# Patient Record
Sex: Male | Born: 1935
Health system: Southern US, Community
[De-identification: ages and names within clinical notes are randomized; demographics above are authoritative.]

## PROBLEM LIST (undated history)

## (undated) DIAGNOSIS — I2510 Atherosclerotic heart disease of native coronary artery without angina pectoris: Secondary | ICD-10-CM

## (undated) DIAGNOSIS — G4733 Obstructive sleep apnea (adult) (pediatric): Secondary | ICD-10-CM

## (undated) DIAGNOSIS — H3530 Unspecified macular degeneration: Secondary | ICD-10-CM

## (undated) DIAGNOSIS — S68119A Complete traumatic metacarpophalangeal amputation of unspecified finger, initial encounter: Secondary | ICD-10-CM

## (undated) DIAGNOSIS — S32409A Unspecified fracture of unspecified acetabulum, initial encounter for closed fracture: Secondary | ICD-10-CM

## (undated) DIAGNOSIS — H269 Unspecified cataract: Secondary | ICD-10-CM

## (undated) DIAGNOSIS — I10 Essential (primary) hypertension: Secondary | ICD-10-CM

## (undated) DIAGNOSIS — S329XXA Fracture of unspecified parts of lumbosacral spine and pelvis, initial encounter for closed fracture: Secondary | ICD-10-CM

## (undated) DIAGNOSIS — I4510 Unspecified right bundle-branch block: Secondary | ICD-10-CM

## (undated) DIAGNOSIS — C61 Malignant neoplasm of prostate: Secondary | ICD-10-CM

## (undated) DIAGNOSIS — E785 Hyperlipidemia, unspecified: Secondary | ICD-10-CM

## (undated) DIAGNOSIS — S060X9A Concussion with loss of consciousness of unspecified duration, initial encounter: Secondary | ICD-10-CM

## (undated) DIAGNOSIS — R911 Solitary pulmonary nodule: Secondary | ICD-10-CM

## (undated) DIAGNOSIS — S060XAA Concussion with loss of consciousness status unknown, initial encounter: Secondary | ICD-10-CM

## (undated) DIAGNOSIS — I4891 Unspecified atrial fibrillation: Secondary | ICD-10-CM

## (undated) HISTORY — DX: Unspecified macular degeneration: H35.30

## (undated) HISTORY — DX: Concussion with loss of consciousness status unknown, initial encounter: S06.0XAA

## (undated) HISTORY — PX: PROSTATECTOMY: SHX69

## (undated) HISTORY — DX: Complete traumatic metacarpophalangeal amputation of unspecified finger, initial encounter: S68.119A

## (undated) HISTORY — PX: TOOTH EXTRACTION: SUR596

## (undated) HISTORY — DX: Hyperlipidemia, unspecified: E78.5

## (undated) HISTORY — DX: Concussion with loss of consciousness of unspecified duration, initial encounter: S06.0X9A

## (undated) HISTORY — PX: OTHER SURGICAL HISTORY: SHX169

## (undated) HISTORY — DX: Malignant neoplasm of prostate: C61

## (undated) HISTORY — DX: Unspecified fracture of unspecified acetabulum, initial encounter for closed fracture: S32.409A

## (undated) HISTORY — DX: Unspecified right bundle-branch block: I45.10

## (undated) HISTORY — PX: CORONARY STENT PLACEMENT: SHX1402

## (undated) HISTORY — PX: ANTERIOR CRUCIATE LIGAMENT REPAIR: SHX115

## (undated) HISTORY — DX: Solitary pulmonary nodule: R91.1

## (undated) HISTORY — PX: TONSILLECTOMY: SUR1361

## (undated) HISTORY — DX: Obstructive sleep apnea (adult) (pediatric): G47.33

## (undated) HISTORY — DX: Unspecified cataract: H26.9

## (undated) HISTORY — DX: Fracture of unspecified parts of lumbosacral spine and pelvis, initial encounter for closed fracture: S32.9XXA

---

## 2001-09-11 ENCOUNTER — Ambulatory Visit (HOSPITAL_COMMUNITY): Admission: RE | Admit: 2001-09-11 | Discharge: 2001-09-12 | Payer: Self-pay | Admitting: Cardiology

## 2001-09-22 ENCOUNTER — Encounter (HOSPITAL_COMMUNITY): Admission: RE | Admit: 2001-09-22 | Discharge: 2001-12-21 | Payer: Self-pay | Admitting: Cardiology

## 2001-12-22 ENCOUNTER — Encounter (HOSPITAL_COMMUNITY): Admission: RE | Admit: 2001-12-22 | Discharge: 2002-01-11 | Payer: Self-pay | Admitting: Cardiology

## 2002-01-06 ENCOUNTER — Encounter (HOSPITAL_COMMUNITY): Admission: RE | Admit: 2002-01-06 | Discharge: 2002-04-06 | Payer: Self-pay | Admitting: Cardiology

## 2002-03-21 ENCOUNTER — Emergency Department (HOSPITAL_COMMUNITY): Admission: EM | Admit: 2002-03-21 | Discharge: 2002-03-21 | Payer: Self-pay | Admitting: *Deleted

## 2002-03-21 ENCOUNTER — Encounter: Payer: Self-pay | Admitting: *Deleted

## 2002-05-10 ENCOUNTER — Ambulatory Visit (HOSPITAL_COMMUNITY): Admission: RE | Admit: 2002-05-10 | Discharge: 2002-05-10 | Payer: Self-pay | Admitting: Gastroenterology

## 2002-12-06 ENCOUNTER — Encounter: Admission: RE | Admit: 2002-12-06 | Discharge: 2002-12-06 | Payer: Self-pay | Admitting: Orthopaedic Surgery

## 2002-12-06 ENCOUNTER — Encounter: Payer: Self-pay | Admitting: Orthopaedic Surgery

## 2002-12-07 ENCOUNTER — Ambulatory Visit (HOSPITAL_BASED_OUTPATIENT_CLINIC_OR_DEPARTMENT_OTHER): Admission: RE | Admit: 2002-12-07 | Discharge: 2002-12-07 | Payer: Self-pay | Admitting: Orthopaedic Surgery

## 2004-09-10 ENCOUNTER — Ambulatory Visit (HOSPITAL_COMMUNITY): Admission: RE | Admit: 2004-09-10 | Discharge: 2004-09-11 | Payer: Self-pay | Admitting: Cardiology

## 2004-09-18 ENCOUNTER — Encounter: Admission: RE | Admit: 2004-09-18 | Discharge: 2004-09-18 | Payer: Self-pay | Admitting: Cardiology

## 2005-01-11 ENCOUNTER — Emergency Department (HOSPITAL_COMMUNITY): Admission: EM | Admit: 2005-01-11 | Discharge: 2005-01-11 | Payer: Self-pay | Admitting: Emergency Medicine

## 2005-05-03 ENCOUNTER — Emergency Department (HOSPITAL_COMMUNITY): Admission: EM | Admit: 2005-05-03 | Discharge: 2005-05-03 | Payer: Self-pay | Admitting: Emergency Medicine

## 2005-05-28 ENCOUNTER — Ambulatory Visit (HOSPITAL_COMMUNITY): Admission: RE | Admit: 2005-05-28 | Discharge: 2005-05-29 | Payer: Self-pay | Admitting: Cardiology

## 2005-11-16 ENCOUNTER — Emergency Department (HOSPITAL_COMMUNITY): Admission: EM | Admit: 2005-11-16 | Discharge: 2005-11-16 | Payer: Self-pay | Admitting: Emergency Medicine

## 2007-04-09 ENCOUNTER — Ambulatory Visit (HOSPITAL_COMMUNITY): Admission: RE | Admit: 2007-04-09 | Discharge: 2007-04-10 | Payer: Self-pay | Admitting: Orthopaedic Surgery

## 2007-08-28 ENCOUNTER — Emergency Department (HOSPITAL_COMMUNITY): Admission: EM | Admit: 2007-08-28 | Discharge: 2007-08-28 | Payer: Self-pay | Admitting: Emergency Medicine

## 2007-08-31 ENCOUNTER — Encounter (HOSPITAL_COMMUNITY): Admission: RE | Admit: 2007-08-31 | Discharge: 2007-09-25 | Payer: Self-pay | Admitting: Emergency Medicine

## 2007-09-04 ENCOUNTER — Inpatient Hospital Stay (HOSPITAL_COMMUNITY): Admission: EM | Admit: 2007-09-04 | Discharge: 2007-09-17 | Payer: Self-pay | Admitting: Family Medicine

## 2007-09-12 ENCOUNTER — Ambulatory Visit: Payer: Self-pay | Admitting: Infectious Diseases

## 2007-10-14 ENCOUNTER — Inpatient Hospital Stay (HOSPITAL_COMMUNITY): Admission: RE | Admit: 2007-10-14 | Discharge: 2007-10-15 | Payer: Self-pay | Admitting: Orthopedic Surgery

## 2010-06-04 ENCOUNTER — Emergency Department (HOSPITAL_BASED_OUTPATIENT_CLINIC_OR_DEPARTMENT_OTHER)
Admission: EM | Admit: 2010-06-04 | Discharge: 2010-06-04 | Payer: Self-pay | Source: Home / Self Care | Admitting: Emergency Medicine

## 2010-10-30 NOTE — Op Note (Signed)
NAMEGABRIAN, Dominic [name] NO.:  [id number]   MEDICAL RECORD NO.:  [id number]          PATIENT TYPE:  INP   LOCATION:  5017                         FACILITY:  [facility]   PHYSICIAN:  Dionne Ano. Gramig III, M.D.DATE OF BIRTH:  [birth year]   DATE OF PROCEDURE:  09/04/2007  DATE OF DISCHARGE:                               OPERATIVE REPORT   PREOPERATIVE DIAGNOSIS:  Left index finger deep abscess with extensor  tendon sheath involvement.   POSTOPERATIVE DIAGNOSIS:  Left index finger deep abscess with extensor  tendon sheath involvement.   PROCEDURE:  1. I and D left index finger tendon sheath (extensor apparatus).  2. I and D deep abscess left index finger skin and subcutaneous      tissue, deep in location.   SURGEON:  Dionne Ano. Amanda Pea, M.D.   ASSISTANT:  None.   COMPLICATIONS:  None.   ANESTHESIA:  General.   TOURNIQUET TIME:  Less than 20 minutes.   CULTURES:  Times two.   INDICATIONS FOR PROCEDURE:  This patient is a 77 year old male who  sustained a raccoon bite 7 days ago.  It has continued to worsen in  terms of its infectious appearance.  He has been on Augmentin antibiotic  but has not returned to a quiescent state of affairs.  He is currently  undergoing a rabies series and is on the third series as per the  protocol.  He presents with an acutely infected finger and understands  the risks and benefits of surgery.  I have discussed with him all issues  in detail.   PROCEDURE IN DETAIL:  The patient was seen by myself and anesthesia,  time-out was called.  He was taken to the operative suite and underwent  a smooth induction of general anesthesia by Dr. Jairo Ben.  Following this he was prepped and draped in a sterile fashion with  Betadine scrub and paint.  Once this was performed the patient then  underwent I and D about a dorsal wound.  This was a deep abscess which  also involved the extensor apparatus,  I and D of the extensor apparatus  and extensor tendon sheath was accomplished without difficulty.  The  patient tolerated this well.  Following this the patient underwent I and  D of a separate volar ulnar type wound.  This was a deep abscess and I  and D'd this very meticulously without complicating feature.  Thus, two  separate bite wounds were I and D'd, both contained deep abscesses and  the dorsal ulnar one contained elements of the extensor sheath  apparatus.  I then irrigated with greater than 2-3 liters of saline and  packed the wounds open with Iodoform gauze.  The patient tolerated this  well.  He had excellent refill and tourniquet time was less than 20-30  minutes.  Cultures aerobic and anaerobic were taken.   We will monitor his condition closely.  We have begun IV Unasyn 3 grams  IV q.6 h. and will continue an aggressive approach to his infection.  We  will plan for daily whirlpools, IV antibiotics,  pain management and  close observatory status.  All questions have been encouraged and  answered.           ______________________________  Dionne Ano. Everlene Other, M.D.     Nash Mantis  D:  09/04/2007  T:  09/05/2007  Job:  [id number]

## 2010-10-30 NOTE — Op Note (Signed)
Dominic [name], [name] NO.:  [id number]   MEDICAL RECORD NO.:  [id number]          PATIENT TYPE:  INP   LOCATION:  5017                         FACILITY:  [facility]   PHYSICIAN:  Dionne Ano. Gramig III, M.D.DATE OF BIRTH:  04/05/1934   DATE OF PROCEDURE:  DATE OF DISCHARGE:                               OPERATIVE REPORT   PREOPERATIVE DIAGNOSIS:  Status post raccoon bite with severe infection  about the left index finger including the flexor and extensor tendon  sheaths, as well as the proximal interphalangeal joint.  Patient  presents for incision and drainage, repeat in nature.   POSTOPERATIVE DIAGNOSIS:  Status post raccoon bite with severe infection  about the left index finger including the flexor and extensor tendon  sheaths, as well as the proximal interphalangeal joint.  Patient  presents for incision and drainage, repeat in nature.   PROCEDURE:  I&D extensor and flexor tendon sheaths, as well as the PIP  joint with 6 liters of saline and loose closure.   SURGEON:  Dionne Ano. Amanda Pea, M.D.   ASSISTANT:  None.   COMPLICATIONS:  None.   ANESTHESIA:  General.   TOURNIQUET TIME:  Zero.   INDICATIONS FOR PROCEDURE:  This patient is a very pleasant gentleman  who has undergone multiple I&Ds for a raccoon bite.  This is well  detailed in his chart.  He presents for his fifth washout.  His prior  wound conditions have looked much improved, but he still has tremendous  soft tissue swelling.  I have discussed with the patient and his family  the risks, benefits of surgery, and my concern of stiffness, recurrent  infection, et Karie Soda.  With all issues in mind, we will proceed with I&D  and possible closure.   INTRAOPERATIVE FINDINGS:  This patient had stable conditions, although  he is quite swollen.  He had no purulent reaccumulation.  Over the last  two washouts, he has had no obvious purulent reaccumulation and thus he  was loosely closed over drains today.   He still remains significantly  swollen.   OPERATION IN DETAIL:  Patient seen by myself and anesthesia, taken to  the operative suite.  Underwent smooth induction of anesthesia in the  form of general anesthetic.  Permit was signed, time-out was called, and  all questions were encouraged and answered preoperatively.  Once in the  operative suite, he underwent thorough prep and drape with Betadine  scrub and paint about the left index finger and hand. A sterile field  secured.  Arm was isolated and he then underwent I&D of flexor tendon  sheath with tenosynovectomy of flexor tendon apparatus, as well as  extensor tendon tenosynovectomy.  Any nonviable, frayed tissue was  removed sharply with knife, blade and scissors.  The PIP joint was  entered and lavage.  I poured 6 liters of saline in the finger and noted  that the conditions were quite improved from prior exam.  I was pleased  with the findings and following this closed him loosely over four drip  Vesseloop drains.  He tolerated this well.  I have discussed with the patient the issues and my concerns.  His  condition looked overall stable today without any signs of purulence,  thus we loosely closed him.  However I would still give this guarded  prognosis in regards to finger stiffness, overall healing f infection,  et Karie Soda.  We are going to continue IV antibiotics, general observation  and discussed with ID possible p.o. antibiotic ridging into the future.  The patient and I have discussed these issues at length, __________, Dominic [name]. and all questions of course have been encouraged and answered.  It was a pleasure seeing him today.           ______________________________  Dionne Ano. Dominic [name], M.D.     Dominic [name]  D:  09/15/2007  T:  09/15/2007  Job:  [id number]

## 2010-10-30 NOTE — Op Note (Signed)
NAME:  Dominic [name], Dominic [name]                 ACCOUNT NO.:  [id number]   MEDICAL RECORD NO.:  [id number]          PATIENT TYPE:  AMB   LOCATION:  SDS                          FACILITY:  [facility]   PHYSICIAN:  Lubertha Basque. Dalldorf, M.D.DATE OF BIRTH:  [birth year]   DATE OF PROCEDURE:  04/09/2007  DATE OF DISCHARGE:                               OPERATIVE REPORT   PREOPERATIVE DIAGNOSIS:  Right knee anterior cruciate ligament tear.   POSTOPERATIVE DIAGNOSES:  1. Right knee anterior cruciate ligament tear.  2. Right knee torn lateral meniscus.   PROCEDURES:  1. Right ACL reconstruction.  2. Right partial lateral meniscectomy.   ANESTHESIA:  General and block.   ATTENDING SURGEON:  Lubertha Basque. Jerl Santos, M.D.   ASSISTANT:  Lindwood Qua, P.A.   INDICATIONS FOR PROCEDURE:  The patient is a very active 77 year old man  who works as an Retail banker.  He was injured on the job about a  year and a half ago and suffered an ACL rupture.  He has been treated  with bracing and physical therapy and some mild activity restriction,  but persists with giving-way episodes of the knee which affect his  ability to do his job and recreate.  He is offered a reconstruction at  this point.  Informed operative consent was obtained after discussion of  the possible complications of reaction to anesthesia, infection, and  DVT.  The patient also understood about the dedicated rehabilitation  protocol required after this type of procedure to optimize results.   SUMMARY OF FINDINGS AND PROCEDURE:  Under general anesthesia and a  block, a right knee procedure was performed.  At arthroscopy, he had  minimal degenerative change.  Patellofemoral joint appeared completely  benign.  The medial compartment showed no evidence of meniscal or  articular cartilage injury.  In terms of the lateral compartment, he  exhibited some grade II and III changes in a focal area of the lateral  femoral condyle, with a posterior horn  lateral meniscus tear addressed  with 5% partial lateral meniscectomy.  The ACL was completely torn, and  the PCL was intact.  We reconstructed the ACL using the middle third  patellar tendon allograft stabilized at both ends with 7 x 25 metal  Linvatec screws.  Bryna Colander assisted throughout and was invaluable  to completion of the case in that he helped position and retract while I  performed the procedure.  He also fashioned the allograft on the back  table while I performed the arthroscopic portions of the case, thereby  significantly minimizing O.R. time.   DESCRIPTION OF THE PROCEDURE:  The patient was taken to the operating  suite, where general anesthetic was applied without difficulty.  He was  also given a block in the preanesthesia area.  He was positioned supine  and prepped and draped in the normal sterile fashion.  After  administration of preoperative IV Kefzol, an arthroscopy of the right  knee was formed through two portals.  Findings were as noted above, and  the procedure consisted initially of the partial lateral meniscectomy  done with basket  and shavers back to stable tissues.  About 5% of the  lateral meniscus was removed in the process.  The ACL stump was removed,  followed by a conservative notchplasty performed with a bur and shaver.  The over-the-top position was well visualized.  We then placed a guide  into the knee and made a small anteromedial incision.  Utilizing this  guide, we placed a guidewire up into the knee just anterior to the PCL.  I overreamed this to diameter of 11 mm.  We then placed a second guide  through the tibial tunnel in the over-the-top position.  Utilizing this  guide, I passed the guidewire through the tibial tunnel, through the  femur, and out the proximal thigh.  I overreamed this to a diameter of  10 mm and a depth of 3 cm, with care taken to preserve a 1 or 2 mm  posterior wall, which was well visualized.  Bony debris was  removed from  the knee.  An allograft was defrosted fully on the back table and  tensioned.  This was contoured to fit through 9 and 10 mm tunnels.  A  drill hole was placed in one of these bone plugs with a wire suture  passed here.  In the other bone plug, we passed a suture through a drill  hole.  This graft was then passed through the knee through the tibial  tunnel and up into the femur.  Care was taken to keep the tendinous  surface of the graft in a posterior direction as the graft entered the  femoral tunnel.  I then placed the guidewire in an anterior position in  the femoral tunnel through the medial arthroscopic portal.  Over this, I  passed a 7 x 25 metal Linvatec screw, securing the leading bone plug in  the femoral tunnel.  I placed traction on the trailing bone plug.  It  could not dislodge the leading bone plug.  The knee ranged full, and the  graft was felt be very isometric.  The guide pin was then placed through  the tibial tunnel and seemed to enter the knee arthroscopically.  Over  this, I passed an identical interference screw to secure the trailing  bone plug in the tibia.  The knee again ranged fully.  His loose  Lachman's test with eliminated with a nice endpoint and good stability  on exam.  Arthroscopic equipment was removed.  We reapproximated the  small anterior incision with nylon.  Adaptic was applied over all the  incisions and portals and followed by dry gauze with loose Ace wrap.  Estimated blood loss and intraoperative fluids can be obtained from the  anesthesia records.  No tourniquet was utilized.   DISPOSITION:  The patient was extubated in the operating room and taken  to the recovery room in stable addition.  He was admitted for overnight  observation for pain control and probable discharge home the morning.      Lubertha Basque Jerl Santos, M.D.  Electronically Signed     PGD/MEDQ  D:  04/09/2007  T:  04/10/2007  Job:  [id number]

## 2010-10-30 NOTE — Consult Note (Signed)
NAME:  Dominic [name], Dominic [name] NO.:  [id number]   MEDICAL RECORD NO.:  [id number]          PATIENT TYPE:  INP   LOCATION:  5017                         FACILITY:  [facility]   PHYSICIAN:  Lyn Records, M.D.   DATE OF BIRTH:  07/27/1933   DATE OF CONSULTATION:  DATE OF DISCHARGE:                                 CONSULTATION   REASON FOR CONSULTATION:  Bleeding from left hand wound secondary to  dual antiplatelet therapy.   CONCLUSION/COMMENTS:  The patient is 77 years of age and has a history  of coronary artery disease.  He underwent recanalization of the totally  occluded right coronary in March 2003 and had overlapping bare-metal  stents placed in the mid portion of this vessel.  He has had restenosis  x2.  The first episode of restenosis was treated with a Cypher drug-  eluting stent in March 2006.  The second episode of restenosis was  treated with a Taxus drug-eluting stent in December 2006.  The patient  is asymptomatic with reference to any recurrent anginal symptoms.  He  has been maintained on chronic dual antiplatelet therapy because of the  long segment of the stent and the  triple layer of stents in the mid  right coronary, two of which are DES.  This places the patient at  increased risk for late stent thrombosis.  The patient did have an  episode of Plavix and aspirin  cessation for a right knee procedure in  October 2008 without complications.  He currently complains of no  cardiac symptoms.   The patient was admitted on September 04, 2007, with an infected left hand  following a bite by a rabbit raccoon.  He has undergone I&D by Dr.  Lestine Box and will require high-pressure irrigation.  This has caused  continued oozing and saturation of bandages likely secondary to the  antiplatelet effects with his dual antiplatelet therapy.   RECOMMENDATIONS:  1. Discontinue Plavix.  Resume Plavix after hemostasis and some      healing has occurred and the need for  irrigation has decreased.  2. If possible, would prefer that we continue baby aspirin 81 mg per      day; however, if this is not possible, this would also be      something that could be discontinued.  I will leave this  to Dr.      Ashok Norris discretion.  3. I will inform Dr. Mayford Knife of the patient's present.      Lyn Records, M.D.  Electronically Signed     HWS/MEDQ  D:  09/05/2007  T:  09/05/2007  Job:  [id number]   cc:   Armanda Magic, M.D.  Dionne Ano. Everlene Other, M.D.  Vianne Bulls, M.D.

## 2010-10-30 NOTE — Op Note (Signed)
Dominic [name], KRUMHOLZ NO.:  [id number]   MEDICAL RECORD NO.:  [id number]          PATIENT TYPE:  INP   LOCATION:  5017                         FACILITY:  [facility]   PHYSICIAN:  Dionne Ano. Gramig III, M.D.DATE OF BIRTH:  [birth year]   DATE OF PROCEDURE:  09/07/2007  DATE OF DISCHARGE:                               OPERATIVE REPORT   PREOPERATIVE DIAGNOSIS:  Status post raccoon bite with significant deep  sepsis.  The patient presents for repeat washout and irrigation and  debridement.   POSTOPERATIVE DIAGNOSIS:  Status post raccoon bite with significant deep  sepsis.  The patient presents for repeat washout and irrigation and  debridement.   PROCEDURE:  1. Irrigation and debridement, and arthrotomy proximal interphalangeal      joint with synovectomy and decompression.  2. Irrigation and debridement, flexor and extensor sheaths with      tenosynovectomy and debridement.  3. Irrigation and debridement, deep abscess skin, subcutaneous tissue      and ulnar digital nerve vicinity.   SURGEON:  Dionne Ano. Amanda Pea, M.D.   ASSISTANT:  None.   COMPLICATIONS:  None.   DRAINS:  Three.   TOURNIQUET TIME:  Less than 30 minutes.   CULTURES:  Aerobic and anaerobic taken.   INDICATIONS FOR PROCEDURE:  This patient is a 77 year old male who was  bitten by a raccoon.  He was I&D'd 48 to 72 hours ago.  He has continued  to have continued swelling and due to this as well as his wound  conditions.  I feel that repeat I&D as appropriate.  This was a raccoon  bite with delayed presentation.  He has been on Unasyn and was I&D'd  previously.  He still has reaccumulation of purulence and due to the  aggressiveness of the infection, I would recommend a very aggressive  approach in trying to get him to quiescence.  I have discussed with he  and his family do's and don't's, etc. and all questions have been  encouraged and answered.   OPERATIVE PROCEDURE:  The patient seen by  myself and anesthesia.  Arm  was marked, permit signed, time-out was called and he was taken to the  operative suite and underwent a smooth induction of anesthesia in the  form of general anesthetic.  He was placed supine, appropriately padded,  prepped and draped in usual sterile fashion with Betadine scrub and  paint.  Once this done, previous ulnar incision was opened.  The patient  went immediate tenolysis and tenosynovectomy of the extensor apparatus.  He had reaccumulation of purulence here in the sheath infection.   Following this I then turned attention towards the PIP joint.  I opened  this and performed a capsulectomy and synovectomy.  There was purulent  encroachment into this region and this was aggressively treated with  I&D, synovectomy and lavage.  Following this I then I&D'd a separate  area of deep abscess about the ulnar digital nerve vicinity.  The ulnar  digital nerve underwent a neurolysis and was very carefully identified  and swept out of harm's way.  This  was I&D'd at length in an aggressive  fashion.   Following this I then identified the flexor sheath, I opened this and  did not see any significant purulence in the sheath but given his  failure to respond to the initial I&D's perfectly, I wanted to check  this.   Following this, I irrigated with greater than 4 liters of solution, the  last liter being an antibiotic irrigant.  The patient tolerated this  well.  Hemostasis was satisfactory and the tourniquet was inflated for a  brief period of time (less than 30 minutes.  The drain was packed with  Iodoform gauze.  The patient had good refill and was dressed in a  sterile bandage.  Neosporin, Adaptic, drains and sterile dressing were  applied.  There no complicating features.  He was taken to recovery room  in stable condition.  We will continue general postop observation,  vancomycin, Unasyn, per protocol and an aggressive approach to his  infection.  He has  significant injury and hopefully will respond to the  above-mentioned treatment.  We need to maintain a diligent approach in  my opinion.  I have discussed this with him and his wife and all  questions of course have been encouraged and answered.           ______________________________  Dionne Ano. Everlene Other, M.D.     Nash Mantis  D:  09/07/2007  T:  09/08/2007  Job:  [id number]

## 2010-10-30 NOTE — Op Note (Signed)
NAMEJAYMAR, Dominic [name] NO.:  [id number]   MEDICAL RECORD NO.:  [id number]          PATIENT TYPE:  OIB   LOCATION:  0098                         FACILITY:  [facility]   PHYSICIAN:  Dionne Ano. Gramig III, M.D.DATE OF BIRTH:  1933/11/15   DATE OF PROCEDURE:  10/13/2007  DATE OF DISCHARGE:                               OPERATIVE REPORT   PREOPERATIVE DIAGNOSES:  Dysfunctional left index finger, status post  rabid raccoon bite with multiple irrigations and debridements and repair  reconstruction.  The patient presents for ray amputation due to  dysfunction in his finger.   POSTOPERATIVE DIAGNOSES:  Dysfunctional left index finger, status post  rabid raccoon bite with multiple irrigations and debridements and repair  reconstruction.  The patient presents for ray amputation due to  dysfunction in his finger.   PROCEDURE:  1. Ray amputation left index finger at the metacarpal level.  2. Rotation flap left hand for coverage.  3. Coaptation/nerve repair with burying technique radial and ulnar      digital nerve to the index finger.  These were sutured with nylon      suture and buried in muscle against each other to form an      anastomotic loop   SURGEON:  Dionne Ano. Amanda Pea, M.D.   ASSISTANT:  None.   COMPLICATION:  None.   ANESTHESIA:  General.   TOURNIQUET TIME:  Less than an hour.   INDICATIONS FOR PROCEDURE:  The patient is a very pleasant 77 year old  male who unfortunately sustained a rabid raccoon bite with subsequent  need for I&D repair reconstruction as noted in his chart.  The patient  presented to me 7 days out from his injury.  His dog lost his leg, and  Dominic [name] of course was trying to break up a fight between the dog and a  very aggressive supposed rabid [place].  He has completed multiple I&Ds.  He does not have active__________ infection, but he has gone on to  develop a very dysfunctional finger with poor range of motion,  chondrolysis of the PIP  joint and disarray in general.  I have discussed  with him the options for treatment, and he desires to proceed with ray  amputation to the index finger.  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  With all  issues, risks and benefits in mind, he desires to proceed.   OPERATION IN DETAIL:  The patient was seen by myself and Anesthesia.  He  was taken to the operative suite, consented.  The arm was marked.  He  was given preoperative vancomycin and following this taken to the  procedure suite.  He underwent careful prep and drape x2, followed by  insufflation of the tourniquet after general anesthetic was employed,  and he was prepped and draped of course.  Once this was done, the  patient had outline marks made for a rotation flap.  I then incised the  skin,  dissected down and identified the area in question.  All scar  tissue had a 3-5 mm rim taken sharply with knife blade from it.  In the  volar dissection, I dissected the radial and ulnar neurovascular  bundles, tagged these structures and identified the flexor tendon  apparatus.  The flexor tendon apparatus was pulled well distally,  severed and allowed to retract proximally.  I then amputated the finger  at the metacarpal level.  This was the mid metacarpal level, and I  performed an oscillating saw cut for the ray, beveled for cosmesis  purposes.  I very carefully preserved the adductor pollicis and removed  portions of the first dorsal interosseous.  The patient tolerated this  well.  Following this, the extensor apparatus was severed.  Coagulation  was obtained with bipolar cautery.  Once this done, I then deflated the  tourniquet and irrigated with greater than 3 liters of solution.  This  completed the ray amputation portion of the procedure.  I did not send  this for specimen and saw no active infection,  just a very dysfunctional  finger with chondrolysis of the PIP joint and marked attenuation of the  flexor apparatus in terms of competency due to scar tissue and edema.   Following this, I then performed burying of the radial and ulnar nerves  in the muscle.  It was placed under the adductor, and I crushed the ends  gently and then sutured them together to perform an anastomotic loop  under 4.0 loupe magnification.  This anastomotic loop was buried  underneath the muscular tissue.   Following this, I then rechecked the intermetacarpal ligament and  sutured this to the associated fascia about the interosseous with Vicryl  suture.  I then performed rotation flap and inset the flap, making sure  the patient had an excellent recreated web space.  Both volar and dorsal  limbs of the rotation flap were inset nicely and there no complicating  features.  Following this, I then placed a Hemovac drain and sterile  dressing followed by a volar plaster splint.  The patient tolerated this  well.  Bleeding was secured nicely and there no complicating features.  Twenty mL of Sensorcaine without epinephrine was used for postop  analgesia.  The patient was taken to the recovery room.  He will be on  IV antibiotics in the form of vancomycin.  Will ask for elevation,  gentle interval vascular checks and all full precautions.  I have  discussed with him the relevant do's and don'ts, etc..  We will plan for  a compressive dressing for 10-14 days, followed by wound check in the  office once he is discharged of course.  All questions have been  aggressive.  It was a pleasure to see him today.           ______________________________  Dionne Ano. Everlene Other, M.D.     Nash Mantis  D:  10/13/2007  T:  10/13/2007  Job:  [id number]

## 2010-10-30 NOTE — Op Note (Signed)
NAMEARRINGTON, YOHE NO.:  [id number]   MEDICAL RECORD NO.:  [id number]          PATIENT TYPE:  INP   LOCATION:  5017                         FACILITY:  [facility]   PHYSICIAN:  Dionne Ano. Gramig III, M.D.DATE OF BIRTH:  1933/10/10   DATE OF PROCEDURE:  09/12/2007  DATE OF DISCHARGE:                               OPERATIVE REPORT   PREOPERATIVE DIAGNOSIS:  Infection left index finger secondary to  raccoon bite, with continued ongoing infectious process.  Presents for  repeat irrigation and debridement.   POSTOPERATIVE DIAGNOSIS:  Infection left index finger secondary to  raccoon bite, with continued ongoing infectious process.  Presents for  repeat irrigation and debridement.   PROCEDURES:  1. Irrigation and debridement flexor tendon sheath, extensive in      nature, with tenosynovectomy.  2. Irrigation and debridement proximal interphalangeal joint left      index finger.  3. Irrigation and debridement extensor tendon sheath, extensive in      nature, left index finger.   SURGEON:  Dionne Ano. Amanda Pea, M.D.   ASSISTANT:  None.   COMPLICATIONS:  None.   ANESTHESIA:  General.   TOURNIQUET TIME:  Less than 20 minutes.   INDICATIONS FOR THE PROCEDURE:  This patient is a 77 year old male who  presents with the above-mentioned diagnosis.  He is had previous  multiple I&Ds and has been on Unasyn and vancomycin and has had  aggressive treatment in regards to his upper extremity predicament.  He  unfortunately still continued to have a very significant infectious  process despite all aggressive efforts, including aggressive  antibiotics, whirlpools, and multiple I&D.  I have discussed with the  patient his findings.  The patient's original injury occurred as he was  breaking up a fight between a raccoon and his [place].  His [place]  ended up losing its leg and hindquarter.  In addition to this, the  patient has had a very difficult time clearing his infection  of course.  I have discussed with them that an aggressive purchase is still  warranted, and I would absolutely recommend very aggressive measures.  He presents for repeat I&D, understanding and accepting the risks and  benefits surgery and understands that amputation is an option if he  fails to make any progress.   I have gone over all issues with him and the family.  They are well  aware of my concerns and the guarded prognosis.   OPERATIVE PROCEDURE:  The patient was seen by myself and anesthesia.  He  was taken to the operative suite, time-out was called, and permit  signed.  Questions were encouraged and answered.  He was satisfactorily  padded and underwent a general anesthetic.  The patient had the arm  prepped and draped.  Following this, I performed an I&D of the area in  question and then made a proximal incision, as it appears the infection  had been ascending somewhat.  I opened up the volar aspect of the finger  down to the A1 pulley, and skin flaps were elevated.  I took great care  to  preserve the radial neurovascular bundle.  The ulnar digital nerve  underwent a neurolysis, and the ulnar digital artery had a thrombosis in  it from the infection which of course presented to my service 7 days  into the process.  The patient had no advanced purulence ascending into  the sheath of the palm, but did have exuberant flexor tenosynovitis.  Any nonviable prenecrotic tissue was debrided sharply.  I performed an  irrigation and debridement of the flexor tendon sheath, with greater  than 3 liters of fluid.  Following this, I turned towards the PIP joint  and performed an arthrotomy with once again I&D (irrigation and  debridement) of the proximal interphalangeal joint.  This was an  excisional debridement.  Following this, I&D of the extensor tendon  apparatus was accomplished.  This was an irrigation and debridement of  the extensor tendon sheath.  I did not see advanced  accumulation of a  purulence, and I did see marked swelling and tenosynovitis about the  tissues as well as synovitis about the joint.  All of these tissues were  cultured for aerobic, anaerobic, atypical, and fungal species.  An  additional 3-4 liters of saline was passed through the finger and, in  all, approximately 7 liters were placed.  The patient tolerated this  well, and there were no complicating features.   Following the I&D, I then packed the wound open with saline-soaked  gauze.  The patient tolerated this well.  The saline-soaked gauze was  followed by Neosporin, Adaptic, Xeroform, gauze, Webril, and a volar  plaster splint.  He had adequate refill, and no complicating features.   We will continue an aggressive course of care.  I have discussed with  him the dos and don'ts, etc., and had very long conversations with him  and his family in regards to the upper extremity predicament.  It was a  pleasure to participate in his care.  We will continue ongoing  aggressive efforts.  However, at the present time, I would give him a  very guarded prognosis.  It appears that this may have been a rabid  raccoon, given the aggressiveness of the attack on his dog and the very  poor response to treatment to date, despite very aggressive efforts.  We  will continue ongoing aggressive efforts.  The patient understands this.  I have consulted Dr. Lina Sayre from infectious disease in regards to  the infection as well, I should note.           ______________________________  Dionne Ano. Everlene Other, M.D.     Nash Mantis  D:  09/13/2007  T:  09/14/2007  Job:  [id number]

## 2010-10-30 NOTE — Op Note (Signed)
Dominic [name], ARMITAGE NO.:  [id number]   MEDICAL RECORD NO.:  [id number]          PATIENT TYPE:  INP   LOCATION:  5017                         FACILITY:  [facility]   PHYSICIAN:  Dionne Ano. Gramig III, M.D.DATE OF BIRTH:  1933/10/24   DATE OF PROCEDURE:  DATE OF DISCHARGE:                               OPERATIVE REPORT   PREOPERATIVE DIAGNOSIS:  Status post raccoon bite of left hand index  finger with previous multiple irrigations and debridements.  The patient  presents for repeat irrigation and debridement and washout.   POSTOPERATIVE DIAGNOSIS:  Status post raccoon bite of left hand index  finger with previous multiple irrigations and debridements.  The patient  presents for repeat irrigation and debridement and washout.   PROCEDURES:  1. Irrigation and debridement, left index finger extensor tendon      apparatus.  2. Incision and debridement, left proximal interphalangeal joint.  3. Incision and debridement, flexor tendon sheath including the flexor      digitorum profundus and flexor digitorum superficialis tendons,      with tenolysis.   SURGEON:  Dionne Ano. Amanda Pea, M.D.   ASSISTANT:  None.   COMPLICATIONS:  None.   ANESTHESIA:  General.   TOURNIQUET TIME:  Zero.   DRAINS:  The patient was packed open.   INDICATIONS FOR PROCEDURE:  The patient is a 78 year old male who has  undergone previous multiple I&D's of his infectious process.  He  presented 7 days into his injury with a raccoon bite.  He has undergone  I&D's over the previous week and presents for a repeat evaluation and  I&D, understanding and accepting the risks and benefits of surgery.  His  prognosis is guarded given the very severe significance of his  infection.  He understands this.   OPERATIVE PROCEDURE IN DETAIL:  The patient was seen by myself and  anesthesia.  He was taken to the operating room and underwent smooth  induction of general anesthesia.  Following this, he was  prepped and  draped in the usual sterile fashion with Betadine scrub and paint.  A  time-out was called, the permit was signed, all questions were  encouraged answered, of course.  I&D of skin, subcutaneous tissue and  extensor tendon sheath apparatus was accomplished.  Any nonviable,  prenecrotic tissue was removed.  Following this the PIP joint was  entered and similar irrigation and debridement of nonviable prenecrotic  tissue was accomplished.  I then placed 3 L through this area of saline.  Following this, I turned attention towards the flexor tendon apparatus,  which was previously opened, and the FDP and FDS tendons underwent a  tenolysis, tenosynovectomy retraction check.  The A2 pulley was  preserved, of course, and 3 L of saline was flushed through this region.  The ulnar digital nerve was intact, the radial neurovascular bundle was  intact and the ulnar artery was previously thrombosed from his  infectious sequelae.  I then placed another 2 L of saline through the  area and packed the area open.   Overall, the patient had improved conditions; however,  I would still  give him a guarded prognosis.  We are going to watch closely, see how he  does over the next 48 hours, and return him to the operative suite for a  repeat I&D, possible loose closure, possible open intervention depending  on the infectious sequelae.  He is currently on vancomycin and Zosyn.  He has an aggressive approach planned towards his infection, as he has  had all along over the last 8 days of his hospitalization.  However, at  present time I would zero give him a guarded prognosis in regards to his  injury.   It is an absolute pleasure to see him today.  I have discussed all  issues with him and his wife and will continue aggressive efforts.           ______________________________  Dionne Ano. Everlene Other, M.D.     Nash Mantis  D:  09/13/2007  T:  09/14/2007  Job:  [id number]

## 2010-11-02 NOTE — Discharge Summary (Signed)
[name], HUSTED NO.:  [id number]   MEDICAL RECORD NO.:  [id number]          PATIENT TYPE:  INP   LOCATION:  5017                         FACILITY:  [facility]   PHYSICIAN:  Karie Chimera, P.A.-C.DATE OF BIRTH:  11-24-1933   DATE OF ADMISSION:  09/04/2007  DATE OF DISCHARGE:  09/17/2007                               DISCHARGE SUMMARY   ADMITTING DIAGNOSES:  1. Left index finger infection secondary to probable rabid raccoon      bite.  2. History of coronary artery disease with stent placement and chronic      anticoagulation.  3. History of hyperlipidemia.  4. History of hypertension.  5. History of prostate cancer.   DISCHARGE DIAGNOSES:  1. Left index finger infection secondary to probable rabid raccoon      bite, guarded but improved.  2. History of coronary artery disease with stent placement and chronic      anticoagulation.  3. History of hyperlipidemia.  4. History of hypertension.  5. History of prostate cancer.   SURGEON:  Dionne Ano. Gramig, MD.   PROCEDURES:  Incision and drainage of the skin, subcutaneous tissue both  volar and dorsal as well about the flexor sheath left index finger for a  total of 5 and separate Incision and drainage performed.   CONSULTS:  Cardiology, Dr. Armanda Magic as well as Infectious Disease,  Dr. Maurice March.   BRIEF HISTORY OF PRESENT ILLNESS:  Mr. Dominic [name] is a very pleasant 77-year-  old gentleman who presented to the [facility] Emergency Room for  worsening swelling, erythema, pain, and drainage about the left index  finger.  He sustained a raccoon bite to the left index finger  approximately 1 week ago while trying to stop the raccoon from attacking  his anterior toe.  He initially saw his primary care physician who  started him on antibiotic regime of Augmentin 875 b.i.d., was  subsequently seen 3 additional times with worsening findings about the  finger in terms of swelling, erythema, and signs of ascending  cellulitis, presents to the emergency room concerning for evaluation and  upper extremity surgery.  Surgeon [name] was consulted for  evaluation and take over care.  The patient was noted to have  significant erythema, swelling, and drainage about the left index finger  consistent with a deep abscess/infective process.  The patient had  previously started the rabies vaccination by his primary care physician  given the poor response to p.o. antibiotics and worsening symptoms,  decision made to her urgently take him to the OR for I&D of the finger  and hand as necessary.  All issues were discussed with the patient in  terms of his infective process.  Certainly this had been somewhat  resistant from the initial injury and thus his state of his index finger  was certainly guarded.  Given his history of heart disease and chronic  anticoagulation, Dr. Norris Cross office was contacted for medical clearance  prior to surgical intervention.  Preoperative laboratory data was  obtained.  He was stable per Cardiology.  His EKG showed sinus  bradycardia, right bundle branch block, no significant change since last  tracing was noted.  No obvious bony abnormalities were noted about the  radiographs of the index finger.  The patient was noted to be stable and  blood pressure 154/89, pulse 57, respirations 18, and temperature 98.  After thorough and thoughtful discussion with the patient, he was  admitted to undergo operative intervention for the finger as well as IV  antibiotic implementation.   HOSPITAL COURSE:  Mr. [name] was admitted on September 04, 2007, and had a  lengthy hospital stay until 09/17/2007 given extremely poor response to  IV antibiotics and repeated I&D totaling 5 formal separate I&D's about  the index finger.  His I&D's were without complications.  Please see  operative report for full details.  Intraoperative cultures were  obtained with no organisms, shown certainly these were  most likely  skewed given his 1 week prior use of p.o. antibiotics.  The patient did  fairly well postoperatively without complications.  Cardiology followed  him along closely and his Plavix and aspirin was stopped throughout his  hospital stay.  The patient was closely watched in terms of his wound.  He had waxing and waning of improvement about the index finger in terms  of erythema, drainage, and signs of infection, and again underwent five  separate I&D's while inpatient.  He was initially started on IV Unasyn;  however, given the poor wound response, he was switched to vancomycin  for prophylactic coverage.  Infectious Disease was consulted who agreed  with the of vancomycin and Unasyn coverage.  They followed along closely  with the patient.  Cultures showed no growth.  The patient underwent  daily wound care in the form of hydrotherapy and packing.  He did have  some slight improvement towards the last of his stay and on the day this  discharge he was noted to be fairly improved overall.  He still had  swelling, but no signs of reaccumulating deep abscess.  His wound had no  purulent discharge.  He had no ascending erythema.  His vital signs  remained stable.  He had intermittent fevers with a T-max being 100.8  during his hospital stay.  Laboratory data was monitored carefully.  He  was noted to have a stable WBC count of 7.1.  He did have a slight drop  in his hematocrit at 35.8.  Per his description, did have a history of  chronic anemia.  Per Infectious Disease recommendation, given the  delayed response to IV antibiotics and multiple I&D, recommended  outpatient IV vancomycin.  The PICC line was placed inpatient without  difficulties and he tolerated this well.  On 09/17/2007, the patient's  wound had improved overall and the decision was made to discharge him  home.  He will of course continue outpatient IV vancomycin for his PICC  line advanced.  Home care was contacted for  wound care services as well  as vancomycin placement.  We discussed at length with Mr. Collymore upon  discharge, the somewhat guarded nature given the resistance to IV Unasyn  and vancomycin the infective process had shown to this point.  In  addition we discussed with him the potential need for amputation in the  form of a ray amputation with the infective process.  Given the fact  that after a significant infective process, he may certainly be left  with a stiff painful index finger.  All questions were encouraged and  answered and decision made  to discharge him home.  Unasyn was replaced  with Zosyn per Infectious Disease for more complete coverage.   ASSESSMENT/FINAL DIAGNOSES:  1. Incision and drainage x5 secondary to left index finger infections      status post raccoon bite.  2. History of coronary artery disease with stent placement and chronic      anticoagulation.  3. History of hyperlipidemia.  4. History of hypertension.  5. History of prostate cancer.   PLAN/CONDITION ON DISCHARGE:  Improved, but guarded.   DIET:  Regular.   ACTIVITIES:  1. He will undergo daily wound care.  2. He will resume his Plavix and aspirin on Saturday.  3. Wound care and home therapy and dressing change will be performed      per [facility] home [facility].  He will need IV vancomycin outpatiently      dose as determined by pharmacy equaling to 2000 mg IV q. 24.  4. He will need to keep his wound clean, dry, intact, elevate      frequently.   DISCHARGE MEDICATIONS:  1  His regular home medications resumed.  1. Percocet 1-2 q.4-6 p.r.n. pain.  2. Peri-Colace 100 mg p.o. b.i.d.  3. He will follow up with Dr. Amanda Pea on 09/21/2007.  He will call      [phone] if he has any questions or concerns.      Karie Chimera, P.A.-C.     BB/MEDQ  D:  10/22/2007  T:  10/23/2007  Job:  [id number]

## 2010-11-02 NOTE — Cardiovascular Report (Signed)
NAME:  Dominic [name], Dominic [name] NO.:  [id number]   MEDICAL RECORD NO.:  [id number]          PATIENT TYPE:  OIB   LOCATION:  6532                         FACILITY:  [facility]   PHYSICIAN:  Lyn Records III, M.D.DATE OF BIRTH:  [birth year]   DATE OF PROCEDURE:  09/10/2004  DATE OF DISCHARGE:                              CARDIAC CATHETERIZATION   INDICATIONS:  In-stent restenosis, abnormal Cardiolite, recurring chest  pressure.   PROCEDURE PERFORMED:  Angioplasty and overlapping drug-eluting stent  implantation throughout a region of diffuse in-stent restenosis.   DESCRIPTION:  After informed consent, the sheath already placed by Dr.  Mayford Knife during the diagnostic procedure was used. It was a 6-French sheath.  Patient was given 75 units/kg of IV heparin and an ACT was documented to be  greater than 300.  We then used an Asahi medium wire and had minimal  difficulty crossing a long area of restenosis within the right coronary.  We  then gave a double bolus followed by an infusion of Integrilin, 300 mg of  oral Plavix and performed balloon angioplasty using a noncompliant 3.0 mm  Quantum balloon throughout the mid portion of the previously stented region  which was approximately 58 mm based on the prior records.  After adequately  preparing the vessel with pre dilatation we deployed a 33 mm long x 3.0 mm  diameter CYPHER stent that extended from the beginning of the distal portion  of the right coronary to the mid right coronary.  Two balloon inflations up  to 14 atmospheres were performed.  We then overlapped this more proximally  with a 3.5 x 33 mm CYPHER stent overlapping in the mid vessel to the  proximal vessel proximal prior stent margin.  Two balloon inflations were  performed to 14 atmospheres with brisk TIMI grade 3 flow noted and a very  nice angiographic result.   AngioSeal arteriotomy closure was performed after angiographic demonstration  of adequate access  entry.   CONCLUSION:  1.  Diffuse in-stent restenosis treated with redilatation and overlapping 33      mm long CYPHER drug-eluting stents from the distal vessel back to the      proximal vessel overlapping and extending beyond the prior stent      margins.  There was reduction of stenosis from 100% with TIMI grade 0      flow to 0% with TIMI grade 3 flow.  2.  Successful Angio-Seal arteriotomy closure.   PLAN:  Aspirin.  Plavix for at least 12 months.  I would probably let the  patient remain on Plavix indefinitely, but definitely longer than 12 months.  Further management per Dr. Mayford Knife.      HWS/MEDQ  D:  09/10/2004  T:  09/10/2004  Job:  [id number]   cc:   A. Tenny Craw, M.D.

## 2010-11-02 NOTE — Cardiovascular Report (Signed)
NAMEDEJAUN, Dominic [name] NO.:  [id number]   MEDICAL RECORD NO.:  [id number]          PATIENT TYPE:  OIB   LOCATION:  2899                         FACILITY:  [facility]   PHYSICIAN:  Armanda Magic, M.D.     DATE OF BIRTH:  Aug 07, 1933   DATE OF PROCEDURE:  09/10/2004  DATE OF DISCHARGE:                              CARDIAC CATHETERIZATION   REFERRING PHYSICIAN:  Dr. Duane Lope.   PROCEDURE:  1.  Left heart catheterization.  2.  Coronary angiography.  3.  Left ventriculography.   OPERATOR:  Armanda Magic, M.D.   INDICATIONS:  Chest pain, abnormal Cardiolite, coronary artery disease.   COMPLICATIONS:  None.   IV ACCESS:  Via right femoral artery 6-French sheath.   This is a very pleasant 77 year old white male with a previous history of  coronary disease status post PTCA stenting of the right coronary artery who  presented with some vague chest pain.  Underwent stress Cardiolite study  which showed a reversible defect in the inferior wall now presents for  cardiac catheterization.   The patient was brought to the cardiac catheterization laboratory in a  fasting nonsedated state.  Informed consent was obtained.  The patient was  connected to continuous heart rate and pulse oximetry monitoring,  intermittent blood pressure monitoring.  The right groin was prepped and  draped in sterile fashion.  1% Xylocaine was used for local anesthesia.  Using the modified Seldinger technique a 6-French sheath was placed in the  right femoral artery.  Under fluoroscopic guidance a 6-French JL4 catheter  was placed in the left coronary artery.  Multiple cineangiographic films  were taken in a 30 RAO and 40 degree LAO views.  This catheter was then  exchanged out over guidewire for 6-French JR4 catheter which was placed  under fluoroscopic guidance in the right coronary artery.  Multiple  cineangiographic films taken in a 30 RAO and 40 degree LAO views.  This  catheter was then  exchanged out over a guidewire for a 6-French angled  pigtail catheter which was placed under fluoroscopic guidance in the left  ventricular cavity.  Left ventriculography was performed in the 30 degree  RAO view using total of 30 mL of contrast at 15 mL per second.  The catheter  was then pulled back across the aortic valve with no significant gradient  noted.  The catheter was then removed over a guidewire.  At the end  procedure the patient went on to PCI of the RCA by Dr. Katrinka Blazing.   RESULTS:  Left main coronary artery has a 20% distal narrowing before  bifurcating to left circumflex and left anterior descending artery.  Left  anterior descending artery is widely patent throughout its proximal portion  giving rise to a first diagonal branch which is widely patent.  Between the  first and second diagonal branches there is a 50-60% mid stenosis of the  LAD.  The second diagonal has an 80% ostial stenosis and bifurcates into two  daughter branches both of which are widely patent.  The ongoing left  coronary artery is  widely patent.  The left circumflex is widely patent  throughout its course and gives rise to a very large first obtuse marginal  branch which trifurcates into three vessels all of which are widely patent.  The left circumflex then gives rise to second obtuse marginal branch which  is widely patent.  The ongoing circumflex is widely patent throughout its  course in the AV groove.  There is evidence of left-to-right collaterals  feeding the distal RCA.   The right coronary artery shows a stent extending from the proximal portion  to the distal RCA.  In the mid portion of the stent it appears subtotaled.  There is evidence of right to right collaterals filling the distal RCA.   Left ventriculography shows a small area of inferior basal akinesis,  otherwise normal LV function.  EF 55-60%.  There is mild MR.  LV pressure  128/12 mmHg.  Aortic pressure 128/60 mmHg.    ASSESSMENT:  1.  Two-vessel obstructive coronary artery disease with in-stent restenosis      of the right coronary artery and borderline left anterior descending      obstruction of 50-60% and 80% diagonal #2.  2.  Overall normal left ventricular systolic function with inferior basal      akinesis.  3.  Hypertension.  4.  Dyslipidemia.   PLAN:  PCI of the RCA, aspirin and Plavix and check a fasting lipid panel       TT/MEDQ  D:  09/10/2004  T:  09/10/2004  Job:  [id number]   cc:   A. Tenny Craw, M.D.

## 2010-11-02 NOTE — Cardiovascular Report (Signed)
NAME:  Dominic [name], Dominic [name] NO.:  [id number]   MEDICAL RECORD NO.:  [id number]          PATIENT TYPE:  OIB   LOCATION:  6532                         FACILITY:  [facility]   PHYSICIAN:  Armanda Magic, M.D.     DATE OF BIRTH:  04-Nov-1933   DATE OF PROCEDURE:  05/28/2005  DATE OF DISCHARGE:  05/29/2005                              CARDIAC CATHETERIZATION   REFERRING PHYSICIAN:  Dr. Duane Lope.   PROCEDURE:  Left heart catheterization, coronary angiography, left  ventriculography.   OPERATOR:  Armanda Magic, M.D.   INDICATIONS:  Chest pain, abnormal Cardiolite.   COMPLICATIONS:  None.   IV ACCESS:  Via right femoral artery 6-French sheath.   IV CONTRAST:  150 cc.   This is a very pleasant 77 year old white male with a history of coronary  disease status post PTCA stenting of the proximal mid-RCA in March2003 who  presented with in-stent restenosis of the RCA on September 10, 2004 and  underwent overlapping Cypher stent placement. He now presents with recurrent  chest pain and stress Cardiolite study showing an inferior basal reversible  defect consistent with inducible ischemia and now presents for cardiac  catheterization.   The patient was brought to cardiac catheterization laboratory in a fasting  nonsedated state. Informed consent was obtained. The patient was connected  to continuous heart rate, pulse oximetry monitoring and intermittent blood  pressure monitoring. The right groin was prepped and draped in a sterile  fashion. 1% Xylocaine was used for local anesthesia. Using modified  Seldinger technique, a 6-French sheath was placed in the right femoral  artery. Under fluoroscopic guidance, a 6-French JL-4 catheter was placed  left coronary artery. Multiple cine films were taken at 30 degree RAO and 40  degree LAO views. This catheter was then exchanged out over a guidewire for  6-French angled pigtail catheter which was placed under fluoroscopic  guidance in the left  ventricular cavity. Left ventriculography was performed  in the 30 degree RAO view and a total of  30 cc of contrast at 50 cc per  second. The catheter was then pulled back across the aortic valve with no  significant gradient noted. At the end of procedure, the catheter was  removed over a guide wire and the sheath left intact for review of films by  Dr. Corliss Marcus and possible PCI of the RCA.   RESULTS:  1.  Left main coronary artery is widely patent and bifurcates in the left      anterior descending artery and left circumflex artery. Left anterior      descending artery gives rise to a first diagonal branch and then between      the first and second diagonal branches of the 50-60% narrowing of the      left anterior descending artery. The ongoing left anterior descending      artery is widely patent throughout the course the apex. The second      diagonal has a 60-70% ostial stenosis.  2.  The left circumflex is widely patent proximally and gives rise to a  first obtuse marginal branch which was extremely large. It does have      luminal irregularities and trifurcates into three daughter branches      which are widely patent. Left circumflex then has a 30% narrowing and it      gives rise to a second obtuse marginal branch which is widely patent.      The ongoing circumflex traverses the AV groove and is widely patent.  3.  The right coronary artery has evidence of a stent from the ostium down      to the distal portion of the RCA. There was a 99% napkin ring lesion in      the midportion of the RCA. Distally it bifurcates into posterior      descending artery and posterior lateral artery both of which are widely      patent. There does appear to be some competitive flow in the posterior      descending artery.  4.  Left ventriculography shows normal LV systolic function 65%. LV pressure      was 155/7 mmHg. Aortic pressure 160/78 mmHg.   ASSESSMENT:  1.  Two-vessel  obstructive coronary disease with in-stent restenosis in the      right coronary artery and borderline left anterior descending artery      obstruction of 50-60% in the diagonal.  The 2nd diagona has a stenosis      of 60-70%.  2.  Overall normal left ventricular function.  3.  Hypertension.  4.  Dyslipidemia.  5.  Stable lung nodule consistent with granuloma by chest CT.   PLAN:  PCI to RCA per Dr. Amil Amen. CHAMPION study enrollment and continue  aspirin increased to 325 milligrams a day.      Armanda Magic, M.D.  Electronically Signed     TT/MEDQ  D:  05/28/2005  T:  05/29/2005  Job:  [id number]   cc:   Duane Lope, M.D.

## 2010-11-02 NOTE — Cardiovascular Report (Signed)
[place]  Patient:    Dominic [name], Dominic [name] Visit Number: [id number] MRN: [id number]          Service Type: CAT Location: 6500 [id number] [id number] Attending Physician:  Armanda Magic Dictated by:   Francisca December, M.D. Proc. Date: 09/11/01 Admit Date:  09/11/2001 Discharge Date: 09/12/2001   CC:         Armanda Magic, M.D.  Miguel Aschoff, M.D.  Cardiac Catheterization Laboratory   Cardiac Catheterization  PROCEDURES PERFORMED: Percutaneous coronary intervention/stent implantation, proximal and mid right coronary artery.  INDICATIONS: The patient is a 77 year old man who presented with a three to four month history of intermittent chest tightness, not necessarily exertional. He underwent a myocardial perfusion study with pharmacologic stress that showed a reversible inferior defect. Dr. Armanda Magic has completed coronary angiography revealing a complete occlusion of the right coronary artery with antegrade and retrograde collateral fill, indicating the occlusion is probably about 1 cm in length. An attempt will be made at this time (subsequently successful) to reopen the artery and provide for stent implantation.  DESCRIPTION OF PROCEDURE: Via the previously placed 6 French catheter sheath, a 6 [place] guiding catheter FR4 was advanced to the ascending aorta where the right coronary os was engaged. This was a Theme park manager, 6 [place] guiding catheter. A 0.014 inch ACS Guidant XT-100 intracoronary guide wire was introduced into the proximal portion of the right coronary. We relatively little difficulty, I was able to cross the complete occlusion and position the distal portion of the wire in the distal right coronary. The patient had already received 4800 units of heparin intravenously and his ACT was 286 seconds. At that point, a double bolus of Integrilin was ordered and administered, as well as a constant infusion. A 2.5 x 30 mm ACS Guidant, CrossSail  intracoronary balloon was introduced in the proximal portion of the right coronary and inflated to 8 atmospheres for 1 minute. This was withdrawn and coronary angiography performed. This revealed reasonable patency of the proximal right coronary, but there was a 75-80% diffuse stenosis in the midportion. The CrossSail balloon was reintroduced to the right coronary and inflated in the midportion to a maximum pressure of 8 atmospheres for a maximum duration of 1 minute. This device was removed and a 3.0/20 mm Scimed, Express intracoronary stent was advanced into the midportion of the right coronary. It was deployed there to a maximum pressure of 15 atmospheres for 1 minute. This stent balloon was removed and cineangiography repeated. I had hoped to perform spot stenting in the proximal and midportion of the right coronary with an intervening segment of about 2.5 cm. However, there was a some intimal dissection in this segment; therefore, I decided to fully stent from the junction of the mid and distal segment to near the ostium of the right coronary. Therefore, a 3.0/38 mm ACS, Guidant, Zeta intracoronary stent was chosen, and this was advanced into the right coronary such that the distal end of the stent was 1 mm within the proximal end of the previously placed stent. This extended then to near the ostium of the right coronary. The stent was inflated to maximum pressure of 15 atmospheres for 1 minute. This resulted in the wide patency of the right coronary and good antegrade TIMI-3 flow. The angiography was repeated in orthogonal views both with and without the guide wire in place. This confirmed wide patency of the right coronary. The guide wire was then removed as well as the guiding  catheter. The sheath was sutured into place and the patient was transported to the recovery area in stable condition with intact distal pulse. Closing ACT was 249 seconds.  ANGIOGRAPHY: As mentioned, the  lesion treated was in the proximal mid right coronary, and it was 100% occluded prior to angioplasty. Following initially balloon dilatation, there was a residual 50% stenosis in the proximal segment and a 75-80% stenosis in the midportion. Following stent implantation, there was no residual stenosis within the proximal and midportion of the right coronary.  FINAL IMPRESSION: 1. Atherosclerotic coronary vascular disease, single-vessel. 2. Status post successful percutaneous transluminal coronary angioplasty    stent implantation, proximal and mid right coronary. 3. Typical angina was not reproduced with device insertion or balloon    inflation. Dictated by:   Francisca December, M.D. Attending Physician:  Armanda Magic DD:  09/11/01 TD:  09/12/01 Job: [id number]

## 2010-11-02 NOTE — Op Note (Signed)
NAME:  Dominic [name], Dominic [name] NO.:  [id number]   MEDICAL RECORD NO.:  [id number]                   PATIENT TYPE:  AMB   LOCATION:  ENDO                                 FACILITY:  [facility]   PHYSICIAN:  Petra Kuba, M.D.                 DATE OF BIRTH:  09/30/1933   DATE OF PROCEDURE:  05/10/2002  DATE OF DISCHARGE:                                 OPERATIVE REPORT   PROCEDURE:  Colonoscopy.   INDICATIONS:  Screening, questionable family history of colon cancer.  Personal history of prostate cancer.  Consent was signed after risks,  benefits, methods, and options thoroughly discussed in the past.   MEDICATIONS:  Demerol 50 mg, Versed 6 mg.   DESCRIPTION OF PROCEDURE:  Rectal inspection was pertinent for external  hemorrhoids.  Digital exam was negative.  The regular video colonoscope was  inserted and with some difficulty due to some looping was able to be  advanced to the cecum.  This did require rolling him on his back and some  abdominal pressure.  The cecum was then identified by the appendiceal  orifice and the ileocecal valve.  No obvious abnormality was seen on  insertion.  The prep was fairly adequate, did require lots of washing and  suctioning for adequate visualization.  On slow withdrawal in the mid-  ascending a questionable tiny hyperplastic-appearing polyp was seen;  however, with trying to biopsy it we clogged the scope a few times with  washing and then once we were able to unclog the suction, we could no longer  find the polyp.  Based on its insignificant appearance, we elected to slowly  withdraw.  On slow withdrawal through the colon, lots of washing and  suctioning were done, and we continued to reclog the scope, but no obvious  other abnormalities were seen as we slowly withdrew back to the rectum.  Once back in the rectum the scope was retroflexed, pertinent for some  internal hemorrhoids.  The scope was straightened and readvanced a  short way  up the left side of the colon, the air was suctioned, and scope removed.  The patient tolerated the procedure adequately.  There was no obvious  immediate complication.   ENDOSCOPIC DIAGNOSES:  1. Internal-external hemorrhoids.  2. Questionable tiny ascending polyp that was hyperplastic-appearing, unable     to biopsy due to unable to find due to spasm, cleaning, washing, etc.  3. Otherwise within normal limits to the cecum.    PLAN:  Yearly rectals and guaiacs per Dr. Tenny Craw.  Happy to see back p.r.n.  Otherwise, if doing well medically repeat screening in five years.  Petra Kuba, M.D.    MEM/MEDQ  D:  05/10/2002  T:  05/10/2002  Job:  [id number]   cc:   C. Duane Lope, M.D.  [address]  [place]  [place] [address]  Fax: [phone]   [place] [employer], M.D.  [address]  [place]  [place] [address]  Fax: [phone]

## 2010-11-02 NOTE — Cardiovascular Report (Signed)
NAME:  Dominic [name], Dominic [name] NO.:  [id number]   MEDICAL RECORD NO.:  [id number]          PATIENT TYPE:  OIB   LOCATION:  6532                         FACILITY:  [facility]   PHYSICIAN:  Francisca December, M.D.  DATE OF BIRTH:  03-17-1934   DATE OF PROCEDURE:  05/28/2005  DATE OF DISCHARGE:  05/29/2005                              CARDIAC CATHETERIZATION   PROCEDURES PERFORMED:  1.  PCI slash drug-eluting stent implantation mid-right coronary artery.  2.  Intravascular ultrasound.  3.  Percutaneous closure right femoral artery.   INDICATIONS:  Recurrent angina, abnormal Cardiolite, restenosis mid-RCA,  long stented segment.   PROCEDURE NOTE:  Via the previously placed 6-French catheter sheath, a 6-  [place] #4 FR guiding catheter was advanced in the descending aorta where  left coronary os was engaged. The patient received a therapeutic bolus of  Angiomax with a resultant ACT of 328 seconds. He has been on chronic Plavix  as well as aspirin.  A 0.014 inch Luge was used to cross the lesion without  difficulty. Initial balloon dilatation was performed with a 2.5/15 mm  Quantum Maverick intracoronary balloon inflated to 8 atmospheres. This was  deflated removed and 3.5/12 mm Scimed Taxus intracoronary drug-eluting stent  was advanced into place carefully positioned over the rather focal mid-right  coronary restenotic lesion and inflated to a peak pressure of 14 atmospheres  for approximately one minute. This balloon was deflated removed and a 3.75/9  mm monorail [place] balloon dilating catheter was advanced into place. This was  inflated twice within the stented segment to 16 atmospheres. I then  performed intravascular ultrasound using the Atlantis catheter. A single  mechanical pullback was performed and the images were analyzed. There was  still some residual stenosis with significant calcification over  approximately 180 degrees of the lumen in the focal point of the restenotic  lesion. Therefore 4.0/9 mm monorail [place] was advanced in to place and inflated  to 14 atmospheres for approximately one minute. This was deflated and  removed. Vascular ultrasound was repeated and found to have a satisfactory  result. The guiding catheter and wire were removed. A right femoral  arteriogram performed in the RAO angulation using hand injection via the  catheter sheath documented adequate anatomy for placement percutaneous  closure device AngioSeal. This was completed successfully with good  hemostasis and an intact distal pulse.   ANGIOGRAPHY:  As mentioned, the lesion treated was in the midportion of the  long stented segment of the right coronary. This was a previously placed  Cypher stent inside of a previously placed bare-metal stent. The lesion was  approximately 5-6 mm in length and 95-99% stenotic. Following balloon  dilatation, stent implantation and intravascular ultrasound, there was no  residual stenosis. In fact there was a widening of the lumen within the  triple stented segment. Intravascular ultrasound final lumen diameter was  3.1 x 3.2 mm.   FINAL IMPRESSION:  1.  Status post successful percutaneous coronary intervention/drug-eluting      stent implantation mid-right coronary, third device later inserted.  2.  Typical angina was  not reproduced with device insertion or balloon      inflation.      Francisca December, M.D.  Electronically Signed     JHE/MEDQ  D:  05/28/2005  T:  05/29/2005  Job:  [id number]

## 2010-11-02 NOTE — Op Note (Signed)
NAME:  Dominic [name], WERT NO.:  [id number]   MEDICAL RECORD NO.:  [id number]                   PATIENT TYPE:  AMB   LOCATION:  DSC                                  FACILITY:  [facility]   PHYSICIAN:  Lubertha Basque. Jerl Santos, M.D.             DATE OF BIRTH:  21-Mar-1934   DATE OF PROCEDURE:  12/07/2002  DATE OF DISCHARGE:                                 OPERATIVE REPORT   PREOPERATIVE DIAGNOSES:  1. Right knee torn lateral meniscus.  2. Right knee degenerative joint disease.   POSTOPERATIVE DIAGNOSES:  1. Right knee torn lateral meniscus.  2. Right knee degenerative joint disease.   OPERATION PERFORMED:  1. Right knee partial lateral meniscectomy.  2. Right knee chondroplasty, lateral and patellofemoral compartment.   SURGEON:  Lubertha Basque. Jerl Santos, M.D.   ASSISTANT:  Prince Rome, P.A.   ANESTHESIA:  Knee block, MAC.   INDICATIONS FOR PROCEDURE:  The patient is a 77 year old man who has had  trouble with his knees since a work related accident last year.  This has  persisted despite oral anti-inflammatories and rest.  He has been left with  pain with activity and pain at rest and is offered an arthroscopy.  Informed  operative consent was obtained after discussion of possible complications of  reaction to anesthesia and infection.   DESCRIPTION OF PROCEDURE:  The patient was taken to the operating suite  where a knee block was applied without difficulty.  He was also given some  sedation.  He was positioned in supine position and prepped and draped in  the normal sterile fashion.  After administration of preop intravenous  antibiotics, an arthroscopy of the right knee was performed through two  inferior portals.  Suprapatellar pouch was benign while patellofemoral joint  exhibited some focal grade 2 change on the inferior pole addressed with a  brief chondroplasty.  The knee cap did track well.  The medial compartment  was completely benign with no  evidence of meniscal or articular cartilage  injury.  The ACL and the PCL were intact. In the lateral compartment, he had  a small degenerative tear of the middle horn of the lateral meniscus  addressed with a tiny partial lateral meniscectomy.  He also had some grade  3 change on the lateral femoral condyle addressed with a thorough  chondroplasty.  He had fairly poor articular cartilage on the lateral  femoral condyle that certainly did not seem to be very healthy but no bone  was exposed.  The knee was thoroughly irrigated at the end of the case  followed by placement of Marcaine with epinephrine and morphine.  Adaptic  was placed over the portals followed by dry gauze and a loose Ace wrap.  Estimated blood loss and intraoperative fluids as well as accurate  tourniquet time can be obtained from anesthesia records.    DISPOSITION:  The patient was  taken to the recovery room in stable  condition.  Plans were for the patient to go home the same day and to follow  up in the office in less than a week.  I will contact him by phone tonight.                                                 Lubertha Basque Jerl Santos, M.D.    PGD/MEDQ  D:  12/07/2002  T:  12/07/2002  Job:  [id number]

## 2010-11-02 NOTE — Cardiovascular Report (Signed)
[facility]  Patient:    Dominic [name], Dominic [name] Visit Number: [id number] MRN: [id number]          Service Type: CAT Location: [id number] Attending Physician:  Armanda Magic Dictated by:   Armanda Magic, M.D. Proc. Date: 09/11/01 Admit Date:  09/11/2001 Discharge Date: 09/12/2001   CC:         Miguel Aschoff, M.D.   Cardiac Catheterization  REFERRING PHYSICIAN: Miguel Aschoff, M.D.  CHIEF COMPLAINT: Chest pressure and abnormal Cardiolite.  PROCEDURES PERFORMED: Left heart catheterization, coronary angiography, left ventriculography.  INDICATIONS: Chest pain, abnormal Cardiolite.  COMPLICATIONS: None.  INTERVENOUS ACCESS: Via right femoral artery, 6 French sheath.  HISTORY OF PRESENT ILLNESS: This is a 77 year old white male with a history of remote tobacco use and hyperlipidemia, who has been recently having some problems with chest discomfort as well as shoulder discomfort. He has had several episodes of chest tightness over the past couple of months usually on exertion. He underwent exercise treadmill testing and went to stage IV but he did not have any symptoms on the treadmill, but there were T wave changes in the inferior leads and subsequently went on to adenosine Cardiolite study, which showed decreased perfusion in the inferior base consistent with possible ischemia. There was complete reperfusion on resting images. He now presents for cardiac catheterization.  DESCRIPTION OF PROCEDURE: The patient is brought to the cardiac catheterization laboratory in the fasting, nonsedated state.  Informed consent was obtained.  The patient was connected to continuous heart rate and pulse oximetry monitoring, and intermittent blood pressure monitoring. The right groin was prepped and draped in a sterile fashion.  Lidocaine 1% was used for local anesthesia.  Using the modified Seldinger technique, a 6 French sheath was placed in the right femoral artery.   Under fluoroscopic guidance, a 6 French Judkins JL4 catheter was placed in the left coronary artery. Multiple cine films were taken in a 30-degree RAO, 40-degree LAO views. This catheter was then exchanged out for a 6 [place] JR4 catheter, which was placed in the right coronary artery. Multiple cine films were taken in a 30-degree RAO, 40-degree LAO views. This catheter was then exchanged out over a wire and a 6 French straight pigtail catheter which was placed under fluoroscopic guidance into the left ventricular cavity. Left ventriculography was performed in a 30-degree RAO view using a total of 30 cc of contrast at 13 cc/sec. The catheter was then pulled back across the aortic valve with no significant gradient.  RESULTS: 1. The left main coronary artery was widely patent and bifurcates into a    left anterior descending artery and left circumflex artery. 2. The left anterior descending artery is widely patent throughout its    course and gives rise to two large diagonal branches, both of which are    widely patent. 3. Left circumflex is widely patent throughout its course, gives rise to one    very large obtuse marginal branch, which gives off a branch at the    takeoff with a 50% proximal narrowing. This is a very small vessel. The    rest of the obtuse marginal trifurcates distally and is widely patent. 4. Right coronary artery is proximally occluded with very faint filling    antegrade filling. There is evidence of left to right collaterals    filling the distal right coronary artery.  LEFT VENTRICULOGRAM: Left ventriculography performed in a 30-degree RAO view and 20-degree LAO views. The RAO view using  a total of 30 cc of contrast at 13 cc/sec. and the RAO view using a total of 20 cc of contrast at 12 cc/sec. showed normal EF at 55%. There was inferobasal akinesis. Left ventricular pressure was 136/27 mmHg, aortic pressure 134/54 mmHg.  ASSESSMENT: 1. One-vessel obstructive  coronary disease. 2. Normal ejection fraction with inferobasal akinesis.  PLAN: PCI per Dr. Amil Amen. Continue Toprol-XL 25 mg a day. Continue aspirin q.d. Dictated by:   Armanda Magic, M.D. Attending Physician:  Armanda Magic DD:  09/11/01 TD:  09/12/01 Job: [id number]

## 2011-01-09 ENCOUNTER — Encounter (INDEPENDENT_AMBULATORY_CARE_PROVIDER_SITE_OTHER): Payer: BC Managed Care – PPO | Admitting: Ophthalmology

## 2011-01-09 DIAGNOSIS — H35379 Puckering of macula, unspecified eye: Secondary | ICD-10-CM

## 2011-01-09 DIAGNOSIS — H35039 Hypertensive retinopathy, unspecified eye: Secondary | ICD-10-CM

## 2011-01-09 DIAGNOSIS — H3530 Unspecified macular degeneration: Secondary | ICD-10-CM

## 2011-01-09 DIAGNOSIS — H353290 Exudative age-related macular degeneration, unspecified eye, stage unspecified: Secondary | ICD-10-CM

## 2011-01-21 ENCOUNTER — Ambulatory Visit: Payer: Medicare Other | Attending: Obstetrics and Gynecology | Admitting: Physical Therapy

## 2011-01-21 DIAGNOSIS — R279 Unspecified lack of coordination: Secondary | ICD-10-CM | POA: Insufficient documentation

## 2011-01-21 DIAGNOSIS — IMO0001 Reserved for inherently not codable concepts without codable children: Secondary | ICD-10-CM | POA: Insufficient documentation

## 2011-01-21 DIAGNOSIS — R269 Unspecified abnormalities of gait and mobility: Secondary | ICD-10-CM | POA: Insufficient documentation

## 2011-01-29 ENCOUNTER — Ambulatory Visit: Payer: Medicare Other | Admitting: Physical Therapy

## 2011-02-07 ENCOUNTER — Ambulatory Visit: Payer: Medicare Other | Admitting: Physical Therapy

## 2011-02-07 ENCOUNTER — Ambulatory Visit: Payer: BC Managed Care – PPO | Admitting: Physical Therapy

## 2011-02-12 ENCOUNTER — Encounter: Payer: BC Managed Care – PPO | Admitting: Physical Therapy

## 2011-02-25 ENCOUNTER — Encounter (INDEPENDENT_AMBULATORY_CARE_PROVIDER_SITE_OTHER): Payer: BC Managed Care – PPO | Admitting: Ophthalmology

## 2011-02-25 DIAGNOSIS — H3530 Unspecified macular degeneration: Secondary | ICD-10-CM

## 2011-02-25 DIAGNOSIS — H43819 Vitreous degeneration, unspecified eye: Secondary | ICD-10-CM

## 2011-02-25 DIAGNOSIS — H353290 Exudative age-related macular degeneration, unspecified eye, stage unspecified: Secondary | ICD-10-CM

## 2011-03-11 LAB — BASIC METABOLIC PANEL
BUN: 10
BUN: 9
CO2: 24
CO2: 25
Chloride: 107
Chloride: 108
Chloride: 109
Creatinine, Ser: 0.94
Creatinine, Ser: 0.95
GFR calc Af Amer: >60
GFR calc non Af Amer: >60
GFR calc non Af Amer: >60
Glucose, Bld: 105 — ABNORMAL HIGH
Glucose, Bld: 114 — ABNORMAL HIGH
Glucose, Bld: 136 — ABNORMAL HIGH
Potassium: 4.1
Potassium: 4.2
Sodium: 136
Sodium: 136
Sodium: 138

## 2011-03-11 LAB — ANAEROBIC CULTURE

## 2011-03-11 LAB — CULTURE, ROUTINE-ABSCESS: Culture: NO GROWTH

## 2011-03-11 LAB — CBC
HCT: 36.2 — ABNORMAL LOW
HCT: 38 — ABNORMAL LOW
HCT: 40.3
HCT: 41.5
Hemoglobin: 12.4 — ABNORMAL LOW
Hemoglobin: 13.8
Hemoglobin: 14
Hemoglobin: 15.6
MCHC: 33.8
MCHC: 34.3
MCV: 84
MCV: 84.7
MCV: 85.2
MCV: 85.7
Platelets: 154
Platelets: 187
RBC: 5.33
RDW: 13.3
WBC: 6.4
WBC: 8.6

## 2011-03-11 LAB — DIFFERENTIAL
Basophils Absolute: 0
Basophils Absolute: 0
Basophils Absolute: 0
Basophils Relative: 0
Basophils Relative: 0
Eosinophils Absolute: 0.2
Eosinophils Relative: 1
Eosinophils Relative: 3
Lymphocytes Relative: 12
Lymphocytes Relative: 14
Lymphocytes Relative: 15
Lymphocytes Relative: 15
Lymphs Abs: 1
Monocytes Absolute: 0.8
Monocytes Absolute: 1.1 — ABNORMAL HIGH
Monocytes Relative: 11
Neutro Abs: 4.4
Neutro Abs: 5.6
Neutro Abs: 7.9 — ABNORMAL HIGH
Neutrophils Relative %: 69

## 2011-03-11 LAB — VANCOMYCIN, TROUGH: Vancomycin Tr: 9.2

## 2011-03-11 LAB — WOUND CULTURE

## 2011-03-11 LAB — FUNGUS CULTURE W SMEAR

## 2011-03-11 LAB — AFB CULTURE WITH SMEAR (NOT AT ARMC): Acid Fast Smear: NONE SEEN

## 2011-03-12 LAB — COMPREHENSIVE METABOLIC PANEL
ALT: 51
BUN: 21
Calcium: 9.8
Glucose, Bld: 97
Sodium: 141
Total Protein: 7

## 2011-03-12 LAB — BASIC METABOLIC PANEL
BUN: 12
CO2: 26
Chloride: 102
Creatinine, Ser: 1.05
Glucose, Bld: 101 — ABNORMAL HIGH
Potassium: 4

## 2011-03-12 LAB — DIFFERENTIAL
Basophils Absolute: 0
Eosinophils Absolute: 0.2
Eosinophils Relative: 4
Lymphocytes Relative: 13
Lymphs Abs: 1.1
Monocytes Absolute: 0.8
Monocytes Relative: 9
Neutro Abs: 5.8
Neutrophils Relative %: 74

## 2011-03-12 LAB — CBC
HCT: 35.8 — ABNORMAL LOW
Hemoglobin: 14.2
MCHC: 33.9
MCHC: 31.9
MCV: 84.5
Platelets: 273
Platelets: 221
RDW: 13.5
RDW: 14.3
WBC: 7.1

## 2011-03-27 LAB — BASIC METABOLIC PANEL
CO2: 26
Glucose, Bld: 99
Potassium: 3.7
Sodium: 136

## 2011-03-27 LAB — CBC
HCT: 43.3
Hemoglobin: 14.8
MCHC: 34.2
RDW: 13.5

## 2011-04-15 ENCOUNTER — Encounter (INDEPENDENT_AMBULATORY_CARE_PROVIDER_SITE_OTHER): Payer: BC Managed Care – PPO | Admitting: Ophthalmology

## 2011-06-03 ENCOUNTER — Encounter (INDEPENDENT_AMBULATORY_CARE_PROVIDER_SITE_OTHER): Payer: BC Managed Care – PPO | Admitting: Ophthalmology

## 2011-06-03 DIAGNOSIS — H43819 Vitreous degeneration, unspecified eye: Secondary | ICD-10-CM

## 2011-06-03 DIAGNOSIS — H2510 Age-related nuclear cataract, unspecified eye: Secondary | ICD-10-CM

## 2011-06-03 DIAGNOSIS — H353290 Exudative age-related macular degeneration, unspecified eye, stage unspecified: Secondary | ICD-10-CM

## 2011-06-03 DIAGNOSIS — H3530 Unspecified macular degeneration: Secondary | ICD-10-CM

## 2011-07-05 DIAGNOSIS — R269 Unspecified abnormalities of gait and mobility: Secondary | ICD-10-CM | POA: Diagnosis not present

## 2011-07-05 DIAGNOSIS — G63 Polyneuropathy in diseases classified elsewhere: Secondary | ICD-10-CM | POA: Diagnosis not present

## 2011-07-05 DIAGNOSIS — R413 Other amnesia: Secondary | ICD-10-CM | POA: Diagnosis not present

## 2011-07-10 DIAGNOSIS — F4323 Adjustment disorder with mixed anxiety and depressed mood: Secondary | ICD-10-CM | POA: Diagnosis not present

## 2011-07-11 DIAGNOSIS — R269 Unspecified abnormalities of gait and mobility: Secondary | ICD-10-CM | POA: Diagnosis not present

## 2011-07-11 DIAGNOSIS — G63 Polyneuropathy in diseases classified elsewhere: Secondary | ICD-10-CM | POA: Diagnosis not present

## 2011-07-18 DIAGNOSIS — G63 Polyneuropathy in diseases classified elsewhere: Secondary | ICD-10-CM | POA: Diagnosis not present

## 2011-07-18 DIAGNOSIS — R413 Other amnesia: Secondary | ICD-10-CM | POA: Diagnosis not present

## 2011-07-18 DIAGNOSIS — R269 Unspecified abnormalities of gait and mobility: Secondary | ICD-10-CM | POA: Diagnosis not present

## 2011-07-22 ENCOUNTER — Encounter (INDEPENDENT_AMBULATORY_CARE_PROVIDER_SITE_OTHER): Payer: BC Managed Care – PPO | Admitting: Ophthalmology

## 2011-07-22 DIAGNOSIS — H353290 Exudative age-related macular degeneration, unspecified eye, stage unspecified: Secondary | ICD-10-CM

## 2011-07-22 DIAGNOSIS — H2510 Age-related nuclear cataract, unspecified eye: Secondary | ICD-10-CM

## 2011-07-22 DIAGNOSIS — H3530 Unspecified macular degeneration: Secondary | ICD-10-CM

## 2011-07-22 DIAGNOSIS — H43819 Vitreous degeneration, unspecified eye: Secondary | ICD-10-CM | POA: Diagnosis not present

## 2011-07-24 DIAGNOSIS — F4323 Adjustment disorder with mixed anxiety and depressed mood: Secondary | ICD-10-CM | POA: Diagnosis not present

## 2011-08-14 DIAGNOSIS — F4323 Adjustment disorder with mixed anxiety and depressed mood: Secondary | ICD-10-CM | POA: Diagnosis not present

## 2011-08-14 DIAGNOSIS — E7800 Pure hypercholesterolemia, unspecified: Secondary | ICD-10-CM | POA: Diagnosis not present

## 2011-08-14 DIAGNOSIS — Z79899 Other long term (current) drug therapy: Secondary | ICD-10-CM | POA: Diagnosis not present

## 2011-09-09 ENCOUNTER — Encounter (INDEPENDENT_AMBULATORY_CARE_PROVIDER_SITE_OTHER): Payer: Medicare Other | Admitting: Ophthalmology

## 2011-09-09 DIAGNOSIS — H3530 Unspecified macular degeneration: Secondary | ICD-10-CM

## 2011-09-09 DIAGNOSIS — H35039 Hypertensive retinopathy, unspecified eye: Secondary | ICD-10-CM | POA: Diagnosis not present

## 2011-09-09 DIAGNOSIS — I10 Essential (primary) hypertension: Secondary | ICD-10-CM

## 2011-09-09 DIAGNOSIS — H43819 Vitreous degeneration, unspecified eye: Secondary | ICD-10-CM

## 2011-09-09 DIAGNOSIS — H353290 Exudative age-related macular degeneration, unspecified eye, stage unspecified: Secondary | ICD-10-CM | POA: Diagnosis not present

## 2011-09-09 DIAGNOSIS — H2510 Age-related nuclear cataract, unspecified eye: Secondary | ICD-10-CM

## 2011-09-18 DIAGNOSIS — F4323 Adjustment disorder with mixed anxiety and depressed mood: Secondary | ICD-10-CM | POA: Diagnosis not present

## 2011-09-23 DIAGNOSIS — H532 Diplopia: Secondary | ICD-10-CM | POA: Diagnosis not present

## 2011-10-02 DIAGNOSIS — G63 Polyneuropathy in diseases classified elsewhere: Secondary | ICD-10-CM | POA: Diagnosis not present

## 2011-10-02 DIAGNOSIS — R269 Unspecified abnormalities of gait and mobility: Secondary | ICD-10-CM | POA: Diagnosis not present

## 2011-10-02 DIAGNOSIS — R413 Other amnesia: Secondary | ICD-10-CM | POA: Diagnosis not present

## 2011-10-16 DIAGNOSIS — F4323 Adjustment disorder with mixed anxiety and depressed mood: Secondary | ICD-10-CM | POA: Diagnosis not present

## 2011-10-21 DIAGNOSIS — I2510 Atherosclerotic heart disease of native coronary artery without angina pectoris: Secondary | ICD-10-CM | POA: Diagnosis not present

## 2011-10-21 DIAGNOSIS — E7800 Pure hypercholesterolemia, unspecified: Secondary | ICD-10-CM | POA: Diagnosis not present

## 2011-10-21 DIAGNOSIS — Z79899 Other long term (current) drug therapy: Secondary | ICD-10-CM | POA: Diagnosis not present

## 2011-10-21 DIAGNOSIS — I10 Essential (primary) hypertension: Secondary | ICD-10-CM | POA: Diagnosis not present

## 2011-10-21 DIAGNOSIS — G4733 Obstructive sleep apnea (adult) (pediatric): Secondary | ICD-10-CM | POA: Diagnosis not present

## 2011-11-04 ENCOUNTER — Encounter (INDEPENDENT_AMBULATORY_CARE_PROVIDER_SITE_OTHER): Payer: Medicare Other | Admitting: Ophthalmology

## 2011-11-04 DIAGNOSIS — H353290 Exudative age-related macular degeneration, unspecified eye, stage unspecified: Secondary | ICD-10-CM

## 2011-11-04 DIAGNOSIS — H35039 Hypertensive retinopathy, unspecified eye: Secondary | ICD-10-CM | POA: Diagnosis not present

## 2011-11-04 DIAGNOSIS — H43819 Vitreous degeneration, unspecified eye: Secondary | ICD-10-CM | POA: Diagnosis not present

## 2011-11-04 DIAGNOSIS — H3530 Unspecified macular degeneration: Secondary | ICD-10-CM

## 2011-11-04 DIAGNOSIS — H2510 Age-related nuclear cataract, unspecified eye: Secondary | ICD-10-CM

## 2011-11-06 DIAGNOSIS — F4323 Adjustment disorder with mixed anxiety and depressed mood: Secondary | ICD-10-CM | POA: Diagnosis not present

## 2011-11-21 DIAGNOSIS — R279 Unspecified lack of coordination: Secondary | ICD-10-CM | POA: Diagnosis not present

## 2011-11-21 DIAGNOSIS — I10 Essential (primary) hypertension: Secondary | ICD-10-CM | POA: Diagnosis not present

## 2011-11-21 DIAGNOSIS — E7800 Pure hypercholesterolemia, unspecified: Secondary | ICD-10-CM | POA: Diagnosis not present

## 2011-11-21 DIAGNOSIS — I2510 Atherosclerotic heart disease of native coronary artery without angina pectoris: Secondary | ICD-10-CM | POA: Diagnosis not present

## 2011-12-11 DIAGNOSIS — F4323 Adjustment disorder with mixed anxiety and depressed mood: Secondary | ICD-10-CM | POA: Diagnosis not present

## 2011-12-16 DIAGNOSIS — H4011X0 Primary open-angle glaucoma, stage unspecified: Secondary | ICD-10-CM | POA: Diagnosis not present

## 2011-12-18 ENCOUNTER — Encounter (INDEPENDENT_AMBULATORY_CARE_PROVIDER_SITE_OTHER): Payer: BC Managed Care – PPO | Admitting: Ophthalmology

## 2011-12-18 DIAGNOSIS — H353290 Exudative age-related macular degeneration, unspecified eye, stage unspecified: Secondary | ICD-10-CM

## 2011-12-18 DIAGNOSIS — H2510 Age-related nuclear cataract, unspecified eye: Secondary | ICD-10-CM | POA: Diagnosis not present

## 2011-12-18 DIAGNOSIS — H43819 Vitreous degeneration, unspecified eye: Secondary | ICD-10-CM | POA: Diagnosis not present

## 2011-12-18 DIAGNOSIS — H3530 Unspecified macular degeneration: Secondary | ICD-10-CM

## 2011-12-28 ENCOUNTER — Emergency Department (HOSPITAL_BASED_OUTPATIENT_CLINIC_OR_DEPARTMENT_OTHER): Payer: Medicare Other

## 2011-12-28 ENCOUNTER — Encounter (HOSPITAL_BASED_OUTPATIENT_CLINIC_OR_DEPARTMENT_OTHER): Payer: Self-pay | Admitting: *Deleted

## 2011-12-28 ENCOUNTER — Emergency Department (HOSPITAL_BASED_OUTPATIENT_CLINIC_OR_DEPARTMENT_OTHER)
Admission: EM | Admit: 2011-12-28 | Discharge: 2011-12-28 | Disposition: A | Discharge status: Home / Self Care | Payer: Medicare Other | Attending: Emergency Medicine | Admitting: Emergency Medicine

## 2011-12-28 DIAGNOSIS — M659 Synovitis and tenosynovitis, unspecified: Secondary | ICD-10-CM

## 2011-12-28 DIAGNOSIS — I2510 Atherosclerotic heart disease of native coronary artery without angina pectoris: Secondary | ICD-10-CM | POA: Insufficient documentation

## 2011-12-28 DIAGNOSIS — I10 Essential (primary) hypertension: Secondary | ICD-10-CM | POA: Insufficient documentation

## 2011-12-28 DIAGNOSIS — M19039 Primary osteoarthritis, unspecified wrist: Secondary | ICD-10-CM | POA: Diagnosis not present

## 2011-12-28 DIAGNOSIS — M25439 Effusion, unspecified wrist: Secondary | ICD-10-CM | POA: Diagnosis not present

## 2011-12-28 DIAGNOSIS — M65839 Other synovitis and tenosynovitis, unspecified forearm: Secondary | ICD-10-CM | POA: Insufficient documentation

## 2011-12-28 DIAGNOSIS — Z79899 Other long term (current) drug therapy: Secondary | ICD-10-CM | POA: Insufficient documentation

## 2011-12-28 DIAGNOSIS — M25539 Pain in unspecified wrist: Secondary | ICD-10-CM | POA: Diagnosis not present

## 2011-12-28 DIAGNOSIS — M65849 Other synovitis and tenosynovitis, unspecified hand: Secondary | ICD-10-CM | POA: Diagnosis not present

## 2011-12-28 HISTORY — DX: Atherosclerotic heart disease of native coronary artery without angina pectoris: I25.10

## 2011-12-28 HISTORY — DX: Essential (primary) hypertension: I10

## 2011-12-28 NOTE — ED Notes (Signed)
Pt c/o right wrist pain and swelling for a few days. No hx of injury.

## 2011-12-28 NOTE — ED Notes (Signed)
Pt presents to ED today with swelling and pain to right wrist.  Pt reports no obvious trauma and no obvious deformity.

## 2011-12-28 NOTE — ED Provider Notes (Signed)
History   This chart was scribed for Gerhard Munch, MD by Shari Heritage. The patient was seen in room MH05/MH05. Patient's care was started at 2027.     CSN: [id number]  Arrival date & time 12/28/11  2027   First MD Initiated Contact with Patient 12/28/11 2043      Chief Complaint  Patient presents with  . Wrist Pain    (Consider location/radiation/quality/duration/timing/severity/associated sxs/prior treatment) Patient is a 78 y.o. male presenting with wrist pain. The history is provided by the patient. No language interpreter was used.  Wrist Pain This is a new problem. The current episode started more than 2 days ago. The problem occurs constantly. The problem has not changed since onset.Pertinent negatives include no chest pain, no abdominal pain, no headaches and no shortness of breath. Nothing aggravates the symptoms. Nothing relieves the symptoms. He has tried nothing for the symptoms.   Dominic [name] is a 78 y.o. male who presents to the Emergency Department complaining of constant, moderate to severe right wrist pain with associated swelling onset several days ago. Patient says that his wrist appears red and is warm to touch. Patient denies loss of sensation in his fingers. Also denies fever, chills, nausea, vomiting, abdominal pain, chest pain, diarrhea, and back pain. Patient says that he works for [employer]. Patient denies history of injury. Patient with medical h/o CAD, glaucoma, HTN, bilateral macular degeneration. Surgical h/o coronary stent placement. P  Radial tenderness  Past Medical History  Diagnosis Date  . Coronary artery disease   . Hypertension     Past Surgical History  Procedure Date  . Coronary stent placement     History reviewed. No pertinent family history.  History  Substance Use Topics  . Smoking status: Never Smoker   . Smokeless tobacco: Not on file  . Alcohol Use: No      Review of Systems  Constitutional: Negative for fever and  chills.  HENT: Negative for neck pain.   Eyes: Negative for visual disturbance.  Respiratory: Negative for shortness of breath.   Cardiovascular: Negative for chest pain.  Gastrointestinal: Negative for abdominal pain.  Genitourinary: Negative for frequency.  Skin: Negative for rash.  Neurological: Negative for headaches.  Psychiatric/Behavioral: The patient is not nervous/anxious.     Allergies  Review of patient's allergies indicates no known allergies.  Home Medications   Current Outpatient Rx  Name Route Sig Dispense Refill  . BETA CAROTENE PO Oral Take 1 tablet by mouth daily.    Marland Kitchen BRIMONIDINE TARTRATE 0.1 % OP SOLN Both Eyes Place 1 drop into both eyes 2 (two) times daily.    Marland Kitchen CLOPIDOGREL BISULFATE 75 MG PO TABS Oral Take 75 mg by mouth daily.    . CO Q-10 PO Oral Take 1 tablet by mouth daily.    . LUTEIN-ZEAXANTHIN PO Oral Take 1 tablet by mouth daily.    Marland Kitchen METOPROLOL SUCCINATE ER 25 MG PO TB24 Oral Take 75 mg by mouth daily.    . ADULT MULTIVITAMIN W/MINERALS CH Oral Take 1 tablet by mouth daily.    Marland Kitchen ZINC PO Oral Take 1 tablet by mouth 2 (two) times daily. Maxi 2    . NITROGLYCERIN 0.4 MG SL SUBL Sublingual Place 0.4 mg under the tongue every 5 (five) minutes as needed. For chest pain    . FISH OIL PO Oral Take 2 capsules by mouth 2 (two) times daily.    Marland Kitchen OVER THE COUNTER MEDICATION Oral Take 1 tablet by  mouth daily. Piracetam supplement for memory      BP 160/67  Pulse 56  Temp 97.6 F (36.4 C) (Oral)  Resp 20  Ht 6' (1.829 m)  Wt 210 lb (95.255 kg)  BMI 28.48 kg/m2  SpO2 98%  Physical Exam  Nursing note and vitals reviewed. Constitutional: He is oriented to person, place, and time. He appears well-developed. No distress.  HENT:  Head: Normocephalic and atraumatic.  Eyes: Conjunctivae and EOM are normal.  Cardiovascular: Normal rate and regular rhythm.   Pulmonary/Chest: Effort normal. No stridor. No respiratory distress.  Abdominal: He exhibits no  distension.  Musculoskeletal: He exhibits no edema.       Right wrist: He exhibits tenderness (Radial aspect of wrist) and swelling.       Good grip strength in right hand. Good wrist flexion, extension. Good right wrist adduction.  Neurological: He is alert and oriented to person, place, and time.  Skin: Skin is warm and dry.  Psychiatric: He has a normal mood and affect.    ED Course  Procedures (including critical care time) DIAGNOSTIC STUDIES: Oxygen Saturation is 98% on room air, normal by my interpretation.    COORDINATION OF CARE: 9:11PM- Patient informed of current plan for treatment and evaluation and agrees with plan at this time. Have ordered X-ray of right wrist. Patient presents with wrist tendonitis. Suggested that patient apply ice and a wrist immobilizer to reduce aggravating movement. Patient should follow up with orthopedist.    Labs Reviewed - No data to display  No results found.   No diagnosis found.    MDM  I personally performed the services described in this documentation, which was scribed in my presence. The recorded information has been reviewed and considered.  This pleasant elderly male presents with days of irritation about the right wrist.  On exam the patient is in no distress, though he has tenderness to palpation about the radial aspect of the wrist, particularly with mild palpation and abduction.  The patient's exam is otherwise unremarkable aside from a missing digit on each hand.  The patient's x-ray demonstrates degenerative changes, but no other acute findings.  The patient's presentation is most consistent with de Quervain's synovitis.  I discussed this finding, with the patient and his wife.  The patient was discharged with a wrist immobilizer, home care instructions, orthopedics followup.  Gerhard Munch, MD 12/28/11 2249

## 2011-12-30 ENCOUNTER — Encounter (INDEPENDENT_AMBULATORY_CARE_PROVIDER_SITE_OTHER): Payer: Medicare Other | Admitting: Ophthalmology

## 2011-12-31 DIAGNOSIS — M545 Low back pain, unspecified: Secondary | ICD-10-CM | POA: Diagnosis not present

## 2011-12-31 DIAGNOSIS — M999 Biomechanical lesion, unspecified: Secondary | ICD-10-CM | POA: Diagnosis not present

## 2012-01-01 DIAGNOSIS — F4323 Adjustment disorder with mixed anxiety and depressed mood: Secondary | ICD-10-CM | POA: Diagnosis not present

## 2012-01-01 DIAGNOSIS — M999 Biomechanical lesion, unspecified: Secondary | ICD-10-CM | POA: Diagnosis not present

## 2012-01-01 DIAGNOSIS — M545 Low back pain, unspecified: Secondary | ICD-10-CM | POA: Diagnosis not present

## 2012-01-02 DIAGNOSIS — M545 Low back pain, unspecified: Secondary | ICD-10-CM | POA: Diagnosis not present

## 2012-01-02 DIAGNOSIS — M999 Biomechanical lesion, unspecified: Secondary | ICD-10-CM | POA: Diagnosis not present

## 2012-01-13 ENCOUNTER — Encounter (INDEPENDENT_AMBULATORY_CARE_PROVIDER_SITE_OTHER): Payer: BC Managed Care – PPO | Admitting: Ophthalmology

## 2012-01-13 DIAGNOSIS — H43819 Vitreous degeneration, unspecified eye: Secondary | ICD-10-CM | POA: Diagnosis not present

## 2012-01-13 DIAGNOSIS — M999 Biomechanical lesion, unspecified: Secondary | ICD-10-CM | POA: Diagnosis not present

## 2012-01-13 DIAGNOSIS — H3530 Unspecified macular degeneration: Secondary | ICD-10-CM

## 2012-01-13 DIAGNOSIS — H35039 Hypertensive retinopathy, unspecified eye: Secondary | ICD-10-CM | POA: Diagnosis not present

## 2012-01-13 DIAGNOSIS — H2510 Age-related nuclear cataract, unspecified eye: Secondary | ICD-10-CM

## 2012-01-13 DIAGNOSIS — M545 Low back pain, unspecified: Secondary | ICD-10-CM | POA: Diagnosis not present

## 2012-01-13 DIAGNOSIS — H353290 Exudative age-related macular degeneration, unspecified eye, stage unspecified: Secondary | ICD-10-CM

## 2012-01-13 DIAGNOSIS — I10 Essential (primary) hypertension: Secondary | ICD-10-CM

## 2012-01-14 DIAGNOSIS — M545 Low back pain, unspecified: Secondary | ICD-10-CM | POA: Diagnosis not present

## 2012-01-14 DIAGNOSIS — M999 Biomechanical lesion, unspecified: Secondary | ICD-10-CM | POA: Diagnosis not present

## 2012-01-15 DIAGNOSIS — M545 Low back pain, unspecified: Secondary | ICD-10-CM | POA: Diagnosis not present

## 2012-01-15 DIAGNOSIS — M999 Biomechanical lesion, unspecified: Secondary | ICD-10-CM | POA: Diagnosis not present

## 2012-01-16 ENCOUNTER — Encounter (INDEPENDENT_AMBULATORY_CARE_PROVIDER_SITE_OTHER): Payer: Medicare Other | Admitting: Ophthalmology

## 2012-01-16 DIAGNOSIS — H353290 Exudative age-related macular degeneration, unspecified eye, stage unspecified: Secondary | ICD-10-CM

## 2012-01-16 DIAGNOSIS — H35039 Hypertensive retinopathy, unspecified eye: Secondary | ICD-10-CM | POA: Diagnosis not present

## 2012-01-16 DIAGNOSIS — H3530 Unspecified macular degeneration: Secondary | ICD-10-CM | POA: Diagnosis not present

## 2012-01-16 DIAGNOSIS — I10 Essential (primary) hypertension: Secondary | ICD-10-CM

## 2012-01-16 DIAGNOSIS — H43819 Vitreous degeneration, unspecified eye: Secondary | ICD-10-CM

## 2012-02-05 DIAGNOSIS — F4323 Adjustment disorder with mixed anxiety and depressed mood: Secondary | ICD-10-CM | POA: Diagnosis not present

## 2012-02-10 ENCOUNTER — Encounter (INDEPENDENT_AMBULATORY_CARE_PROVIDER_SITE_OTHER): Payer: Medicare Other | Admitting: Ophthalmology

## 2012-02-10 DIAGNOSIS — H2510 Age-related nuclear cataract, unspecified eye: Secondary | ICD-10-CM

## 2012-02-10 DIAGNOSIS — H43819 Vitreous degeneration, unspecified eye: Secondary | ICD-10-CM | POA: Diagnosis not present

## 2012-02-10 DIAGNOSIS — H35039 Hypertensive retinopathy, unspecified eye: Secondary | ICD-10-CM | POA: Diagnosis not present

## 2012-02-10 DIAGNOSIS — H353290 Exudative age-related macular degeneration, unspecified eye, stage unspecified: Secondary | ICD-10-CM | POA: Diagnosis not present

## 2012-02-10 DIAGNOSIS — I10 Essential (primary) hypertension: Secondary | ICD-10-CM

## 2012-03-09 ENCOUNTER — Encounter (INDEPENDENT_AMBULATORY_CARE_PROVIDER_SITE_OTHER): Payer: Medicare Other | Admitting: Ophthalmology

## 2012-03-09 DIAGNOSIS — H43819 Vitreous degeneration, unspecified eye: Secondary | ICD-10-CM | POA: Diagnosis not present

## 2012-03-09 DIAGNOSIS — I10 Essential (primary) hypertension: Secondary | ICD-10-CM | POA: Diagnosis not present

## 2012-03-09 DIAGNOSIS — H35039 Hypertensive retinopathy, unspecified eye: Secondary | ICD-10-CM | POA: Diagnosis not present

## 2012-03-09 DIAGNOSIS — H2510 Age-related nuclear cataract, unspecified eye: Secondary | ICD-10-CM

## 2012-03-09 DIAGNOSIS — H353290 Exudative age-related macular degeneration, unspecified eye, stage unspecified: Secondary | ICD-10-CM

## 2012-03-09 DIAGNOSIS — M19049 Primary osteoarthritis, unspecified hand: Secondary | ICD-10-CM | POA: Diagnosis not present

## 2012-03-18 DIAGNOSIS — F4323 Adjustment disorder with mixed anxiety and depressed mood: Secondary | ICD-10-CM | POA: Diagnosis not present

## 2012-03-30 ENCOUNTER — Encounter (INDEPENDENT_AMBULATORY_CARE_PROVIDER_SITE_OTHER): Payer: Medicare Other | Admitting: Ophthalmology

## 2012-03-30 DIAGNOSIS — I10 Essential (primary) hypertension: Secondary | ICD-10-CM | POA: Diagnosis not present

## 2012-03-30 DIAGNOSIS — H35039 Hypertensive retinopathy, unspecified eye: Secondary | ICD-10-CM | POA: Diagnosis not present

## 2012-03-30 DIAGNOSIS — H2510 Age-related nuclear cataract, unspecified eye: Secondary | ICD-10-CM

## 2012-03-30 DIAGNOSIS — H43819 Vitreous degeneration, unspecified eye: Secondary | ICD-10-CM | POA: Diagnosis not present

## 2012-03-30 DIAGNOSIS — H353290 Exudative age-related macular degeneration, unspecified eye, stage unspecified: Secondary | ICD-10-CM | POA: Diagnosis not present

## 2012-04-08 DIAGNOSIS — F4323 Adjustment disorder with mixed anxiety and depressed mood: Secondary | ICD-10-CM | POA: Diagnosis not present

## 2012-04-13 ENCOUNTER — Encounter (INDEPENDENT_AMBULATORY_CARE_PROVIDER_SITE_OTHER): Payer: Medicare Other | Admitting: Ophthalmology

## 2012-04-20 ENCOUNTER — Encounter (INDEPENDENT_AMBULATORY_CARE_PROVIDER_SITE_OTHER): Payer: Medicare Other | Admitting: Ophthalmology

## 2012-04-20 DIAGNOSIS — I10 Essential (primary) hypertension: Secondary | ICD-10-CM

## 2012-04-20 DIAGNOSIS — H43819 Vitreous degeneration, unspecified eye: Secondary | ICD-10-CM

## 2012-04-20 DIAGNOSIS — H353290 Exudative age-related macular degeneration, unspecified eye, stage unspecified: Secondary | ICD-10-CM | POA: Diagnosis not present

## 2012-04-20 DIAGNOSIS — H2510 Age-related nuclear cataract, unspecified eye: Secondary | ICD-10-CM

## 2012-04-20 DIAGNOSIS — H35039 Hypertensive retinopathy, unspecified eye: Secondary | ICD-10-CM

## 2012-04-23 DIAGNOSIS — Z79899 Other long term (current) drug therapy: Secondary | ICD-10-CM | POA: Diagnosis not present

## 2012-04-23 DIAGNOSIS — I2510 Atherosclerotic heart disease of native coronary artery without angina pectoris: Secondary | ICD-10-CM | POA: Diagnosis not present

## 2012-04-23 DIAGNOSIS — I10 Essential (primary) hypertension: Secondary | ICD-10-CM | POA: Diagnosis not present

## 2012-04-23 DIAGNOSIS — G4733 Obstructive sleep apnea (adult) (pediatric): Secondary | ICD-10-CM | POA: Diagnosis not present

## 2012-04-23 DIAGNOSIS — E7800 Pure hypercholesterolemia, unspecified: Secondary | ICD-10-CM | POA: Diagnosis not present

## 2012-04-27 DIAGNOSIS — Z79899 Other long term (current) drug therapy: Secondary | ICD-10-CM | POA: Diagnosis not present

## 2012-04-27 DIAGNOSIS — E7800 Pure hypercholesterolemia, unspecified: Secondary | ICD-10-CM | POA: Diagnosis not present

## 2012-04-27 DIAGNOSIS — I2510 Atherosclerotic heart disease of native coronary artery without angina pectoris: Secondary | ICD-10-CM | POA: Diagnosis not present

## 2012-04-29 DIAGNOSIS — F4323 Adjustment disorder with mixed anxiety and depressed mood: Secondary | ICD-10-CM | POA: Diagnosis not present

## 2012-05-25 ENCOUNTER — Encounter (INDEPENDENT_AMBULATORY_CARE_PROVIDER_SITE_OTHER): Payer: Medicare Other | Admitting: Ophthalmology

## 2012-05-25 DIAGNOSIS — H353290 Exudative age-related macular degeneration, unspecified eye, stage unspecified: Secondary | ICD-10-CM

## 2012-05-25 DIAGNOSIS — I10 Essential (primary) hypertension: Secondary | ICD-10-CM

## 2012-05-25 DIAGNOSIS — H43819 Vitreous degeneration, unspecified eye: Secondary | ICD-10-CM

## 2012-05-25 DIAGNOSIS — H2510 Age-related nuclear cataract, unspecified eye: Secondary | ICD-10-CM

## 2012-05-25 DIAGNOSIS — H35039 Hypertensive retinopathy, unspecified eye: Secondary | ICD-10-CM

## 2012-05-27 DIAGNOSIS — F4323 Adjustment disorder with mixed anxiety and depressed mood: Secondary | ICD-10-CM | POA: Diagnosis not present

## 2012-06-01 DIAGNOSIS — M999 Biomechanical lesion, unspecified: Secondary | ICD-10-CM | POA: Diagnosis not present

## 2012-06-01 DIAGNOSIS — M545 Low back pain, unspecified: Secondary | ICD-10-CM | POA: Diagnosis not present

## 2012-06-03 DIAGNOSIS — M545 Low back pain, unspecified: Secondary | ICD-10-CM | POA: Diagnosis not present

## 2012-06-03 DIAGNOSIS — M999 Biomechanical lesion, unspecified: Secondary | ICD-10-CM | POA: Diagnosis not present

## 2012-06-04 DIAGNOSIS — M545 Low back pain, unspecified: Secondary | ICD-10-CM | POA: Diagnosis not present

## 2012-06-04 DIAGNOSIS — M999 Biomechanical lesion, unspecified: Secondary | ICD-10-CM | POA: Diagnosis not present

## 2012-06-08 DIAGNOSIS — M545 Low back pain, unspecified: Secondary | ICD-10-CM | POA: Diagnosis not present

## 2012-06-08 DIAGNOSIS — M999 Biomechanical lesion, unspecified: Secondary | ICD-10-CM | POA: Diagnosis not present

## 2012-06-16 DIAGNOSIS — M545 Low back pain, unspecified: Secondary | ICD-10-CM | POA: Diagnosis not present

## 2012-06-16 DIAGNOSIS — M999 Biomechanical lesion, unspecified: Secondary | ICD-10-CM | POA: Diagnosis not present

## 2012-06-18 DIAGNOSIS — M999 Biomechanical lesion, unspecified: Secondary | ICD-10-CM | POA: Diagnosis not present

## 2012-06-18 DIAGNOSIS — M545 Low back pain, unspecified: Secondary | ICD-10-CM | POA: Diagnosis not present

## 2012-06-22 ENCOUNTER — Encounter (INDEPENDENT_AMBULATORY_CARE_PROVIDER_SITE_OTHER): Payer: Medicare Other | Admitting: Ophthalmology

## 2012-06-22 DIAGNOSIS — H2510 Age-related nuclear cataract, unspecified eye: Secondary | ICD-10-CM

## 2012-06-22 DIAGNOSIS — H43819 Vitreous degeneration, unspecified eye: Secondary | ICD-10-CM

## 2012-06-22 DIAGNOSIS — H353290 Exudative age-related macular degeneration, unspecified eye, stage unspecified: Secondary | ICD-10-CM | POA: Diagnosis not present

## 2012-06-22 DIAGNOSIS — M545 Low back pain, unspecified: Secondary | ICD-10-CM | POA: Diagnosis not present

## 2012-06-22 DIAGNOSIS — I10 Essential (primary) hypertension: Secondary | ICD-10-CM | POA: Diagnosis not present

## 2012-06-22 DIAGNOSIS — H35039 Hypertensive retinopathy, unspecified eye: Secondary | ICD-10-CM | POA: Diagnosis not present

## 2012-06-22 DIAGNOSIS — M999 Biomechanical lesion, unspecified: Secondary | ICD-10-CM | POA: Diagnosis not present

## 2012-06-29 ENCOUNTER — Encounter (INDEPENDENT_AMBULATORY_CARE_PROVIDER_SITE_OTHER): Payer: Medicare HMO | Admitting: Ophthalmology

## 2012-06-29 DIAGNOSIS — H353290 Exudative age-related macular degeneration, unspecified eye, stage unspecified: Secondary | ICD-10-CM

## 2012-08-10 ENCOUNTER — Encounter (INDEPENDENT_AMBULATORY_CARE_PROVIDER_SITE_OTHER): Payer: Medicare HMO | Admitting: Ophthalmology

## 2012-08-10 DIAGNOSIS — H35039 Hypertensive retinopathy, unspecified eye: Secondary | ICD-10-CM

## 2012-08-10 DIAGNOSIS — H43819 Vitreous degeneration, unspecified eye: Secondary | ICD-10-CM

## 2012-08-10 DIAGNOSIS — H353290 Exudative age-related macular degeneration, unspecified eye, stage unspecified: Secondary | ICD-10-CM | POA: Diagnosis not present

## 2012-08-10 DIAGNOSIS — I10 Essential (primary) hypertension: Secondary | ICD-10-CM

## 2012-09-07 DIAGNOSIS — H353290 Exudative age-related macular degeneration, unspecified eye, stage unspecified: Secondary | ICD-10-CM | POA: Diagnosis not present

## 2012-09-07 DIAGNOSIS — H409 Unspecified glaucoma: Secondary | ICD-10-CM | POA: Diagnosis not present

## 2012-09-07 DIAGNOSIS — H401290 Low-tension glaucoma, unspecified eye, stage unspecified: Secondary | ICD-10-CM | POA: Diagnosis not present

## 2012-09-07 DIAGNOSIS — H179 Unspecified corneal scar and opacity: Secondary | ICD-10-CM | POA: Diagnosis not present

## 2012-09-07 DIAGNOSIS — H35039 Hypertensive retinopathy, unspecified eye: Secondary | ICD-10-CM | POA: Diagnosis not present

## 2012-09-30 DIAGNOSIS — F4323 Adjustment disorder with mixed anxiety and depressed mood: Secondary | ICD-10-CM | POA: Diagnosis not present

## 2012-10-05 ENCOUNTER — Encounter (INDEPENDENT_AMBULATORY_CARE_PROVIDER_SITE_OTHER): Payer: Medicare HMO | Admitting: Ophthalmology

## 2012-10-05 DIAGNOSIS — H35039 Hypertensive retinopathy, unspecified eye: Secondary | ICD-10-CM | POA: Diagnosis not present

## 2012-10-05 DIAGNOSIS — I10 Essential (primary) hypertension: Secondary | ICD-10-CM | POA: Diagnosis not present

## 2012-10-05 DIAGNOSIS — H353290 Exudative age-related macular degeneration, unspecified eye, stage unspecified: Secondary | ICD-10-CM

## 2012-10-05 DIAGNOSIS — H2510 Age-related nuclear cataract, unspecified eye: Secondary | ICD-10-CM

## 2012-10-05 DIAGNOSIS — H43819 Vitreous degeneration, unspecified eye: Secondary | ICD-10-CM

## 2012-10-12 ENCOUNTER — Encounter (INDEPENDENT_AMBULATORY_CARE_PROVIDER_SITE_OTHER): Payer: Medicare Other | Admitting: Ophthalmology

## 2012-10-23 DIAGNOSIS — G4733 Obstructive sleep apnea (adult) (pediatric): Secondary | ICD-10-CM | POA: Diagnosis not present

## 2012-10-23 DIAGNOSIS — I2510 Atherosclerotic heart disease of native coronary artery without angina pectoris: Secondary | ICD-10-CM | POA: Diagnosis not present

## 2012-10-23 DIAGNOSIS — Z79899 Other long term (current) drug therapy: Secondary | ICD-10-CM | POA: Diagnosis not present

## 2012-10-23 DIAGNOSIS — I10 Essential (primary) hypertension: Secondary | ICD-10-CM | POA: Diagnosis not present

## 2012-10-23 DIAGNOSIS — E7800 Pure hypercholesterolemia, unspecified: Secondary | ICD-10-CM | POA: Diagnosis not present

## 2012-10-28 DIAGNOSIS — F4323 Adjustment disorder with mixed anxiety and depressed mood: Secondary | ICD-10-CM | POA: Diagnosis not present

## 2012-10-29 DIAGNOSIS — I10 Essential (primary) hypertension: Secondary | ICD-10-CM | POA: Diagnosis not present

## 2012-10-29 DIAGNOSIS — E7800 Pure hypercholesterolemia, unspecified: Secondary | ICD-10-CM | POA: Diagnosis not present

## 2012-10-29 DIAGNOSIS — I2510 Atherosclerotic heart disease of native coronary artery without angina pectoris: Secondary | ICD-10-CM | POA: Diagnosis not present

## 2012-10-29 DIAGNOSIS — Z79899 Other long term (current) drug therapy: Secondary | ICD-10-CM | POA: Diagnosis not present

## 2012-10-29 DIAGNOSIS — G4733 Obstructive sleep apnea (adult) (pediatric): Secondary | ICD-10-CM | POA: Diagnosis not present

## 2012-11-18 DIAGNOSIS — F4323 Adjustment disorder with mixed anxiety and depressed mood: Secondary | ICD-10-CM | POA: Diagnosis not present

## 2012-11-30 ENCOUNTER — Encounter (INDEPENDENT_AMBULATORY_CARE_PROVIDER_SITE_OTHER): Payer: Medicare HMO | Admitting: Ophthalmology

## 2012-11-30 DIAGNOSIS — H35039 Hypertensive retinopathy, unspecified eye: Secondary | ICD-10-CM

## 2012-11-30 DIAGNOSIS — H43819 Vitreous degeneration, unspecified eye: Secondary | ICD-10-CM

## 2012-11-30 DIAGNOSIS — I10 Essential (primary) hypertension: Secondary | ICD-10-CM

## 2012-11-30 DIAGNOSIS — H353290 Exudative age-related macular degeneration, unspecified eye, stage unspecified: Secondary | ICD-10-CM | POA: Diagnosis not present

## 2012-12-16 DIAGNOSIS — F4323 Adjustment disorder with mixed anxiety and depressed mood: Secondary | ICD-10-CM | POA: Diagnosis not present

## 2013-01-13 DIAGNOSIS — F4323 Adjustment disorder with mixed anxiety and depressed mood: Secondary | ICD-10-CM | POA: Diagnosis not present

## 2013-01-22 ENCOUNTER — Encounter (INDEPENDENT_AMBULATORY_CARE_PROVIDER_SITE_OTHER): Payer: Medicare Other | Admitting: Ophthalmology

## 2013-01-22 DIAGNOSIS — H2510 Age-related nuclear cataract, unspecified eye: Secondary | ICD-10-CM

## 2013-01-22 DIAGNOSIS — H353290 Exudative age-related macular degeneration, unspecified eye, stage unspecified: Secondary | ICD-10-CM | POA: Diagnosis not present

## 2013-01-22 DIAGNOSIS — I10 Essential (primary) hypertension: Secondary | ICD-10-CM | POA: Diagnosis not present

## 2013-01-22 DIAGNOSIS — H35039 Hypertensive retinopathy, unspecified eye: Secondary | ICD-10-CM | POA: Diagnosis not present

## 2013-01-22 DIAGNOSIS — H43819 Vitreous degeneration, unspecified eye: Secondary | ICD-10-CM | POA: Diagnosis not present

## 2013-01-25 ENCOUNTER — Encounter (INDEPENDENT_AMBULATORY_CARE_PROVIDER_SITE_OTHER): Payer: Medicare Other | Admitting: Ophthalmology

## 2013-02-10 DIAGNOSIS — F4323 Adjustment disorder with mixed anxiety and depressed mood: Secondary | ICD-10-CM | POA: Diagnosis not present

## 2013-03-03 DIAGNOSIS — F4323 Adjustment disorder with mixed anxiety and depressed mood: Secondary | ICD-10-CM | POA: Diagnosis not present

## 2013-03-10 DIAGNOSIS — H401290 Low-tension glaucoma, unspecified eye, stage unspecified: Secondary | ICD-10-CM | POA: Diagnosis not present

## 2013-03-10 DIAGNOSIS — H409 Unspecified glaucoma: Secondary | ICD-10-CM | POA: Diagnosis not present

## 2013-03-31 DIAGNOSIS — F4323 Adjustment disorder with mixed anxiety and depressed mood: Secondary | ICD-10-CM | POA: Diagnosis not present

## 2013-04-07 DIAGNOSIS — M722 Plantar fascial fibromatosis: Secondary | ICD-10-CM | POA: Diagnosis not present

## 2013-04-07 DIAGNOSIS — M545 Low back pain, unspecified: Secondary | ICD-10-CM | POA: Diagnosis not present

## 2013-04-07 DIAGNOSIS — M999 Biomechanical lesion, unspecified: Secondary | ICD-10-CM | POA: Diagnosis not present

## 2013-04-09 ENCOUNTER — Encounter (INDEPENDENT_AMBULATORY_CARE_PROVIDER_SITE_OTHER): Payer: Medicare Other | Admitting: Ophthalmology

## 2013-04-09 DIAGNOSIS — H35039 Hypertensive retinopathy, unspecified eye: Secondary | ICD-10-CM | POA: Diagnosis not present

## 2013-04-09 DIAGNOSIS — H353290 Exudative age-related macular degeneration, unspecified eye, stage unspecified: Secondary | ICD-10-CM

## 2013-04-09 DIAGNOSIS — I10 Essential (primary) hypertension: Secondary | ICD-10-CM | POA: Diagnosis not present

## 2013-04-09 DIAGNOSIS — H43819 Vitreous degeneration, unspecified eye: Secondary | ICD-10-CM | POA: Diagnosis not present

## 2013-04-09 DIAGNOSIS — H2510 Age-related nuclear cataract, unspecified eye: Secondary | ICD-10-CM

## 2013-04-12 DIAGNOSIS — M545 Low back pain, unspecified: Secondary | ICD-10-CM | POA: Diagnosis not present

## 2013-04-12 DIAGNOSIS — M999 Biomechanical lesion, unspecified: Secondary | ICD-10-CM | POA: Diagnosis not present

## 2013-04-12 DIAGNOSIS — M722 Plantar fascial fibromatosis: Secondary | ICD-10-CM | POA: Diagnosis not present

## 2013-04-14 DIAGNOSIS — M545 Low back pain, unspecified: Secondary | ICD-10-CM | POA: Diagnosis not present

## 2013-04-14 DIAGNOSIS — M999 Biomechanical lesion, unspecified: Secondary | ICD-10-CM | POA: Diagnosis not present

## 2013-04-14 DIAGNOSIS — M722 Plantar fascial fibromatosis: Secondary | ICD-10-CM | POA: Diagnosis not present

## 2013-04-20 ENCOUNTER — Telehealth: Payer: Self-pay | Admitting: Cardiology

## 2013-04-20 DIAGNOSIS — M545 Low back pain, unspecified: Secondary | ICD-10-CM | POA: Diagnosis not present

## 2013-04-20 DIAGNOSIS — M999 Biomechanical lesion, unspecified: Secondary | ICD-10-CM | POA: Diagnosis not present

## 2013-04-20 DIAGNOSIS — M722 Plantar fascial fibromatosis: Secondary | ICD-10-CM | POA: Diagnosis not present

## 2013-04-20 NOTE — Telephone Encounter (Signed)
New Problem:  Pt's wife states her husband is having teeth pulled. She states they don't have an appt yet b/c they want to know when the pt should stop taking Plavix and fish oil.. Also, Pt's wife states she wants to know when her husband can start taking them again. Please advise

## 2013-04-20 NOTE — Telephone Encounter (Signed)
To Danielle.  

## 2013-04-20 NOTE — Telephone Encounter (Signed)
Patient needs to check with his dentist to find out if he needs to hold Plavix.  Sometimes they will pull teeth on blood thinners

## 2013-04-20 NOTE — Telephone Encounter (Signed)
Called patient's wife back and advised that Dr.Turner is not in the office today. Will get a call back tomorrow when she comes back to the office.

## 2013-04-21 NOTE — Telephone Encounter (Signed)
Called and LVM for pt to make aware to call Dentist to see if they even need him off the plavix with extracting a tooth.

## 2013-04-22 DIAGNOSIS — M545 Low back pain, unspecified: Secondary | ICD-10-CM | POA: Diagnosis not present

## 2013-04-22 DIAGNOSIS — M722 Plantar fascial fibromatosis: Secondary | ICD-10-CM | POA: Diagnosis not present

## 2013-04-22 DIAGNOSIS — M999 Biomechanical lesion, unspecified: Secondary | ICD-10-CM | POA: Diagnosis not present

## 2013-04-23 NOTE — Telephone Encounter (Signed)
Follow Up:  Pt's wife is calling again regarding dental clearance. Please advise

## 2013-04-23 NOTE — Telephone Encounter (Signed)
Pt has always been taking off the plavix in the past and he is having 5 teeth extracted. To [employer] to advise since Dr Mayford Knife is not in office today.

## 2013-04-26 DIAGNOSIS — M999 Biomechanical lesion, unspecified: Secondary | ICD-10-CM | POA: Diagnosis not present

## 2013-04-26 DIAGNOSIS — M545 Low back pain, unspecified: Secondary | ICD-10-CM | POA: Diagnosis not present

## 2013-04-26 DIAGNOSIS — M722 Plantar fascial fibromatosis: Secondary | ICD-10-CM | POA: Diagnosis not present

## 2013-04-26 NOTE — Telephone Encounter (Signed)
It appears he had PCI in 2006, and given 5 teeth to be extracted, I would recommend stopping plavix 5 days prior.  To Dr. Mayford Knife to assess and then back to [facility] to notify wife.

## 2013-04-26 NOTE — Telephone Encounter (Signed)
AGree

## 2013-04-26 NOTE — Telephone Encounter (Signed)
Pt is aware.  

## 2013-04-27 DIAGNOSIS — H6120 Impacted cerumen, unspecified ear: Secondary | ICD-10-CM | POA: Diagnosis not present

## 2013-04-28 ENCOUNTER — Encounter: Payer: Self-pay | Admitting: Cardiology

## 2013-04-29 ENCOUNTER — Ambulatory Visit (INDEPENDENT_AMBULATORY_CARE_PROVIDER_SITE_OTHER): Payer: Medicare Other | Admitting: Cardiology

## 2013-04-29 ENCOUNTER — Encounter: Payer: Self-pay | Admitting: Cardiology

## 2013-04-29 VITALS — BP 144/72 | HR 60 | Ht 72.0 in | Wt 198.0 lb

## 2013-04-29 DIAGNOSIS — I4510 Unspecified right bundle-branch block: Secondary | ICD-10-CM | POA: Diagnosis not present

## 2013-04-29 DIAGNOSIS — I2510 Atherosclerotic heart disease of native coronary artery without angina pectoris: Secondary | ICD-10-CM

## 2013-04-29 DIAGNOSIS — I10 Essential (primary) hypertension: Secondary | ICD-10-CM | POA: Diagnosis not present

## 2013-04-29 DIAGNOSIS — E785 Hyperlipidemia, unspecified: Secondary | ICD-10-CM

## 2013-04-29 NOTE — Progress Notes (Signed)
[address], [address] [place], [place]  [address] Phone: [phone] Fax:  [phone]  Date:  04/29/2013   ID:  Dominic [name], Dominic [name] [birth year], MRN [id number]  PCP:   Duane Lope, MD  Cardiologist:  Armanda Magic, MD     History of Present Illness: Dominic [name] is a 79 y.o. male with a history of ASCAD/HTN/dyslipidemia and OSA on CPAP.  He is doing well.  He denies any chest pain, SOB, DOE, LE edema, dizziness, palpitations or syncope.  He tolerates his CPAP therapy well.  He tolerates his mask and pressure well.  He feels rested in the am and has no daytime sleepiness.  He just moved so he has not used his CPAP in a few weeks due to packing and it is packed but he plans on starting back on it.     Wt Readings from Last 3 Encounters:  04/29/13 198 lb (89.812 kg)  12/28/11 210 lb (95.255 kg)     Past Medical History  Diagnosis Date  . Dyslipidemia   . Lung nodule     , right upper lobe  . Obstructive sleep apnea   . Prostate cancer   . Amputated finger     L index, dew to dog bite  . Concussion   . Coronary artery disease 09/2004 and 04/2005    s/p PCI RCA with redo stent for restenosis x 2  . Hypertension   . Hyperlipidemia   . RBBB (right bundle branch block)     Current Outpatient Prescriptions  Medication Sig Dispense Refill  . BETA CAROTENE PO Take 1 tablet by mouth daily.      . brimonidine (ALPHAGAN P) 0.1 % SOLN Place 1 drop into both eyes 2 (two) times daily.      . clopidogrel (PLAVIX) 75 MG tablet Take 75 mg by mouth daily.      . Coenzyme Q10 (CO Q-10 PO) Take 1 tablet by mouth daily.      . colesevelam (WELCHOL) 625 MG tablet Take 1,250 mg by mouth 2 (two) times daily with a meal.      . LUTEIN-ZEAXANTHIN PO Take 1 tablet by mouth daily.      . metoprolol succinate (TOPROL-XL) 25 MG 24 hr tablet Take 75 mg by mouth daily.      . Multiple Vitamin (MULTIVITAMIN WITH MINERALS) TABS Take 1 tablet by mouth daily.      . Multiple Vitamins-Minerals (ZINC PO)  Take 1 tablet by mouth 2 (two) times daily. Maxi 2      . nitroGLYCERIN (NITROSTAT) 0.4 MG SL tablet Place 0.4 mg under the tongue every 5 (five) minutes as needed. For chest pain      . Omega-3 Fatty Acids (FISH OIL PO) Take 2 capsules by mouth 2 (two) times daily.      Marland Kitchen OVER THE COUNTER MEDICATION Take 1 tablet by mouth daily. Piracetam supplement for memory       No current facility-administered medications for this visit.    Allergies:    Allergies  Allergen Reactions  . Statins Other (See Comments)    Myalgia  . Zetia [Ezetimibe] Other (See Comments)    myalgia    Social History:  The patient  reports that he has never smoked. He has never used smokeless tobacco. He reports that he does not drink alcohol or use illicit drugs.   Family History:  The patient's family history is not on file.   ROS:  Please see  the history of present illness.      All other systems reviewed and negative.   PHYSICAL EXAM: VS:  BP 144/72  Pulse 60  Ht 6' (1.829 m)  Wt 198 lb (89.812 kg)  BMI 26.85 kg/m2 Well nourished, well developed, in no acute distress HEENT: normal Neck: no JVD Cardiac:  normal S1, S2; RRR; no murmur Lungs:  clear to auscultation bilaterally, no wheezing, rhonchi or rales Abd: soft, nontender, no hepatomegaly Ext: no edema Skin: warm and dry Neuro:  CNs 2-12 intact, no focal abnormalities noted      ASSESSMENT AND PLAN:  1. ASCAD stable with no angina  - continue Plavix 2. HTN - controlled  - continue metoprolol 3. Dyslipidemia - statin intolerant  - continue fish oil/Welchol 4. OSA on CPAP and tolerating well  - I will get a download in 4 weeks  Followup with me in 6 months  Signed, Armanda Magic, MD 04/29/2013 4:32 PM

## 2013-04-29 NOTE — Patient Instructions (Signed)
Your physician recommends that you return for lab work on 05/05/13 fasting for lipid and hepatic panel.  Your physician wants you to follow-up in: 6 months with Dr. Mayford Knife. You will receive a reminder letter in the mail two months in advance. If you don't receive a letter, please call our office to schedule the follow-up appointment.  We are contacting [facility] to obtain a download in 4 weeks.

## 2013-05-03 ENCOUNTER — Ambulatory Visit (INDEPENDENT_AMBULATORY_CARE_PROVIDER_SITE_OTHER): Payer: Medicare Other | Admitting: Podiatry

## 2013-05-03 ENCOUNTER — Encounter: Payer: Self-pay | Admitting: Podiatry

## 2013-05-03 VITALS — Ht 72.0 in | Wt 198.0 lb

## 2013-05-03 DIAGNOSIS — M216X9 Other acquired deformities of unspecified foot: Secondary | ICD-10-CM

## 2013-05-03 DIAGNOSIS — L84 Corns and callosities: Secondary | ICD-10-CM | POA: Diagnosis not present

## 2013-05-03 NOTE — Progress Notes (Signed)
N-SORE, DRY SKIN L-RT FOOT D-4 MONTHS O-SLOWLY C-WORSE A-PRESSURE T-N/A

## 2013-05-03 NOTE — Progress Notes (Signed)
  Subjective:    Patient ID: Dominic [name], male    DOB: 12/01/1933, 79 y.o.   MRN: [id number]  HPI    Review of Systems  Hematological: Bruises/bleeds easily.  All other systems reviewed and are negative.       Objective:   Physical Exam        Assessment & Plan:

## 2013-05-05 ENCOUNTER — Other Ambulatory Visit (INDEPENDENT_AMBULATORY_CARE_PROVIDER_SITE_OTHER): Payer: Medicare Other

## 2013-05-05 DIAGNOSIS — M545 Low back pain, unspecified: Secondary | ICD-10-CM | POA: Diagnosis not present

## 2013-05-05 DIAGNOSIS — I2510 Atherosclerotic heart disease of native coronary artery without angina pectoris: Secondary | ICD-10-CM | POA: Diagnosis not present

## 2013-05-05 DIAGNOSIS — M722 Plantar fascial fibromatosis: Secondary | ICD-10-CM | POA: Diagnosis not present

## 2013-05-05 DIAGNOSIS — M999 Biomechanical lesion, unspecified: Secondary | ICD-10-CM | POA: Diagnosis not present

## 2013-05-05 LAB — HEPATIC FUNCTION PANEL
AST: 20 U/L (ref 0–37)
Alkaline Phosphatase: 80 U/L (ref 39–117)
Bilirubin, Direct: 0.1 mg/dL (ref 0.0–0.3)
Total Bilirubin: 1 mg/dL (ref 0.3–1.2)

## 2013-05-05 LAB — LIPID PANEL: Total CHOL/HDL Ratio: 6

## 2013-05-05 NOTE — Progress Notes (Signed)
Subjective:     Patient ID: Dominic [name], male   DOB: 1934/02/17, 79 y.o.   MRN: [id number]  HPI patient states my right foot is becoming increasingly tender over the last 4 months. States he his concern because it's becoming harder for him to walk as time goes on and that he cannot feel his feet and he is worried that he may develop infection   Review of Systems  All other systems reviewed and are negative.       Objective:   Physical Exam  Nursing note and vitals reviewed. Constitutional: He is oriented to person, place, and time.  Cardiovascular: Intact distal pulses.   Musculoskeletal: Normal range of motion.  Neurological: He is oriented to person, place, and time.   I noted there to be significant reduction of neurological sensation but sharp dull and vibratory with diminishment of pinpoint testing noted. Circulatory status adequate and muscle strength was adequate. Plantar aspect right foot I noted a large keratotic lesion under the fourth metatarsal that is thick with dried blood associated with it. I did not note any proximal edema erythema or lymph node distention     Assessment:     Significant plantar callus right which is probably related to neuropathy    Plan:     Educated patient and his wife on this condition and today the deep debridement of lesion with no indications of underlying subcutaneous exposure or infection. Advised on daily inspections and if any issue with this were to occur they are 2, and and see me immediately

## 2013-05-11 ENCOUNTER — Telehealth: Payer: Self-pay | Admitting: General Surgery

## 2013-05-11 DIAGNOSIS — E785 Hyperlipidemia, unspecified: Secondary | ICD-10-CM

## 2013-05-11 NOTE — Telephone Encounter (Signed)
Pt is NOT taking Welchol. They are taking tocotrienols 125 MG BID. Pt does not and will not do any statins.

## 2013-05-11 NOTE — Telephone Encounter (Signed)
Given not taking Welchol, won't try Crestor qweek, intolerant to Zetia and daily statins, not a lot we can do from a medication standpoint.  I would suggest patient use Metamucil powder twice daily as a natural way to reduce cholesterol.  No memory or muscle side effects would occur with this. Plan: 1.  Add metamucil 1 tablespoon of powder mixed with 8 ounces of water twice daily. 2.  Continue other meds. 3.  Recheck lipid panel and hepatic panel in 1 year. Please notify patient, update meds, and set up labs. Thanks.

## 2013-05-11 NOTE — Telephone Encounter (Signed)
Message copied by Nita Sells on Tue May 11, 2013  1:02 PM ------      Message from: SMART, Gaspar Skeeters      Created: Thu May 06, 2013  5:26 PM       Given CAD, would like to see LDL < 100 at least.      Meds:  Welchol daily, OTC - Tocotrienols 125mg  2 tablets daily, Natural Omega 3 Fish Oil-heart formula 2,000mg .      He has been seeing a nutritionist as he refused to take statins as lipitor caused weakness and possible cognitive issues in the past.  His LDL has improved from 176 to 142 in past on this regimen, but now LDL back up to 166 mg/dL.      Intolerant:  Zetia and statins.      Patient doesn't want to take statins again.      Plan:      1.  Please check to see if patient is still taking Welchol and tocotrienols daily.      2.  See if patient is willing to take Crestor 10 mg  just ONCE WEEKLY.  Side effects are extremely rare with this dose, however a significant cholesterol reduction is seen with this.      Please let me know what patient has to say.  Thank you! ------

## 2013-05-12 DIAGNOSIS — M722 Plantar fascial fibromatosis: Secondary | ICD-10-CM | POA: Diagnosis not present

## 2013-05-12 DIAGNOSIS — M999 Biomechanical lesion, unspecified: Secondary | ICD-10-CM | POA: Diagnosis not present

## 2013-05-12 DIAGNOSIS — M545 Low back pain, unspecified: Secondary | ICD-10-CM | POA: Diagnosis not present

## 2013-05-12 MED ORDER — PSYLLIUM 30.9 % PO POWD: 1.0000 | Freq: Two times a day (BID) | ORAL | Status: DC

## 2013-05-12 NOTE — Telephone Encounter (Signed)
Spoke with Wife and made aware. Pts wife will buy pt metamucil. Pt set up for labs on year out.

## 2013-05-12 NOTE — Addendum Note (Signed)
Addended by: Nita Sells on: 05/12/2013 02:12 PM   Modules accepted: Orders

## 2013-05-14 ENCOUNTER — Encounter: Payer: Self-pay | Admitting: General Surgery

## 2013-05-18 DIAGNOSIS — M545 Low back pain, unspecified: Secondary | ICD-10-CM | POA: Diagnosis not present

## 2013-05-18 DIAGNOSIS — M722 Plantar fascial fibromatosis: Secondary | ICD-10-CM | POA: Diagnosis not present

## 2013-05-18 DIAGNOSIS — M999 Biomechanical lesion, unspecified: Secondary | ICD-10-CM | POA: Diagnosis not present

## 2013-05-25 DIAGNOSIS — M545 Low back pain, unspecified: Secondary | ICD-10-CM | POA: Diagnosis not present

## 2013-05-25 DIAGNOSIS — M722 Plantar fascial fibromatosis: Secondary | ICD-10-CM | POA: Diagnosis not present

## 2013-05-25 DIAGNOSIS — M999 Biomechanical lesion, unspecified: Secondary | ICD-10-CM | POA: Diagnosis not present

## 2013-06-02 ENCOUNTER — Ambulatory Visit: Payer: Medicare Other | Admitting: Podiatry

## 2013-06-02 ENCOUNTER — Ambulatory Visit (INDEPENDENT_AMBULATORY_CARE_PROVIDER_SITE_OTHER): Payer: Medicare Other | Admitting: Podiatry

## 2013-06-02 ENCOUNTER — Encounter: Payer: Self-pay | Admitting: Podiatry

## 2013-06-02 VITALS — BP 150/74 | HR 56 | Temp 98.0°F | Resp 20 | Ht 72.0 in | Wt 197.0 lb

## 2013-06-02 DIAGNOSIS — F4323 Adjustment disorder with mixed anxiety and depressed mood: Secondary | ICD-10-CM | POA: Diagnosis not present

## 2013-06-02 DIAGNOSIS — L84 Corns and callosities: Secondary | ICD-10-CM | POA: Diagnosis not present

## 2013-06-02 NOTE — Progress Notes (Signed)
Patient ID: Dominic [name], male   DOB: Apr 09, 1934, 79 y.o.   MRN: [id number] Subjective: Patient complaining of painful callus on a plantar aspect of the right foot  Objective: Hemorrhagic hyperkeratoses plantar fourth right MPJ noted  Assessment: hyperkeratoses plantar right  Plan: The hyperkeratotic tissue was debrided back without any bleeding. Reappoint at patient's request

## 2013-06-03 DIAGNOSIS — H409 Unspecified glaucoma: Secondary | ICD-10-CM | POA: Diagnosis not present

## 2013-06-03 DIAGNOSIS — H401290 Low-tension glaucoma, unspecified eye, stage unspecified: Secondary | ICD-10-CM | POA: Diagnosis not present

## 2013-06-17 DIAGNOSIS — S329XXA Fracture of unspecified parts of lumbosacral spine and pelvis, initial encounter for closed fracture: Secondary | ICD-10-CM

## 2013-06-17 DIAGNOSIS — S32409A Unspecified fracture of unspecified acetabulum, initial encounter for closed fracture: Secondary | ICD-10-CM

## 2013-06-17 HISTORY — DX: Unspecified fracture of unspecified acetabulum, initial encounter for closed fracture: S32.409A

## 2013-06-17 HISTORY — DX: Fracture of unspecified parts of lumbosacral spine and pelvis, initial encounter for closed fracture: S32.9XXA

## 2013-07-02 ENCOUNTER — Encounter (INDEPENDENT_AMBULATORY_CARE_PROVIDER_SITE_OTHER): Payer: Medicare Other | Admitting: Ophthalmology

## 2013-07-02 DIAGNOSIS — H3530 Unspecified macular degeneration: Secondary | ICD-10-CM | POA: Diagnosis not present

## 2013-07-02 DIAGNOSIS — H43819 Vitreous degeneration, unspecified eye: Secondary | ICD-10-CM

## 2013-07-02 DIAGNOSIS — I10 Essential (primary) hypertension: Secondary | ICD-10-CM | POA: Diagnosis not present

## 2013-07-02 DIAGNOSIS — H35039 Hypertensive retinopathy, unspecified eye: Secondary | ICD-10-CM

## 2013-07-07 DIAGNOSIS — F4323 Adjustment disorder with mixed anxiety and depressed mood: Secondary | ICD-10-CM | POA: Diagnosis not present

## 2013-08-27 ENCOUNTER — Encounter (INDEPENDENT_AMBULATORY_CARE_PROVIDER_SITE_OTHER): Payer: Medicare Other | Admitting: Ophthalmology

## 2013-08-27 DIAGNOSIS — I10 Essential (primary) hypertension: Secondary | ICD-10-CM

## 2013-08-27 DIAGNOSIS — H35039 Hypertensive retinopathy, unspecified eye: Secondary | ICD-10-CM | POA: Diagnosis not present

## 2013-08-27 DIAGNOSIS — H43819 Vitreous degeneration, unspecified eye: Secondary | ICD-10-CM

## 2013-08-27 DIAGNOSIS — H3530 Unspecified macular degeneration: Secondary | ICD-10-CM

## 2013-08-27 DIAGNOSIS — H2510 Age-related nuclear cataract, unspecified eye: Secondary | ICD-10-CM

## 2013-08-30 DIAGNOSIS — M545 Low back pain, unspecified: Secondary | ICD-10-CM | POA: Diagnosis not present

## 2013-08-30 DIAGNOSIS — M999 Biomechanical lesion, unspecified: Secondary | ICD-10-CM | POA: Diagnosis not present

## 2013-09-01 DIAGNOSIS — M999 Biomechanical lesion, unspecified: Secondary | ICD-10-CM | POA: Diagnosis not present

## 2013-09-01 DIAGNOSIS — M545 Low back pain, unspecified: Secondary | ICD-10-CM | POA: Diagnosis not present

## 2013-09-06 DIAGNOSIS — M545 Low back pain, unspecified: Secondary | ICD-10-CM | POA: Diagnosis not present

## 2013-09-06 DIAGNOSIS — M999 Biomechanical lesion, unspecified: Secondary | ICD-10-CM | POA: Diagnosis not present

## 2013-09-08 DIAGNOSIS — F4323 Adjustment disorder with mixed anxiety and depressed mood: Secondary | ICD-10-CM | POA: Diagnosis not present

## 2013-09-09 DIAGNOSIS — M545 Low back pain, unspecified: Secondary | ICD-10-CM | POA: Diagnosis not present

## 2013-09-09 DIAGNOSIS — M999 Biomechanical lesion, unspecified: Secondary | ICD-10-CM | POA: Diagnosis not present

## 2013-10-05 DIAGNOSIS — H353290 Exudative age-related macular degeneration, unspecified eye, stage unspecified: Secondary | ICD-10-CM | POA: Diagnosis not present

## 2013-10-05 DIAGNOSIS — H524 Presbyopia: Secondary | ICD-10-CM | POA: Diagnosis not present

## 2013-10-05 DIAGNOSIS — H409 Unspecified glaucoma: Secondary | ICD-10-CM | POA: Diagnosis not present

## 2013-10-05 DIAGNOSIS — H401290 Low-tension glaucoma, unspecified eye, stage unspecified: Secondary | ICD-10-CM | POA: Diagnosis not present

## 2013-10-08 DIAGNOSIS — F4323 Adjustment disorder with mixed anxiety and depressed mood: Secondary | ICD-10-CM | POA: Diagnosis not present

## 2013-10-12 DIAGNOSIS — H401290 Low-tension glaucoma, unspecified eye, stage unspecified: Secondary | ICD-10-CM | POA: Diagnosis not present

## 2013-10-12 DIAGNOSIS — H409 Unspecified glaucoma: Secondary | ICD-10-CM | POA: Diagnosis not present

## 2013-10-29 ENCOUNTER — Encounter (INDEPENDENT_AMBULATORY_CARE_PROVIDER_SITE_OTHER): Payer: Medicare Other | Admitting: Ophthalmology

## 2013-10-29 DIAGNOSIS — H43819 Vitreous degeneration, unspecified eye: Secondary | ICD-10-CM

## 2013-10-29 DIAGNOSIS — H2510 Age-related nuclear cataract, unspecified eye: Secondary | ICD-10-CM

## 2013-10-29 DIAGNOSIS — H35039 Hypertensive retinopathy, unspecified eye: Secondary | ICD-10-CM | POA: Diagnosis not present

## 2013-10-29 DIAGNOSIS — I10 Essential (primary) hypertension: Secondary | ICD-10-CM | POA: Diagnosis not present

## 2013-10-29 DIAGNOSIS — H3530 Unspecified macular degeneration: Secondary | ICD-10-CM

## 2013-11-20 ENCOUNTER — Encounter (HOSPITAL_BASED_OUTPATIENT_CLINIC_OR_DEPARTMENT_OTHER): Payer: Self-pay | Admitting: Emergency Medicine

## 2013-11-20 ENCOUNTER — Observation Stay (HOSPITAL_BASED_OUTPATIENT_CLINIC_OR_DEPARTMENT_OTHER)
Admission: EM | Admit: 2013-11-20 | Discharge: 2013-11-21 | Disposition: A | Discharge status: Home / Self Care | Payer: Medicare Other | Attending: Internal Medicine | Admitting: Internal Medicine

## 2013-11-20 ENCOUNTER — Emergency Department (HOSPITAL_BASED_OUTPATIENT_CLINIC_OR_DEPARTMENT_OTHER): Payer: Medicare Other

## 2013-11-20 DIAGNOSIS — I4891 Unspecified atrial fibrillation: Secondary | ICD-10-CM | POA: Diagnosis not present

## 2013-11-20 DIAGNOSIS — I480 Paroxysmal atrial fibrillation: Secondary | ICD-10-CM | POA: Diagnosis present

## 2013-11-20 DIAGNOSIS — R072 Precordial pain: Secondary | ICD-10-CM | POA: Diagnosis not present

## 2013-11-20 DIAGNOSIS — R079 Chest pain, unspecified: Secondary | ICD-10-CM | POA: Diagnosis not present

## 2013-11-20 DIAGNOSIS — I2510 Atherosclerotic heart disease of native coronary artery without angina pectoris: Secondary | ICD-10-CM

## 2013-11-20 DIAGNOSIS — I10 Essential (primary) hypertension: Secondary | ICD-10-CM

## 2013-11-20 DIAGNOSIS — E785 Hyperlipidemia, unspecified: Secondary | ICD-10-CM | POA: Diagnosis not present

## 2013-11-20 DIAGNOSIS — G4721 Circadian rhythm sleep disorder, delayed sleep phase type: Secondary | ICD-10-CM | POA: Diagnosis not present

## 2013-11-20 DIAGNOSIS — I4510 Unspecified right bundle-branch block: Secondary | ICD-10-CM | POA: Diagnosis present

## 2013-11-20 DIAGNOSIS — R918 Other nonspecific abnormal finding of lung field: Secondary | ICD-10-CM | POA: Diagnosis not present

## 2013-11-20 DIAGNOSIS — Z9861 Coronary angioplasty status: Secondary | ICD-10-CM

## 2013-11-20 HISTORY — DX: Unspecified atrial fibrillation: I48.91

## 2013-11-20 LAB — CBC
HEMATOCRIT: 46.4 % (ref 39.0–52.0)
HEMOGLOBIN: 16 g/dL (ref 13.0–17.0)
MCH: 29.1 pg (ref 26.0–34.0)
MCHC: 34.5 g/dL (ref 30.0–36.0)
MCV: 84.4 fL (ref 78.0–100.0)
Platelets: 166 10*3/uL (ref 150–400)
RBC: 5.5 MIL/uL (ref 4.22–5.81)
RDW: 14.1 % (ref 11.5–15.5)
WBC: 9.1 10*3/uL (ref 4.0–10.5)

## 2013-11-20 LAB — CBC WITH DIFFERENTIAL/PLATELET
BASOS ABS: 0 10*3/uL (ref 0.0–0.1)
Basophils Relative: 0 % (ref 0–1)
EOS PCT: 0 % (ref 0–5)
Eosinophils Absolute: 0 10*3/uL (ref 0.0–0.7)
HCT: 51.8 % (ref 39.0–52.0)
Hemoglobin: 18.5 g/dL — ABNORMAL HIGH (ref 13.0–17.0)
LYMPHS ABS: 1.6 10*3/uL (ref 0.7–4.0)
LYMPHS PCT: 15 % (ref 12–46)
MCH: 29.6 pg (ref 26.0–34.0)
MCHC: 35.7 g/dL (ref 30.0–36.0)
MCV: 83 fL (ref 78.0–100.0)
Monocytes Absolute: 1 10*3/uL (ref 0.1–1.0)
Monocytes Relative: 9 % (ref 3–12)
NEUTROS PCT: 76 % (ref 43–77)
Neutro Abs: 8.3 10*3/uL — ABNORMAL HIGH (ref 1.7–7.7)
PLATELETS: 197 10*3/uL (ref 150–400)
RBC: 6.24 MIL/uL — AB (ref 4.22–5.81)
RDW: 15.6 % — AB (ref 11.5–15.5)
WBC: 10.9 10*3/uL — AB (ref 4.0–10.5)

## 2013-11-20 LAB — TROPONIN I: Troponin I: <0.3 ng/mL (ref <0.30)

## 2013-11-20 LAB — BASIC METABOLIC PANEL
BUN: 22 mg/dL (ref 6–23)
CALCIUM: 10.6 mg/dL — AB (ref 8.4–10.5)
CO2: 20 meq/L (ref 19–32)
Chloride: 108 mEq/L (ref 96–112)
Creatinine, Ser: 1 mg/dL (ref 0.50–1.35)
GFR calc Af Amer: 82 mL/min — ABNORMAL LOW (ref >90)
GFR, EST NON AFRICAN AMERICAN: 70 mL/min — AB (ref >90)
GLUCOSE: 147 mg/dL — AB (ref 70–99)
POTASSIUM: 4.1 meq/L (ref 3.7–5.3)
SODIUM: 144 meq/L (ref 137–147)

## 2013-11-20 LAB — CREATININE, SERUM
Creatinine, Ser: 0.95 mg/dL (ref 0.50–1.35)
GFR calc Af Amer: >90 mL/min (ref >90)
GFR, EST NON AFRICAN AMERICAN: 78 mL/min — AB (ref >90)

## 2013-11-20 LAB — T4, FREE: FREE T4: 0.93 ng/dL (ref 0.80–1.80)

## 2013-11-20 LAB — TSH: TSH: 1.55 u[IU]/mL (ref 0.350–4.500)

## 2013-11-20 LAB — PRO B NATRIURETIC PEPTIDE: PRO B NATRI PEPTIDE: 1538 pg/mL — AB (ref 0–450)

## 2013-11-20 MED ORDER — APIXABAN 5 MG PO TABS
5.0000 mg | ORAL_TABLET | Freq: Two times a day (BID) | ORAL | Status: DC
  Administered 2013-11-20 – 2013-11-21 (×2): 5 mg via ORAL
  Filled 2013-11-20 (×3): qty 1

## 2013-11-20 MED ORDER — ASPIRIN 325 MG PO TABS
325.0000 mg | ORAL_TABLET | Freq: Once | ORAL | Status: AC
  Administered 2013-11-20: 325 mg via ORAL
  Filled 2013-11-20: qty 1

## 2013-11-20 MED ORDER — DILTIAZEM HCL ER COATED BEADS 120 MG PO CP24
120.0000 mg | ORAL_CAPSULE | Freq: Every day | ORAL | Status: DC
  Filled 2013-11-20: qty 1

## 2013-11-20 MED ORDER — METOPROLOL SUCCINATE ER 50 MG PO TB24
50.0000 mg | ORAL_TABLET | Freq: Every day | ORAL | Status: DC
  Filled 2013-11-20: qty 1

## 2013-11-20 MED ORDER — NITROGLYCERIN 0.4 MG SL SUBL: 0.4000 mg | SUBLINGUAL_TABLET | SUBLINGUAL | Status: DC | PRN

## 2013-11-20 MED ORDER — DILTIAZEM HCL 25 MG/5ML IV SOLN
20.0000 mg | Freq: Once | INTRAVENOUS | Status: AC
  Administered 2013-11-20: 20 mg via INTRAVENOUS
  Filled 2013-11-20: qty 5

## 2013-11-20 MED ORDER — OMEGA-3-ACID ETHYL ESTERS 1 G PO CAPS
1.0000 g | ORAL_CAPSULE | Freq: Two times a day (BID) | ORAL | Status: DC
  Administered 2013-11-20 – 2013-11-21 (×2): 1 g via ORAL
  Filled 2013-11-20 (×3): qty 1

## 2013-11-20 MED ORDER — ASPIRIN EC 81 MG PO TBEC: 81.0000 mg | DELAYED_RELEASE_TABLET | Freq: Every day | ORAL | Status: DC

## 2013-11-20 MED ORDER — HEPARIN SODIUM (PORCINE) 5000 UNIT/ML IJ SOLN
5000.0000 [IU] | Freq: Three times a day (TID) | INTRAMUSCULAR | Status: DC
  Filled 2013-11-20 (×3): qty 1

## 2013-11-20 MED ORDER — METOPROLOL SUCCINATE ER 25 MG PO TB24
75.0000 mg | ORAL_TABLET | Freq: Every day | ORAL | Status: DC
  Filled 2013-11-20: qty 1

## 2013-11-20 MED ORDER — ADULT MULTIVITAMIN W/MINERALS CH
1.0000 | ORAL_TABLET | Freq: Every day | ORAL | Status: DC
  Administered 2013-11-20 – 2013-11-21 (×2): 1 via ORAL
  Filled 2013-11-20 (×2): qty 1

## 2013-11-20 MED ORDER — CLOPIDOGREL BISULFATE 75 MG PO TABS: 75.0000 mg | ORAL_TABLET | Freq: Every day | ORAL | Status: DC

## 2013-11-20 MED ORDER — PSYLLIUM 95 % PO PACK
1.0000 | PACK | Freq: Two times a day (BID) | ORAL | Status: DC
  Administered 2013-11-20 – 2013-11-21 (×2): 1 via ORAL
  Filled 2013-11-20 (×3): qty 1

## 2013-11-20 NOTE — H&P (Signed)
Pt. Seen and examined. Agree with the NP/PA-C note as written.  Pleasant 80 yo male with a coronary history, presents with new onset a-fib, which converted quickly after a single dose of cardizem. He had some chest pain, but it resolved when he converted to sinus. Last cath was in 2006 and he has had multiple interventions to the RCA - maintained on aspirin and plavix. He feels well now. 1st troponin is negative. BNP is mildly elevated, however, exam not consistent with CHF. He was already on a b-blocker.  This could be related to ischemia. I would recommend a lexiscan stress test in the am tomorrow to exclude ischemic etiology. Evaluate for ACS with troponins. Would recommend lowering metoprolol and starting low dose cardizem for additional rate control. A negative myoview would be helpful if we contemplated 1C anti-arryhthmic therapy in the near future.  Pixie Casino, MD, [facility] Attending Cardiologist [place]

## 2013-11-20 NOTE — Progress Notes (Signed)
ANTICOAGULATION CONSULT NOTE - Initial Consult  Pharmacy Consult for Eliquis Indication: atrial fibrillation  Allergies  Allergen Reactions  . Statins Other (See Comments)    Myalgia  . Zetia [Ezetimibe] Other (See Comments)    myalgia    Patient Measurements: Height: 6' (182.9 cm) Weight: 197 lb 12 oz (89.7 kg) IBW/kg (Calculated) : 77.6   Vital Signs: Temp: 98.1 F (36.7 C) (06/06 1600) Temp src: Oral (06/06 1600) BP: 114/62 mmHg (06/06 1600) Pulse Rate: 62 (06/06 1600)  Labs:  Recent Labs  11/20/13 1130  HGB 18.5*  HCT 51.8  PLT 197  CREATININE 1.00  TROPONINI <0.30    Estimated Creatinine Clearance: 67.9 ml/min (by C-G formula based on Cr of 1).   Medical History: Past Medical History  Diagnosis Date  . Dyslipidemia   . Lung nodule     , right upper lobe  . Obstructive sleep apnea   . Prostate cancer   . Amputated finger     L index, dew to dog bite  . Concussion   . Coronary artery disease 09/2004 and 04/2005    s/p PCI RCA with redo stent for restenosis x 2  . Hypertension   . RBBB (right bundle branch block)     Assessment: 79 YOM transferred from [place] with new onset AFib. Not on anticoagulation PTA. Baseline Hgb 18.5, platelets 197. No bleeding noted. Weight: 89kg, SCr 1mg /dL. Does not qualify for reduced dose.  Goal of Therapy:    Monitor platelets by anticoagulation protocol: Yes   Plan:  1. Eliquis 5mg  PO BID 2. CBC q72h minimum 3. Follow s/s bleeding, renal function, weight fluctuation  Lui Bellis D. Tamina Cyphers, PharmD, BCPS Clinical Pharmacist Pager: [phone] 11/20/2013 4:54 PM

## 2013-11-20 NOTE — ED Notes (Signed)
MD at bedside. 

## 2013-11-20 NOTE — ED Provider Notes (Signed)
CSN: [id number]     Arrival date & time 11/20/13  1132 History   First MD Initiated Contact with Patient 11/20/13 1138     Chief Complaint  Patient presents with  . Chest Pain     (Consider location/radiation/quality/duration/timing/severity/associated sxs/prior Treatment) Patient is a 80 y.o. male presenting with chest pain. The history is provided by the patient, a relative and the spouse. No language interpreter was used.  Chest Pain Pain location:  Substernal area Pain quality: tightness   Pain radiates to:  Does not radiate Pain radiates to the back: no   Pain severity:  Mild Duration: last night, then again this morning, "brief". Resolved last night with 1 NTG. Timing:  Intermittent Progression:  Waxing and waning Chronicity:  Recurrent Context: at rest   Relieved by:  Nitroglycerin Worsened by:  Nothing tried Ineffective treatments:  None tried Associated symptoms: near-syncope   Associated symptoms: no abdominal pain, no back pain, no cough, no dizziness, no dysphagia, no fatigue, no fever, no headache, no nausea, no numbness, no shortness of breath, not vomiting and no weakness   Risk factors: coronary artery disease, high cholesterol, hypertension and male sex   Risk factors: no birth control, no immobilization, not obese and no smoking     Past Medical History  Diagnosis Date  . Dyslipidemia   . Lung nodule     , right upper lobe  . Obstructive sleep apnea   . Prostate cancer   . Amputated finger     L index, dew to dog bite  . Concussion   . Coronary artery disease 09/2004 and 04/2005    s/p PCI RCA with redo stent for restenosis x 2  . Hypertension   . Hyperlipidemia   . RBBB (right bundle branch block)    Past Surgical History  Procedure Laterality Date  . Coronary stent placement    . Prostatectomy    . L knee ligament replacement    . Anterior cruciate ligament repair Right   . Tonsillectomy    . Tooth extraction     Family History  Problem  Relation Age of Onset  . Emphysema Mother   . Cancer Father    History  Substance Use Topics  . Smoking status: Never Smoker   . Smokeless tobacco: Never Used  . Alcohol Use: No    Review of Systems  Constitutional: Negative for fever, activity change, appetite change and fatigue.  HENT: Negative for congestion, facial swelling, rhinorrhea and trouble swallowing.   Eyes: Negative for photophobia and pain.  Respiratory: Negative for cough, chest tightness and shortness of breath.   Cardiovascular: Positive for chest pain and near-syncope. Negative for leg swelling.  Gastrointestinal: Negative for nausea, vomiting, abdominal pain, diarrhea and constipation.  Endocrine: Negative for polydipsia and polyuria.  Genitourinary: Negative for dysuria, urgency, decreased urine volume and difficulty urinating.  Musculoskeletal: Negative for back pain and gait problem.  Skin: Negative for color change, rash and wound.  Allergic/Immunologic: Negative for immunocompromised state.  Neurological: Negative for dizziness, facial asymmetry, speech difficulty, weakness, numbness and headaches.  Psychiatric/Behavioral: Negative for confusion, decreased concentration and agitation.      Allergies  Statins and Zetia  Home Medications   Prior to Admission medications   Medication Sig Start Date End Date Taking? Authorizing Provider  BETA CAROTENE PO Take 1 tablet by mouth daily.    Historical Provider, MD  brimonidine (ALPHAGAN P) 0.1 % SOLN Place 1 drop into both eyes 2 (two) times daily.  Historical Provider, MD  clopidogrel (PLAVIX) 75 MG tablet Take 75 mg by mouth daily.    Historical Provider, MD  Coenzyme Q10 (CO Q-10 PO) Take 1 tablet by mouth daily.    Historical Provider, MD  LUTEIN-ZEAXANTHIN PO Take 1 tablet by mouth daily.    Historical Provider, MD  metoprolol succinate (TOPROL-XL) 25 MG 24 hr tablet Take 75 mg by mouth daily.    Historical Provider, MD  Multiple Vitamin (MULTIVITAMIN  WITH MINERALS) TABS Take 1 tablet by mouth daily.    Historical Provider, MD  Multiple Vitamins-Minerals (ZINC PO) Take 1 tablet by mouth 2 (two) times daily. Maxi 2    Historical Provider, MD  nitroGLYCERIN (NITROSTAT) 0.4 MG SL tablet Place 0.4 mg under the tongue every 5 (five) minutes as needed. For chest pain    Historical Provider, MD  Omega-3 Fatty Acids (FISH OIL PO) Take 2 capsules by mouth 2 (two) times daily.    Historical Provider, MD  OVER THE COUNTER MEDICATION Take 1 tablet by mouth daily. Piracetam supplement for memory    Historical Provider, MD  Psyllium (METAMUCIL) 30.9 % POWD Take 1 scoop by mouth 2 (two) times daily. 1 tablespoon of powder mixed with 8 ounces of water twice daily 05/12/13   Sueanne Margarita, MD  Vitamin E-Tocotrienols (MAXILIFE RICE TOCOTRIENOLS PO) Take 125 mg by mouth 2 (two) times daily.    Historical Provider, MD   BP 110/66  Pulse 63  Temp(Src) 97.7 F (36.5 C) (Oral)  Resp 13  Wt 200 lb (90.719 kg)  SpO2 100% Physical Exam  Constitutional: He is oriented to person, place, and time. He appears well-developed and well-nourished. No distress.  HENT:  Head: Normocephalic and atraumatic.  Mouth/Throat: No oropharyngeal exudate.  Eyes: Pupils are equal, round, and reactive to light.  Neck: Normal range of motion. Neck supple.  Cardiovascular: Normal rate and normal heart sounds.  An irregularly irregular rhythm present. Exam reveals no gallop and no friction rub.   No murmur heard. Pulmonary/Chest: Effort normal and breath sounds normal. No respiratory distress. He has no wheezes. He has no rales.  Abdominal: Soft. Bowel sounds are normal. He exhibits no distension and no mass. There is no tenderness. There is no rebound and no guarding.  Musculoskeletal: Normal range of motion. He exhibits no edema and no tenderness.  Neurological: He is alert and oriented to person, place, and time.  Skin: Skin is warm and dry.  Psychiatric: He has a normal mood and  affect.    ED Course  Procedures (including critical care time) Labs Review Labs Reviewed  CBC WITH DIFFERENTIAL - Abnormal; Notable for the following:    WBC 10.9 (*)    RBC 6.24 (*)    Hemoglobin 18.5 (*)    RDW 15.6 (*)    Neutro Abs 8.3 (*)    All other components within normal limits  BASIC METABOLIC PANEL - Abnormal; Notable for the following:    Glucose, Bld 147 (*)    Calcium 10.6 (*)    GFR calc non Af Amer 70 (*)    GFR calc Af Amer 82 (*)    All other components within normal limits  PRO B NATRIURETIC PEPTIDE - Abnormal; Notable for the following:    Pro B Natriuretic peptide (BNP) 1538.0 (*)    All other components within normal limits  TROPONIN I    Imaging Review Dg Chest Port 1 View  11/20/2013   CLINICAL DATA:  Chest pain.  Dizziness.  EXAM: PORTABLE CHEST - 1 VIEW  COMPARISON:  Chest x-ray 09/17/2007.  FINDINGS: Previously noted right upper extremity PICC has been removed. Lung volumes are low. Elevation of the right hemidiaphragm (similar prior). Linear opacities in the right mid to lower lung, most compatible with subsegmental atelectasis. No confluent consolidative airspace disease. No pleural effusions. No evidence of pulmonary edema. Heart size is normal. The patient is rotated to the right on today's exam, resulting in distortion of the mediastinal contours and reduced diagnostic sensitivity and specificity for mediastinal pathology.  IMPRESSION: 1. Low lung volumes without radiographic evidence of acute cardiopulmonary disease.   Electronically Signed   By: Vinnie Langton M.D.   On: 11/20/2013 12:18     EKG Interpretation   Date/Time:  Saturday November 20 2013 12:38:33 EDT Ventricular Rate:  73 PR Interval:  156 QRS Duration: 146 QT Interval:  390 QTC Calculation: 429 R Axis:   95 Text Interpretation:  Normal sinus rhythm Right bundle branch block  Abnormal ECG Afib resolved from prior Confirmed by [place]  ([id number]) on 11/20/2013 12:41:36 PM      MDM   Final diagnoses:  Chest pain  Atrial fibrillation with rapid ventricular response    Pt is a 80 y.o. male with Pmhx as above who presents with intermittent chest tightness since last night, as well as near syncope this morning. Pt arrives in new onset afib w/ RVR 130-180, but stable BP, no current CP, nml mental status. Pt converted to NSR with 20mg  IV diltiazem bolus.  First trop negative, BNP mildly elevated. Given CP, hx of recurrent stent thrombosis, I feel he requires ACS r/o. Spoke w/ Dr. Debara Pickett w/ cardiology, will will accept in transfer to [facility].          Neta Ehlers, MD 11/22/13 1345

## 2013-11-20 NOTE — H&P (Signed)
Patient ID: Dominic [name] MRN: [id number], DOB/AGE: Jun 20, 1933   Admit date: 11/20/2013   Primary Physician:  Melinda Crutch, MD Primary Cardiologist: Dr. Radford Pax  Pt. Profile:  80 year old Caucasian male with PMH with CAD, hypertension, hyperlipidemia and a history of obstructive sleep apnea present with new onset a-fib and chest discomfort  Problem List  Past Medical History  Diagnosis Date  . Dyslipidemia   . Lung nodule     , right upper lobe  . Obstructive sleep apnea   . Prostate cancer   . Amputated finger     L index, dew to dog bite  . Concussion   . Coronary artery disease 09/2004 and 04/2005    s/p PCI RCA with redo stent for restenosis x 2  . Hypertension   . RBBB (right bundle branch block)     Past Surgical History  Procedure Laterality Date  . Coronary stent placement    . Prostatectomy    . L knee ligament replacement    . Anterior cruciate ligament repair Right   . Tonsillectomy    . Tooth extraction       Allergies  Allergies  Allergen Reactions  . Statins Other (See Comments)    Myalgia  . Zetia [Ezetimibe] Other (See Comments)    myalgia    HPI  The patient is a 80 year old Caucasian male with past medical history significant for coronary artery disease s/p cardiac catheterization and stent placement in 2006 and recurrent instent rethrombosis require repeat catheterization in 2006, hypertension, hyperlipidemia and a history of obstructive sleep apnea. Since his last cardiac catheterization in 2006, patient has been doing well, without significant exertional chest pain, shortness breath,  extremity edema, paroxysmal nocturnal dyspnea orthopnea. He denies significant drinking or history of thyroid problem. His last followup with Dr. Radford Pax was on 04/29/2013, at which time, patient was doing well without significant complaints. He states his last stress test was more a year ago.   Patient was trying to go to sleep on the night of 11/19/2013 when he  started to experience substernal chest tightness. He also has some significant dizziness and near syncope in the morning of 11/20/2013. On arrival to the ED, patient was noted to in atrial fibrillation with RVR with a heart rate of 130 to 160s. Blood pressure was stable. Troponin was negative. BNP was elevated >1500. Chest x-ray was negative for acute process. Patient was given 20 mg of IV diltiazem bolus with spontaneous conversion to normal sinus rhythm. Post conversion EKG showed normal sinus rhythm with heart rate 73 and right bundle branch block. Patient's chest discomfort also resolved spontaneously after spontaneous conversion. Given the patient's significant coronary artery disease history, he was admitted to observation.   Home Medications  Prior to Admission medications   Medication Sig Start Date End Date Taking? Authorizing Provider  clopidogrel (PLAVIX) 75 MG tablet Take 75 mg by mouth daily.   Yes Historical Provider, MD  LUTEIN-ZEAXANTHIN PO Take 1-2 tablets by mouth 2 (two) times daily. Take 2 tablets at 5:30am daily and take 1 tablet at 9am daily   Yes Historical Provider, MD  metoprolol succinate (TOPROL-XL) 25 MG 24 hr tablet Take 75 mg by mouth daily.   Yes Historical Provider, MD  nitroGLYCERIN (NITROSTAT) 0.4 MG SL tablet Place 0.4 mg under the tongue every 5 (five) minutes as needed for chest pain.    Yes Historical Provider, MD  Omega-3 Fatty Acids (FISH OIL) 1000 MG CAPS Take 2,000 mg by mouth  2 (two) times daily. 5:30am and 9:00am   Yes Historical Provider, MD  BETA CAROTENE PO Take 1 tablet by mouth daily.    Historical Provider, MD  Coenzyme Q10 (CO Q-10 PO) Take 1 tablet by mouth daily.    Historical Provider, MD  Multiple Vitamin (MULTIVITAMIN WITH MINERALS) TABS Take 1 tablet by mouth daily.    Historical Provider, MD  Multiple Vitamins-Minerals (ZINC PO) Take 1 tablet by mouth 2 (two) times daily. Maxi 2    Historical Provider, MD  Omega-3 Fatty Acids (FISH OIL PO) Take 2  capsules by mouth 2 (two) times daily.    Historical Provider, MD  OVER THE COUNTER MEDICATION Take 1 tablet by mouth daily. Piracetam supplement for memory    Historical Provider, MD  Psyllium (METAMUCIL) 30.9 % POWD Take 1 scoop by mouth 2 (two) times daily. 1 tablespoon of powder mixed with 8 ounces of water twice daily 05/12/13   Sueanne Margarita, MD  Vitamin E-Tocotrienols (MAXILIFE RICE TOCOTRIENOLS PO) Take 125 mg by mouth 2 (two) times daily.    Historical Provider, MD    Family History  Family History  Problem Relation Age of Onset  . Emphysema Mother   . Cancer Father     Social History  History   Social History  . Marital Status: Married    Spouse Name: N/A    Number of Children: N/A  . Years of Education: N/A   Occupational History  . Not on file.   Social History Main Topics  . Smoking status: Never Smoker   . Smokeless tobacco: Never Used  . Alcohol Use: No  . Drug Use: No  . Sexual Activity: Not on file   Other Topics Concern  . Not on file   Social History Narrative  . No narrative on file     Review of Systems General:  No chills, fever, night sweats or weight changes.  Cardiovascular:  No dyspnea on exertion, edema, orthopnea, palpitations, paroxysmal nocturnal dyspnea. +Chest tightness, not sure if similar to the symptom during last MI in 2006 Dermatological: No rash, lesions/masses Respiratory: No cough, dyspnea Urologic: No hematuria, dysuria Abdominal:   No nausea, vomiting, diarrhea, bright red blood per rectum, melena, or hematemesis. No h/o bleeding or recurrent fall Neurologic:  No changes in mental status. + dizziness  All other systems reviewed and are otherwise negative except as noted above.  Physical Exam  Blood pressure 114/62, pulse 62, temperature 98.1 F (36.7 C), temperature source Oral, resp. rate 18, height 6' (1.829 m), weight 197 lb 12 oz (89.7 kg), SpO2 96.00%.  General: Pleasant, NAD Psych: Normal affect. Neuro: Alert  and oriented X 3. Moves all extremities spontaneously. HEENT: Normal  Neck: Supple without bruits or JVD. Lungs:  Resp regular and unlabored, CTA. Heart: RRR no s3, s4, or murmurs. Abdomen: Soft, non-tender, non-distended, BS + x 4.  Extremities: No clubbing, cyanosis or edema. DP/PT/Radials 2+ and equal bilaterally.  Labs   Recent Labs  11/20/13 1130  TROPONINI <0.30   Lab Results  Component Value Date   WBC 10.9* 11/20/2013   HGB 18.5* 11/20/2013   HCT 51.8 11/20/2013   MCV 83.0 11/20/2013   PLT 197 11/20/2013    Recent Labs Lab 11/20/13 1130  NA 144  K 4.1  CL 108  CO2 20  BUN 22  CREATININE 1.00  CALCIUM 10.6*  GLUCOSE 147*   Lab Results  Component Value Date   CHOL 234* 05/05/2013   HDL 38.00*  05/05/2013   TRIG 166.0* 05/05/2013   No results found for this basename: DDIMER     Radiology/Studies  Dg Chest [facility]  11/20/2013   CLINICAL DATA:  Chest pain.  Dizziness.  EXAM: PORTABLE CHEST - 1 VIEW  COMPARISON:  Chest x-ray 09/17/2007.  FINDINGS: Previously noted right upper extremity PICC has been removed. Lung volumes are low. Elevation of the right hemidiaphragm (similar prior). Linear opacities in the right mid to lower lung, most compatible with subsegmental atelectasis. No confluent consolidative airspace disease. No pleural effusions. No evidence of pulmonary edema. Heart size is normal. The patient is rotated to the right on today's exam, resulting in distortion of the mediastinal contours and reduced diagnostic sensitivity and specificity for mediastinal pathology.  IMPRESSION: 1. Low lung volumes without radiographic evidence of acute cardiopulmonary disease.   Electronically Signed   By: Vinnie Langton M.D.   On: 11/20/2013 12:18    ECG  Atrial fibrillation with RVR  HR 161, RBBB  ASSESSMENT AND PLAN  1. New onset a-fib with RVR  - converted after 1 dose of 20mg  diltiazem  - check TSH, Lexiscan stress test tomorrow  - on 75mg  Toprol XL at home, HR  50-60s  - may need to increase toprol to 100mg  for better HR control  2. Chest pain in the setting of a-fib with RVR  - resolved after conversion to NSR, likely related to a-fib, however will need ischemic workup to r/o   - trend troponin overnight  3. CAD  - Cath 09/11/2001 stent to 100% mid to RCA occlusion   - Cath 09/10/2004 diffuse in-stent restenosis with overlapping DES  - Cath 05/28/2005 DES to mid RCA 90-95% stenosis  4. HTN 5. Hyperlipidemia 6. RBBB  Hilbert Corrigan, [place] 11/20/2013, 4:46 PM Pager: [phone]

## 2013-11-20 NOTE — Progress Notes (Signed)
Pt transferred  from [facility] by [facility]  And received into room 3W20. Dr. Debara Pickett paged and made aware.Dominic [name]

## 2013-11-20 NOTE — ED Notes (Signed)
Patient here with chest tightness and dizziness that started last pm. Patient denies having rapid heart rate in past, reports cardiac stent to one vessel, denies shortness of breath, no other associated symptoms

## 2013-11-21 ENCOUNTER — Observation Stay (HOSPITAL_COMMUNITY): Payer: Medicare Other

## 2013-11-21 ENCOUNTER — Other Ambulatory Visit: Payer: Self-pay | Admitting: Physician Assistant

## 2013-11-21 ENCOUNTER — Encounter (HOSPITAL_COMMUNITY): Payer: Self-pay | Admitting: Physician Assistant

## 2013-11-21 DIAGNOSIS — R079 Chest pain, unspecified: Secondary | ICD-10-CM | POA: Diagnosis not present

## 2013-11-21 DIAGNOSIS — G4721 Circadian rhythm sleep disorder, delayed sleep phase type: Secondary | ICD-10-CM | POA: Diagnosis not present

## 2013-11-21 DIAGNOSIS — I4891 Unspecified atrial fibrillation: Secondary | ICD-10-CM | POA: Diagnosis not present

## 2013-11-21 DIAGNOSIS — I2510 Atherosclerotic heart disease of native coronary artery without angina pectoris: Secondary | ICD-10-CM | POA: Diagnosis not present

## 2013-11-21 DIAGNOSIS — E785 Hyperlipidemia, unspecified: Secondary | ICD-10-CM | POA: Diagnosis not present

## 2013-11-21 LAB — BASIC METABOLIC PANEL
BUN: 27 mg/dL — ABNORMAL HIGH (ref 6–23)
CHLORIDE: 107 meq/L (ref 96–112)
CO2: 25 mEq/L (ref 19–32)
Calcium: 9.9 mg/dL (ref 8.4–10.5)
Creatinine, Ser: 0.88 mg/dL (ref 0.50–1.35)
GFR calc non Af Amer: 81 mL/min — ABNORMAL LOW (ref >90)
Glucose, Bld: 94 mg/dL (ref 70–99)
POTASSIUM: 3.8 meq/L (ref 3.7–5.3)
Sodium: 144 mEq/L (ref 137–147)

## 2013-11-21 LAB — TROPONIN I

## 2013-11-21 LAB — LIPID PANEL
Cholesterol: 234 mg/dL — ABNORMAL HIGH (ref 0–200)
HDL: 34 mg/dL — AB (ref >39)
LDL Cholesterol: 166 mg/dL — ABNORMAL HIGH (ref 0–99)
Total CHOL/HDL Ratio: 6.9 RATIO
Triglycerides: 171 mg/dL — ABNORMAL HIGH (ref <150)
VLDL: 34 mg/dL (ref 0–40)

## 2013-11-21 LAB — HEMOGLOBIN A1C
HEMOGLOBIN A1C: 5.5 % (ref <5.7)
Mean Plasma Glucose: 111 mg/dL (ref <117)

## 2013-11-21 MED ORDER — ASPIRIN 81 MG PO TBEC: 81.0000 mg | DELAYED_RELEASE_TABLET | Freq: Every day | ORAL | Status: DC

## 2013-11-21 MED ORDER — METOPROLOL SUCCINATE ER 100 MG PO TB24
100.0000 mg | ORAL_TABLET | Freq: Every day | ORAL | Status: DC
  Administered 2013-11-21: 100 mg via ORAL
  Filled 2013-11-21: qty 1

## 2013-11-21 MED ORDER — APIXABAN 5 MG PO TABS: 5.0000 mg | ORAL_TABLET | Freq: Two times a day (BID) | ORAL | Status: DC

## 2013-11-21 MED ORDER — ASPIRIN EC 81 MG PO TBEC
81.0000 mg | DELAYED_RELEASE_TABLET | Freq: Every day | ORAL | Status: DC
  Administered 2013-11-21: 81 mg via ORAL
  Filled 2013-11-21: qty 1

## 2013-11-21 MED ORDER — REGADENOSON 0.4 MG/5ML IV SOLN
INTRAVENOUS | Status: AC
  Administered 2013-11-21: 0.4 mg
  Filled 2013-11-21: qty 5

## 2013-11-21 MED ORDER — METOPROLOL SUCCINATE ER 100 MG PO TB24: 100.0000 mg | ORAL_TABLET | Freq: Every day | ORAL | Status: DC

## 2013-11-21 MED ORDER — TECHNETIUM TC 99M SESTAMIBI - CARDIOLITE
30.0000 | Freq: Once | INTRAVENOUS | Status: AC | PRN
  Administered 2013-11-21: 30 via INTRAVENOUS

## 2013-11-21 MED ORDER — TECHNETIUM TC 99M SESTAMIBI - CARDIOLITE
10.0000 | Freq: Once | INTRAVENOUS | Status: AC | PRN
  Administered 2013-11-21: 10 via INTRAVENOUS

## 2013-11-21 NOTE — Discharge Instructions (Signed)
Please call the office tomorrow to ask someone to help with prior authorization of your Eliquis.

## 2013-11-21 NOTE — Care Management Note (Signed)
    Page 1 of 1   11/21/2013     3:05:00 PM CARE MANAGEMENT NOTE 11/21/2013  Patient:  Dominic [name], Dominic [name]   Account Number:  [id number]  Date Initiated:  11/21/2013  Documentation initiated by:  [facility]  Subjective/Objective Assessment:   adm: New onset atrial fibrillation with RVR  - spont converted to NSR     Action/Plan:   discharge planning   Anticipated DC Date:  11/21/2013   Anticipated DC Plan:  [place]  CM consult      Choice offered to / List presented to:             Status of service:  Completed, signed off Medicare Important Message given?   (If response is "NO", the following Medicare IM given date fields will be blank) Date Medicare IM given:   Date Additional Medicare IM given:    Discharge Disposition:  HOME/SELF CARE  Per UR Regulation:    If discussed at Long Length of Stay Meetings, dates discussed:    Comments:  11/21/13 14:55 CM met with pt and pt's spouse and gave him a 30 day free trial Eliquis card.  CM activated the card for pt.  Pt verbalized understanding the reason of the 30 days free is to give him time to go to his follow up appt and ask the office staff to call his insurance and get pre-authorization for the medication and he can then get refills.  No other CM needs were communicated.  Mariane Masters, [place], [place].

## 2013-11-21 NOTE — Discharge Summary (Signed)
Discharge Summary   Patient ID: Dominic [name] MRN: [id number], DOB/AGE: 18-Nov-1933 80 y.o. Admit date: 11/20/2013 D/C date:     11/21/2013  Primary Care Provider:  Melinda Crutch, MD Primary Cardiologist: Radford Pax  Primary Discharge Diagnoses:  1. New onset atrial fibrillation with RVR - spont converted to NSR 2. Chest pain in the setting of AF, resolved after conversion - low risk nuc, prior scar but no ischemia, EF 56%  3. CAD - s/p PCI to the RCA (originally in 2003, with ISR s/p stenting in both 3/06 and 12/06) 4. HTN 5. Dyslipidemia, intolerant of statins 6. Known RBBB  Secondary Discharge Diagnoses:  1. Report h/o lung nodule 2. H/o amputated finger due to dog bite 3. H/o concussion  Hospital Course: Dominic [name] is a 80 year old Caucasian male with history of CAD s/p PCI to the RCA (originally in 2003, with ISR s/p stenting in both 3/06 and 12/06), OSA, HTN, dyslipidemia intolerant of statins who presented to [facility] yesterday with chest pressure and was found to be in newly recognized rapid AF. Since his last cath in 2006 he has been doing well overall. However, the night before admission he was trying to sleep when he started to experience substernal chest tightness. He also has some dizziness and near syncope in the morning of 11/20/2013. On arrival to the ED, patient was noted to in atrial fibrillation with RVR with a heart rate of 130 to 160s. Blood pressure was stable. Troponin was negative. BNP was elevated >1500 but he did not have clinical signs of CHF. Chest x-ray was negative for acute process. Patient was given 20 mg of IV diltiazem bolus with spontaneous conversion to normal sinus rhythm. Post conversion EKG showed normal sinus rhythm with heart rate 73 and right bundle branch block. Patient's chest discomfort also resolved spontaneously after spontaneous conversion. Given the patient's significant coronary artery disease history, he was admitted to observation. Troponins  remained negative. Apixaban was started (CHADSVASC = 4 for HTN, age x2, and vascular disease). There were no murmurs to suggest valvular disease and no known history of such. CXR was nonacute. Nuclear stress test was obtained given chest pressure and this was showed "negative for inducible ischemia; small fixed defect in the apical anteroseptal extending to the true apex consistent with remote infarct/scarring, no focal cardiac wall motion abnormality, calculated ejection fraction 56%; left ventricular dilatation." Initially the plan was to add scheduled diltiazem this morning but Dr. Tamala Julian reviewed the chart and recommended to titrate the patient's metoprolol upward at discharge. He recommended to DC Plavix given concomitant apixaban and start baby aspirin in place of that. He recommends anticoagulation for at least one month and potentially longer but will leave to Dr. Theodosia Blender discretion. Thyroid function was normal. Lipid panel was abnormal but statin not started due to h/o intolerance. The patient feels well today without further CP. Dr. Tamala Julian has seen and examined the patient today and feels he is stable for discharge. He received 30 day free card for Eliquis - he does require prior auth but this is not open on the weekend. I instructed him to call our office tomorrow to have one of our team members help with this since I will be off.   Discuss need for echo with Dr. Radford Pax upon discharge - will plan for this as an outpatient given late in the afternoon on a Saturday and patient stability. EF normal by nuc. I have left a message on our office's scheduling voicemail requesting  this study & a follow-up appointment, and our office will call the patient with this appointment. I also requested our office plug him into anticoag clinic for periodic monitoring while on Eliquis. Of note he was on several memory supplements which I have researched which have shown to interact with blood thinners - I have advised that  he stop these, and talk to his heart doctor before beginning any supplements.  Discharge Vitals: Blood pressure 153/70, pulse 59, temperature 97.5 F (36.4 C), temperature source Oral, resp. rate 16, height 6' (1.829 m), weight 198 lb 4.8 oz (89.948 kg), SpO2 100.00%.  Labs: Lab Results  Component Value Date   WBC 9.1 11/20/2013   HGB 16.0 11/20/2013   HCT 46.4 11/20/2013   MCV 84.4 11/20/2013   PLT 166 11/20/2013     Recent Labs Lab 11/21/13 0400  NA 144  K 3.8  CL 107  CO2 25  BUN 27*  CREATININE 0.88  CALCIUM 9.9  GLUCOSE 94    Recent Labs  11/20/13 1130 11/20/13 1645 11/20/13 2141 11/21/13 0400  TROPONINI <0.30 <0.30 <0.30 <0.30   Lab Results  Component Value Date   CHOL 234* 11/21/2013   HDL 34* 11/21/2013   LDLCALC 166* 11/21/2013   TRIG 171* 11/21/2013    Diagnostic Studies/Procedures   Nm Myocar Multi W/spect W/wall Motion / Ef  11/21/2013   CLINICAL DATA:  80 year old male with history of atrial fibrillation and coronary artery disease now with chest pain.  EXAM: MYOCARDIAL IMAGING WITH SPECT (REST AND PHARMACOLOGIC-STRESS)  GATED LEFT VENTRICULAR WALL MOTION STUDY  LEFT VENTRICULAR EJECTION FRACTION  TECHNIQUE: Standard myocardial SPECT imaging was performed after resting intravenous injection of 10 mCi Tc-40m sestamibi. Subsequently, intravenous infusion of Lexiscan was performed under the supervision of the Cardiology staff. At peak effect of the drug, 30 mCi Tc-76m sestamibi was injected intravenously and standard myocardial SPECT imaging was performed. Quantitative gated imaging was also performed to evaluate left ventricular wall motion, and estimate left ventricular ejection fraction.  COMPARISON:  Chest x-ray 11/20/2013  FINDINGS: Evaluation of the cardiac gated data demonstrates an end-diastolic volume of 932 mL and an end systolic volume of 60 mL yielding a calculated ejection fraction of 56%. No focal cardiac wall motion abnormality.  Evaluation of the static resting  and post pharmacological stress images demonstrates a fixed defect in radiotracer uptake in the anteroseptal at the apex extending to the true apex. No evidence of inducible ischemia, transient ischemic dilatation or other acute abnormality.  IMPRESSION: 1. Negative for inducible ischemia. 2. Small fixed defect in the apical anteroseptal extending to the true apex consistent with remote infarct/scarring. 3. No focal cardiac wall motion abnormality. 4. Calculated ejection fraction 56%. 5. Left ventricular dilatation.   Electronically Signed   By: Jacqulynn Cadet M.D.   On: 11/21/2013 13:43   Dg Chest Port 1 View  11/20/2013   CLINICAL DATA:  Chest pain.  Dizziness.  EXAM: PORTABLE CHEST - 1 VIEW  COMPARISON:  Chest x-ray 09/17/2007.  FINDINGS: Previously noted right upper extremity PICC has been removed. Lung volumes are low. Elevation of the right hemidiaphragm (similar prior). Linear opacities in the right mid to lower lung, most compatible with subsegmental atelectasis. No confluent consolidative airspace disease. No pleural effusions. No evidence of pulmonary edema. Heart size is normal. The patient is rotated to the right on today's exam, resulting in distortion of the mediastinal contours and reduced diagnostic sensitivity and specificity for mediastinal pathology.  IMPRESSION: 1. Low lung volumes  without radiographic evidence of acute cardiopulmonary disease.   Electronically Signed   By: Vinnie Langton M.D.   On: 11/20/2013 12:18    Discharge Medications   Current Discharge Medication List    START taking these medications   Details  apixaban (ELIQUIS) 5 MG TABS tablet Take 1 tablet (5 mg total) by mouth 2 (two) times daily. Qty: 60 tablet, Refills: 6    aspirin EC 81 MG EC tablet Take 1 tablet (81 mg total) by mouth daily. Qty: 30 tablet, Refills: 6      CONTINUE these medications which have CHANGED   Details  metoprolol succinate (TOPROL-XL) 100 MG 24 hr tablet Take 1 tablet (100 mg  total) by mouth daily. Qty: 30 tablet, Refills: 6      CONTINUE these medications which have NOT CHANGED   Details  BETA CAROTENE PO Take 1 tablet by mouth daily.    Multiple Vitamin (MULTIVITAMIN WITH MINERALS) TABS Take 1 tablet by mouth 2 (two) times daily. 5:30am and 9 am    nitroGLYCERIN (NITROSTAT) 0.4 MG SL tablet Place 0.4 mg under the tongue every 5 (five) minutes as needed for chest pain.     Omega-3 Fatty Acids (FISH OIL) 1000 MG CAPS Take 2,000 mg by mouth 2 (two) times daily. 5:30am and 9:00am    OVER THE COUNTER MEDICATION Take 1 tablet by mouth daily. Zinc    Psyllium (METAMUCIL PO) Take 15 mLs by mouth 2 (two) times daily. Mix in water and drink    Ubiquinol 100 MG CAPS Take 100 mg by mouth daily.      STOP taking these medications     clopidogrel (PLAVIX) 75 MG tablet      LUTEIN-ZEAXANTHIN PO      Piracetam POWD         Disposition   The patient will be discharged in stable condition to home. Discharge Instructions   Diet - low sodium heart healthy    Complete by:  As directed      Increase activity slowly    Complete by:  As directed   Your Lutein supplement, Piracetam supplement, and Tocotrienols supplement can interact with blood thinners, which is why we have stopped them. Talk to your heart doctor before beginning any new supplements.  We have also stopped your Plavix. You will take Eliquis and baby aspirin. Please let your doctor know immediately if you develop any unusual bleeding.          Follow-up Information   Follow up with Sueanne Margarita, MD. (Our office will call you for a follow-up appointment and heart ultrasound appointment. Please call the office if you have not heard from [place] within 3 days.)    Specialty:  Cardiology   Contact information:   [address] [place] [address] [phone]         Duration of Discharge Encounter: Greater than 30 minutes including physician and PA time.  Signed, Charlie Pitter  PA-C 11/21/2013, 3:04 PM

## 2013-11-21 NOTE — Progress Notes (Signed)
       Patient Name: Dominic [name] Date of Encounter: 11/21/2013    SUBJECTIVE: Chest discomfort has completely resolved off reversion to normal sinus rhythm which occurred after IV diltiazem in ER yesterday. He's had no recurrence of angina. He has been set up to have a myocardial perfusion study done today.  TELEMETRY:  Normal sinus Filed Vitals:   11/20/13 1352 11/20/13 1600 11/20/13 2100 11/21/13 0613  BP: 110/66 114/62 129/49 150/83  Pulse: 63 62 61 67  Temp:  98.1 F (36.7 C) 97.2 F (36.2 C) 97.1 F (36.2 C)  TempSrc:  Oral    Resp: 13 18 18 18   Height:  6' (1.829 m)    Weight:  197 lb 12 oz (89.7 kg)  198 lb 4.8 oz (89.948 kg)  SpO2: 100% 96% 96% 97%    Intake/Output Summary (Last 24 hours) at 11/21/13 0802 Last data filed at 11/21/13 0600  Gross per 24 hour  Intake      0 ml  Output    300 ml  Net   -300 ml   LABS: Basic Metabolic Panel:  Recent Labs  11/20/13 1130 11/20/13 1645 11/21/13 0400  NA 144  --  144  K 4.1  --  3.8  CL 108  --  107  CO2 20  --  25  GLUCOSE 147*  --  94  BUN 22  --  27*  CREATININE 1.00 0.95 0.88  CALCIUM 10.6*  --  9.9   CBC:  Recent Labs  11/20/13 1130 11/20/13 1645  WBC 10.9* 9.1  NEUTROABS 8.3*  --   HGB 18.5* 16.0  HCT 51.8 46.4  MCV 83.0 84.4  PLT 197 166   Cardiac Enzymes:  Recent Labs  11/20/13 1645 11/20/13 2141 11/21/13 0400  TROPONINI <0.30 <0.30 <0.30  Hemoglobin A1C:  Recent Labs  11/20/13 1645  HGBA1C 5.5   Fasting Lipid Panel:  Recent Labs  11/21/13 0400  CHOL 234*  HDL 34*  LDLCALC 166*  TRIG 171*  CHOLHDL 6.9    Radiology/Studies:  No acute  Physical Exam: Blood pressure 150/83, pulse 67, temperature 97.1 F (36.2 C), temperature source Oral, resp. rate 18, height 6' (1.829 m), weight 198 lb 4.8 oz (89.948 kg), SpO2 97.00%. Weight change:   Wt Readings from Last 3 Encounters:  11/21/13 198 lb 4.8 oz (89.948 kg)  06/02/13 197 lb (89.359 kg)  05/03/13 198 lb (89.812 kg)      Cardiac exam is unremarkable. No carotid or gallop is heard.  ASSESSMENT:  1. Acute onset atrial fibrillation, duration unknown but suspected less than 48 hours, now reverted to normal sinus rhythm 2. Chest pain resolved 3. History of coronary artery disease with prior RCA stents 4. History of sleep apnea  Plan:  1. Plan anticoagulation for at least one month and potentially longer but will leave to Dr. Theodosia Blender discretion 2. Stress nuclear today and if low risk, he can be discharged home on the higher dose of metoprolol and Eliquis  Signed, Belva Crome III 11/21/2013, 8:02 AM

## 2013-11-21 NOTE — Progress Notes (Signed)
Lexiscan nuc completed without complication.  Clarified some plans with MD regarding meds: Will d/c oral dilt (did not start yet). Increase Toprol to 100mg  daily. DC Plavix given concomitant apixaban and start baby aspirin only. Will also need 2D echocardiogram prior to dc. CM has been consulted to determine if any needs for apixaban auth. Dayna Dunn PA-C

## 2013-11-22 ENCOUNTER — Telehealth: Payer: Self-pay | Admitting: *Deleted

## 2013-11-22 ENCOUNTER — Other Ambulatory Visit: Payer: Self-pay | Admitting: *Deleted

## 2013-11-22 ENCOUNTER — Telehealth: Payer: Self-pay | Admitting: Cardiology

## 2013-11-22 MED ORDER — APIXABAN 5 MG PO TABS: 5.0000 mg | ORAL_TABLET | Freq: Two times a day (BID) | ORAL | Status: DC

## 2013-11-22 MED ORDER — NITROGLYCERIN 0.4 MG SL SUBL: 0.4000 mg | SUBLINGUAL_TABLET | SUBLINGUAL | Status: DC | PRN

## 2013-11-22 NOTE — Telephone Encounter (Signed)
Please advise -  I am not familiar with these

## 2013-11-22 NOTE — Telephone Encounter (Signed)
New problem   Pt was in hospital and was told to sched an Heart Ultrasound. Please call pt concerning this test.

## 2013-11-22 NOTE — Telephone Encounter (Signed)
The pts wife is advised that the pt has an echo scheduled on 12/13/13 at 2 pm and that the pt needs to arrive at 1:45. She verbalized understanding.

## 2013-11-22 NOTE — Telephone Encounter (Signed)
To Dr. Turner, please advise.  

## 2013-11-22 NOTE — Telephone Encounter (Signed)
Patients wife called wanting to know if the patient could still take the piracetam, lutein-zeaxanthis, and tocotrienols. She stated that he really needs these, but the hospital d/c summary has stop taking them at discharge. She is wondering if this is because they interfere with the eliquis or because the physicians there were not familiar with them. Please advise. Thanks, MI

## 2013-11-22 NOTE — Discharge Summary (Signed)
One episode AF but of unknown duration. He had spontaneous conversion, a low risk nuclear, and was asymptomatic on day of discharge. Plan anticoagulation for at least 1 month. Will discontinue antiplatelet therapy since using Eliquis. Will bump up the beta blocker dose and schedule f/u with Dr. Radford Pax in 1-2 weeks.

## 2013-11-23 NOTE — Telephone Encounter (Signed)
Please let patient know that per our pharmacists - those supplements should be ok to use

## 2013-11-23 NOTE — Telephone Encounter (Signed)
She should stop them though if any bleeding occurs

## 2013-11-23 NOTE — Telephone Encounter (Signed)
tocotrienols are vitamin E supplement which have shown to help reduce cholesterol, Lutein-zeaxanthis is used for macular degeneration, and piracetam appears to be used to help with cognition.  Tocotrienols and Lutein-Zeaxanthis appear to be safe to take along with other medications.  Piracetam I'm not as familiar with, but don't see where there should be an issue.  Patient should stop these if any bleeding issues noticed however.

## 2013-11-23 NOTE — Telephone Encounter (Signed)
Pt.notified

## 2013-11-24 DIAGNOSIS — M545 Low back pain, unspecified: Secondary | ICD-10-CM | POA: Diagnosis not present

## 2013-11-24 DIAGNOSIS — M999 Biomechanical lesion, unspecified: Secondary | ICD-10-CM | POA: Diagnosis not present

## 2013-11-29 DIAGNOSIS — M545 Low back pain, unspecified: Secondary | ICD-10-CM | POA: Diagnosis not present

## 2013-11-29 DIAGNOSIS — M999 Biomechanical lesion, unspecified: Secondary | ICD-10-CM | POA: Diagnosis not present

## 2013-12-02 DIAGNOSIS — M999 Biomechanical lesion, unspecified: Secondary | ICD-10-CM | POA: Diagnosis not present

## 2013-12-02 DIAGNOSIS — M545 Low back pain, unspecified: Secondary | ICD-10-CM | POA: Diagnosis not present

## 2013-12-06 ENCOUNTER — Other Ambulatory Visit: Payer: Self-pay | Admitting: Cardiology

## 2013-12-07 DIAGNOSIS — M999 Biomechanical lesion, unspecified: Secondary | ICD-10-CM | POA: Diagnosis not present

## 2013-12-07 DIAGNOSIS — M545 Low back pain, unspecified: Secondary | ICD-10-CM | POA: Diagnosis not present

## 2013-12-09 DIAGNOSIS — M545 Low back pain, unspecified: Secondary | ICD-10-CM | POA: Diagnosis not present

## 2013-12-09 DIAGNOSIS — M999 Biomechanical lesion, unspecified: Secondary | ICD-10-CM | POA: Diagnosis not present

## 2013-12-13 ENCOUNTER — Ambulatory Visit (HOSPITAL_COMMUNITY): Payer: Medicare Other | Attending: Cardiovascular Disease | Admitting: Radiology

## 2013-12-13 ENCOUNTER — Telehealth: Payer: Self-pay | Admitting: Cardiology

## 2013-12-13 DIAGNOSIS — R079 Chest pain, unspecified: Secondary | ICD-10-CM

## 2013-12-13 DIAGNOSIS — I2510 Atherosclerotic heart disease of native coronary artery without angina pectoris: Secondary | ICD-10-CM | POA: Insufficient documentation

## 2013-12-13 DIAGNOSIS — I10 Essential (primary) hypertension: Secondary | ICD-10-CM | POA: Diagnosis not present

## 2013-12-13 DIAGNOSIS — I4510 Unspecified right bundle-branch block: Secondary | ICD-10-CM | POA: Insufficient documentation

## 2013-12-13 DIAGNOSIS — I379 Nonrheumatic pulmonary valve disorder, unspecified: Secondary | ICD-10-CM | POA: Insufficient documentation

## 2013-12-13 DIAGNOSIS — I359 Nonrheumatic aortic valve disorder, unspecified: Secondary | ICD-10-CM | POA: Diagnosis not present

## 2013-12-13 DIAGNOSIS — I4891 Unspecified atrial fibrillation: Secondary | ICD-10-CM | POA: Insufficient documentation

## 2013-12-13 DIAGNOSIS — I517 Cardiomegaly: Secondary | ICD-10-CM | POA: Insufficient documentation

## 2013-12-13 DIAGNOSIS — E785 Hyperlipidemia, unspecified: Secondary | ICD-10-CM | POA: Diagnosis not present

## 2013-12-13 DIAGNOSIS — I25119 Atherosclerotic heart disease of native coronary artery with unspecified angina pectoris: Secondary | ICD-10-CM

## 2013-12-13 NOTE — Telephone Encounter (Signed)
New Message  Pt requests a call back to discuss having his Eliquis and Asprin stopped so that he may have dental surgery on 12/15/2013. Please call back discuss.

## 2013-12-13 NOTE — Progress Notes (Signed)
Echocardiogram performed.  

## 2013-12-13 NOTE — Telephone Encounter (Signed)
To Jeremy to advise pt.  

## 2013-12-13 NOTE — Telephone Encounter (Signed)
Spoke with pt.  He is having an extraction on 7/1. He states the dentist did not have a preference if he remained on anticoagulation or not.  Advised pt that for a simple extraction, we would prefer him to remain on Eliquis and ASA.  He is agreeable to this plan.

## 2013-12-16 ENCOUNTER — Ambulatory Visit: Payer: Managed Care, Other (non HMO) | Admitting: Physician Assistant

## 2013-12-20 ENCOUNTER — Ambulatory Visit (INDEPENDENT_AMBULATORY_CARE_PROVIDER_SITE_OTHER): Payer: Medicare Other | Admitting: Physician Assistant

## 2013-12-20 ENCOUNTER — Encounter: Payer: Self-pay | Admitting: Physician Assistant

## 2013-12-20 VITALS — BP 140/78 | HR 59 | Ht 72.0 in | Wt 202.0 lb

## 2013-12-20 DIAGNOSIS — E785 Hyperlipidemia, unspecified: Secondary | ICD-10-CM

## 2013-12-20 DIAGNOSIS — I2510 Atherosclerotic heart disease of native coronary artery without angina pectoris: Secondary | ICD-10-CM

## 2013-12-20 DIAGNOSIS — I10 Essential (primary) hypertension: Secondary | ICD-10-CM | POA: Diagnosis not present

## 2013-12-20 DIAGNOSIS — I4891 Unspecified atrial fibrillation: Secondary | ICD-10-CM | POA: Diagnosis not present

## 2013-12-20 DIAGNOSIS — I480 Paroxysmal atrial fibrillation: Secondary | ICD-10-CM

## 2013-12-20 NOTE — Patient Instructions (Signed)
NO CHANGES WERE MADE TODAY  Your physician wants you to follow-up in: 3 MONTHS WITH DR. Radford Pax. You will receive a reminder letter in the mail two months in advance. If you don't receive a letter, please call our office to schedule the follow-up appointment.

## 2013-12-20 NOTE — Progress Notes (Signed)
Cardiology Office Note    Date:  12/20/2013   ID:  Dominic, [name] 03-21-1934, MRN [id number]  PCP:   Melinda Crutch, MD  Cardiologist:  Dr. Fransico Him      History of Present Illness: Dominic [name] is a 80 y.o. male with a hx of CAD, s/p prior BMS, then Cypher DES to RCA in 08/2004.  LHC in 05/2005 with significant RCA ISR which was treated with Taxus DES to RCA 05/2005, HTN, HL, OSA. He was admitted 6/6-6/7 with AFib with RVR.  Patient had CP in the setting of AFib.  CEs remained normal.  He converted to NSR with rate control.  Inpatient Myoview was low risk without ischemia.  He was taken off of Plavix and placed on Eliquis for anticoagulation (CHADS2-VASc=4).  F/u Echo as an OP demonstrated normal LVF.  He returns for follow up.  He is doing well. The patient denies chest pain, shortness of breath, syncope, orthopnea, PND or significant pedal edema.     Studies:  - LHC (05/2005):  D1 50-60%, D2 60-70%, RCA 99% ISR; EF 65%.  PCI:  Taxus DES to RCA.  - Echo (12/13/13):  Mod LVH, EF 55-60%, no RWMA, mild AI, mild LAE.  - Nuclear (11/2013):  No ischemia, apical anteroseptal and true apex scar, EF 56%.    Recent Labs: 05/05/2013: ALT 19; Direct LDL 165.8  11/20/2013: Hemoglobin 16.0; Pro B Natriuretic peptide (BNP) 1538.0*; TSH 1.550  11/21/2013: Creatinine 0.88; HDL Cholesterol by NMR 34*; LDL (calc) 166*; Potassium 3.8   Wt Readings from Last 3 Encounters:  12/20/13 202 lb (91.627 kg)  11/21/13 198 lb 4.8 oz (89.948 kg)  06/02/13 197 lb (89.359 kg)     Past Medical History  Diagnosis Date  . Dyslipidemia     a. Intol of statins.  . Lung nodule     , right upper lobe  . Obstructive sleep apnea   . Prostate cancer   . Amputated finger     a. L index d/t to dog bite.  . Concussion   . Coronary artery disease 09/2004 and 04/2005    a. Stent to prox and mid RCA 08/2001. b. DES to RCA for ISR 08/2004. c. DES to [facility] for ISR 05/2005. d. Low risk nuc 10/2013 (done for CP in setting of  new AF).  . Hypertension   . RBBB (right bundle branch block)   . Atrial fibrillation with RVR     a. New onset diagnosed 11/20/2013, spont converted to NSR.    Current Outpatient Prescriptions  Medication Sig Dispense Refill  . apixaban (ELIQUIS) 5 MG TABS tablet Take 1 tablet (5 mg total) by mouth 2 (two) times daily.  180 tablet  0  . aspirin EC 81 MG EC tablet Take 1 tablet (81 mg total) by mouth daily.  30 tablet  6  . metoprolol succinate (TOPROL-XL) 100 MG 24 hr tablet Take 1 tablet (100 mg total) by mouth daily.  30 tablet  6  . Multiple Vitamin (MULTIVITAMIN WITH MINERALS) TABS Take 1 tablet by mouth 2 (two) times daily. 5:30am and 9 am      . nitroGLYCERIN (NITROSTAT) 0.4 MG SL tablet Place 1 tablet (0.4 mg total) under the tongue every 5 (five) minutes as needed for chest pain.  25 tablet  1  . NON FORMULARY tocotrienols are vitamin E supplement, Lutein-zeaxanthis, and piracetam      . Omega-3 Fatty Acids (FISH OIL) 1000 MG CAPS Take 2,000  mg by mouth 2 (two) times daily. 5:30am and 9:00am      . OVER THE COUNTER MEDICATION Take 1 tablet by mouth daily. Zinc      . Psyllium (METAMUCIL PO) Take 15 mLs by mouth 2 (two) times daily. Mix in water and drink      . Ubiquinol 100 MG CAPS Take 100 mg by mouth daily.       No current facility-administered medications for this visit.    Allergies:   Statins and Zetia   Social History:  The patient  reports that he has never smoked. He has never used smokeless tobacco. He reports that he does not drink alcohol or use illicit drugs.   Family History:  The patient's family history includes Cancer in his father; Emphysema in his mother.   ROS:  Please see the history of present illness.   No bleeding problems.   All other systems reviewed and negative.   PHYSICAL EXAM: VS:  BP 140/78  Pulse 59  Ht 6' (1.829 m)  Wt 202 lb (91.627 kg)  BMI 27.39 kg/m2 Well nourished, well developed, in no acute distress HEENT: normal Neck: no  JVD Cardiac:  normal S1, S2; RRR; no murmur Lungs:  clear to auscultation bilaterally, no wheezing, rhonchi or rales Abd: soft, nontender, no hepatomegaly Ext: no edema Skin: warm and dry Neuro:  CNs 2-12 intact, no focal abnormalities noted  EKG:  Sinus brady, HR 59, RBBB     ASSESSMENT AND PLAN:  1. Paroxysmal atrial fibrillation:  Maintaining NSR.  CHADS2-VASc=4.  Would maintain Eliquis given significant stroke risk profile.  He has follow up with the CVRR clinic this week as Eliquis is new.   2. CAD s/p mulitple PCIs to RCA:  No angina.  Continue ASA.  He is intolerant to statins. 3. Essential hypertension:  Controlled.  4. Hyperlipidemia:  Intol of statins.  Could consider PCSK9 when available.  Defer this to Dr. Radford Pax. 5. Disposition:  F/u with Dr. Fransico Him in 3 mos.    Signed, Versie Starks, MHS 12/20/2013 2:19 PM    [facility] [place], [place], [place]  [address] Phone: [phone]; Fax: [phone]

## 2013-12-21 DIAGNOSIS — M999 Biomechanical lesion, unspecified: Secondary | ICD-10-CM | POA: Diagnosis not present

## 2013-12-21 DIAGNOSIS — M545 Low back pain, unspecified: Secondary | ICD-10-CM | POA: Diagnosis not present

## 2013-12-23 ENCOUNTER — Encounter: Payer: Managed Care, Other (non HMO) | Admitting: Pharmacist

## 2013-12-23 ENCOUNTER — Ambulatory Visit (INDEPENDENT_AMBULATORY_CARE_PROVIDER_SITE_OTHER): Payer: Medicare Other | Admitting: *Deleted

## 2013-12-23 DIAGNOSIS — I4891 Unspecified atrial fibrillation: Secondary | ICD-10-CM

## 2013-12-23 DIAGNOSIS — I480 Paroxysmal atrial fibrillation: Secondary | ICD-10-CM

## 2013-12-23 NOTE — Progress Notes (Signed)
Pt was started on Eliquis 5mg s for Afib on 11/2013.    Reviewed patients medication list.  Pt is not currently on any combined P-gp and strong CYP3A4 inhibitors/inducers (ketoconazole, traconazole, ritonavir, carbamazepine, phenytoin, rifampin, St. John's wort).  Reviewed labs.  SCr 0.9, Weight 91.6 Kg. Age 80 yrs. Dose appropriate based on specified criteria.   Hgb and HCT 14.7/43.3.   A full discussion of the nature of anticoagulants has been carried out.  A benefit/risk analysis has been presented to the patient, so that they understand the justification for choosing anticoagulation with Eliquis at this time.  The need for compliance is stressed.  Pt is aware to take the medication twice daily.  Side effects of potential bleeding are discussed, including unusual colored urine or stools, coughing up blood or coffee ground emesis, nose bleeds or serious fall or head trauma.  Discussed signs and symptoms of stroke. The patient should avoid any OTC items containing aspirin or ibuprofen.  Avoid alcohol consumption.   Call if any signs of abnormal bleeding.  Discussed financial obligations and resolved any difficulty in obtaining medication.  Follow with Dr Radford Pax in October.

## 2013-12-24 LAB — BASIC METABOLIC PANEL
BUN: 20 mg/dL (ref 6–23)
CALCIUM: 9.8 mg/dL (ref 8.4–10.5)
CO2: 29 mEq/L (ref 19–32)
Chloride: 110 mEq/L (ref 96–112)
Creatinine, Ser: 0.9 mg/dL (ref 0.4–1.5)
GFR: 85.68 mL/min (ref >60.00)
GLUCOSE: 91 mg/dL (ref 70–99)
POTASSIUM: 4.5 meq/L (ref 3.5–5.1)
SODIUM: 143 meq/L (ref 135–145)

## 2013-12-24 LAB — CBC
HCT: 43.3 % (ref 39.0–52.0)
Hemoglobin: 14.7 g/dL (ref 13.0–17.0)
MCHC: 33.9 g/dL (ref 30.0–36.0)
MCV: 85.7 fl (ref 78.0–100.0)
Platelets: 210 10*3/uL (ref 150.0–400.0)
RBC: 5.05 Mil/uL (ref 4.22–5.81)
RDW: 14.1 % (ref 11.5–15.5)
WBC: 4.7 10*3/uL (ref 4.0–10.5)

## 2013-12-28 ENCOUNTER — Encounter: Payer: Self-pay | Admitting: General Surgery

## 2013-12-28 DIAGNOSIS — M999 Biomechanical lesion, unspecified: Secondary | ICD-10-CM | POA: Diagnosis not present

## 2013-12-28 DIAGNOSIS — M545 Low back pain, unspecified: Secondary | ICD-10-CM | POA: Diagnosis not present

## 2013-12-31 ENCOUNTER — Encounter (INDEPENDENT_AMBULATORY_CARE_PROVIDER_SITE_OTHER): Payer: Medicare Other | Admitting: Ophthalmology

## 2013-12-31 DIAGNOSIS — H43819 Vitreous degeneration, unspecified eye: Secondary | ICD-10-CM

## 2013-12-31 DIAGNOSIS — H3530 Unspecified macular degeneration: Secondary | ICD-10-CM

## 2013-12-31 DIAGNOSIS — I10 Essential (primary) hypertension: Secondary | ICD-10-CM

## 2013-12-31 DIAGNOSIS — H35039 Hypertensive retinopathy, unspecified eye: Secondary | ICD-10-CM | POA: Diagnosis not present

## 2013-12-31 DIAGNOSIS — H2510 Age-related nuclear cataract, unspecified eye: Secondary | ICD-10-CM

## 2014-01-03 DIAGNOSIS — M999 Biomechanical lesion, unspecified: Secondary | ICD-10-CM | POA: Diagnosis not present

## 2014-01-03 DIAGNOSIS — M545 Low back pain, unspecified: Secondary | ICD-10-CM | POA: Diagnosis not present

## 2014-01-13 DIAGNOSIS — M545 Low back pain, unspecified: Secondary | ICD-10-CM | POA: Diagnosis not present

## 2014-01-13 DIAGNOSIS — M999 Biomechanical lesion, unspecified: Secondary | ICD-10-CM | POA: Diagnosis not present

## 2014-01-24 ENCOUNTER — Telehealth: Payer: Self-pay | Admitting: Pharmacist

## 2014-01-24 NOTE — Telephone Encounter (Signed)
Wife called to ask if it was normal to bruise on Eliquis 5 mg bid he started in 11/2013. Stopped fish oil in 12/2013.  On aspirin + Eliquis currently given h/o DES.  Hemoglobin and BMET stable since starting Eliquis.  Patient had bruising on plavix in the past, and states the bruising is similar to when he was on plavix.  Denies any bleeding in stool or urine.  Weight 91 kg, age 80 y.o., Scr 0.91, so Eliquis 5 mg bid is appropriate dose.    Patient will continue current medications, but will call back if he starts noticing blood in urine, stool, or any other evidence of acting bleeding.  Sees Dr. Radford Pax in 7-8 weeks, and wife will speak with her about duration of therapy of blood thinners.    To Dr. Radford Pax as Juluis Rainier.

## 2014-01-28 ENCOUNTER — Other Ambulatory Visit: Payer: Self-pay | Admitting: Cardiology

## 2014-02-14 ENCOUNTER — Ambulatory Visit (HOSPITAL_COMMUNITY)
Admission: RE | Admit: 2014-02-14 | Discharge: 2014-02-14 | Disposition: A | Discharge status: Home / Self Care | Payer: Medicare Other | Source: Ambulatory Visit | Attending: Chiropractic Medicine | Admitting: Chiropractic Medicine

## 2014-02-14 ENCOUNTER — Other Ambulatory Visit (HOSPITAL_COMMUNITY): Payer: Self-pay | Admitting: Chiropractic Medicine

## 2014-02-14 DIAGNOSIS — M5137 Other intervertebral disc degeneration, lumbosacral region: Secondary | ICD-10-CM | POA: Diagnosis not present

## 2014-02-14 DIAGNOSIS — M47817 Spondylosis without myelopathy or radiculopathy, lumbosacral region: Secondary | ICD-10-CM | POA: Insufficient documentation

## 2014-02-14 DIAGNOSIS — M545 Low back pain, unspecified: Secondary | ICD-10-CM | POA: Insufficient documentation

## 2014-02-14 DIAGNOSIS — M999 Biomechanical lesion, unspecified: Secondary | ICD-10-CM | POA: Diagnosis not present

## 2014-02-14 DIAGNOSIS — M722 Plantar fascial fibromatosis: Secondary | ICD-10-CM | POA: Diagnosis not present

## 2014-03-22 ENCOUNTER — Ambulatory Visit (INDEPENDENT_AMBULATORY_CARE_PROVIDER_SITE_OTHER): Payer: Medicare Other | Admitting: Cardiology

## 2014-03-22 ENCOUNTER — Encounter: Payer: Self-pay | Admitting: Cardiology

## 2014-03-22 VITALS — BP 150/75 | HR 58 | Ht 72.0 in | Wt 200.0 lb

## 2014-03-22 DIAGNOSIS — E785 Hyperlipidemia, unspecified: Secondary | ICD-10-CM | POA: Diagnosis not present

## 2014-03-22 DIAGNOSIS — I4510 Unspecified right bundle-branch block: Secondary | ICD-10-CM

## 2014-03-22 DIAGNOSIS — I10 Essential (primary) hypertension: Secondary | ICD-10-CM

## 2014-03-22 DIAGNOSIS — I2510 Atherosclerotic heart disease of native coronary artery without angina pectoris: Secondary | ICD-10-CM

## 2014-03-22 DIAGNOSIS — G4733 Obstructive sleep apnea (adult) (pediatric): Secondary | ICD-10-CM | POA: Insufficient documentation

## 2014-03-22 DIAGNOSIS — I480 Paroxysmal atrial fibrillation: Secondary | ICD-10-CM | POA: Diagnosis not present

## 2014-03-22 NOTE — Progress Notes (Signed)
[address], [place], [place]  [address] Phone: [phone] Fax:  [phone]  Date:  03/22/2014   ID:  Dominic [name], Dominic [name] [birth year], MRN [id number]  PCP:   Dominic Crutch, MD  Cardiologist:  Dominic Him, MD    History of Present Illness: Dominic [name] is a 80 y.o. male with a hx of CAD, s/p prior BMS, then Cypher DES to RCA in 08/2004. LHC in 05/2005 with significant RCA ISR which was treated with Taxus DES to RCA 05/2005, HTN, HL, OSA. He was admitted 6/6-6/7 with AFib with RVR. Patient had CP in the setting of AFib. CEs remained normal. He converted to NSR with rate control. Inpatient Myoview was low risk without ischemia. He was taken off of Plavix and placed on Eliquis for anticoagulation (CHADS2-VASc=4). F/u Echo as an OP demonstrated normal LVF. He returns for follow up. He is doing well. The patient denies chest pain, shortness of breath, syncope, orthopnea, PND or significant pedal edema.   He has a history of OSA and has been on CPAP in the past but has not used it in the past few months.      Wt Readings from Last 3 Encounters:  03/22/14 200 lb (90.719 kg)  12/20/13 202 lb (91.627 kg)  11/21/13 198 lb 4.8 oz (89.948 kg)     Past Medical History  Diagnosis Date  . Dyslipidemia     a. Intol of statins.  . Lung nodule     , right upper lobe  . Obstructive sleep apnea   . Prostate cancer   . Amputated finger     a. L index d/t to dog bite.  . Concussion   . Coronary artery disease 09/2004 and 04/2005    a. Stent to prox and mid RCA 08/2001. b. DES to RCA for ISR 08/2004. c. DES to [facility] for ISR 05/2005. d. Low risk nuc 10/2013 (done for CP in setting of new AF).  . Hypertension   . RBBB (right bundle branch block)   . Atrial fibrillation with RVR     a. New onset diagnosed 11/20/2013, spont converted to NSR.  Marland Kitchen OSA (obstructive sleep apnea)     Current Outpatient Prescriptions  Medication Sig Dispense Refill  . aspirin EC 81 MG EC tablet Take 1 tablet (81 mg  total) by mouth daily.  30 tablet  6  . ELIQUIS 5 MG TABS tablet TAKE 1 TABLET TWICE A DAY  180 tablet  0  . metoprolol succinate (TOPROL-XL) 100 MG 24 hr tablet Take 1 tablet (100 mg total) by mouth daily.  30 tablet  6  . Multiple Vitamin (MULTIVITAMIN WITH MINERALS) TABS Take 1 tablet by mouth 2 (two) times daily. 5:30am and 9 am- multi vitamin has iron      . nitroGLYCERIN (NITROSTAT) 0.4 MG SL tablet Place 1 tablet (0.4 mg total) under the tongue every 5 (five) minutes as needed for chest pain.  25 tablet  1  . NON FORMULARY tocotrienols are vitamin E supplement, Lutein-zeaxanthis, and piracetam      . Psyllium (METAMUCIL PO) Take 15 mLs by mouth 2 (two) times daily. Mix in water and drink      . Ubiquinol 100 MG CAPS Take 100 mg by mouth daily.       No current facility-administered medications for this visit.    Allergies:    Allergies  Allergen Reactions  . Statins Other (See Comments)    Myalgia  . Zetia [  Ezetimibe] Other (See Comments)    myalgia    Social History:  The patient  reports that he has never smoked. He has never used smokeless tobacco. He reports that he does not drink alcohol or use illicit drugs.   Family History:  The patient's family history includes Cancer in his father; Emphysema in his mother.   ROS:  Please see the history of present illness.      All other systems reviewed and negative.   PHYSICAL EXAM: VS:  BP 150/75  Pulse 58  Ht 6' (1.829 m)  Wt 200 lb (90.719 kg)  BMI 27.12 kg/m2 Well nourished, well developed, in no acute distress HEENT: normal Neck: no JVD Cardiac:  normal S1, S2; RRR; no murmur Lungs:  clear to auscultation bilaterally, no wheezing, rhonchi or rales Abd: soft, nontender, no hepatomegaly Ext: no edema Skin: warm and dry Neuro:  CNs 2-12 intact, no focal abnormalities noted  ASSESSMENT AND PLAN:  1. Paroxysmal atrial fibrillation: Maintaining NSR. CHADS2-VASc=4. Would maintain Eliquis given significant stroke risk  profile.  Continue Toprol for suppression of PAF. 2. CAD s/p mulitple PCIs to RCA: No angina. Continue ASA. He is intolerant to statins. 3. Essential hypertension: Borderline controlled. Continue BB 4.  Hyperlipidemia: Intol of statins. Will refer to lipid clinic to consider PCSK9  ecaluation 5.  OSA on CPAP which he has not been using for several months - I encouraged [name] to get back on his CPAP and I will check a d/l in 2 months  Followup with me in 6 months    Signed, Dominic Him, MD [facility] 03/22/2014 4:09 PM

## 2014-03-22 NOTE — Patient Instructions (Signed)
Your physician recommends that you continue on your current medications as directed. Please refer to the Current Medication list given to you today.  Please resume using your cpap  We will request a download in 2 months  You have been referred to the lipid clinic   Your physician wants you to follow-up in: 22minths You will receive a reminder letter in the mail two months in advance. If you don't receive a letter, please call our office to schedule the follow-up appointment.

## 2014-03-23 ENCOUNTER — Other Ambulatory Visit: Payer: Self-pay | Admitting: *Deleted

## 2014-03-23 MED ORDER — METOPROLOL SUCCINATE ER 100 MG PO TB24: 100.0000 mg | ORAL_TABLET | Freq: Every day | ORAL | Status: DC

## 2014-03-24 DIAGNOSIS — M9903 Segmental and somatic dysfunction of lumbar region: Secondary | ICD-10-CM | POA: Diagnosis not present

## 2014-03-24 DIAGNOSIS — M545 Low back pain: Secondary | ICD-10-CM | POA: Diagnosis not present

## 2014-03-25 ENCOUNTER — Encounter (INDEPENDENT_AMBULATORY_CARE_PROVIDER_SITE_OTHER): Payer: Medicare Other | Admitting: Ophthalmology

## 2014-03-25 DIAGNOSIS — H35033 Hypertensive retinopathy, bilateral: Secondary | ICD-10-CM

## 2014-03-25 DIAGNOSIS — I10 Essential (primary) hypertension: Secondary | ICD-10-CM | POA: Diagnosis not present

## 2014-03-25 DIAGNOSIS — H3531 Nonexudative age-related macular degeneration: Secondary | ICD-10-CM | POA: Diagnosis not present

## 2014-03-25 DIAGNOSIS — H3532 Exudative age-related macular degeneration: Secondary | ICD-10-CM | POA: Diagnosis not present

## 2014-03-25 DIAGNOSIS — H43813 Vitreous degeneration, bilateral: Secondary | ICD-10-CM

## 2014-03-30 DIAGNOSIS — M9903 Segmental and somatic dysfunction of lumbar region: Secondary | ICD-10-CM | POA: Diagnosis not present

## 2014-03-30 DIAGNOSIS — M545 Low back pain: Secondary | ICD-10-CM | POA: Diagnosis not present

## 2014-04-05 ENCOUNTER — Ambulatory Visit: Payer: Medicare Other | Admitting: Pharmacist

## 2014-04-06 DIAGNOSIS — M9903 Segmental and somatic dysfunction of lumbar region: Secondary | ICD-10-CM | POA: Diagnosis not present

## 2014-04-06 DIAGNOSIS — M545 Low back pain: Secondary | ICD-10-CM | POA: Diagnosis not present

## 2014-04-14 DIAGNOSIS — M545 Low back pain: Secondary | ICD-10-CM | POA: Diagnosis not present

## 2014-04-14 DIAGNOSIS — M9903 Segmental and somatic dysfunction of lumbar region: Secondary | ICD-10-CM | POA: Diagnosis not present

## 2014-04-15 ENCOUNTER — Ambulatory Visit: Payer: Medicare Other | Admitting: Pharmacist

## 2014-04-18 DIAGNOSIS — S32402A Unspecified fracture of left acetabulum, initial encounter for closed fracture: Secondary | ICD-10-CM | POA: Diagnosis not present

## 2014-04-19 ENCOUNTER — Emergency Department (HOSPITAL_COMMUNITY): Payer: Medicare Other

## 2014-04-19 ENCOUNTER — Encounter (HOSPITAL_COMMUNITY): Payer: Self-pay

## 2014-04-19 ENCOUNTER — Inpatient Hospital Stay (HOSPITAL_COMMUNITY)
Admission: EM | Admit: 2014-04-19 | Discharge: 2014-04-27 | DRG: 964 | Disposition: A | Discharge status: Rehabilitation Facility | Payer: Medicare Other | Attending: General Surgery | Admitting: General Surgery

## 2014-04-19 DIAGNOSIS — I480 Paroxysmal atrial fibrillation: Secondary | ICD-10-CM | POA: Diagnosis present

## 2014-04-19 DIAGNOSIS — I2510 Atherosclerotic heart disease of native coronary artery without angina pectoris: Secondary | ICD-10-CM | POA: Diagnosis present

## 2014-04-19 DIAGNOSIS — E785 Hyperlipidemia, unspecified: Secondary | ICD-10-CM | POA: Diagnosis present

## 2014-04-19 DIAGNOSIS — G4733 Obstructive sleep apnea (adult) (pediatric): Secondary | ICD-10-CM | POA: Diagnosis present

## 2014-04-19 DIAGNOSIS — S32402A Unspecified fracture of left acetabulum, initial encounter for closed fracture: Secondary | ICD-10-CM | POA: Diagnosis not present

## 2014-04-19 DIAGNOSIS — D735 Infarction of spleen: Secondary | ICD-10-CM | POA: Diagnosis not present

## 2014-04-19 DIAGNOSIS — Z7982 Long term (current) use of aspirin: Secondary | ICD-10-CM | POA: Diagnosis not present

## 2014-04-19 DIAGNOSIS — D62 Acute posthemorrhagic anemia: Secondary | ICD-10-CM | POA: Diagnosis not present

## 2014-04-19 DIAGNOSIS — Z8546 Personal history of malignant neoplasm of prostate: Secondary | ICD-10-CM | POA: Diagnosis not present

## 2014-04-19 DIAGNOSIS — S32592A Other specified fracture of left pubis, initial encounter for closed fracture: Secondary | ICD-10-CM | POA: Diagnosis present

## 2014-04-19 DIAGNOSIS — R52 Pain, unspecified: Secondary | ICD-10-CM | POA: Diagnosis not present

## 2014-04-19 DIAGNOSIS — S32492A Other specified fracture of left acetabulum, initial encounter for closed fracture: Principal | ICD-10-CM | POA: Diagnosis present

## 2014-04-19 DIAGNOSIS — Z955 Presence of coronary angioplasty implant and graft: Secondary | ICD-10-CM | POA: Diagnosis not present

## 2014-04-19 DIAGNOSIS — S329XXA Fracture of unspecified parts of lumbosacral spine and pelvis, initial encounter for closed fracture: Secondary | ICD-10-CM

## 2014-04-19 DIAGNOSIS — I10 Essential (primary) hypertension: Secondary | ICD-10-CM | POA: Diagnosis not present

## 2014-04-19 DIAGNOSIS — S36031A Moderate laceration of spleen, initial encounter: Secondary | ICD-10-CM | POA: Diagnosis not present

## 2014-04-19 DIAGNOSIS — Z7901 Long term (current) use of anticoagulants: Secondary | ICD-10-CM

## 2014-04-19 DIAGNOSIS — S36029A Unspecified contusion of spleen, initial encounter: Secondary | ICD-10-CM

## 2014-04-19 DIAGNOSIS — Z79899 Other long term (current) drug therapy: Secondary | ICD-10-CM

## 2014-04-19 DIAGNOSIS — R54 Age-related physical debility: Secondary | ICD-10-CM | POA: Diagnosis not present

## 2014-04-19 DIAGNOSIS — IMO0001 Reserved for inherently not codable concepts without codable children: Secondary | ICD-10-CM

## 2014-04-19 DIAGNOSIS — S32302A Unspecified fracture of left ilium, initial encounter for closed fracture: Secondary | ICD-10-CM | POA: Diagnosis present

## 2014-04-19 DIAGNOSIS — S36031D Moderate laceration of spleen, subsequent encounter: Secondary | ICD-10-CM | POA: Diagnosis not present

## 2014-04-19 DIAGNOSIS — S3681XA Injury of peritoneum, initial encounter: Secondary | ICD-10-CM | POA: Diagnosis not present

## 2014-04-19 DIAGNOSIS — Y92410 Unspecified street and highway as the place of occurrence of the external cause: Secondary | ICD-10-CM

## 2014-04-19 DIAGNOSIS — S0091XA Abrasion of unspecified part of head, initial encounter: Secondary | ICD-10-CM | POA: Diagnosis not present

## 2014-04-19 DIAGNOSIS — S72009D Fracture of unspecified part of neck of unspecified femur, subsequent encounter for closed fracture with routine healing: Secondary | ICD-10-CM | POA: Diagnosis not present

## 2014-04-19 DIAGNOSIS — S72009A Fracture of unspecified part of neck of unspecified femur, initial encounter for closed fracture: Secondary | ICD-10-CM | POA: Diagnosis not present

## 2014-04-19 DIAGNOSIS — K5900 Constipation, unspecified: Secondary | ICD-10-CM | POA: Diagnosis present

## 2014-04-19 DIAGNOSIS — S36032A Major laceration of spleen, initial encounter: Secondary | ICD-10-CM | POA: Diagnosis present

## 2014-04-19 DIAGNOSIS — S32482A Displaced dome fracture of left acetabulum, initial encounter for closed fracture: Secondary | ICD-10-CM | POA: Diagnosis not present

## 2014-04-19 DIAGNOSIS — M25552 Pain in left hip: Secondary | ICD-10-CM | POA: Diagnosis not present

## 2014-04-19 LAB — COMPREHENSIVE METABOLIC PANEL
ALK PHOS: 111 U/L (ref 39–117)
ALT: 20 U/L (ref 0–53)
AST: 30 U/L (ref 0–37)
Albumin: 3.5 g/dL (ref 3.5–5.2)
Anion gap: 11 (ref 5–15)
BILIRUBIN TOTAL: 0.4 mg/dL (ref 0.3–1.2)
BUN: 14 mg/dL (ref 6–23)
CHLORIDE: 102 meq/L (ref 96–112)
CO2: 26 meq/L (ref 19–32)
CREATININE: 0.78 mg/dL (ref 0.50–1.35)
Calcium: 9.6 mg/dL (ref 8.4–10.5)
GFR calc Af Amer: >90 mL/min (ref >90)
GFR, EST NON AFRICAN AMERICAN: 84 mL/min — AB (ref >90)
Glucose, Bld: 101 mg/dL — ABNORMAL HIGH (ref 70–99)
Potassium: 4.5 mEq/L (ref 3.7–5.3)
Sodium: 139 mEq/L (ref 137–147)
Total Protein: 6.6 g/dL (ref 6.0–8.3)

## 2014-04-19 LAB — CBC WITH DIFFERENTIAL/PLATELET
BASOS ABS: 0 10*3/uL (ref 0.0–0.1)
Basophils Relative: 0 % (ref 0–1)
EOS PCT: 2 % (ref 0–5)
Eosinophils Absolute: 0.2 10*3/uL (ref 0.0–0.7)
HEMATOCRIT: 44.8 % (ref 39.0–52.0)
HEMOGLOBIN: 14.8 g/dL (ref 13.0–17.0)
Lymphocytes Relative: 17 % (ref 12–46)
Lymphs Abs: 1.4 10*3/uL (ref 0.7–4.0)
MCH: 27.8 pg (ref 26.0–34.0)
MCHC: 33 g/dL (ref 30.0–36.0)
MCV: 84.1 fL (ref 78.0–100.0)
MONO ABS: 0.8 10*3/uL (ref 0.1–1.0)
MONOS PCT: 10 % (ref 3–12)
NEUTROS ABS: 5.9 10*3/uL (ref 1.7–7.7)
Neutrophils Relative %: 71 % (ref 43–77)
Platelets: 163 10*3/uL (ref 150–400)
RBC: 5.33 MIL/uL (ref 4.22–5.81)
RDW: 13.4 % (ref 11.5–15.5)
WBC: 8.4 10*3/uL (ref 4.0–10.5)

## 2014-04-19 LAB — I-STAT CHEM 8, ED
BUN: 14 mg/dL (ref 6–23)
CHLORIDE: 102 meq/L (ref 96–112)
Calcium, Ion: 1.18 mmol/L (ref 1.13–1.30)
Creatinine, Ser: 0.8 mg/dL (ref 0.50–1.35)
GLUCOSE: 100 mg/dL — AB (ref 70–99)
HEMATOCRIT: 46 % (ref 39.0–52.0)
Hemoglobin: 15.6 g/dL (ref 13.0–17.0)
Potassium: 4.2 mEq/L (ref 3.7–5.3)
Sodium: 140 mEq/L (ref 137–147)
TCO2: 23 mmol/L (ref 0–100)

## 2014-04-19 LAB — PROTIME-INR
INR: 1.21 (ref 0.00–1.49)
PROTHROMBIN TIME: 15.5 s — AB (ref 11.6–15.2)

## 2014-04-19 MED ORDER — ONDANSETRON HCL 4 MG/2ML IJ SOLN: 4.0000 mg | Freq: Four times a day (QID) | INTRAMUSCULAR | Status: DC | PRN

## 2014-04-19 MED ORDER — HYDROMORPHONE HCL 1 MG/ML IJ SOLN
0.5000 mg | INTRAMUSCULAR | Status: DC | PRN
  Administered 2014-04-19 – 2014-04-22 (×11): 0.5 mg via INTRAVENOUS
  Filled 2014-04-19 (×11): qty 1

## 2014-04-19 MED ORDER — METOPROLOL SUCCINATE ER 100 MG PO TB24
100.0000 mg | ORAL_TABLET | Freq: Every day | ORAL | Status: DC
  Administered 2014-04-20 – 2014-04-26 (×7): 100 mg via ORAL
  Filled 2014-04-19 (×8): qty 1

## 2014-04-19 MED ORDER — NITROGLYCERIN 0.4 MG SL SUBL: 0.4000 mg | SUBLINGUAL_TABLET | SUBLINGUAL | Status: DC | PRN

## 2014-04-19 MED ORDER — IOHEXOL 300 MG/ML  SOLN
100.0000 mL | Freq: Once | INTRAMUSCULAR | Status: AC | PRN
  Administered 2014-04-19: 100 mL via INTRAVENOUS

## 2014-04-19 MED ORDER — DEXTROSE-NACL 5-0.9 % IV SOLN
INTRAVENOUS | Status: DC
  Administered 2014-04-19 – 2014-04-21 (×2): via INTRAVENOUS
  Administered 2014-04-21: 10 mL/h via INTRAVENOUS

## 2014-04-19 MED ORDER — MORPHINE SULFATE 4 MG/ML IJ SOLN
4.0000 mg | Freq: Once | INTRAMUSCULAR | Status: AC
  Administered 2014-04-19: 4 mg via INTRAVENOUS
  Filled 2014-04-19: qty 1

## 2014-04-19 MED ORDER — ONDANSETRON HCL 4 MG PO TABS: 4.0000 mg | ORAL_TABLET | Freq: Four times a day (QID) | ORAL | Status: DC | PRN
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
## 2014-04-20 DIAGNOSIS — S36031D Moderate laceration of spleen, subsequent encounter: Secondary | ICD-10-CM | POA: Diagnosis not present

## 2014-04-20 DIAGNOSIS — S3681XA Injury of peritoneum, initial encounter: Secondary | ICD-10-CM | POA: Diagnosis not present

## 2014-04-20 DIAGNOSIS — S329XXA Fracture of unspecified parts of lumbosacral spine and pelvis, initial encounter for closed fracture: Secondary | ICD-10-CM | POA: Diagnosis not present

## 2014-04-20 DIAGNOSIS — D62 Acute posthemorrhagic anemia: Secondary | ICD-10-CM | POA: Diagnosis not present

## 2014-04-20 LAB — CBC
HCT: 37.6 % — ABNORMAL LOW (ref 39.0–52.0)
Hemoglobin: 12.8 g/dL — ABNORMAL LOW (ref 13.0–17.0)
MCH: 28.4 pg (ref 26.0–34.0)
MCHC: 34 g/dL (ref 30.0–36.0)
MCV: 83.6 fL (ref 78.0–100.0)
Platelets: 156 10*3/uL (ref 150–400)
RBC: 4.5 MIL/uL (ref 4.22–5.81)
RDW: 13.5 % (ref 11.5–15.5)
WBC: 9.9 10*3/uL (ref 4.0–10.5)

## 2014-04-20 LAB — MRSA PCR SCREENING: MRSA by PCR: NEGATIVE

## 2014-04-20 MED ORDER — PNEUMOCOCCAL VAC POLYVALENT 25 MCG/0.5ML IJ INJ
0.5000 mL | INJECTION | INTRAMUSCULAR | Status: DC
  Filled 2014-04-20: qty 0.5

## 2014-04-20 MED ORDER — CETYLPYRIDINIUM CHLORIDE 0.05 % MT LIQD
7.0000 mL | Freq: Two times a day (BID) | OROMUCOSAL | Status: DC
  Administered 2014-04-20 – 2014-04-26 (×14): 7 mL via OROMUCOSAL

## 2014-04-20 NOTE — Plan of Care (Signed)
Problem: Consults Goal: Hip/Femur Fracture Patient Education See Patient Education Module for education specifics. Outcome: Completed/Met Date Met:  04/20/14 Goal: Skin Care Protocol Initiated - if Braden Score 18 or less If consults are not indicated, leave blank or document N/A Outcome: Not Applicable Date Met:  71/95/97 Goal: Nutrition Consult-if indicated Outcome: Not Applicable Date Met:  47/18/55 Goal: Diabetes Guidelines if Diabetic/Glucose > 140 If diabetic or lab glucose is > 140 mg/dl - Initiate Diabetes/Hyperglycemia Guidelines & Document Interventions  Outcome: Not Applicable Date Met:  01/58/68  Problem: Phase I Progression Outcomes Goal: Pre op pain controlled with appropriate interventions Outcome: Completed/Met Date Met:  04/20/14 Goal: Pre op Medical MD consult, if indicated Outcome: Completed/Met Date Met:  04/20/14
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
## 2014-04-20 NOTE — Progress Notes (Signed)
Received report from [place] Perkins, [place] at 2330. Resumed care for pt at this time. Johnsie Cancel, RN

## 2014-04-20 NOTE — Progress Notes (Signed)
PT Cancellation Note  Patient Details Name: Dominic [name] MRN: [id number] DOB: [birth year]   Cancelled Treatment:    Reason Eval/Treat Not Completed: Patient not medically ready (active bedrest x2 today, will await updated activity orders.)  Duncan Dull 04/20/2014, 3:46 PM Alben Deeds, [place] DPT  [phone]

## 2014-04-20 NOTE — ED Notes (Signed)
Report attempted 

## 2014-04-20 NOTE — Progress Notes (Signed)
UR completed 

## 2014-04-20 NOTE — Progress Notes (Signed)
  I discussed this case with Dr. Marcelino Scot and we will continue the current plan:  -Non-op treatment of acetabular fracture and rami fracture -TDWB -mobilize with PT -no orthopedic contraindication to chemical dvt px.   -F/u with me in 1-2 wks for repeat xray    Edmonia Lynch, D Cell [phone]

## 2014-04-21 DIAGNOSIS — S36031A Moderate laceration of spleen, initial encounter: Secondary | ICD-10-CM | POA: Diagnosis not present

## 2014-04-21 DIAGNOSIS — I480 Paroxysmal atrial fibrillation: Secondary | ICD-10-CM | POA: Diagnosis present

## 2014-04-21 DIAGNOSIS — I2510 Atherosclerotic heart disease of native coronary artery without angina pectoris: Secondary | ICD-10-CM | POA: Diagnosis present

## 2014-04-21 DIAGNOSIS — S36031D Moderate laceration of spleen, subsequent encounter: Secondary | ICD-10-CM | POA: Diagnosis not present

## 2014-04-21 DIAGNOSIS — S36032A Major laceration of spleen, initial encounter: Secondary | ICD-10-CM | POA: Diagnosis present

## 2014-04-21 DIAGNOSIS — Z7982 Long term (current) use of aspirin: Secondary | ICD-10-CM | POA: Diagnosis not present

## 2014-04-21 DIAGNOSIS — S3681XA Injury of peritoneum, initial encounter: Secondary | ICD-10-CM | POA: Diagnosis not present

## 2014-04-21 DIAGNOSIS — D62 Acute posthemorrhagic anemia: Secondary | ICD-10-CM | POA: Diagnosis not present

## 2014-04-21 DIAGNOSIS — I10 Essential (primary) hypertension: Secondary | ICD-10-CM | POA: Diagnosis present

## 2014-04-21 DIAGNOSIS — K5900 Constipation, unspecified: Secondary | ICD-10-CM | POA: Diagnosis present

## 2014-04-21 DIAGNOSIS — S32302A Unspecified fracture of left ilium, initial encounter for closed fracture: Secondary | ICD-10-CM | POA: Diagnosis present

## 2014-04-21 DIAGNOSIS — R54 Age-related physical debility: Secondary | ICD-10-CM | POA: Diagnosis not present

## 2014-04-21 DIAGNOSIS — S329XXA Fracture of unspecified parts of lumbosacral spine and pelvis, initial encounter for closed fracture: Secondary | ICD-10-CM | POA: Diagnosis not present

## 2014-04-21 DIAGNOSIS — M25552 Pain in left hip: Secondary | ICD-10-CM | POA: Diagnosis present

## 2014-04-21 DIAGNOSIS — S72009D Fracture of unspecified part of neck of unspecified femur, subsequent encounter for closed fracture with routine healing: Secondary | ICD-10-CM | POA: Diagnosis not present

## 2014-04-21 DIAGNOSIS — Z8546 Personal history of malignant neoplasm of prostate: Secondary | ICD-10-CM | POA: Diagnosis not present

## 2014-04-21 DIAGNOSIS — G4733 Obstructive sleep apnea (adult) (pediatric): Secondary | ICD-10-CM | POA: Diagnosis present

## 2014-04-21 DIAGNOSIS — S32592A Other specified fracture of left pubis, initial encounter for closed fracture: Secondary | ICD-10-CM | POA: Diagnosis present

## 2014-04-21 DIAGNOSIS — S32492A Other specified fracture of left acetabulum, initial encounter for closed fracture: Secondary | ICD-10-CM | POA: Diagnosis present

## 2014-04-21 DIAGNOSIS — Y92410 Unspecified street and highway as the place of occurrence of the external cause: Secondary | ICD-10-CM | POA: Diagnosis not present

## 2014-04-21 DIAGNOSIS — Z955 Presence of coronary angioplasty implant and graft: Secondary | ICD-10-CM | POA: Diagnosis not present

## 2014-04-21 DIAGNOSIS — Z7901 Long term (current) use of anticoagulants: Secondary | ICD-10-CM | POA: Diagnosis not present

## 2014-04-21 DIAGNOSIS — E785 Hyperlipidemia, unspecified: Secondary | ICD-10-CM | POA: Diagnosis present

## 2014-04-21 DIAGNOSIS — Z79899 Other long term (current) drug therapy: Secondary | ICD-10-CM | POA: Diagnosis not present

## 2014-04-21 LAB — CBC
HCT: 34.3 % — ABNORMAL LOW (ref 39.0–52.0)
HCT: 34.5 % — ABNORMAL LOW (ref 39.0–52.0)
Hemoglobin: 11.6 g/dL — ABNORMAL LOW (ref 13.0–17.0)
Hemoglobin: 11.6 g/dL — ABNORMAL LOW (ref 13.0–17.0)
MCH: 28.6 pg (ref 26.0–34.0)
MCH: 28.5 pg (ref 26.0–34.0)
MCHC: 33.8 g/dL (ref 30.0–36.0)
MCHC: 33.6 g/dL (ref 30.0–36.0)
MCV: 84.5 fL (ref 78.0–100.0)
MCV: 84.8 fL (ref 78.0–100.0)
PLATELETS: 130 10*3/uL — AB (ref 150–400)
PLATELETS: 142 10*3/uL — AB (ref 150–400)
RBC: 4.06 MIL/uL — ABNORMAL LOW (ref 4.22–5.81)
RBC: 4.07 MIL/uL — ABNORMAL LOW (ref 4.22–5.81)
RDW: 13.6 % (ref 11.5–15.5)
RDW: 13.4 % (ref 11.5–15.5)
WBC: 8.8 10*3/uL (ref 4.0–10.5)
WBC: 8.8 10*3/uL (ref 4.0–10.5)

## 2014-04-21 MED ORDER — POLYETHYLENE GLYCOL 3350 17 G PO PACK
17.0000 g | PACK | Freq: Every day | ORAL | Status: DC
  Administered 2014-04-22 – 2014-04-26 (×5): 17 g via ORAL
  Filled 2014-04-21 (×6): qty 1
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
## 2014-04-21 NOTE — Progress Notes (Signed)
UR completed 
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
## 2014-04-21 NOTE — Progress Notes (Signed)
Rehab Admissions Coordinator Note:  Patient was screened by Dameian Crisman L for appropriateness for an Inpatient Acute Rehab Consult.  At this time, we are recommending Inpatient Rehab consult.  Ellison Leisure, PT Rehabilitation Admissions Coordinator [phone]  

## 2014-04-22 DIAGNOSIS — S329XXA Fracture of unspecified parts of lumbosacral spine and pelvis, initial encounter for closed fracture: Secondary | ICD-10-CM

## 2014-04-22 LAB — CBC
HCT: 34.3 % — ABNORMAL LOW (ref 39.0–52.0)
Hemoglobin: 11.5 g/dL — ABNORMAL LOW (ref 13.0–17.0)
MCH: 28.5 pg (ref 26.0–34.0)
MCHC: 33.5 g/dL (ref 30.0–36.0)
MCV: 85.1 fL (ref 78.0–100.0)
Platelets: 130 10*3/uL — ABNORMAL LOW (ref 150–400)
RBC: 4.03 MIL/uL — AB (ref 4.22–5.81)
RDW: 13.4 % (ref 11.5–15.5)
WBC: 9.9 10*3/uL (ref 4.0–10.5)

## 2014-04-22 LAB — BASIC METABOLIC PANEL
Anion gap: 10 (ref 5–15)
BUN: 14 mg/dL (ref 6–23)
CO2: 26 mEq/L (ref 19–32)
Calcium: 9 mg/dL (ref 8.4–10.5)
Chloride: 105 mEq/L (ref 96–112)
Creatinine, Ser: 0.79 mg/dL (ref 0.50–1.35)
GFR calc Af Amer: >90 mL/min (ref >90)
GFR, EST NON AFRICAN AMERICAN: 84 mL/min — AB (ref >90)
GLUCOSE: 118 mg/dL — AB (ref 70–99)
POTASSIUM: 4.6 meq/L (ref 3.7–5.3)
SODIUM: 141 meq/L (ref 137–147)

## 2014-04-22 MED ORDER — HYDROCODONE-ACETAMINOPHEN 10-325 MG PO TABS
0.5000 | ORAL_TABLET | ORAL | Status: DC | PRN
  Administered 2014-04-22: 2 via ORAL
  Filled 2014-04-22: qty 2

## 2014-04-22 MED ORDER — TRAMADOL HCL 50 MG PO TABS
100.0000 mg | ORAL_TABLET | Freq: Four times a day (QID) | ORAL | Status: DC
  Administered 2014-04-22 – 2014-04-27 (×19): 100 mg via ORAL
  Filled 2014-04-22 (×21): qty 2

## 2014-04-22 MED ORDER — HYDROMORPHONE HCL 1 MG/ML IJ SOLN: 0.5000 mg | INTRAMUSCULAR | Status: DC | PRN

## 2014-04-22 NOTE — Progress Notes (Signed)
Transferred to [address] room 22 by bed, stable, belongings with pt.

## 2014-04-22 NOTE — Progress Notes (Signed)
Rehab admissions - Evaluated for possible admission.  Currently rehab beds are full.  I will follow up on Monday for progress and bed availability.  Call me for questions.  #[phone]

## 2014-04-22 NOTE — Plan of Care (Signed)
Problem: Phase II Progression Outcomes Goal: Tolerating diet Outcome: Completed/Met Date Met:  04/22/14     
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
## 2014-04-22 NOTE — Plan of Care (Signed)
Problem: Phase II Progression Outcomes Goal: Bed to chair Outcome: Completed/Met Date Met:  04/22/14     
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
## 2014-04-22 NOTE — Plan of Care (Signed)
Problem: Phase III Progression Outcomes Goal: Pain controlled on oral analgesia Outcome: Completed/Met Date Met:  04/22/14     

## 2014-04-22 NOTE — Progress Notes (Signed)
On pain assessment, patient and wife report for PRN medication only covers patient's pain for approximately 3 hrs. The Dilaudid is ordered for every 4 hrs, discussed pain management with clinic instructor, Jodi Geralds. Made contact with Trauma PA, Legrand Como, for further direction in pain management.  Trudie Reed, Student-RN

## 2014-04-22 NOTE — Plan of Care (Signed)
Problem: Phase II Progression Outcomes Goal: Discharge plan established Outcome: Completed/Met Date Met:  04/22/14     
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
## 2014-04-23 LAB — CBC
HEMATOCRIT: 33.7 % — AB (ref 39.0–52.0)
Hemoglobin: 11.3 g/dL — ABNORMAL LOW (ref 13.0–17.0)
MCH: 28.3 pg (ref 26.0–34.0)
MCHC: 33.5 g/dL (ref 30.0–36.0)
MCV: 84.5 fL (ref 78.0–100.0)
Platelets: 151 10*3/uL (ref 150–400)
RBC: 3.99 MIL/uL — ABNORMAL LOW (ref 4.22–5.81)
RDW: 13.3 % (ref 11.5–15.5)
WBC: 8.5 10*3/uL (ref 4.0–10.5)

## 2014-04-23 MED ORDER — DOCUSATE SODIUM 100 MG PO CAPS
100.0000 mg | ORAL_CAPSULE | Freq: Two times a day (BID) | ORAL | Status: DC
  Administered 2014-04-23 – 2014-04-26 (×7): 100 mg via ORAL
  Filled 2014-04-23 (×11): qty 1

## 2014-04-23 MED ORDER — APIXABAN 5 MG PO TABS
5.0000 mg | ORAL_TABLET | Freq: Two times a day (BID) | ORAL | Status: DC
  Administered 2014-04-23 – 2014-04-26 (×7): 5 mg via ORAL
  Filled 2014-04-23 (×8): qty 1
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
## 2014-04-24 LAB — CBC
HCT: 32.5 % — ABNORMAL LOW (ref 39.0–52.0)
Hemoglobin: 10.8 g/dL — ABNORMAL LOW (ref 13.0–17.0)
MCH: 28.4 pg (ref 26.0–34.0)
MCHC: 33.2 g/dL (ref 30.0–36.0)
MCV: 85.5 fL (ref 78.0–100.0)
PLATELETS: 194 10*3/uL (ref 150–400)
RBC: 3.8 MIL/uL — AB (ref 4.22–5.81)
RDW: 13.5 % (ref 11.5–15.5)
WBC: 7.5 10*3/uL (ref 4.0–10.5)

## 2014-04-24 NOTE — Plan of Care (Signed)
Problem: Phase I Progression Outcomes Goal: Post op pain controlled with appropriate interventions Outcome: Completed/Met Date Met:  04/24/14 Goal: Post op hemodynamically stable Outcome: Completed/Met Date Met:  04/24/14 Goal: Post op CMS/Neurovascular status WDL Outcome: Completed/Met Date Met:  04/24/14 Goal: Post op clear liquids, advance diet as tolerated Outcome: Completed/Met Date Met:  04/24/14
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
## 2014-04-24 NOTE — Plan of Care (Signed)
Problem: Phase III Progression Outcomes Goal: Incision clean - minimal/no drainage Outcome: Completed/Met Date Met:  04/24/14

## 2014-04-25 LAB — CBC
HEMATOCRIT: 31.9 % — AB (ref 39.0–52.0)
Hemoglobin: 10.7 g/dL — ABNORMAL LOW (ref 13.0–17.0)
MCH: 28.3 pg (ref 26.0–34.0)
MCHC: 33.5 g/dL (ref 30.0–36.0)
MCV: 84.4 fL (ref 78.0–100.0)
PLATELETS: 201 10*3/uL (ref 150–400)
RBC: 3.78 MIL/uL — ABNORMAL LOW (ref 4.22–5.81)
RDW: 13.4 % (ref 11.5–15.5)
WBC: 7.6 10*3/uL (ref 4.0–10.5)

## 2014-04-25 MED ORDER — MAGNESIUM CITRATE PO SOLN
1.0000 | Freq: Once | ORAL | Status: AC
  Administered 2014-04-25: 1 via ORAL
  Filled 2014-04-25: qty 296

## 2014-04-25 NOTE — Progress Notes (Signed)
His pain is improving and he is moving around well with a walker. Placement is imminent.  Follow up with me in 1wk for repeat xrays   Dominic [name], D

## 2014-04-25 NOTE — Progress Notes (Signed)
Rehab admissions - Evaluated for possible admission.  I met with patient and his wife.  They would like inpatient rehab admission.  I did explain to them that there are no rehab beds available today and limited beds open over the next couple days.  I gave them rehab booklets and explained inpatient rehab versus SNF.  Call me for questions.  #[phone]
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
## 2014-04-26 NOTE — Plan of Care (Signed)
Problem: Phase III Progression Outcomes Goal: Activity at appropriate level-compared to baseline (UP IN CHAIR FOR HEMODIALYSIS)  Outcome: Completed/Met Date Met:  04/26/14
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
## 2014-04-26 NOTE — Progress Notes (Signed)
Rehab admissions - Currently inpatient rehab beds are full.  I do not expect to get a bed on rehab for the next couple days.  Recommend pursuit of rehab facility outside of [facility].  Call me for questions.  #[phone]

## 2014-04-26 NOTE — Progress Notes (Signed)
Referral for inpatient rehab sent to [facility] inpatient rehab for review and admission as soon as bed available.   Sandi Mariscal, RN BSN MHA CCM  Case Manager, Trauma Service/Unit 18M [phone]

## 2014-04-26 NOTE — Progress Notes (Signed)
Spoke with patient and his wife in pt's room about inpatient rehab at [facility] rehab unit.  They are receptive to this and wished to speak with their admissions coordinator, Jeannene Patella.  I contacted Pam while in pt's room and gave her the best phone to reach them.  She had called them within 5 minutes and wife was speaking with her when I left the room.  Await call from [facility] to advise me on the time for transport to be arranged for tomorrow.   Medicare IM (Important Message) delivered to patient today by me in anticipation of discharge.   Sandi Mariscal, RN BSN MHA CCM  Case Manager, Trauma Service/Unit 76M [phone]

## 2014-04-27 DIAGNOSIS — R339 Retention of urine, unspecified: Secondary | ICD-10-CM | POA: Diagnosis not present

## 2014-04-27 DIAGNOSIS — T402X5A Adverse effect of other opioids, initial encounter: Secondary | ICD-10-CM | POA: Diagnosis not present

## 2014-04-27 DIAGNOSIS — S32309D Unspecified fracture of unspecified ilium, subsequent encounter for fracture with routine healing: Secondary | ICD-10-CM | POA: Diagnosis not present

## 2014-04-27 DIAGNOSIS — I4510 Unspecified right bundle-branch block: Secondary | ICD-10-CM | POA: Diagnosis not present

## 2014-04-27 DIAGNOSIS — Z7409 Other reduced mobility: Secondary | ICD-10-CM | POA: Diagnosis not present

## 2014-04-27 DIAGNOSIS — E785 Hyperlipidemia, unspecified: Secondary | ICD-10-CM | POA: Diagnosis not present

## 2014-04-27 DIAGNOSIS — I4891 Unspecified atrial fibrillation: Secondary | ICD-10-CM | POA: Diagnosis not present

## 2014-04-27 DIAGNOSIS — I10 Essential (primary) hypertension: Secondary | ICD-10-CM | POA: Diagnosis not present

## 2014-04-27 DIAGNOSIS — S32402D Unspecified fracture of left acetabulum, subsequent encounter for fracture with routine healing: Secondary | ICD-10-CM | POA: Diagnosis not present

## 2014-04-27 DIAGNOSIS — I2510 Atherosclerotic heart disease of native coronary artery without angina pectoris: Secondary | ICD-10-CM | POA: Diagnosis not present

## 2014-04-27 DIAGNOSIS — G4733 Obstructive sleep apnea (adult) (pediatric): Secondary | ICD-10-CM | POA: Diagnosis not present

## 2014-04-27 DIAGNOSIS — S36039D Unspecified laceration of spleen, subsequent encounter: Secondary | ICD-10-CM | POA: Diagnosis not present

## 2014-04-27 DIAGNOSIS — S32599D Other specified fracture of unspecified pubis, subsequent encounter for fracture with routine healing: Secondary | ICD-10-CM | POA: Diagnosis not present

## 2014-04-27 DIAGNOSIS — K5909 Other constipation: Secondary | ICD-10-CM | POA: Diagnosis not present

## 2014-04-27 DIAGNOSIS — R54 Age-related physical debility: Secondary | ICD-10-CM | POA: Diagnosis not present

## 2014-04-27 MED ORDER — TRAMADOL HCL 50 MG PO TABS: 100.0000 mg | ORAL_TABLET | Freq: Four times a day (QID) | ORAL | Status: DC | PRN

## 2014-04-27 MED ORDER — POLYETHYLENE GLYCOL 3350 17 G PO PACK: 17.0000 g | PACK | Freq: Every day | ORAL | Status: DC

## 2014-04-27 NOTE — Plan of Care (Signed)
Problem: Phase III Progression Outcomes Goal: Ambulate BID with assist as able Outcome: Completed/Met Date Met:  04/27/14

## 2014-04-27 NOTE — Progress Notes (Signed)
Pt for discharge today to [facility] unit.  He will go to room C-400.  Accepting MD is Dr. Tamsen Snider.  EMTALA form completed, Medical Necessity form completed--both forms printed and at the bedside.  Discharge Summary completed, faxed to [facility] admissions coordinator.  [facility] has been contacted for transport and is en route currently.  RN has the number to call report, [phone].  PT and wife aware of plan.  Sandi Mariscal, RN BSN MHA CCM  Case Manager, Trauma Service/Unit 55M [phone]

## 2014-04-27 NOTE — Progress Notes (Signed)
Report called to receiving nurse at [facility]. Patient D/C in  Stable condition with copy of chart.

## 2014-04-27 NOTE — Discharge Instructions (Signed)
Touchdown weightbearing on your leg Call Dr. Percell Miller with any issues regarding your hip/pelfic fracture [phone])   Information on my medicine - ELIQUIS (apixaban)  This medication education was reviewed with me or my healthcare representative as part of my discharge preparation.  The pharmacist that spoke with me during my hospital stay was:  Magsam, Julaine Hua, [facility]  Why was Eliquis prescribed for you? Eliquis was prescribed for you to reduce the risk of a blood clot forming that can cause a stroke if you have a medical condition called atrial fibrillation (a type of irregular heartbeat).  What do You need to know about Eliquis ? Take your Eliquis TWICE DAILY - one tablet in the morning and one tablet in the evening with or without food. If you have difficulty swallowing the tablet whole please discuss with your pharmacist how to take the medication safely.  Take Eliquis exactly as prescribed by your doctor and DO NOT stop taking Eliquis without talking to the doctor who prescribed the medication.  Stopping may increase your risk of developing a stroke.  Refill your prescription before you run out.  After discharge, you should have regular check-up appointments with your healthcare provider that is prescribing your Eliquis.  In the future your dose may need to be changed if your kidney function or weight changes by a significant amount or as you get older.  What do you do if you miss a dose? If you miss a dose, take it as soon as you remember on the same day and resume taking twice daily.  Do not take more than one dose of ELIQUIS at the same time to make up a missed dose.  Important Safety Information A possible side effect of Eliquis is bleeding. You should call your healthcare provider right away if you experience any of the following: ? Bleeding from an injury or your nose that does not stop. ? Unusual colored urine (red or dark brown) or unusual colored stools (red or  black). ? Unusual bruising for unknown reasons. ? A serious fall or if you hit your head (even if there is no bleeding).  Some medicines may interact with Eliquis and might increase your risk of bleeding or clotting while on Eliquis. To help avoid this, consult your healthcare provider or pharmacist prior to using any new prescription or non-prescription medications, including herbals, vitamins, non-steroidal anti-inflammatory drugs (NSAIDs) and supplements.  This website has more information on Eliquis (apixaban): http://www.eliquis.com/eliquis/home  Splenic Injury A splenic injury is an injury to the spleen. The spleen is an organ located in the upper left side of the abdomen, just under the ribs. The spleen filters and cleans the blood. It also stores blood cells and destroys cells that are worn out. The spleen is involved in fighting disease. However, when the spleen is removed, your body can still fight most diseases without it.  CAUSES  A blow to the abdomen can injure the spleen. In some cases, the spleen may only be bruised with some bleeding inside the covering and around the spleen. However, sometimes an injury to the spleen causes it to break (rupture). Illness can also cause the spleen to become enlarged and rupture. Because the spleen has a lot of blood going to it, a rupture is very serious and can be life-threatening. SYMPTOMS  Often, a minor splenic injury causes no symptoms or only minor abdominal pain. When bleeding from the injury is severe, the patient's blood pressure may rapidly decrease. This may cause some  of the following problems:  Dizziness or lightheadedness.  Rapid heart rate.  Abdominal tenderness and swelling.  Fainting.  Sweating with clammy skin. DIAGNOSIS  Your caregiver may immediately know what is wrong by taking a history and doing a physical exam. If there is time, the diagnosis is often confirmed by a CT scan. Other imaging tests may also be done,  such as an ultrasound exam or sometimes an MRI scan. Lab tests may be done to check your blood and may be needed frequently for a couple days after the injury.  TREATMENT   If the blood pressure is too low, emergency surgery may be needed.  When injuries are less severe, your surgeon may decide to observe the injury, or to treat the injury with interventional radiology.Interventional radiology uses flexible tubes (catheters) to stop the bleeding from inside the blood vessel. It can only be used under certain circumstances. Your caregiver will tell you if this is an option.  One to several days in an intensive care unit (ICU) may be required. Fluid and blood levels will be monitored closely.Intravenous (IV) fluids and blood transfusions are sometimes needed. Follow-up scans may be used to check how the spleen is healing. The spleen may be able to heal itself. However, if the problems are getting worse, surgery may still be needed. HOME CARE INSTRUCTIONS  Keep all follow-up appointments as instructed by your caregiver. Even when the spleen appears to have healed well, your surgeon may want to continue following up on the injury.  Ask your caregiver if you need any immunizations. You may need certain immunizations depending on whether you had surgery, interventional radiology, or some other treatment for your spleen injury. SEEK IMMEDIATE MEDICAL CARE IF:  You have a fever.  Your abdominal pain gets worse.  You develop signs of infection, such as chills and feeling unwell. MAKE SURE YOU:  Understand these instructions.  Will watch your condition.  Will get help right away if you are not doing well or get worse. Document Released: 03/25/2006 Document Revised: 08/26/2011 Document Reviewed: 12/07/2010 [facility] Patient Information 2015 [place], [place]. This information is not intended to replace advice given to you by your health care provider. Make sure you discuss any questions you have  with your health care provider.

## 2014-04-28 ENCOUNTER — Other Ambulatory Visit: Payer: Self-pay | Admitting: Cardiology

## 2014-05-09 ENCOUNTER — Telehealth: Payer: Self-pay

## 2014-05-09 NOTE — Telephone Encounter (Signed)
No problems currently, discharged from rehab.  Pending appts with Dr. Tresa Moore, Dr. Fredonia Highland, and is PCP.  Call with questions/concerns.

## 2014-05-10 DIAGNOSIS — I480 Paroxysmal atrial fibrillation: Secondary | ICD-10-CM | POA: Diagnosis not present

## 2014-05-10 DIAGNOSIS — S32415D Nondisplaced fracture of anterior wall of left acetabulum, subsequent encounter for fracture with routine healing: Secondary | ICD-10-CM | POA: Diagnosis not present

## 2014-05-10 DIAGNOSIS — S36039D Unspecified laceration of spleen, subsequent encounter: Secondary | ICD-10-CM | POA: Diagnosis not present

## 2014-05-10 DIAGNOSIS — S32392D Other fracture of left ilium, subsequent encounter for fracture with routine healing: Secondary | ICD-10-CM | POA: Diagnosis not present

## 2014-05-10 DIAGNOSIS — I10 Essential (primary) hypertension: Secondary | ICD-10-CM | POA: Diagnosis not present

## 2014-05-10 DIAGNOSIS — S32592D Other specified fracture of left pubis, subsequent encounter for fracture with routine healing: Secondary | ICD-10-CM | POA: Diagnosis not present

## 2014-05-11 ENCOUNTER — Other Ambulatory Visit: Payer: Managed Care, Other (non HMO)

## 2014-05-11 DIAGNOSIS — S32392D Other fracture of left ilium, subsequent encounter for fracture with routine healing: Secondary | ICD-10-CM | POA: Diagnosis not present

## 2014-05-11 DIAGNOSIS — I10 Essential (primary) hypertension: Secondary | ICD-10-CM | POA: Diagnosis not present

## 2014-05-11 DIAGNOSIS — S36039D Unspecified laceration of spleen, subsequent encounter: Secondary | ICD-10-CM | POA: Diagnosis not present

## 2014-05-11 DIAGNOSIS — S32592D Other specified fracture of left pubis, subsequent encounter for fracture with routine healing: Secondary | ICD-10-CM | POA: Diagnosis not present

## 2014-05-11 DIAGNOSIS — I480 Paroxysmal atrial fibrillation: Secondary | ICD-10-CM | POA: Diagnosis not present

## 2014-05-11 DIAGNOSIS — S32415D Nondisplaced fracture of anterior wall of left acetabulum, subsequent encounter for fracture with routine healing: Secondary | ICD-10-CM | POA: Diagnosis not present

## 2014-05-13 DIAGNOSIS — I10 Essential (primary) hypertension: Secondary | ICD-10-CM | POA: Diagnosis not present

## 2014-05-13 DIAGNOSIS — S32415D Nondisplaced fracture of anterior wall of left acetabulum, subsequent encounter for fracture with routine healing: Secondary | ICD-10-CM | POA: Diagnosis not present

## 2014-05-13 DIAGNOSIS — S32392D Other fracture of left ilium, subsequent encounter for fracture with routine healing: Secondary | ICD-10-CM | POA: Diagnosis not present

## 2014-05-13 DIAGNOSIS — I480 Paroxysmal atrial fibrillation: Secondary | ICD-10-CM | POA: Diagnosis not present

## 2014-05-13 DIAGNOSIS — S36039D Unspecified laceration of spleen, subsequent encounter: Secondary | ICD-10-CM | POA: Diagnosis not present

## 2014-05-13 DIAGNOSIS — S32592D Other specified fracture of left pubis, subsequent encounter for fracture with routine healing: Secondary | ICD-10-CM | POA: Diagnosis not present

## 2014-05-16 DIAGNOSIS — S32415D Nondisplaced fracture of anterior wall of left acetabulum, subsequent encounter for fracture with routine healing: Secondary | ICD-10-CM | POA: Diagnosis not present

## 2014-05-16 DIAGNOSIS — S32592D Other specified fracture of left pubis, subsequent encounter for fracture with routine healing: Secondary | ICD-10-CM | POA: Diagnosis not present

## 2014-05-16 DIAGNOSIS — C61 Malignant neoplasm of prostate: Secondary | ICD-10-CM | POA: Diagnosis not present

## 2014-05-16 DIAGNOSIS — S32392D Other fracture of left ilium, subsequent encounter for fracture with routine healing: Secondary | ICD-10-CM | POA: Diagnosis not present

## 2014-05-16 DIAGNOSIS — R339 Retention of urine, unspecified: Secondary | ICD-10-CM | POA: Diagnosis not present

## 2014-05-16 DIAGNOSIS — I10 Essential (primary) hypertension: Secondary | ICD-10-CM | POA: Diagnosis not present

## 2014-05-16 DIAGNOSIS — I480 Paroxysmal atrial fibrillation: Secondary | ICD-10-CM | POA: Diagnosis not present

## 2014-05-16 DIAGNOSIS — S36039D Unspecified laceration of spleen, subsequent encounter: Secondary | ICD-10-CM | POA: Diagnosis not present

## 2014-05-17 DIAGNOSIS — I480 Paroxysmal atrial fibrillation: Secondary | ICD-10-CM | POA: Diagnosis not present

## 2014-05-17 DIAGNOSIS — S32592D Other specified fracture of left pubis, subsequent encounter for fracture with routine healing: Secondary | ICD-10-CM | POA: Diagnosis not present

## 2014-05-17 DIAGNOSIS — S32415D Nondisplaced fracture of anterior wall of left acetabulum, subsequent encounter for fracture with routine healing: Secondary | ICD-10-CM | POA: Diagnosis not present

## 2014-05-17 DIAGNOSIS — I10 Essential (primary) hypertension: Secondary | ICD-10-CM | POA: Diagnosis not present

## 2014-05-17 DIAGNOSIS — S36039D Unspecified laceration of spleen, subsequent encounter: Secondary | ICD-10-CM | POA: Diagnosis not present

## 2014-05-17 DIAGNOSIS — S329XXA Fracture of unspecified parts of lumbosacral spine and pelvis, initial encounter for closed fracture: Secondary | ICD-10-CM | POA: Diagnosis not present

## 2014-05-17 DIAGNOSIS — S32392D Other fracture of left ilium, subsequent encounter for fracture with routine healing: Secondary | ICD-10-CM | POA: Diagnosis not present

## 2014-05-17 DIAGNOSIS — D649 Anemia, unspecified: Secondary | ICD-10-CM | POA: Diagnosis not present

## 2014-05-18 DIAGNOSIS — I10 Essential (primary) hypertension: Secondary | ICD-10-CM | POA: Diagnosis not present

## 2014-05-18 DIAGNOSIS — I480 Paroxysmal atrial fibrillation: Secondary | ICD-10-CM | POA: Diagnosis not present

## 2014-05-18 DIAGNOSIS — S32415D Nondisplaced fracture of anterior wall of left acetabulum, subsequent encounter for fracture with routine healing: Secondary | ICD-10-CM | POA: Diagnosis not present

## 2014-05-18 DIAGNOSIS — S32592D Other specified fracture of left pubis, subsequent encounter for fracture with routine healing: Secondary | ICD-10-CM | POA: Diagnosis not present

## 2014-05-18 DIAGNOSIS — S32392D Other fracture of left ilium, subsequent encounter for fracture with routine healing: Secondary | ICD-10-CM | POA: Diagnosis not present

## 2014-05-18 DIAGNOSIS — S36039D Unspecified laceration of spleen, subsequent encounter: Secondary | ICD-10-CM | POA: Diagnosis not present

## 2014-05-20 DIAGNOSIS — S32415D Nondisplaced fracture of anterior wall of left acetabulum, subsequent encounter for fracture with routine healing: Secondary | ICD-10-CM | POA: Diagnosis not present

## 2014-05-20 DIAGNOSIS — I10 Essential (primary) hypertension: Secondary | ICD-10-CM | POA: Diagnosis not present

## 2014-05-20 DIAGNOSIS — S32592D Other specified fracture of left pubis, subsequent encounter for fracture with routine healing: Secondary | ICD-10-CM | POA: Diagnosis not present

## 2014-05-20 DIAGNOSIS — S32392D Other fracture of left ilium, subsequent encounter for fracture with routine healing: Secondary | ICD-10-CM | POA: Diagnosis not present

## 2014-05-20 DIAGNOSIS — I480 Paroxysmal atrial fibrillation: Secondary | ICD-10-CM | POA: Diagnosis not present

## 2014-05-20 DIAGNOSIS — S36039D Unspecified laceration of spleen, subsequent encounter: Secondary | ICD-10-CM | POA: Diagnosis not present

## 2014-05-24 DIAGNOSIS — S32415D Nondisplaced fracture of anterior wall of left acetabulum, subsequent encounter for fracture with routine healing: Secondary | ICD-10-CM | POA: Diagnosis not present

## 2014-05-24 DIAGNOSIS — S32392D Other fracture of left ilium, subsequent encounter for fracture with routine healing: Secondary | ICD-10-CM | POA: Diagnosis not present

## 2014-05-24 DIAGNOSIS — S36039D Unspecified laceration of spleen, subsequent encounter: Secondary | ICD-10-CM | POA: Diagnosis not present

## 2014-05-24 DIAGNOSIS — I10 Essential (primary) hypertension: Secondary | ICD-10-CM | POA: Diagnosis not present

## 2014-05-24 DIAGNOSIS — S32592D Other specified fracture of left pubis, subsequent encounter for fracture with routine healing: Secondary | ICD-10-CM | POA: Diagnosis not present

## 2014-05-24 DIAGNOSIS — I480 Paroxysmal atrial fibrillation: Secondary | ICD-10-CM | POA: Diagnosis not present

## 2014-05-25 DIAGNOSIS — S32402D Unspecified fracture of left acetabulum, subsequent encounter for fracture with routine healing: Secondary | ICD-10-CM | POA: Diagnosis not present

## 2014-05-26 DIAGNOSIS — S32392D Other fracture of left ilium, subsequent encounter for fracture with routine healing: Secondary | ICD-10-CM | POA: Diagnosis not present

## 2014-05-26 DIAGNOSIS — I10 Essential (primary) hypertension: Secondary | ICD-10-CM | POA: Diagnosis not present

## 2014-05-26 DIAGNOSIS — S32592D Other specified fracture of left pubis, subsequent encounter for fracture with routine healing: Secondary | ICD-10-CM | POA: Diagnosis not present

## 2014-05-26 DIAGNOSIS — S36039D Unspecified laceration of spleen, subsequent encounter: Secondary | ICD-10-CM | POA: Diagnosis not present

## 2014-05-26 DIAGNOSIS — I480 Paroxysmal atrial fibrillation: Secondary | ICD-10-CM | POA: Diagnosis not present

## 2014-05-26 DIAGNOSIS — S32415D Nondisplaced fracture of anterior wall of left acetabulum, subsequent encounter for fracture with routine healing: Secondary | ICD-10-CM | POA: Diagnosis not present

## 2014-05-31 DIAGNOSIS — S36039D Unspecified laceration of spleen, subsequent encounter: Secondary | ICD-10-CM | POA: Diagnosis not present

## 2014-05-31 DIAGNOSIS — I10 Essential (primary) hypertension: Secondary | ICD-10-CM | POA: Diagnosis not present

## 2014-05-31 DIAGNOSIS — S32415D Nondisplaced fracture of anterior wall of left acetabulum, subsequent encounter for fracture with routine healing: Secondary | ICD-10-CM | POA: Diagnosis not present

## 2014-05-31 DIAGNOSIS — S32592D Other specified fracture of left pubis, subsequent encounter for fracture with routine healing: Secondary | ICD-10-CM | POA: Diagnosis not present

## 2014-05-31 DIAGNOSIS — I480 Paroxysmal atrial fibrillation: Secondary | ICD-10-CM | POA: Diagnosis not present

## 2014-05-31 DIAGNOSIS — S32392D Other fracture of left ilium, subsequent encounter for fracture with routine healing: Secondary | ICD-10-CM | POA: Diagnosis not present

## 2014-06-02 DIAGNOSIS — I480 Paroxysmal atrial fibrillation: Secondary | ICD-10-CM | POA: Diagnosis not present

## 2014-06-02 DIAGNOSIS — I10 Essential (primary) hypertension: Secondary | ICD-10-CM | POA: Diagnosis not present

## 2014-06-02 DIAGNOSIS — S32592D Other specified fracture of left pubis, subsequent encounter for fracture with routine healing: Secondary | ICD-10-CM | POA: Diagnosis not present

## 2014-06-02 DIAGNOSIS — S36039D Unspecified laceration of spleen, subsequent encounter: Secondary | ICD-10-CM | POA: Diagnosis not present

## 2014-06-02 DIAGNOSIS — S32415D Nondisplaced fracture of anterior wall of left acetabulum, subsequent encounter for fracture with routine healing: Secondary | ICD-10-CM | POA: Diagnosis not present

## 2014-06-02 DIAGNOSIS — S32392D Other fracture of left ilium, subsequent encounter for fracture with routine healing: Secondary | ICD-10-CM | POA: Diagnosis not present

## 2014-06-06 DIAGNOSIS — S32392D Other fracture of left ilium, subsequent encounter for fracture with routine healing: Secondary | ICD-10-CM | POA: Diagnosis not present

## 2014-06-06 DIAGNOSIS — I10 Essential (primary) hypertension: Secondary | ICD-10-CM | POA: Diagnosis not present

## 2014-06-06 DIAGNOSIS — S36039D Unspecified laceration of spleen, subsequent encounter: Secondary | ICD-10-CM | POA: Diagnosis not present

## 2014-06-06 DIAGNOSIS — I480 Paroxysmal atrial fibrillation: Secondary | ICD-10-CM | POA: Diagnosis not present

## 2014-06-06 DIAGNOSIS — S32592D Other specified fracture of left pubis, subsequent encounter for fracture with routine healing: Secondary | ICD-10-CM | POA: Diagnosis not present

## 2014-06-06 DIAGNOSIS — S32415D Nondisplaced fracture of anterior wall of left acetabulum, subsequent encounter for fracture with routine healing: Secondary | ICD-10-CM | POA: Diagnosis not present

## 2014-06-07 DIAGNOSIS — S32402D Unspecified fracture of left acetabulum, subsequent encounter for fracture with routine healing: Secondary | ICD-10-CM | POA: Diagnosis not present

## 2014-06-07 DIAGNOSIS — M25652 Stiffness of left hip, not elsewhere classified: Secondary | ICD-10-CM | POA: Diagnosis not present

## 2014-06-07 DIAGNOSIS — M6281 Muscle weakness (generalized): Secondary | ICD-10-CM | POA: Diagnosis not present

## 2014-06-14 DIAGNOSIS — M25652 Stiffness of left hip, not elsewhere classified: Secondary | ICD-10-CM | POA: Diagnosis not present

## 2014-06-14 DIAGNOSIS — S32402D Unspecified fracture of left acetabulum, subsequent encounter for fracture with routine healing: Secondary | ICD-10-CM | POA: Diagnosis not present

## 2014-06-14 DIAGNOSIS — M6281 Muscle weakness (generalized): Secondary | ICD-10-CM | POA: Diagnosis not present

## 2014-06-15 DIAGNOSIS — S32402D Unspecified fracture of left acetabulum, subsequent encounter for fracture with routine healing: Secondary | ICD-10-CM | POA: Diagnosis not present

## 2014-06-15 DIAGNOSIS — M6281 Muscle weakness (generalized): Secondary | ICD-10-CM | POA: Diagnosis not present

## 2014-06-15 DIAGNOSIS — M25652 Stiffness of left hip, not elsewhere classified: Secondary | ICD-10-CM | POA: Diagnosis not present

## 2014-06-16 DIAGNOSIS — R54 Age-related physical debility: Secondary | ICD-10-CM | POA: Diagnosis not present

## 2014-06-20 DIAGNOSIS — M5386 Other specified dorsopathies, lumbar region: Secondary | ICD-10-CM | POA: Diagnosis not present

## 2014-06-20 DIAGNOSIS — M545 Low back pain: Secondary | ICD-10-CM | POA: Diagnosis not present

## 2014-06-20 DIAGNOSIS — M9905 Segmental and somatic dysfunction of pelvic region: Secondary | ICD-10-CM | POA: Diagnosis not present

## 2014-06-21 DIAGNOSIS — S32402D Unspecified fracture of left acetabulum, subsequent encounter for fracture with routine healing: Secondary | ICD-10-CM | POA: Diagnosis not present

## 2014-06-21 DIAGNOSIS — M6281 Muscle weakness (generalized): Secondary | ICD-10-CM | POA: Diagnosis not present

## 2014-06-21 DIAGNOSIS — M25652 Stiffness of left hip, not elsewhere classified: Secondary | ICD-10-CM | POA: Diagnosis not present

## 2014-06-22 DIAGNOSIS — S32402D Unspecified fracture of left acetabulum, subsequent encounter for fracture with routine healing: Secondary | ICD-10-CM | POA: Diagnosis not present

## 2014-06-23 DIAGNOSIS — S32402D Unspecified fracture of left acetabulum, subsequent encounter for fracture with routine healing: Secondary | ICD-10-CM | POA: Diagnosis not present

## 2014-06-23 DIAGNOSIS — M6281 Muscle weakness (generalized): Secondary | ICD-10-CM | POA: Diagnosis not present

## 2014-06-23 DIAGNOSIS — M25652 Stiffness of left hip, not elsewhere classified: Secondary | ICD-10-CM | POA: Diagnosis not present

## 2014-06-28 DIAGNOSIS — M6281 Muscle weakness (generalized): Secondary | ICD-10-CM | POA: Diagnosis not present

## 2014-06-28 DIAGNOSIS — M25652 Stiffness of left hip, not elsewhere classified: Secondary | ICD-10-CM | POA: Diagnosis not present

## 2014-06-28 DIAGNOSIS — S32402D Unspecified fracture of left acetabulum, subsequent encounter for fracture with routine healing: Secondary | ICD-10-CM | POA: Diagnosis not present

## 2014-06-30 DIAGNOSIS — M25652 Stiffness of left hip, not elsewhere classified: Secondary | ICD-10-CM | POA: Diagnosis not present

## 2014-06-30 DIAGNOSIS — M6281 Muscle weakness (generalized): Secondary | ICD-10-CM | POA: Diagnosis not present

## 2014-06-30 DIAGNOSIS — S32402D Unspecified fracture of left acetabulum, subsequent encounter for fracture with routine healing: Secondary | ICD-10-CM | POA: Diagnosis not present

## 2014-07-01 ENCOUNTER — Encounter (INDEPENDENT_AMBULATORY_CARE_PROVIDER_SITE_OTHER): Payer: Medicare Other | Admitting: Ophthalmology

## 2014-07-04 ENCOUNTER — Encounter (INDEPENDENT_AMBULATORY_CARE_PROVIDER_SITE_OTHER): Payer: Medicare Other | Admitting: Ophthalmology

## 2014-07-04 DIAGNOSIS — H35033 Hypertensive retinopathy, bilateral: Secondary | ICD-10-CM | POA: Diagnosis not present

## 2014-07-04 DIAGNOSIS — M5386 Other specified dorsopathies, lumbar region: Secondary | ICD-10-CM | POA: Diagnosis not present

## 2014-07-04 DIAGNOSIS — M545 Low back pain: Secondary | ICD-10-CM | POA: Diagnosis not present

## 2014-07-04 DIAGNOSIS — H43813 Vitreous degeneration, bilateral: Secondary | ICD-10-CM

## 2014-07-04 DIAGNOSIS — I10 Essential (primary) hypertension: Secondary | ICD-10-CM | POA: Diagnosis not present

## 2014-07-04 DIAGNOSIS — H3531 Nonexudative age-related macular degeneration: Secondary | ICD-10-CM

## 2014-07-04 DIAGNOSIS — M9905 Segmental and somatic dysfunction of pelvic region: Secondary | ICD-10-CM | POA: Diagnosis not present

## 2014-07-05 DIAGNOSIS — S32402D Unspecified fracture of left acetabulum, subsequent encounter for fracture with routine healing: Secondary | ICD-10-CM | POA: Diagnosis not present

## 2014-07-05 DIAGNOSIS — M6281 Muscle weakness (generalized): Secondary | ICD-10-CM | POA: Diagnosis not present

## 2014-07-05 DIAGNOSIS — M25652 Stiffness of left hip, not elsewhere classified: Secondary | ICD-10-CM | POA: Diagnosis not present

## 2014-07-06 ENCOUNTER — Encounter: Payer: Self-pay | Admitting: Cardiology

## 2014-07-07 DIAGNOSIS — M6281 Muscle weakness (generalized): Secondary | ICD-10-CM | POA: Diagnosis not present

## 2014-07-07 DIAGNOSIS — M25652 Stiffness of left hip, not elsewhere classified: Secondary | ICD-10-CM | POA: Diagnosis not present

## 2014-07-07 DIAGNOSIS — S32402D Unspecified fracture of left acetabulum, subsequent encounter for fracture with routine healing: Secondary | ICD-10-CM | POA: Diagnosis not present

## 2014-07-11 DIAGNOSIS — M9905 Segmental and somatic dysfunction of pelvic region: Secondary | ICD-10-CM | POA: Diagnosis not present

## 2014-07-11 DIAGNOSIS — M5386 Other specified dorsopathies, lumbar region: Secondary | ICD-10-CM | POA: Diagnosis not present

## 2014-07-11 DIAGNOSIS — M545 Low back pain: Secondary | ICD-10-CM | POA: Diagnosis not present

## 2014-07-12 DIAGNOSIS — S32402D Unspecified fracture of left acetabulum, subsequent encounter for fracture with routine healing: Secondary | ICD-10-CM | POA: Diagnosis not present

## 2014-07-12 DIAGNOSIS — M25652 Stiffness of left hip, not elsewhere classified: Secondary | ICD-10-CM | POA: Diagnosis not present

## 2014-07-12 DIAGNOSIS — M6281 Muscle weakness (generalized): Secondary | ICD-10-CM | POA: Diagnosis not present

## 2014-07-14 DIAGNOSIS — M25652 Stiffness of left hip, not elsewhere classified: Secondary | ICD-10-CM | POA: Diagnosis not present

## 2014-07-14 DIAGNOSIS — M6281 Muscle weakness (generalized): Secondary | ICD-10-CM | POA: Diagnosis not present

## 2014-07-14 DIAGNOSIS — S32402D Unspecified fracture of left acetabulum, subsequent encounter for fracture with routine healing: Secondary | ICD-10-CM | POA: Diagnosis not present

## 2014-07-19 DIAGNOSIS — M6281 Muscle weakness (generalized): Secondary | ICD-10-CM | POA: Diagnosis not present

## 2014-07-19 DIAGNOSIS — M25652 Stiffness of left hip, not elsewhere classified: Secondary | ICD-10-CM | POA: Diagnosis not present

## 2014-07-19 DIAGNOSIS — S32402D Unspecified fracture of left acetabulum, subsequent encounter for fracture with routine healing: Secondary | ICD-10-CM | POA: Diagnosis not present

## 2014-07-20 DIAGNOSIS — M545 Low back pain: Secondary | ICD-10-CM | POA: Diagnosis not present

## 2014-07-20 DIAGNOSIS — M9905 Segmental and somatic dysfunction of pelvic region: Secondary | ICD-10-CM | POA: Diagnosis not present

## 2014-07-20 DIAGNOSIS — M5386 Other specified dorsopathies, lumbar region: Secondary | ICD-10-CM | POA: Diagnosis not present

## 2014-07-21 DIAGNOSIS — M25652 Stiffness of left hip, not elsewhere classified: Secondary | ICD-10-CM | POA: Diagnosis not present

## 2014-07-21 DIAGNOSIS — M6281 Muscle weakness (generalized): Secondary | ICD-10-CM | POA: Diagnosis not present

## 2014-07-21 DIAGNOSIS — S32402D Unspecified fracture of left acetabulum, subsequent encounter for fracture with routine healing: Secondary | ICD-10-CM | POA: Diagnosis not present

## 2014-07-26 DIAGNOSIS — M25652 Stiffness of left hip, not elsewhere classified: Secondary | ICD-10-CM | POA: Diagnosis not present

## 2014-07-26 DIAGNOSIS — S32402D Unspecified fracture of left acetabulum, subsequent encounter for fracture with routine healing: Secondary | ICD-10-CM | POA: Diagnosis not present

## 2014-07-26 DIAGNOSIS — M6281 Muscle weakness (generalized): Secondary | ICD-10-CM | POA: Diagnosis not present

## 2014-07-28 DIAGNOSIS — S32402D Unspecified fracture of left acetabulum, subsequent encounter for fracture with routine healing: Secondary | ICD-10-CM | POA: Diagnosis not present

## 2014-07-28 DIAGNOSIS — M25652 Stiffness of left hip, not elsewhere classified: Secondary | ICD-10-CM | POA: Diagnosis not present

## 2014-07-28 DIAGNOSIS — M6281 Muscle weakness (generalized): Secondary | ICD-10-CM | POA: Diagnosis not present

## 2014-08-02 DIAGNOSIS — M6281 Muscle weakness (generalized): Secondary | ICD-10-CM | POA: Diagnosis not present

## 2014-08-02 DIAGNOSIS — S32402D Unspecified fracture of left acetabulum, subsequent encounter for fracture with routine healing: Secondary | ICD-10-CM | POA: Diagnosis not present

## 2014-08-02 DIAGNOSIS — M25652 Stiffness of left hip, not elsewhere classified: Secondary | ICD-10-CM | POA: Diagnosis not present

## 2014-08-04 DIAGNOSIS — M6281 Muscle weakness (generalized): Secondary | ICD-10-CM | POA: Diagnosis not present

## 2014-08-04 DIAGNOSIS — S32402D Unspecified fracture of left acetabulum, subsequent encounter for fracture with routine healing: Secondary | ICD-10-CM | POA: Diagnosis not present

## 2014-08-04 DIAGNOSIS — M25652 Stiffness of left hip, not elsewhere classified: Secondary | ICD-10-CM | POA: Diagnosis not present

## 2014-08-05 DIAGNOSIS — S32402D Unspecified fracture of left acetabulum, subsequent encounter for fracture with routine healing: Secondary | ICD-10-CM | POA: Diagnosis not present

## 2014-08-09 DIAGNOSIS — S32402D Unspecified fracture of left acetabulum, subsequent encounter for fracture with routine healing: Secondary | ICD-10-CM | POA: Diagnosis not present

## 2014-08-09 DIAGNOSIS — M6281 Muscle weakness (generalized): Secondary | ICD-10-CM | POA: Diagnosis not present

## 2014-08-09 DIAGNOSIS — M25652 Stiffness of left hip, not elsewhere classified: Secondary | ICD-10-CM | POA: Diagnosis not present

## 2014-08-11 DIAGNOSIS — S32402D Unspecified fracture of left acetabulum, subsequent encounter for fracture with routine healing: Secondary | ICD-10-CM | POA: Diagnosis not present

## 2014-08-11 DIAGNOSIS — M25652 Stiffness of left hip, not elsewhere classified: Secondary | ICD-10-CM | POA: Diagnosis not present

## 2014-08-11 DIAGNOSIS — M6281 Muscle weakness (generalized): Secondary | ICD-10-CM | POA: Diagnosis not present

## 2014-08-16 DIAGNOSIS — H6120 Impacted cerumen, unspecified ear: Secondary | ICD-10-CM | POA: Diagnosis not present

## 2014-08-17 DIAGNOSIS — F4323 Adjustment disorder with mixed anxiety and depressed mood: Secondary | ICD-10-CM | POA: Diagnosis not present

## 2014-08-18 DIAGNOSIS — M545 Low back pain: Secondary | ICD-10-CM | POA: Diagnosis not present

## 2014-08-18 DIAGNOSIS — M9903 Segmental and somatic dysfunction of lumbar region: Secondary | ICD-10-CM | POA: Diagnosis not present

## 2014-09-06 ENCOUNTER — Other Ambulatory Visit: Payer: Self-pay | Admitting: *Deleted

## 2014-09-06 MED ORDER — APIXABAN 5 MG PO TABS: 5.0000 mg | ORAL_TABLET | Freq: Two times a day (BID) | ORAL | Status: DC

## 2014-09-06 MED ORDER — METOPROLOL SUCCINATE ER 100 MG PO TB24: 100.0000 mg | ORAL_TABLET | Freq: Every day | ORAL | Status: DC

## 2014-09-15 DIAGNOSIS — M9903 Segmental and somatic dysfunction of lumbar region: Secondary | ICD-10-CM | POA: Diagnosis not present

## 2014-09-15 DIAGNOSIS — M545 Low back pain: Secondary | ICD-10-CM | POA: Diagnosis not present

## 2014-09-21 ENCOUNTER — Other Ambulatory Visit: Payer: Self-pay

## 2014-09-21 MED ORDER — APIXABAN 5 MG PO TABS: 5.0000 mg | ORAL_TABLET | Freq: Two times a day (BID) | ORAL | Status: DC

## 2014-09-21 MED ORDER — METOPROLOL SUCCINATE ER 100 MG PO TB24: 100.0000 mg | ORAL_TABLET | Freq: Every day | ORAL | Status: DC

## 2014-10-13 DIAGNOSIS — M9903 Segmental and somatic dysfunction of lumbar region: Secondary | ICD-10-CM | POA: Diagnosis not present

## 2014-10-13 DIAGNOSIS — M5386 Other specified dorsopathies, lumbar region: Secondary | ICD-10-CM | POA: Diagnosis not present

## 2014-10-20 ENCOUNTER — Ambulatory Visit (INDEPENDENT_AMBULATORY_CARE_PROVIDER_SITE_OTHER): Payer: Medicare Other

## 2014-10-20 DIAGNOSIS — L84 Corns and callosities: Secondary | ICD-10-CM

## 2014-10-21 NOTE — Progress Notes (Signed)
Patient ID: Dominic [name], male   DOB: 1934-04-04, 81 y.o.   MRN: [id number] Subjective: Patient complaining of painful callus on a plantar aspect of the right foot  Objective: Hemorrhagic hyperkeratoses plantar fourth right MPJ noted  Assessment: hyperkeratoses plantar right  Plan: The hyperkeratotic tissue was debrided back without any bleeding. Reappoint at patient's request

## 2014-10-24 ENCOUNTER — Encounter: Payer: Self-pay | Admitting: Cardiology

## 2014-10-31 ENCOUNTER — Encounter (INDEPENDENT_AMBULATORY_CARE_PROVIDER_SITE_OTHER): Payer: Medicare Other | Admitting: Ophthalmology

## 2014-10-31 DIAGNOSIS — H43813 Vitreous degeneration, bilateral: Secondary | ICD-10-CM

## 2014-10-31 DIAGNOSIS — I10 Essential (primary) hypertension: Secondary | ICD-10-CM | POA: Diagnosis not present

## 2014-10-31 DIAGNOSIS — H35033 Hypertensive retinopathy, bilateral: Secondary | ICD-10-CM | POA: Diagnosis not present

## 2014-10-31 DIAGNOSIS — H2513 Age-related nuclear cataract, bilateral: Secondary | ICD-10-CM | POA: Diagnosis not present

## 2014-10-31 DIAGNOSIS — H3531 Nonexudative age-related macular degeneration: Secondary | ICD-10-CM | POA: Diagnosis not present

## 2014-11-17 ENCOUNTER — Ambulatory Visit: Payer: Managed Care, Other (non HMO) | Admitting: Cardiology

## 2014-12-09 DIAGNOSIS — H401230 Low-tension glaucoma, bilateral, stage unspecified: Secondary | ICD-10-CM | POA: Diagnosis not present

## 2014-12-09 DIAGNOSIS — H409 Unspecified glaucoma: Secondary | ICD-10-CM | POA: Diagnosis not present

## 2014-12-09 DIAGNOSIS — H3532 Exudative age-related macular degeneration: Secondary | ICD-10-CM | POA: Diagnosis not present

## 2014-12-13 DIAGNOSIS — M5386 Other specified dorsopathies, lumbar region: Secondary | ICD-10-CM | POA: Diagnosis not present

## 2014-12-13 DIAGNOSIS — M9903 Segmental and somatic dysfunction of lumbar region: Secondary | ICD-10-CM | POA: Diagnosis not present

## 2014-12-14 ENCOUNTER — Ambulatory Visit (INDEPENDENT_AMBULATORY_CARE_PROVIDER_SITE_OTHER): Payer: Medicare Other | Admitting: Cardiology

## 2014-12-14 ENCOUNTER — Encounter: Payer: Self-pay | Admitting: Cardiology

## 2014-12-14 VITALS — BP 110/58 | HR 54 | Ht 72.0 in | Wt 206.4 lb

## 2014-12-14 DIAGNOSIS — I2510 Atherosclerotic heart disease of native coronary artery without angina pectoris: Secondary | ICD-10-CM

## 2014-12-14 DIAGNOSIS — I480 Paroxysmal atrial fibrillation: Secondary | ICD-10-CM | POA: Diagnosis not present

## 2014-12-14 DIAGNOSIS — G4733 Obstructive sleep apnea (adult) (pediatric): Secondary | ICD-10-CM

## 2014-12-14 DIAGNOSIS — I10 Essential (primary) hypertension: Secondary | ICD-10-CM | POA: Diagnosis not present

## 2014-12-14 DIAGNOSIS — I2583 Coronary atherosclerosis due to lipid rich plaque: Secondary | ICD-10-CM

## 2014-12-14 DIAGNOSIS — R609 Edema, unspecified: Secondary | ICD-10-CM

## 2014-12-14 DIAGNOSIS — R600 Localized edema: Secondary | ICD-10-CM | POA: Insufficient documentation

## 2014-12-14 DIAGNOSIS — E785 Hyperlipidemia, unspecified: Secondary | ICD-10-CM | POA: Diagnosis not present

## 2014-12-14 MED ORDER — METOPROLOL SUCCINATE ER 25 MG PO TB24: 75.0000 mg | ORAL_TABLET | Freq: Every day | ORAL | Status: DC

## 2014-12-14 MED ORDER — HYDROCHLOROTHIAZIDE 12.5 MG PO CAPS: 12.5000 mg | ORAL_CAPSULE | Freq: Every day | ORAL | Status: DC

## 2014-12-14 NOTE — Patient Instructions (Addendum)
Medication Instructions:  Your physician has recommended you make the following change in your medication:  1) DECREASE TOPROL to 75 mg  2) START HCTZ 12.5 mg daily   Labwork: IN ONE WEEK: FASTING BMET, LFTs, Lipids  Testing/Procedures: None  Follow-Up: You have been referred to [facility].  Your physician recommends that you schedule a follow-up appointment in: 1 month with an APP  Your physician wants you to follow-up in: 6 months with Dr. Radford Pax. You will receive a reminder letter in the mail two months in advance. If you don't receive a letter, please call our office to schedule the follow-up appointment.   Any Other Special Instructions Will Be Listed Below (If Applicable).

## 2014-12-14 NOTE — Progress Notes (Signed)
Cardiology Office Note   Date:  12/14/2014   ID:  Dominic [name], Keep 1933-09-19, MRN [id number]  PCP:   Dominic Crutch, MD    No chief complaint on file.     History of Present Illness:  Dominic [name] is a 81 y.o. male with a hx of CAD, s/p prior BMS, then Cypher DES to RCA in 08/2004. LHC in 05/2005 with significant RCA ISR which was treated with Taxus DES to RCA 05/2005, HTN, HL, OSA. He was admitted 6/6-6/7 with AFib with RVR. Patient had CP in the setting of AFib. CEs remained normal. He converted to NSR with rate control. Inpatient Myoview was low risk without ischemia. He was taken off of Plavix and placed on Eliquis for anticoagulation (CHADS2-VASc=4). F/u Echo as an OP demonstrated normal LVF. He returns for follow up. He is doing well. The patient denies chest pain, shortness of breath, syncope, orthopnea, PND.  He has been having some LE edema over the past 10 months.He denies any palpitations, dizziness or syncope.   He has a history of OSA and has been on CPAP but is not always compliant.   He does not use any table salt but does eat take out frequently     Past Medical History  Diagnosis Date  . Dyslipidemia     a. Intol of statins.  . Lung nodule     , right upper lobe  . Obstructive sleep apnea   . Prostate cancer   . Amputated finger     a. L index d/t to dog bite.  . Concussion   . Coronary artery disease 09/2004 and 04/2005    a. Stent to prox and mid RCA 08/2001. b. DES to RCA for ISR 08/2004. c. DES to [facility] for ISR 05/2005. d. Low risk nuc 10/2013 (done for CP in setting of new AF).  . Hypertension   . RBBB (right bundle branch block)   . Atrial fibrillation with RVR     a. New onset diagnosed 11/20/2013, spont converted to NSR.  Marland Kitchen OSA (obstructive sleep apnea)     Past Surgical History  Procedure Laterality Date  . Coronary stent placement    . Prostatectomy    . L knee ligament replacement    . Anterior cruciate ligament repair Right   .  Tonsillectomy    . Tooth extraction       Current Outpatient Prescriptions  Medication Sig Dispense Refill  . apixaban (ELIQUIS) 5 MG TABS tablet Take 1 tablet (5 mg total) by mouth 2 (two) times daily. 180 tablet 1  . aspirin EC 81 MG EC tablet Take 1 tablet (81 mg total) by mouth daily. 30 tablet 6  . metoprolol succinate (TOPROL-XL) 100 MG 24 hr tablet Take 1 tablet (100 mg total) by mouth daily. 90 tablet 1  . Multiple Vitamins-Minerals (PRESERVISION AREDS) CAPS Take 1 capsule by mouth 2 (two) times daily.    . nitroGLYCERIN (NITROSTAT) 0.4 MG SL tablet Place 1 tablet (0.4 mg total) under the tongue every 5 (five) minutes as needed for chest pain. 25 tablet 1  . NON FORMULARY tocotrienols are vitamin E supplement, Lutein-zeaxanthis, and piracetam    . Piracetam POWD 1 scoop by Does not apply route daily.    . Psyllium (METAMUCIL PO) Take 15 mLs by mouth 2 (two) times daily. Mix in water and drink    . Ubiquinol  100 MG CAPS Take 100 mg by mouth daily.     No current facility-administered medications for this visit.    Allergies:   Statins and Zetia    Social History:  The patient  reports that he has never smoked. He has never used smokeless tobacco. He reports that he does not drink alcohol or use illicit drugs.   Family History:  The patient's family history includes Cancer in his father; Emphysema in his mother.    ROS:  Please see the history of present illness.   Otherwise, review of systems are positive for none.   All other systems are reviewed and negative.    PHYSICAL EXAM: VS:  BP 110/58 mmHg  Pulse 54  Ht 6' (1.829 m)  Wt 206 lb 6.4 oz (93.622 kg)  BMI 27.99 kg/m2  SpO2 97% , BMI Body mass index is 27.99 kg/(m^2). GEN: Well nourished, well developed, in no acute distress HEENT: normal Neck: no JVD, carotid bruits, or masses Cardiac: RRR; no murmurs, rubs, or gallops,no edema  Respiratory:  clear to auscultation bilaterally, normal work of breathing GI: soft,  nontender, nondistended, + BS MS: no deformity or atrophy Skin: warm and dry, no rash Neuro:  Strength and sensation are intact Psych: euthymic mood, full affect   EKG:  EKG is not ordered today.    Recent Labs: 04/19/2014: ALT 20 04/22/2014: BUN 14; Creatinine, Ser 0.79; Potassium 4.6; Sodium 141 04/25/2014: Hemoglobin 10.7*; Platelets 201    Lipid Panel    Component Value Date/Time   CHOL 234* 11/21/2013 0400   TRIG 171* 11/21/2013 0400   HDL 34* 11/21/2013 0400   CHOLHDL 6.9 11/21/2013 0400   VLDL 34 11/21/2013 0400   LDLCALC 166* 11/21/2013 0400   LDLDIRECT 165.8 05/05/2013 0822      Wt Readings from Last 3 Encounters:  12/14/14 206 lb 6.4 oz (93.622 kg)  04/22/14 202 lb 2.6 oz (91.7 kg)  03/22/14 200 lb (90.719 kg)    ASSESSMENT AND PLAN:  1. Paroxysmal atrial fibrillation: Maintaining NSR. CHADS2-VASc=4. Would maintain Eliquis given significant stroke risk profile. Continue Toprol for suppression of PAF. 2. CAD s/p mulitple PCIs to RCA: No angina. Continue ASA. He is intolerant to statins. 3. Essential hypertension: Wellcontrolled. Continue BB 4. Hyperlipidemia: Intol of statins. Recheck FLP and ALT and Will refer to lipid clinic to consider PCSK9 evaluation 5. OSA on CPAP and tolerating well.   6.  LE edema which is trace on exam today.  I will start him on HCTZ 12.5mg  daily.  His BP is running on the low side of normal and his wife is very concerned about it.  She is convinced that his LE edema started after his dose of Toprol was increased to 100mg  daily a year ago.  I will decrease Toprol to 75mg  daily and see if LE edema improves with the diuretic.  Check BMET in 1 week   Current medicines are reviewed at length with the patient today.  The patient does not have concerns regarding medicines.  The following changes have been made:  no change  Labs/ tests ordered today: See above Assessment and Plan No orders of the defined types were placed in this  encounter.     Disposition:   FU with me in 6 months  Signed, Sueanne Margarita, MD  12/14/2014 3:41 PM    [facility] [place], [place],   [address] Phone: [phone]; Fax: [phone]

## 2014-12-21 ENCOUNTER — Other Ambulatory Visit: Payer: Private Health Insurance - Indemnity

## 2014-12-22 ENCOUNTER — Other Ambulatory Visit: Payer: Private Health Insurance - Indemnity

## 2014-12-22 ENCOUNTER — Ambulatory Visit: Payer: Private Health Insurance - Indemnity | Admitting: Pharmacist

## 2014-12-27 ENCOUNTER — Ambulatory Visit: Payer: Private Health Insurance - Indemnity | Admitting: Pharmacist

## 2014-12-27 ENCOUNTER — Other Ambulatory Visit: Payer: Private Health Insurance - Indemnity

## 2014-12-28 ENCOUNTER — Other Ambulatory Visit (INDEPENDENT_AMBULATORY_CARE_PROVIDER_SITE_OTHER): Payer: Medicare Other | Admitting: *Deleted

## 2014-12-28 DIAGNOSIS — I480 Paroxysmal atrial fibrillation: Secondary | ICD-10-CM | POA: Diagnosis not present

## 2014-12-28 DIAGNOSIS — I10 Essential (primary) hypertension: Secondary | ICD-10-CM

## 2014-12-28 DIAGNOSIS — E785 Hyperlipidemia, unspecified: Secondary | ICD-10-CM | POA: Diagnosis not present

## 2014-12-28 LAB — BASIC METABOLIC PANEL
BUN: 20 mg/dL (ref 6–23)
CO2: 28 mEq/L (ref 19–32)
CREATININE: 0.95 mg/dL (ref 0.40–1.50)
Calcium: 9.7 mg/dL (ref 8.4–10.5)
Chloride: 105 mEq/L (ref 96–112)
GFR: 81.32 mL/min (ref >60.00)
GLUCOSE: 81 mg/dL (ref 70–99)
Potassium: 3.6 mEq/L (ref 3.5–5.1)
SODIUM: 141 meq/L (ref 135–145)

## 2014-12-28 LAB — HEPATIC FUNCTION PANEL
ALT: 11 U/L (ref 0–53)
AST: 13 U/L (ref 0–37)
Albumin: 3.8 g/dL (ref 3.5–5.2)
Alkaline Phosphatase: 116 U/L (ref 39–117)
Bilirubin, Direct: 0.1 mg/dL (ref 0.0–0.3)
Total Bilirubin: 0.6 mg/dL (ref 0.2–1.2)
Total Protein: 7 g/dL (ref 6.0–8.3)

## 2014-12-28 LAB — LIPID PANEL
CHOLESTEROL: 220 mg/dL — AB (ref 0–200)
HDL: 33 mg/dL — ABNORMAL LOW (ref >39.00)
NONHDL: 187
Total CHOL/HDL Ratio: 7
Triglycerides: 225 mg/dL — ABNORMAL HIGH (ref 0.0–149.0)
VLDL: 45 mg/dL — ABNORMAL HIGH (ref 0.0–40.0)

## 2014-12-28 LAB — LDL CHOLESTEROL, DIRECT: Direct LDL: 146 mg/dL

## 2014-12-30 ENCOUNTER — Other Ambulatory Visit: Payer: Self-pay

## 2014-12-30 DIAGNOSIS — E785 Hyperlipidemia, unspecified: Secondary | ICD-10-CM

## 2015-01-02 ENCOUNTER — Ambulatory Visit: Payer: Medicare Other | Admitting: Pharmacist Clinician (PhC)/ Clinical Pharmacy Specialist

## 2015-01-02 DIAGNOSIS — H401210 Low-tension glaucoma, right eye, stage unspecified: Secondary | ICD-10-CM | POA: Diagnosis not present

## 2015-01-09 DIAGNOSIS — M5386 Other specified dorsopathies, lumbar region: Secondary | ICD-10-CM | POA: Diagnosis not present

## 2015-01-09 DIAGNOSIS — M9903 Segmental and somatic dysfunction of lumbar region: Secondary | ICD-10-CM | POA: Diagnosis not present

## 2015-01-16 DIAGNOSIS — H401230 Low-tension glaucoma, bilateral, stage unspecified: Secondary | ICD-10-CM | POA: Diagnosis not present

## 2015-01-22 NOTE — Progress Notes (Signed)
Cardiology Office Note   Date:  01/23/2015   ID:  Dominic, [name] [birth year], MRN [id number]  PCP:   Melinda Crutch, MD  Cardiologist:  Dr. Fransico Him   Electrophysiologist:  n/a  Chief Complaint  Patient presents with  . Coronary Artery Disease  . Atrial Fibrillation     History of Present Illness: Dominic [name] is a 81 y.o. male with a hx of CAD status post prior BMS to the RCA and subsequent Cypher DES to the RCA in 3/06. LHC in 12/06 demonstrated significant RCA ISR which was treated with a Taxus DES. Other history includes HTN, HL, sleep apnea, PAF. Admitted in 6/15 with AF with RVR. Inpatient Myoview was low risk. He was placed on Eliquis for anticoagulation secondary to significant risk factors for stroke. Last seen by Dr. Radford Pax 12/14/14.  HCTZ was added for edema and his Toprol was reduced from 100 >> 75 mg QD.    He is here with his wife today.  Doing well.  The patient denies chest pain, shortness of breath, syncope, orthopnea, PND.  No palpitations.  LE edema improved. He has mainly dependent edema.     Studies/Reports Reviewed Today:  Echo 12/13/13 Mod LVH, EF 55-60%, no RWMA, mild AI, mild LAE  Myoview 11/21/13 IMPRESSION: 1. Negative for inducible ischemia. 2. Small fixed defect in the apical anteroseptal extending to the true apex consistent with remote infarct/scarring. 3. No focal cardiac wall motion abnormality. 4. Calculated ejection fraction 56%. 5. Left ventricular dilatation.  LHC 05/2005 LM:  Ok LAD:mid 50-60%, ostial D2 60-70% LCx:  30% RCA:  Mid stent 99% ISR EF 65% PCI:  DES to mid-right coronary    Past Medical History  Diagnosis Date  . Dyslipidemia     a. Intol of statins.  . Lung nodule     , right upper lobe  . Obstructive sleep apnea   . Prostate cancer   . Amputated finger     a. L index d/t to dog bite.  . Concussion   . Coronary artery disease 09/2004 and 04/2005    a. Stent to prox and mid RCA 08/2001. b. DES to RCA for ISR  08/2004. c. DES to [facility] for ISR 05/2005. d. Low risk nuc 10/2013 (done for CP in setting of new AF).  . Hypertension   . RBBB (right bundle branch block)   . Atrial fibrillation with RVR     a. New onset diagnosed 11/20/2013, spont converted to NSR.  Marland Kitchen OSA (obstructive sleep apnea)     Past Surgical History  Procedure Laterality Date  . Coronary stent placement    . Prostatectomy    . L knee ligament replacement    . Anterior cruciate ligament repair Right   . Tonsillectomy    . Tooth extraction       Current Outpatient Prescriptions  Medication Sig Dispense Refill  . apixaban (ELIQUIS) 5 MG TABS tablet Take 1 tablet (5 mg total) by mouth 2 (two) times daily. 180 tablet 1  . aspirin EC 81 MG EC tablet Take 1 tablet (81 mg total) by mouth daily. 30 tablet 6  . hydrochlorothiazide (MICROZIDE) 12.5 MG capsule Take 1 capsule (12.5 mg total) by mouth daily. 30 capsule 6  . metoprolol succinate (TOPROL-XL) 25 MG 24 hr tablet Take 3 tablets (75 mg total) by mouth daily. 90 tablet 6  . Multiple Vitamins-Minerals (PRESERVISION AREDS) CAPS Take 1 capsule by mouth 2 (two) times daily.    Marland Kitchen  nitroGLYCERIN (NITROSTAT) 0.4 MG SL tablet Place 1 tablet (0.4 mg total) under the tongue every 5 (five) minutes as needed for chest pain. 25 tablet 1  . NON FORMULARY tocotrienols are vitamin E supplement, Lutein-zeaxanthis, and piracetam    . Piracetam POWD 1 scoop by Does not apply route daily.    . Psyllium (METAMUCIL PO) Take 15 mLs by mouth 2 (two) times daily. Mix in water and drink    . Ubiquinol 100 MG CAPS Take 100 mg by mouth daily.     No current facility-administered medications for this visit.    Allergies:   Statins and Zetia    Social History:  The patient  reports that he has never smoked. He has never used smokeless tobacco. He reports that he does not drink alcohol or use illicit drugs.   Family History:  The patient's family history includes Cancer in his father; Emphysema in his  mother.    ROS:   Please see the history of present illness.   Review of Systems  Musculoskeletal: Positive for joint swelling.  All other systems reviewed and are negative.     PHYSICAL EXAM: VS:  BP 124/60 mmHg  Pulse 59  Ht 6' (1.829 m)  Wt 205 lb 12.8 oz (93.35 kg)  BMI 27.91 kg/m2    Wt Readings from Last 3 Encounters:  01/23/15 205 lb 12.8 oz (93.35 kg)  12/14/14 206 lb 6.4 oz (93.622 kg)  04/22/14 202 lb 2.6 oz (91.7 kg)     GEN: Well nourished, well developed, in no acute distress HEENT: normal Neck: no JVD,   no masses Cardiac:  Normal S1/S2, RRR; no murmur ,  no rubs or gallops, trace bilat LE edema   Respiratory:  clear to auscultation bilaterally, no wheezing, rhonchi or rales. GI: soft, nontender, nondistended, + BS MS: no deformity or atrophy Skin: warm and dry  Neuro:  CNs II-XII intact, Strength and sensation are intact Psych: Normal affect   EKG:  EKG is ordered today.  It demonstrates:   Sinus bradycardia, HR 59, RBBB, no change from prior tracing   Recent Labs: 04/25/2014: Hemoglobin 10.7*; Platelets 201 12/28/2014: ALT 11; BUN 20; Creatinine, Ser 0.95; Potassium 3.6; Sodium 141    Lipid Panel    Component Value Date/Time   CHOL 220* 12/28/2014 1057   TRIG 225.0* 12/28/2014 1057   HDL 33.00* 12/28/2014 1057   CHOLHDL 7 12/28/2014 1057   VLDL 45.0* 12/28/2014 1057   LDLCALC 166* 11/21/2013 0400   LDLDIRECT 146.0 12/28/2014 1057      ASSESSMENT AND PLAN:  Essential hypertension:  Controlled.  Continue current Rx.   Coronary artery disease involving native coronary artery of native heart without angina pectoris:  No angina.  He is statin intol.  Continue ASA, beta-blocker.  PAF (paroxysmal atrial fibrillation):  Maintaining NSR. Continue Eliquis.  Recent Creatinine and Hgb ok.  Hyperlipidemia:  He has been referred to [facility] but does not meet criteria for PCSK-9 inhibitor.  Diet control has been recommended and he is to have FU  lipids in 3 mos.   Edema extremities:  I suspect his edema is all from venous insufficiency.  I have recommended keeping his legs elevated and to wear compression stockings. He can buy these OTC.  Obstructive sleep apnea:  Continue CPAP.     Medication Changes: Current medicines are reviewed at length with the patient today.  Concerns regarding medicines are as outlined above.  The following changes have been made:  Discontinued Medications   No medications on file   Modified Medications   No medications on file   New Prescriptions   No medications on file    Labs/ tests ordered today include:   No orders of the defined types were placed in this encounter.     Disposition:   FU with Dr. Fransico Him as planned.    Signed, Versie Starks, MHS 01/23/2015 4:45 PM    [facility] [place], [place], [place]  [address] Phone: [phone]; Fax: [phone]

## 2015-01-23 ENCOUNTER — Encounter: Payer: Self-pay | Admitting: Physician Assistant

## 2015-01-23 ENCOUNTER — Ambulatory Visit (INDEPENDENT_AMBULATORY_CARE_PROVIDER_SITE_OTHER): Payer: Medicare Other | Admitting: Physician Assistant

## 2015-01-23 VITALS — BP 124/60 | HR 59 | Ht 72.0 in | Wt 205.8 lb

## 2015-01-23 DIAGNOSIS — E785 Hyperlipidemia, unspecified: Secondary | ICD-10-CM | POA: Diagnosis not present

## 2015-01-23 DIAGNOSIS — R600 Localized edema: Secondary | ICD-10-CM

## 2015-01-23 DIAGNOSIS — I480 Paroxysmal atrial fibrillation: Secondary | ICD-10-CM | POA: Diagnosis not present

## 2015-01-23 DIAGNOSIS — I2510 Atherosclerotic heart disease of native coronary artery without angina pectoris: Secondary | ICD-10-CM

## 2015-01-23 DIAGNOSIS — G4733 Obstructive sleep apnea (adult) (pediatric): Secondary | ICD-10-CM

## 2015-01-23 DIAGNOSIS — I10 Essential (primary) hypertension: Secondary | ICD-10-CM | POA: Diagnosis not present

## 2015-01-23 DIAGNOSIS — R609 Edema, unspecified: Secondary | ICD-10-CM

## 2015-01-23 NOTE — Patient Instructions (Signed)
Medication Instructions:  Same-no change  Labwork: None  Testing/Procedures: None  Follow-Up: Your physician recommends that you schedule a follow-up appointment in: as previously planned.   Any Other Special Instructions Will Be Listed Below (If Applicable). Your provider recommends that you purchase over the counter knee high compression stockings

## 2015-02-21 ENCOUNTER — Emergency Department (HOSPITAL_BASED_OUTPATIENT_CLINIC_OR_DEPARTMENT_OTHER)
Admission: EM | Admit: 2015-02-21 | Discharge: 2015-02-21 | Disposition: A | Discharge status: Home / Self Care | Payer: Medicare Other | Attending: Emergency Medicine | Admitting: Emergency Medicine

## 2015-02-21 ENCOUNTER — Encounter (HOSPITAL_BASED_OUTPATIENT_CLINIC_OR_DEPARTMENT_OTHER): Payer: Self-pay | Admitting: *Deleted

## 2015-02-21 ENCOUNTER — Emergency Department (HOSPITAL_BASED_OUTPATIENT_CLINIC_OR_DEPARTMENT_OTHER): Payer: Medicare Other

## 2015-02-21 ENCOUNTER — Telehealth: Payer: Self-pay | Admitting: Physician Assistant

## 2015-02-21 DIAGNOSIS — Z87828 Personal history of other (healed) physical injury and trauma: Secondary | ICD-10-CM | POA: Insufficient documentation

## 2015-02-21 DIAGNOSIS — Z7982 Long term (current) use of aspirin: Secondary | ICD-10-CM | POA: Diagnosis not present

## 2015-02-21 DIAGNOSIS — I10 Essential (primary) hypertension: Secondary | ICD-10-CM | POA: Insufficient documentation

## 2015-02-21 DIAGNOSIS — R42 Dizziness and giddiness: Secondary | ICD-10-CM | POA: Insufficient documentation

## 2015-02-21 DIAGNOSIS — Z8546 Personal history of malignant neoplasm of prostate: Secondary | ICD-10-CM | POA: Diagnosis not present

## 2015-02-21 DIAGNOSIS — I4891 Unspecified atrial fibrillation: Secondary | ICD-10-CM

## 2015-02-21 DIAGNOSIS — J984 Other disorders of lung: Secondary | ICD-10-CM | POA: Diagnosis not present

## 2015-02-21 DIAGNOSIS — I2510 Atherosclerotic heart disease of native coronary artery without angina pectoris: Secondary | ICD-10-CM | POA: Insufficient documentation

## 2015-02-21 DIAGNOSIS — Z9861 Coronary angioplasty status: Secondary | ICD-10-CM | POA: Diagnosis not present

## 2015-02-21 DIAGNOSIS — I499 Cardiac arrhythmia, unspecified: Secondary | ICD-10-CM | POA: Insufficient documentation

## 2015-02-21 DIAGNOSIS — Z79899 Other long term (current) drug therapy: Secondary | ICD-10-CM | POA: Diagnosis not present

## 2015-02-21 DIAGNOSIS — R55 Syncope and collapse: Secondary | ICD-10-CM | POA: Insufficient documentation

## 2015-02-21 DIAGNOSIS — I480 Paroxysmal atrial fibrillation: Secondary | ICD-10-CM | POA: Diagnosis not present

## 2015-02-21 DIAGNOSIS — Z8669 Personal history of other diseases of the nervous system and sense organs: Secondary | ICD-10-CM | POA: Insufficient documentation

## 2015-02-21 DIAGNOSIS — Z8639 Personal history of other endocrine, nutritional and metabolic disease: Secondary | ICD-10-CM | POA: Diagnosis not present

## 2015-02-21 DIAGNOSIS — G910 Communicating hydrocephalus: Secondary | ICD-10-CM | POA: Diagnosis not present

## 2015-02-21 DIAGNOSIS — R079 Chest pain, unspecified: Secondary | ICD-10-CM

## 2015-02-21 LAB — COMPREHENSIVE METABOLIC PANEL
ALBUMIN: 3.6 g/dL (ref 3.5–5.0)
ALT: 16 U/L — ABNORMAL LOW (ref 17–63)
ANION GAP: 10 (ref 5–15)
AST: 22 U/L (ref 15–41)
Alkaline Phosphatase: 104 U/L (ref 38–126)
BILIRUBIN TOTAL: 0.6 mg/dL (ref 0.3–1.2)
BUN: 18 mg/dL (ref 6–20)
CALCIUM: 9.9 mg/dL (ref 8.9–10.3)
CO2: 25 mmol/L (ref 22–32)
Chloride: 107 mmol/L (ref 101–111)
Creatinine, Ser: 1.04 mg/dL (ref 0.61–1.24)
GFR calc non Af Amer: >60 mL/min (ref >60)
GLUCOSE: 119 mg/dL — AB (ref 65–99)
Potassium: 3.2 mmol/L — ABNORMAL LOW (ref 3.5–5.1)
SODIUM: 142 mmol/L (ref 135–145)
TOTAL PROTEIN: 7.2 g/dL (ref 6.5–8.1)

## 2015-02-21 LAB — CBC
HCT: 49.1 % (ref 39.0–52.0)
Hemoglobin: 16.9 g/dL (ref 13.0–17.0)
MCH: 28.6 pg (ref 26.0–34.0)
MCHC: 34.4 g/dL (ref 30.0–36.0)
MCV: 83.2 fL (ref 78.0–100.0)
Platelets: 188 10*3/uL (ref 150–400)
RBC: 5.9 MIL/uL — ABNORMAL HIGH (ref 4.22–5.81)
RDW: 14.3 % (ref 11.5–15.5)
WBC: 7.6 10*3/uL (ref 4.0–10.5)

## 2015-02-21 LAB — APTT: APTT: 31 s (ref 24–37)

## 2015-02-21 LAB — MAGNESIUM: MAGNESIUM: 2.1 mg/dL (ref 1.7–2.4)

## 2015-02-21 LAB — PROTIME-INR
INR: 1.15 (ref 0.00–1.49)
Prothrombin Time: 14.9 seconds (ref 11.6–15.2)

## 2015-02-21 LAB — TROPONIN I

## 2015-02-21 LAB — TSH: TSH: 1.916 u[IU]/mL (ref 0.350–4.500)

## 2015-02-21 LAB — T4, FREE: Free T4: 0.87 ng/dL (ref 0.61–1.12)

## 2015-02-21 MED ORDER — DILTIAZEM LOAD VIA INFUSION
15.0000 mg | Freq: Once | INTRAVENOUS | Status: AC
  Administered 2015-02-21: 15 mg via INTRAVENOUS
  Filled 2015-02-21: qty 15

## 2015-02-21 MED ORDER — DEXTROSE 5 % IV SOLN
5.0000 mg/h | INTRAVENOUS | Status: DC
  Administered 2015-02-21: 5 mg/h via INTRAVENOUS
  Filled 2015-02-21: qty 100

## 2015-02-21 MED ORDER — ATROPINE SULFATE 0.1 MG/ML IJ SOLN
INTRAMUSCULAR | Status: AC
  Administered 2015-02-21: 1 mg
  Filled 2015-02-21: qty 10

## 2015-02-21 MED ORDER — POTASSIUM CHLORIDE CRYS ER 20 MEQ PO TBCR
40.0000 meq | EXTENDED_RELEASE_TABLET | Freq: Once | ORAL | Status: AC
  Administered 2015-02-21: 40 meq via ORAL
  Filled 2015-02-21: qty 2

## 2015-02-21 MED ORDER — FENTANYL CITRATE (PF) 100 MCG/2ML IJ SOLN
50.0000 ug | Freq: Once | INTRAMUSCULAR | Status: AC
  Administered 2015-02-21: 50 ug via INTRAVENOUS
  Filled 2015-02-21: qty 2

## 2015-02-21 MED ORDER — SODIUM CHLORIDE 0.9 % IV SOLN
1.0000 g | Freq: Once | INTRAVENOUS | Status: AC
  Administered 2015-02-21: 1 g via INTRAVENOUS
  Filled 2015-02-21: qty 10

## 2015-02-21 MED ORDER — ATROPINE SULFATE 1 MG/ML IJ SOLN: 1.0000 mg | Freq: Once | INTRAMUSCULAR | Status: DC

## 2015-02-21 MED ORDER — SODIUM CHLORIDE 0.9 % IV SOLN
20.0000 mL | INTRAVENOUS | Status: DC
  Administered 2015-02-21: 20 mL via INTRAVENOUS

## 2015-02-21 MED ORDER — ETOMIDATE 2 MG/ML IV SOLN
10.0000 mg | Freq: Once | INTRAVENOUS | Status: AC
  Administered 2015-02-21: 10 mg via INTRAVENOUS
  Filled 2015-02-21: qty 10

## 2015-02-21 MED ORDER — ATROPINE SULFATE 1 MG/ML IJ SOLN
INTRAMUSCULAR | Status: AC
  Filled 2015-02-21: qty 1

## 2015-02-21 MED ORDER — ATROPINE SULFATE 0.1 MG/ML IJ SOLN
INTRAMUSCULAR | Status: AC
  Administered 2015-02-21: 0.5 mg
  Filled 2015-02-21: qty 10

## 2015-02-21 NOTE — ED Notes (Signed)
Cont on cardiac monitor, vs q15 min, wife at side, room door remains open

## 2015-02-21 NOTE — Sedation Documentation (Signed)
12 lead EKG done after cardioversion

## 2015-02-21 NOTE — ED Notes (Signed)
Cardizem Bolus completed, gtt at 92ml/hr (5mg /hr)

## 2015-02-21 NOTE — ED Notes (Signed)
FAST assessment: Negative 

## 2015-02-21 NOTE — ED Notes (Signed)
12 lead EKG repeated with HR at 62/min, NSR

## 2015-02-21 NOTE — Sedation Documentation (Signed)
EDP at bedside secondary to changes in NBP

## 2015-02-21 NOTE — Sedation Documentation (Signed)
Family updated as to patient's status.

## 2015-02-21 NOTE — ED Notes (Signed)
A-fib noted on monitor, rate <100/min, repeat 12 lead EKG done and given to EDP for evaluation

## 2015-02-21 NOTE — ED Notes (Signed)
Resting quietly w/ wife at side, denies any pain

## 2015-02-21 NOTE — Sedation Documentation (Signed)
Pt unresponsive, shocked with 100 jueles per dr. Edman Circle order.

## 2015-02-21 NOTE — ED Provider Notes (Signed)
CSN: [id number]     Arrival date & time 02/21/15  0715 History   None    Chief Complaint  Patient presents with  . Dizziness     (Consider location/radiation/quality/duration/timing/severity/associated sxs/prior Treatment) HPI Comments: 81 year old male with history of coronary artery disease, hypertension, hyperlipidemia, paroxysmal atrial fibrillation on Eliquis presents with concern of episode of near syncope with fall.  Reports he was getting ready for work when he started to feel dizzy, like he was going to pass out, and fell to floor. Denies any syncope. Denies any chest pain, shortness of breath, numbness, weakness, facial asymmetry, difficulty speaking, visual changes, black or bloody stool, recent infections.  Reports he decrease his metoprolol approximately 6 or 7 weeks ago and was started on Lasix at cardiologist.  Patient is a 81 y.o. male presenting with near-syncope.  Near Syncope This is a new problem. The current episode started 1 to 2 hours ago. The problem occurs constantly. The problem has been gradually improving. Pertinent negatives include no chest pain, no abdominal pain, no headaches and no shortness of breath. Nothing aggravates the symptoms. Nothing relieves the symptoms. He has tried nothing for the symptoms. The treatment provided no relief.    Past Medical History  Diagnosis Date  . Dyslipidemia     a. Intol of statins.  . Lung nodule     , right upper lobe  . Obstructive sleep apnea   . Prostate cancer   . Amputated finger     a. L index d/t to dog bite.  . Concussion   . Coronary artery disease 09/2004 and 04/2005    a. Stent to prox and mid RCA 08/2001. b. DES to RCA for ISR 08/2004. c. DES to [facility] for ISR 05/2005. d. Low risk nuc 10/2013 (done for CP in setting of new AF).  . Hypertension   . RBBB (right bundle branch block)   . Atrial fibrillation with RVR     a. New onset diagnosed 11/20/2013, spont converted to NSR.  Marland Kitchen OSA (obstructive sleep apnea)     Past Surgical History  Procedure Laterality Date  . Coronary stent placement    . Prostatectomy    . L knee ligament replacement    . Anterior cruciate ligament repair Right   . Tonsillectomy    . Tooth extraction     Family History  Problem Relation Age of Onset  . Emphysema Mother   . Cancer Father    Social History  Substance Use Topics  . Smoking status: Never Smoker   . Smokeless tobacco: Never Used  . Alcohol Use: No    Review of Systems  Constitutional: Negative for fever.  HENT: Negative for sore throat.   Eyes: Negative for visual disturbance.  Respiratory: Negative for shortness of breath.   Cardiovascular: Positive for near-syncope. Negative for chest pain.  Gastrointestinal: Negative for nausea, vomiting, abdominal pain, diarrhea, constipation and blood in stool.  Genitourinary: Negative for difficulty urinating.  Musculoskeletal: Negative for back pain and neck stiffness.  Skin: Negative for rash.  Neurological: Positive for light-headedness. Negative for syncope and headaches.      Allergies  Statins and Zetia  Home Medications   Prior to Admission medications   Medication Sig Start Date End Date Taking? Authorizing Provider  apixaban (ELIQUIS) 5 MG TABS tablet Take 1 tablet (5 mg total) by mouth 2 (two) times daily. 09/21/14   Sueanne Margarita, MD  aspirin EC 81 MG EC tablet Take 1 tablet (81 mg total)  by mouth daily. 11/21/13   Dayna N Dunn, PA-C  hydrochlorothiazide (MICROZIDE) 12.5 MG capsule Take 1 capsule (12.5 mg total) by mouth daily. 12/14/14   Sueanne Margarita, MD  metoprolol succinate (TOPROL-XL) 25 MG 24 hr tablet Take 3 tablets (75 mg total) by mouth daily. 12/14/14   Sueanne Margarita, MD  Multiple Vitamins-Minerals (PRESERVISION AREDS) CAPS Take 1 capsule by mouth 2 (two) times daily.    Historical Provider, MD  nitroGLYCERIN (NITROSTAT) 0.4 MG SL tablet Place 1 tablet (0.4 mg total) under the tongue every 5 (five) minutes as needed for chest pain.  11/22/13   Sueanne Margarita, MD  NON FORMULARY tocotrienols are vitamin E supplement, Lutein-zeaxanthis, and piracetam    Historical Provider, MD  Piracetam POWD 1 scoop by Does not apply route daily.    Historical Provider, MD  Psyllium (METAMUCIL PO) Take 15 mLs by mouth 2 (two) times daily. Mix in water and drink    Historical Provider, MD  Ubiquinol 100 MG CAPS Take 100 mg by mouth daily.    Historical Provider, MD   BP 105/48 mmHg  Pulse 52  Temp(Src) 98.1 F (36.7 C) (Oral)  Resp 13  Ht 6' (1.829 m)  Wt 205 lb (92.987 kg)  BMI 27.80 kg/m2  SpO2 98% Physical Exam  Constitutional: He is oriented to person, place, and time. He appears well-developed and well-nourished. No distress.  HENT:  Head: Normocephalic and atraumatic.  Eyes: Conjunctivae and EOM are normal.  Neck: Normal range of motion.  Cardiovascular: Normal heart sounds and intact distal pulses.  An irregularly irregular rhythm present. Tachycardia present.  Exam reveals no gallop and no friction rub.   No murmur heard. Pulmonary/Chest: Effort normal and breath sounds normal. No respiratory distress. He has no wheezes. He has no rales.  Abdominal: Soft. He exhibits no distension. There is no tenderness. There is no guarding.  Musculoskeletal: He exhibits no edema.  Neurological: He is alert and oriented to person, place, and time.  Skin: Skin is warm and dry. He is not diaphoretic.  Nursing note and vitals reviewed.   ED Course  Procedures (including critical care time) Labs Review Labs Reviewed  CBC - Abnormal; Notable for the following:    RBC 5.90 (*)    All other components within normal limits  COMPREHENSIVE METABOLIC PANEL - Abnormal; Notable for the following:    Potassium 3.2 (*)    Glucose, Bld 119 (*)    ALT 16 (*)    All other components within normal limits  URINE CULTURE  APTT  PROTIME-INR  TROPONIN I  MAGNESIUM  TSH  T4, FREE    Imaging Review Ct Head Wo Contrast  02/21/2015   CLINICAL  DATA:  Dizziness following fall. Patient receiving blood thinner medication. Atrial fibrillation.  EXAM: CT HEAD WITHOUT CONTRAST  TECHNIQUE: Contiguous axial images were obtained from the base of the skull through the vertex without intravenous contrast.  COMPARISON:  April 19, 2014  FINDINGS: There is moderate generalized atrophy with the ventricles appearing slightly larger in proportion than the sulci. There is no intracranial mass, hemorrhage, extra-axial fluid collection, or midline shift. There is slight small vessel disease in the centra semiovale bilaterally. Elsewhere gray-white compartments appear normal. No acute infarct evident. Bony calvarium appears intact. The mastoid air cells are clear.  IMPRESSION: Atrophy with a possible degree of concomitant communicating hydrocephalus. There is slight periventricular small vessel disease. No intracranial mass, hemorrhage, or acute appearing infarct. No extra-axial fluid collections  are appreciable on this study.   Electronically Signed   By: Lowella Grip III M.D.   On: 02/21/2015 08:59   Dg Chest Port 1 View  02/21/2015   CLINICAL DATA:  Dizziness and atrial fibrillation  EXAM: PORTABLE CHEST - 1 VIEW  COMPARISON:  April 19, 2014  FINDINGS: There is stable scarring in the right lower lobe region. There is no edema or consolidation. Heart size and pulmonary vascularity are normal. No adenopathy. No bone lesions. There is a stent in the right coronary artery region.  IMPRESSION: No edema or consolidation. Stable scarring on the right. No change in cardiac silhouette.   Electronically Signed   By: Lowella Grip III M.D.   On: 02/21/2015 08:07   I have personally reviewed and evaluated these images and lab results as part of my medical decision-making.   EKG Interpretation   Date/Time:  Tuesday February 21 2015 07:50:19 EDT Ventricular Rate:  130 PR Interval:    QRS Duration: 142 QT Interval:  298 QTC Calculation: 438 R Axis:    107 Text Interpretation:  Atrial fibrillation with rapid ventricular response  Right bundle branch block Abnormal ECG Similar to June 6th 2015 Confirmed  by [facility] MD, Sherlene Rickel ([id number]) on 02/21/2015 10:09:05 PM      MDM   Final diagnoses:  Atrial fibrillation with RVR  Communicating hydrocephalus, possible  Lightheadedness   81 year old male with history of coronary artery disease, hypertension, hyperlipidemia, paroxysmal atrial fibrillation on Eliquis presents with concern of episode of near syncope with fall.  Initial EKG's unclear whether was a sinus tachycardia with possible P waves, versus SVT, however did show bifascicular block regular tachycardia, however shortly after evaluation of the rhythm became more irregular feel this is most likely atrial fibrillation with RVR.  Patient denies any other strokelike symptoms, and has a normal neurologic exam at this time and have low suspicion for CVA or TIA. A CT head was ordered given patient with fall on Eliquis which showed no sign of intracranial hemorrhage, and possible communicating hydrocephalus. CBC showed normal hemoglobin. CMP showed mild hypokalemia, magnesium WNL.  Troponin was negative and pt without CP/SOB to suggest MI/PE.  Atrial fibrillation with RVR is most likely cause of patient's symptoms.  Patient was given a 15 mg bolus of delta and started on an infusion with continuing afib with rate in the 90s.  Called Cardiology and spoke to Dr. Haroldine Laws who recommended discontinuation of the cardizem drip and cardioversion.  Pt reports taking eliquis without missing doses.  Procedural sedation performed and pt cardioverted using 100J synchronized cardioversion with immediate cardioversion to sinus rhythm, however pt with sinus bradycardia following the procedure.  Pt maintained MAPs despite bradycardia, and mental status following procedure returned to normal.  He was given a total of 1.5mg  of atropine following the procedure and 1g of calcium  gluconate.  HR returned to the 50s-60s and he was observed for several hours without return of significant bradycardia or afib and remained asymptomatic.   On review of clinic notes, pt HR chronically in 85s.  Pt to follow up closely with cardiology. Patient discharged in stable condition with understanding of reasons to return.   CRITICAL CARE:Atrial fibrillation with RVR requiring dilt gtt, cardioversion and bradycardia Performed by: Alvino Chapel   Total critical care time: 76min  Critical care time was exclusive of separately billable procedures and treating other patients.  Critical care was necessary to treat or prevent imminent or life-threatening deterioration.  Critical care was  time spent personally by me on the following activities: development of treatment plan with patient and/or surrogate as well as nursing, discussions with consultants, evaluation of patient's response to treatment, examination of patient, obtaining history from patient or surrogate, ordering and performing treatments and interventions, ordering and review of laboratory studies, ordering and review of radiographic studies, pulse oximetry and re-evaluation of patient's condition.     Gareth Morgan, MD 02/21/15 2214

## 2015-02-21 NOTE — ED Notes (Signed)
Placed on 2lpm via [place], pox at 94% on RA

## 2015-02-21 NOTE — Telephone Encounter (Signed)
Patient woke up with a  cold, clamy and lightheaded this morning. After getting ready for work, he fell. No LOC or hit his head. Wife checked his pulse noted to be in 132. No chest pain, sob, palpitation, orthopnea or PND. Patient did have a hx of PAF. He took his Toprol about hour ago and rate is still high. Suspect patient back in AF. Adviced patient to go to nearest ER for further evaluation.   Chemere Steffler, [place]

## 2015-02-21 NOTE — Sedation Documentation (Signed)
Opens eyes to light verbal stimuli, follows commands, oriented, resting quietly and comfortably

## 2015-02-21 NOTE — ED Notes (Signed)
Pt High Fall Risk

## 2015-02-21 NOTE — ED Notes (Signed)
12 Lead EKG repeated secondary to change in rhythm, to EDP - Afib w/ RVR

## 2015-02-21 NOTE — ED Notes (Signed)
Placed on cont cardiac monitoring

## 2015-02-21 NOTE — ED Notes (Signed)
12 lead EKG done 

## 2015-02-21 NOTE — ED Notes (Signed)
Cardizem gtt increased to 10mg /hr per EDP orders

## 2015-02-21 NOTE — ED Notes (Signed)
Procedural permit obtained for cardioversion, both wife and pt understand procedure being planned, opportunity for questions provided, EDP in to explain all risk and benefits of cardioversion

## 2015-02-21 NOTE — ED Notes (Signed)
Ok for pt to go for CT scan off monitor per EDP

## 2015-02-21 NOTE — ED Notes (Signed)
Patient transported to CT 

## 2015-02-21 NOTE — ED Notes (Signed)
States fell at home this am, feeling dizzy

## 2015-02-21 NOTE — ED Notes (Signed)
MD at bedside. 

## 2015-02-21 NOTE — Discharge Instructions (Signed)
Atrial Fibrillation  Atrial fibrillation is a condition that causes your heart to beat irregularly. It may also cause your heart to beat faster than normal. Atrial fibrillation can prevent your heart from pumping blood normally. It increases your risk of stroke and heart problems.  HOME CARE  · Take medications as told by your doctor.  · Only take medications that your doctor says are safe. Some medications can make the condition worse or happen again.  · If blood thinners were prescribed by your doctor, take them exactly as told. Too much can cause bleeding. Too little and you will not have the needed protection against stroke and other problems.  · Perform blood tests at home if told by your doctor.  · Perform blood tests exactly as told by your doctor.  · Do not drink alcohol.  · Do not drink beverages with caffeine such as coffee, soda, and some teas.  · Maintain a healthy weight.  · Do not use diet pills unless your doctor says they are safe. They may make heart problems worse.  · Follow diet instructions as told by your doctor.  · Exercise regularly as told by your doctor.  · Keep all follow-up appointments.  GET HELP IF:  · You notice a change in the speed, rhythm, or strength of your heartbeat.  · You suddenly begin peeing (urinating) more often.  · You get tired more easily when moving or exercising.  GET HELP RIGHT AWAY IF:   · You have chest or belly (abdominal) pain.  · You feel sick to your stomach (nauseous).  · You are short of breath.  · You suddenly have swollen feet and ankles.  · You feel dizzy.  · You face, arms, or legs feel numb or weak.  · There is a change in your vision or speech.  MAKE SURE YOU:   · Understand these instructions.  · Will watch your condition.  · Will get help right away if you are not doing well or get worse.  Document Released: 03/12/2008 Document Revised: 10/18/2013 Document Reviewed: 07/14/2012  Exit[employer]® Patient Information ©2015 [employer]. This information is not  intended to replace advice given to you by your health care provider. Make sure you discuss any questions you have with your health care provider.

## 2015-02-21 NOTE — ED Notes (Signed)
EDP at bedside to reevaluate pt and discuss plan of care.

## 2015-02-21 NOTE — ED Notes (Signed)
Cardizem Bolus and gtt on infusion pump

## 2015-02-21 NOTE — ED Notes (Signed)
PCXR done per EDP orders

## 2015-02-22 ENCOUNTER — Encounter: Payer: Self-pay | Admitting: Cardiology

## 2015-02-22 ENCOUNTER — Ambulatory Visit (INDEPENDENT_AMBULATORY_CARE_PROVIDER_SITE_OTHER): Payer: Medicare Other | Admitting: Cardiology

## 2015-02-22 VITALS — BP 118/54 | HR 74 | Ht 72.0 in | Wt 207.4 lb

## 2015-02-22 DIAGNOSIS — I4510 Unspecified right bundle-branch block: Secondary | ICD-10-CM | POA: Diagnosis not present

## 2015-02-22 DIAGNOSIS — R600 Localized edema: Secondary | ICD-10-CM

## 2015-02-22 DIAGNOSIS — E876 Hypokalemia: Secondary | ICD-10-CM | POA: Diagnosis not present

## 2015-02-22 DIAGNOSIS — I2510 Atherosclerotic heart disease of native coronary artery without angina pectoris: Secondary | ICD-10-CM

## 2015-02-22 DIAGNOSIS — R609 Edema, unspecified: Secondary | ICD-10-CM

## 2015-02-22 DIAGNOSIS — I2583 Coronary atherosclerosis due to lipid rich plaque: Secondary | ICD-10-CM

## 2015-02-22 DIAGNOSIS — I480 Paroxysmal atrial fibrillation: Secondary | ICD-10-CM

## 2015-02-22 NOTE — Patient Instructions (Signed)
Medication Instructions:  DECREASE YOUR HYDROCHLOROTHIAZIDE TO 12.5 MG ON Monday, Wednesday, AND Friday ONLY    Labwork: NONE  Testing/Procedures: NONE  Follow-Up: Your physician recommends that you schedule a follow-up appointment in: 3 MONTH OV/EKG/BMET  Any Other Special Instructions Will Be Listed Below (If Applicable).  OK FOR YOU TO RETURN TO WORK

## 2015-02-22 NOTE — Progress Notes (Signed)
Cardiology Office Note   Date:  02/22/2015   ID:  Dominic [name], Dominic [name] 13-Dec-1933, MRN [id number]  PCP:   Melinda Crutch, MD  Cardiologist: Darlin Coco MD  No chief complaint on file.     History of Present Illness: Dominic [name] is a 81 y.o. male who presents for follow-up of dizzy spell.  The patient was seen in the emergency room yesterday after experiencing dizziness and syncope at home.  He was found to be in paroxysmal atrial fibrillation.  He has had known episodes of paroxysmal atrial fibrillation since June 2015.  He has been on long-term anticoagulation.  Yesterday morning he was up and about at home and felt dizzy and then had brief syncope.  He went to the emergency room where he was found to be in atrial fibrillation with rapid ventricular response.  He states that he underwent electrical cardioversion in the emergency room and was sent home.  He feels well.  He is maintaining normal sinus rhythm.  In the emergency room his electrolytes were abnormal with potassium of 3.2 and he was given potassium supplementation. The patient has a history of known coronary disease.  He has had prior stents.a hx of CAD status post prior BMS to the RCA and subsequent Cypher DES to the RCA in 3/06. LHC in 12/06 demonstrated significant RCA ISR which was treated with a Taxus DES. Other history includes HTN, HL, sleep apnea, PAF. Admitted in 6/15 with AF with RVR. Inpatient Myoview was low risk.  The patient has not had any recurrent chest pain or angina.  He has had previous peripheral edema which has improved considerably since he started using support hose.   Past Medical History  Diagnosis Date  . Dyslipidemia     a. Intol of statins.  . Lung nodule     , right upper lobe  . Obstructive sleep apnea   . Prostate cancer   . Amputated finger     a. L index d/t to dog bite.  . Concussion   . Coronary artery disease 09/2004 and 04/2005    a. Stent to prox and mid RCA 08/2001. b. DES to RCA for  ISR 08/2004. c. DES to [facility] for ISR 05/2005. d. Low risk nuc 10/2013 (done for CP in setting of new AF).  . Hypertension   . RBBB (right bundle branch block)   . Atrial fibrillation with RVR     a. New onset diagnosed 11/20/2013, spont converted to NSR.  Marland Kitchen OSA (obstructive sleep apnea)     Past Surgical History  Procedure Laterality Date  . Coronary stent placement    . Prostatectomy    . L knee ligament replacement    . Anterior cruciate ligament repair Right   . Tonsillectomy    . Tooth extraction       Current Outpatient Prescriptions  Medication Sig Dispense Refill  . apixaban (ELIQUIS) 5 MG TABS tablet Take 1 tablet (5 mg total) by mouth 2 (two) times daily. 180 tablet 1  . aspirin EC 81 MG EC tablet Take 1 tablet (81 mg total) by mouth daily. 30 tablet 6  . hydrochlorothiazide (MICROZIDE) 12.5 MG capsule Take 12.5 mg by mouth as directed. ONE TABLET BY MOUTH ON Monday, Wednesday, AND Friday    . metoprolol succinate (TOPROL-XL) 25 MG 24 hr tablet Take 3 tablets (75 mg total) by mouth daily. 90 tablet 6  . Multiple Vitamins-Minerals (PRESERVISION AREDS) CAPS Take 1 capsule by mouth 2 (  two) times daily.    . nitroGLYCERIN (NITROSTAT) 0.4 MG SL tablet Place 1 tablet (0.4 mg total) under the tongue every 5 (five) minutes as needed for chest pain. 25 tablet 1  . NON FORMULARY tocotrienols are vitamin E supplement, Lutein-zeaxanthis, and piracetam    . Piracetam POWD Take 1 scoop by mouth daily.     . Psyllium (METAMUCIL PO) Take 15 mLs by mouth 2 (two) times daily. Mix in water and drink    . Ubiquinol 100 MG CAPS Take 100 mg by mouth daily.     No current facility-administered medications for this visit.    Allergies:   Statins and Zetia    Social History:  The patient  reports that he has never smoked. He has never used smokeless tobacco. He reports that he does not drink alcohol or use illicit drugs.   Family History:  The patient's family history includes Cancer in his  father; Emphysema in his mother.    ROS:  Please see the history of present illness.   Otherwise, review of systems are positive for none.   All other systems are reviewed and negative.    PHYSICAL EXAM: VS:  BP 118/54 mmHg  Pulse 74  Ht 6' (1.829 m)  Wt 207 lb 6.4 oz (94.076 kg)  BMI 28.12 kg/m2  SpO2 97% , BMI Body mass index is 28.12 kg/(m^2). GEN: Well nourished, well developed, in no acute distress HEENT: normal Neck: no JVD, carotid bruits, or masses Cardiac: RRR; no murmurs, rubs, or gallops,no edema  Respiratory:  clear to auscultation bilaterally, normal work of breathing GI: soft, nontender, nondistended, + BS MS: no deformity or atrophy Skin: warm and dry, no rash Neuro:  Strength and sensation are intact Psych: euthymic mood, full affect   EKG:  EKG is ordered today. The ekg ordered today demonstrates normal sinus rhythm with right bundle-branch block.  No change since the previous tracing of 02/21/15 post cardioversion at 1038.   Recent Labs: 02/21/2015: ALT 16*; BUN 18; Creatinine, Ser 1.04; Hemoglobin 16.9; Magnesium 2.1; Platelets 188; Potassium 3.2*; Sodium 142; TSH 1.916    Lipid Panel    Component Value Date/Time   CHOL 220* 12/28/2014 1057   TRIG 225.0* 12/28/2014 1057   HDL 33.00* 12/28/2014 1057   CHOLHDL 7 12/28/2014 1057   VLDL 45.0* 12/28/2014 1057   LDLCALC 166* 11/21/2013 0400   LDLDIRECT 146.0 12/28/2014 1057      Wt Readings from Last 3 Encounters:  02/22/15 207 lb 6.4 oz (94.076 kg)  02/21/15 205 lb (92.987 kg)  01/23/15 205 lb 12.8 oz (93.35 kg)         ASSESSMENT AND PLAN:  1.  Paroxysmal atrial fibrillation, now back in normal sinus rhythm following cardioversion in the emergency room yesterday. 2.  Dizziness secondary to paroxysmal atrial fibrillation, resolved 3.  Hypokalemia secondary to thiazide diuretic 4.  Coronary artery disease with prior stents.  No angina pectoris. 5.  Essential hypertension 6.  Peripheral edema  secondary to venous insufficiency  Plan: We are decreasing his HCTZ to just 3 days a week.  Continue other medicines the same.  Okay to return to work tomorrow.  Recheck in 3 months for office visit EKG and basal metabolic panel.  He will eat bananas and other high potassium foods   Current medicines are reviewed at length with the patient today.  The patient does not have concerns regarding medicines.  The following changes have been made:  Low dose HCT to  just 3 days a week  Labs/ tests ordered today include:   Orders Placed This Encounter  Procedures  . Basic metabolic panel       Signed, Darlin Coco MD 02/22/2015 1:16 PM    [facility] [address], [place], [place]  [address] Phone: [phone]; Fax: [phone]

## 2015-02-23 LAB — URINE CULTURE

## 2015-02-23 NOTE — Addendum Note (Signed)
Addended by: Freada Bergeron on: 02/23/2015 05:40 PM   Modules accepted: Orders

## 2015-02-27 ENCOUNTER — Other Ambulatory Visit: Payer: Self-pay | Admitting: Cardiology

## 2015-02-27 ENCOUNTER — Encounter (INDEPENDENT_AMBULATORY_CARE_PROVIDER_SITE_OTHER): Payer: Medicare Other | Admitting: Ophthalmology

## 2015-02-27 DIAGNOSIS — H2513 Age-related nuclear cataract, bilateral: Secondary | ICD-10-CM | POA: Diagnosis not present

## 2015-02-27 DIAGNOSIS — H43813 Vitreous degeneration, bilateral: Secondary | ICD-10-CM

## 2015-02-27 DIAGNOSIS — H3532 Exudative age-related macular degeneration: Secondary | ICD-10-CM | POA: Diagnosis not present

## 2015-02-27 DIAGNOSIS — H35033 Hypertensive retinopathy, bilateral: Secondary | ICD-10-CM | POA: Diagnosis not present

## 2015-02-27 DIAGNOSIS — H3531 Nonexudative age-related macular degeneration: Secondary | ICD-10-CM

## 2015-02-27 DIAGNOSIS — I10 Essential (primary) hypertension: Secondary | ICD-10-CM | POA: Diagnosis not present

## 2015-03-09 ENCOUNTER — Emergency Department (HOSPITAL_COMMUNITY): Payer: Medicare Other

## 2015-03-09 ENCOUNTER — Encounter (HOSPITAL_COMMUNITY): Payer: Self-pay | Admitting: Emergency Medicine

## 2015-03-09 ENCOUNTER — Inpatient Hospital Stay (HOSPITAL_COMMUNITY)
Admission: EM | Admit: 2015-03-09 | Discharge: 2015-03-15 | DRG: 246 | Disposition: A | Discharge status: Home / Self Care | Payer: Medicare Other | Attending: Cardiology | Admitting: Cardiology

## 2015-03-09 DIAGNOSIS — G629 Polyneuropathy, unspecified: Secondary | ICD-10-CM | POA: Diagnosis present

## 2015-03-09 DIAGNOSIS — Z8673 Personal history of transient ischemic attack (TIA), and cerebral infarction without residual deficits: Secondary | ICD-10-CM | POA: Diagnosis not present

## 2015-03-09 DIAGNOSIS — R911 Solitary pulmonary nodule: Secondary | ICD-10-CM | POA: Diagnosis present

## 2015-03-09 DIAGNOSIS — T466X5A Adverse effect of antihyperlipidemic and antiarteriosclerotic drugs, initial encounter: Secondary | ICD-10-CM | POA: Diagnosis present

## 2015-03-09 DIAGNOSIS — R0789 Other chest pain: Secondary | ICD-10-CM | POA: Diagnosis not present

## 2015-03-09 DIAGNOSIS — I4510 Unspecified right bundle-branch block: Secondary | ICD-10-CM | POA: Diagnosis present

## 2015-03-09 DIAGNOSIS — Z9861 Coronary angioplasty status: Secondary | ICD-10-CM

## 2015-03-09 DIAGNOSIS — Z955 Presence of coronary angioplasty implant and graft: Secondary | ICD-10-CM

## 2015-03-09 DIAGNOSIS — I25110 Atherosclerotic heart disease of native coronary artery with unstable angina pectoris: Secondary | ICD-10-CM

## 2015-03-09 DIAGNOSIS — I214 Non-ST elevation (NSTEMI) myocardial infarction: Secondary | ICD-10-CM | POA: Diagnosis present

## 2015-03-09 DIAGNOSIS — I10 Essential (primary) hypertension: Secondary | ICD-10-CM | POA: Diagnosis not present

## 2015-03-09 DIAGNOSIS — G4733 Obstructive sleep apnea (adult) (pediatric): Secondary | ICD-10-CM | POA: Diagnosis present

## 2015-03-09 DIAGNOSIS — R079 Chest pain, unspecified: Secondary | ICD-10-CM | POA: Diagnosis not present

## 2015-03-09 DIAGNOSIS — E785 Hyperlipidemia, unspecified: Secondary | ICD-10-CM | POA: Diagnosis not present

## 2015-03-09 DIAGNOSIS — T82857A Stenosis of cardiac prosthetic devices, implants and grafts, initial encounter: Principal | ICD-10-CM | POA: Diagnosis present

## 2015-03-09 DIAGNOSIS — I6340 Cerebral infarction due to embolism of unspecified cerebral artery: Secondary | ICD-10-CM | POA: Diagnosis not present

## 2015-03-09 DIAGNOSIS — Z7982 Long term (current) use of aspirin: Secondary | ICD-10-CM | POA: Diagnosis not present

## 2015-03-09 DIAGNOSIS — I639 Cerebral infarction, unspecified: Secondary | ICD-10-CM | POA: Diagnosis not present

## 2015-03-09 DIAGNOSIS — I480 Paroxysmal atrial fibrillation: Secondary | ICD-10-CM | POA: Diagnosis present

## 2015-03-09 DIAGNOSIS — I2510 Atherosclerotic heart disease of native coronary artery without angina pectoris: Secondary | ICD-10-CM

## 2015-03-09 DIAGNOSIS — Y831 Surgical operation with implant of artificial internal device as the cause of abnormal reaction of the patient, or of later complication, without mention of misadventure at the time of the procedure: Secondary | ICD-10-CM | POA: Diagnosis present

## 2015-03-09 DIAGNOSIS — Z7901 Long term (current) use of anticoagulants: Secondary | ICD-10-CM | POA: Diagnosis not present

## 2015-03-09 DIAGNOSIS — D696 Thrombocytopenia, unspecified: Secondary | ICD-10-CM | POA: Diagnosis not present

## 2015-03-09 DIAGNOSIS — Z8781 Personal history of (healed) traumatic fracture: Secondary | ICD-10-CM

## 2015-03-09 DIAGNOSIS — Z888 Allergy status to other drugs, medicaments and biological substances status: Secondary | ICD-10-CM

## 2015-03-09 DIAGNOSIS — Z8546 Personal history of malignant neoplasm of prostate: Secondary | ICD-10-CM

## 2015-03-09 DIAGNOSIS — I252 Old myocardial infarction: Secondary | ICD-10-CM | POA: Diagnosis not present

## 2015-03-09 DIAGNOSIS — Z89022 Acquired absence of left finger(s): Secondary | ICD-10-CM | POA: Diagnosis not present

## 2015-03-09 DIAGNOSIS — I200 Unstable angina: Secondary | ICD-10-CM | POA: Insufficient documentation

## 2015-03-09 DIAGNOSIS — Z79899 Other long term (current) drug therapy: Secondary | ICD-10-CM

## 2015-03-09 LAB — I-STAT TROPONIN, ED: Troponin i, poc: 0.42 ng/mL (ref 0.00–0.08)

## 2015-03-09 LAB — BASIC METABOLIC PANEL
Anion gap: 7 (ref 5–15)
Anion gap: 4 — ABNORMAL LOW (ref 5–15)
BUN: 16 mg/dL (ref 6–20)
BUN: 12 mg/dL (ref 6–20)
CALCIUM: 9.6 mg/dL (ref 8.9–10.3)
CHLORIDE: 105 mmol/L (ref 101–111)
CO2: 23 mmol/L (ref 22–32)
CO2: 26 mmol/L (ref 22–32)
CREATININE: 0.81 mg/dL (ref 0.61–1.24)
Calcium: 9 mg/dL (ref 8.9–10.3)
Chloride: 111 mmol/L (ref 101–111)
Creatinine, Ser: 0.86 mg/dL (ref 0.61–1.24)
GFR calc Af Amer: >60 mL/min (ref >60)
GFR calc non Af Amer: >60 mL/min (ref >60)
GFR calc non Af Amer: >60 mL/min (ref >60)
GLUCOSE: 105 mg/dL — AB (ref 65–99)
Glucose, Bld: 77 mg/dL (ref 65–99)
POTASSIUM: 6.8 mmol/L — AB (ref 3.5–5.1)
Potassium: 3.4 mmol/L — ABNORMAL LOW (ref 3.5–5.1)
SODIUM: 135 mmol/L (ref 135–145)
Sodium: 141 mmol/L (ref 135–145)

## 2015-03-09 LAB — CBC
HEMATOCRIT: 42.3 % (ref 39.0–52.0)
HEMATOCRIT: 43.1 % (ref 39.0–52.0)
Hemoglobin: 14.8 g/dL (ref 13.0–17.0)
Hemoglobin: 14.6 g/dL (ref 13.0–17.0)
MCH: 29.6 pg (ref 26.0–34.0)
MCH: 28.8 pg (ref 26.0–34.0)
MCHC: 35 g/dL (ref 30.0–36.0)
MCHC: 33.9 g/dL (ref 30.0–36.0)
MCV: 84.6 fL (ref 78.0–100.0)
MCV: 85 fL (ref 78.0–100.0)
Platelets: 99 10*3/uL — ABNORMAL LOW (ref 150–400)
Platelets: 148 10*3/uL — ABNORMAL LOW (ref 150–400)
RBC: 5 MIL/uL (ref 4.22–5.81)
RBC: 5.07 MIL/uL (ref 4.22–5.81)
RDW: 14.4 % (ref 11.5–15.5)
RDW: 14 % (ref 11.5–15.5)
WBC: 6.2 10*3/uL (ref 4.0–10.5)
WBC: 6.9 10*3/uL (ref 4.0–10.5)

## 2015-03-09 LAB — PROTIME-INR
INR: 1.15 (ref 0.00–1.49)
Prothrombin Time: 14.9 seconds (ref 11.6–15.2)

## 2015-03-09 LAB — TROPONIN I: Troponin I: 1.45 ng/mL (ref <0.031)

## 2015-03-09 MED ORDER — SODIUM CHLORIDE 0.9 % IJ SOLN
3.0000 mL | Freq: Two times a day (BID) | INTRAMUSCULAR | Status: DC
  Administered 2015-03-09 – 2015-03-10 (×3): 3 mL via INTRAVENOUS

## 2015-03-09 MED ORDER — ASPIRIN EC 81 MG PO TBEC: 81.0000 mg | DELAYED_RELEASE_TABLET | Freq: Every day | ORAL | Status: DC

## 2015-03-09 MED ORDER — ASPIRIN 300 MG RE SUPP: 300.0000 mg | RECTAL | Status: AC

## 2015-03-09 MED ORDER — HEPARIN (PORCINE) IN NACL 100-0.45 UNIT/ML-% IJ SOLN
1500.0000 [IU]/h | INTRAMUSCULAR | Status: DC
  Administered 2015-03-09: 1150 [IU]/h via INTRAVENOUS
  Filled 2015-03-09: qty 250

## 2015-03-09 MED ORDER — METOPROLOL SUCCINATE ER 50 MG PO TB24
75.0000 mg | ORAL_TABLET | Freq: Every day | ORAL | Status: DC
  Administered 2015-03-10 – 2015-03-15 (×6): 75 mg via ORAL
  Filled 2015-03-09 (×12): qty 1

## 2015-03-09 MED ORDER — SODIUM CHLORIDE 0.9 % WEIGHT BASED INFUSION
3.0000 mL/kg/h | INTRAVENOUS | Status: DC
  Administered 2015-03-10: 3 mL/kg/h via INTRAVENOUS

## 2015-03-09 MED ORDER — SODIUM CHLORIDE 0.9 % WEIGHT BASED INFUSION: 1.0000 mL/kg/h | INTRAVENOUS | Status: DC

## 2015-03-09 MED ORDER — CALCIUM GLUCONATE 10 % IV SOLN
1.0000 g | Freq: Once | INTRAVENOUS | Status: AC
  Administered 2015-03-09: 1 g via INTRAVENOUS
  Filled 2015-03-09: qty 10

## 2015-03-09 MED ORDER — SODIUM CHLORIDE 0.9 % IV SOLN: 250.0000 mL | INTRAVENOUS | Status: DC | PRN

## 2015-03-09 MED ORDER — ACETAMINOPHEN 325 MG PO TABS: 650.0000 mg | ORAL_TABLET | ORAL | Status: DC | PRN

## 2015-03-09 MED ORDER — NITROGLYCERIN 0.4 MG SL SUBL: 0.4000 mg | SUBLINGUAL_TABLET | SUBLINGUAL | Status: DC | PRN

## 2015-03-09 MED ORDER — ASPIRIN EC 81 MG PO TBEC
81.0000 mg | DELAYED_RELEASE_TABLET | Freq: Every day | ORAL | Status: DC
  Administered 2015-03-10: 81 mg via ORAL
  Filled 2015-03-09: qty 1

## 2015-03-09 MED ORDER — ASPIRIN 81 MG PO CHEW
81.0000 mg | CHEWABLE_TABLET | ORAL | Status: AC
  Administered 2015-03-10: 81 mg via ORAL
  Filled 2015-03-09: qty 1

## 2015-03-09 MED ORDER — SODIUM CHLORIDE 0.9 % IJ SOLN: 3.0000 mL | INTRAMUSCULAR | Status: DC | PRN

## 2015-03-09 MED ORDER — ONDANSETRON HCL 4 MG/2ML IJ SOLN: 4.0000 mg | Freq: Four times a day (QID) | INTRAMUSCULAR | Status: DC | PRN

## 2015-03-09 MED ORDER — ASPIRIN 81 MG PO CHEW
324.0000 mg | CHEWABLE_TABLET | ORAL | Status: AC
  Administered 2015-03-09: 324 mg via ORAL
  Filled 2015-03-09: qty 4

## 2015-03-09 NOTE — ED Notes (Signed)
Pt reports intermitted chest pain since Sunday. Pt reports a tightness that comes into the center of his chest and radiates into his back at times. Presently pt has no pt. Pt denies any sob, n/v.

## 2015-03-09 NOTE — ED Notes (Signed)
Paged Cards for Dr. Mingo Amber [phone]

## 2015-03-09 NOTE — Progress Notes (Signed)
Patient'sTroponin 1.45. Patient has no complaints of chest pain or discomfort at this time. MD notified and aware. Will continue to monitor.

## 2015-03-09 NOTE — Consult Note (Signed)
Patient ID: Dominic [name] MRN: [id number], DOB/AGE: 1933-09-28   Admit date: 03/09/2015   Primary Physician:  Melinda Crutch, MD Primary Cardiologist: Dr. Mare Ferrari   Pt. Profile: 81 year old male with history of CAD status post prior stenting to the RCA, paroxysmal afib, status post recent electrical cardioversion 02/21/15, who presents to the emergency department with a one-week history of intermittent chest discomfort and an abnormal troponin  Problem List  Past Medical History  Diagnosis Date  . Dyslipidemia     a. Intol of statins.  . Lung nodule     , right upper lobe  . Obstructive sleep apnea   . Prostate cancer   . Amputated finger     a. L index d/t to dog bite.  . Concussion   . Coronary artery disease 09/2004 and 04/2005    a. Stent to prox and mid RCA 08/2001. b. DES to RCA for ISR 08/2004. c. [facility] to [facility] for [facility]R 05/2005. d. Low risk nuc 10/2013 (done for CP in setting of new AF).  . Hypertension   . RBBB (right bundle branch block)   . Atrial fibrillation with RVR     a. New onset diagnosed 11/20/2013, spont converted to NSR.  Marland Kitchen OSA (obstructive sleep apnea)     Past Surgical History  Procedure Laterality Date  . Coronary stent placement    . Prostatectomy    . L knee ligament replacement    . Anterior cruciate ligament repair Right   . Tonsillectomy    . Tooth extraction       Allergies  Allergies  Allergen Reactions  . Statins Other (See Comments)    Myalgia  . Zetia [Ezetimibe] Other (See Comments)    myalgia    HPI  The patient is a 81 year old male, followed by Dr. Mare Ferrari, who presented to the ED today with complaint of chest pain. The patient has a history of known coronary disease. He has had prior stenting with BMS to the RCA and subsequent Cypher DES to the RCA in 3/06. LHC in 12/06 demonstrated significant RCA ISR which was treated with a Taxus DES. Other history includes HTN, HL, sleep apnea, and PAF on chronic anticoagulation with Eliquis  for a CHA2DS2 VASc score of 4. His most recent Myoview was in June 2015 and was negative for ischemia. EF was 56%. EF by 2D echo was also normal at 55-60% and no WMA.   He was recently evaluated in the [facility] emergency department on 02/21/2015 with complaints of palpitations and near-syncope. He was found to be in recurrent atrial fibrillation with rapid ventricular response. This was apparently in the setting of hypokalemia with potassium level of 3.2. He was given potassium supplementation. He also required electrical cardioversion while in the emergency department. He was successfully converted to normal sinus rhythm and was discharged home from the ED without admission. He was seen in clinic by Dr. Mare Ferrari the following day for post ED follow-up. He was noted to still be in sinus rhythm stable from a cardiovascular standpoint.  Now present back to the [facility] emergency department with complaint of recurrent chest discomfort off and on for the past week. His symptoms feel similar to his prior symptoms before undergoing PCI to the RCA. He notes diffuse chest pressure radiating to bilateral shoulders. He denies any associated dyspnea and no palpitations. Denies any exertional component. It is nonpleuritic. No exacerbating factors. He has not tried any therapies at home. Given his recurrent symptoms, he  decided to come to the ED for evaluation.  EKG shows sinus rhythm with a chronic right bundle branch block. Stat troponin in the ED is abnormal at 0.42. His platelet count is also severely low at 99. This is down from 188 2 weeks ago. Hgb is stable at 14.8. He denies any abnormal bleeding. K is also abnormal at 6.8.    Home Medications  Prior to Admission medications   Medication Sig Start Date End Date Taking? Authorizing Domenica Weightman  apixaban (ELIQUIS) 5 MG TABS tablet Take 1 tablet (5 mg total) by mouth 2 (two) times daily. 09/21/14  Yes Sueanne Margarita, MD  aspirin EC 81 MG EC tablet Take 1  tablet (81 mg total) by mouth daily. 11/21/13  Yes Dayna N Dunn, PA-C  hydrochlorothiazide (MICROZIDE) 12.5 MG capsule Take 12.5 mg by mouth as directed. ONE TABLET BY MOUTH ON Monday, Wednesday, AND Friday   Yes Historical Pattricia Weiher, MD  metoprolol succinate (TOPROL-XL) 25 MG 24 hr tablet Take 3 tablets (75 mg total) by mouth daily. 12/14/14  Yes Sueanne Margarita, MD  Multiple Vitamins-Minerals (PRESERVISION AREDS) CAPS Take 1 capsule by mouth 2 (two) times daily.   Yes Historical Armin Yerger, MD  nitroGLYCERIN (NITROSTAT) 0.4 MG SL tablet PLACE ONE TABLET UNDER THE TONGUE EVERY 5 MINUTES AS NEEDED FOR CHEST PAIN 02/27/15  Yes Darlin Coco, MD  Piracetam POWD Take 1 scoop by mouth daily.    Yes Historical Zaniya Mcaulay, MD  Psyllium (METAMUCIL PO) Take 15 mLs by mouth 2 (two) times daily. Mix in water and drink   Yes Historical Jacquelene Kopecky, MD  Ubiquinol 100 MG CAPS Take 100 mg by mouth daily.   Yes Historical Sakira Dahmer, MD  NON FORMULARY tocotrienols are vitamin E supplement, Lutein-zeaxanthis, and piracetam    Historical Graham Hyun, MD    Family History  Family History  Problem Relation Age of Onset  . Emphysema Mother   . Cancer Father     Social History  Social History   Social History  . Marital Status: Married    Spouse Name: N/A  . Number of Children: N/A  . Years of Education: N/A   Occupational History  . Not on file.   Social History Main Topics  . Smoking status: Never Smoker   . Smokeless tobacco: Never Used  . Alcohol Use: No  . Drug Use: No  . Sexual Activity: Not on file   Other Topics Concern  . Not on file   Social History Narrative     Review of Systems General:  No chills, fever, night sweats or weight changes.  Cardiovascular:  No chest pain, dyspnea on exertion, edema, orthopnea, palpitations, paroxysmal nocturnal dyspnea. Dermatological: No rash, lesions/masses Respiratory: No cough, dyspnea Urologic: No hematuria, dysuria Abdominal:   No nausea, vomiting,  diarrhea, bright red blood per rectum, melena, or hematemesis Neurologic:  No visual changes, wkns, changes in mental status. All other systems reviewed and are otherwise negative except as noted above.  Physical Exam  Blood pressure 139/76, pulse 54, temperature 97.9 F (36.6 C), temperature source Oral, resp. rate 24, height 6' (1.829 m), weight 205 lb (92.987 kg), SpO2 97 %.  General: Pleasant, NAD Psych: Normal affect. Neuro: Alert and oriented X 3. Moves all extremities spontaneously. HEENT: Normal  Neck: Supple without bruits or JVD. Lungs:  decreased breath sounds bilaterally Heart: RRR no s3, s4, or murmurs. Abdomen: Soft, non-tender, non-distended, BS + x 4.  Extremities: No clubbing, cyanosis or edema. DP/PT/Radials 2+ and equal bilaterally.  Labs  Troponin [facility] Test)  Recent Labs  03/09/15 1603  TROPIPOC 0.42*   No results for input(s): CKTOTAL, CKMB, TROPONINI in the last 72 hours. Lab Results  Component Value Date   WBC 6.2 03/09/2015   HGB 14.8 03/09/2015   HCT 42.3 03/09/2015   MCV 84.6 03/09/2015   PLT 99* 03/09/2015   No results for input(s): NA, K, CL, CO2, BUN, CREATININE, CALCIUM, PROT, BILITOT, ALKPHOS, ALT, AST, GLUCOSE in the last 168 hours.  Invalid input(s): LABALBU Lab Results  Component Value Date   CHOL 220* 12/28/2014   HDL 33.00* 12/28/2014   LDLCALC 166* 11/21/2013   TRIG 225.0* 12/28/2014   No results found for: DDIMER   Radiology/Studies  Dg Chest 2 View  03/09/2015   CLINICAL DATA:  Chest tightness for 2 or 3 days, worse today. Abnormal EKG. History of atrial fibrillation and stents. Initial encounter.  EXAM: CHEST  2 VIEW  COMPARISON:  02/21/2015 and 04/19/2014 radiographs.  FINDINGS: The heart size and mediastinal contours are stable. Coronary stents are noted. There is stable elevation of the right hemidiaphragm with associated bibasilar atelectasis or scarring. No edema, confluent airspace opacity or significant pleural  effusion. The bones appear unchanged.  IMPRESSION: Stable chest.  No acute cardiopulmonary process.   Electronically Signed   By: Richardean Sale M.D.   On: 03/09/2015 16:19   Ct Head Wo Contrast  02/21/2015   CLINICAL DATA:  Dizziness following fall. Patient receiving blood thinner medication. Atrial fibrillation.  EXAM: CT HEAD WITHOUT CONTRAST  TECHNIQUE: Contiguous axial images were obtained from the base of the skull through the vertex without intravenous contrast.  COMPARISON:  April 19, 2014  FINDINGS: There is moderate generalized atrophy with the ventricles appearing slightly larger in proportion than the sulci. There is no intracranial mass, hemorrhage, extra-axial fluid collection, or midline shift. There is slight small vessel disease in the centra semiovale bilaterally. Elsewhere gray-white compartments appear normal. No acute infarct evident. Bony calvarium appears intact. The mastoid air cells are clear.  IMPRESSION: Atrophy with a possible degree of concomitant communicating hydrocephalus. There is slight periventricular small vessel disease. No intracranial mass, hemorrhage, or acute appearing infarct. No extra-axial fluid collections are appreciable on this study.   Electronically Signed   By: Lowella Grip III M.D.   On: 02/21/2015 08:59   Dg Chest Port 1 View  02/21/2015   CLINICAL DATA:  Dizziness and atrial fibrillation  EXAM: PORTABLE CHEST - 1 VIEW  COMPARISON:  April 19, 2014  FINDINGS: There is stable scarring in the right lower lobe region. There is no edema or consolidation. Heart size and pulmonary vascularity are normal. No adenopathy. No bone lesions. There is a stent in the right coronary artery region.  IMPRESSION: No edema or consolidation. Stable scarring on the right. No change in cardiac silhouette.   Electronically Signed   By: Lowella Grip III M.D.   On: 02/21/2015 08:07    ECG  NSR with chronic RBBB.     ASSESSMENT AND PLAN  1. [place]/NSTEMI patient  has a known history of CAD status post initial PCI to the RCA in March 2006, followed by repeat intervention  Dec 2006 for in-stent restenosis. His symptoms are somewhat similar to his prior angina however he denies any exertional components. He had a negative Myoview in June 2015 however given his history he needs to be restudied. Will continue to cycle cardiac enzymes 3 to assess trend and will plan for a  likely LHC in the am.  2. PAF: s/p recent DCCV 02/21/15. In NSR on telemetry. Continue metoprolol. Hold Eliquis.   3. HTN: BP is controlled. Continue home meds  4. HLD: poorly controlled. Most recent LDL was 166.  H/o statin intolerance. Given his risk factors, need to consider retrial of statin. Can consider Pravastatin.   5. OSA: continue CPAP  6. Thrombocytopenia: Platelet count is 99, which is a significant drop compared to 2 weeks ago when level was 188.  Monitor for bleeding. Follow levels closely.  7. Hyperkalemia: Initial labs show K of 6.8. Renal function is normal. Should this was a hemolyzed sample. We'll order a stat repeat potassium.   Signed, Lyda Jester, PA-C 03/09/2015, 5:27 PM  I have seen and examined the patient along with SIMMONS, BRITTAINY, PA-C.  I have reviewed the chart, notes and new data.  I agree with PA's note.  Key new complaints: symptoms are highly consistent with unstable angina, reminiscent of previous MI Key examination changes: no signs CHF, split S2 due to RBBB, no arrhythmia; traumatic amputations left index finger (racoon) and right distal phalanx (push-brake) Key new findings / data: mildly elevated POC troponin, elevated K 6.8 (suspect error with normal renal function and no drugs that raise K levels, actually on HCTZ and recently with low K) and mildly decreased platelets 99K. Subtle ST depression anterior leads  NSTEMI  PLAN:  Recommend radial approach coronary angiography tomorrow, preferably >24 h since last Eliquis dose (this AM), as  long as K is rechecked and normal. Note DCCV about 16 days ago, resume Eliquis post cath. Also note history of in stent restenosis in 2006, so better suited for DES, despite need for anticoagulant therapy. This procedure has been fully reviewed with the patient and written informed consent has been obtained. He apparently was evaluated for PCSK9 inhibitors in lipid clinic and deemed not a candidate (but I do not see why). Also note recent tentative diagnosis of communicating hydrocephalus   Sanda Klein, MD, [facility] [phone] 03/09/2015, 6:57 PM

## 2015-03-09 NOTE — ED Provider Notes (Signed)
CSN: [id number]     Arrival date & time 03/09/15  1536 History   First MD Initiated Contact with Patient 03/09/15 1541     Chief Complaint  Patient presents with  . Chest Pain     (Consider location/radiation/quality/duration/timing/severity/associated sxs/prior Treatment) HPI Comments: Having intermittent chest pain for the past 2-3 days. Mild associated SOB, no nausea or vomiting. No fever or cough. Hx of cardiac cath x 3, had stents every time. This chest pain feels similar to prior cardiac event.   Patient is a 81 y.o. male presenting with chest pain. The history is provided by the patient.  Chest Pain Pain location:  Substernal area Pain quality: tightness   Pain radiates to:  Does not radiate Pain radiates to the back: no   Pain severity:  Mild Onset quality:  Gradual Duration:  3 days Timing:  Intermittent Progression:  Unchanged Chronicity:  Recurrent Context: at rest   Relieved by:  Nothing Worsened by:  Nothing tried Associated symptoms: shortness of breath   Associated symptoms: no abdominal pain, no cough, no fever and not vomiting     Past Medical History  Diagnosis Date  . Dyslipidemia     a. Intol of statins.  . Lung nodule     , right upper lobe  . Obstructive sleep apnea   . Prostate cancer   . Amputated finger     a. L index d/t to dog bite.  . Concussion   . Coronary artery disease 09/2004 and 04/2005    a. Stent to prox and mid RCA 08/2001. b. DES to RCA for ISR 08/2004. c. DES to [facility] for ISR 05/2005. d. Low risk nuc 10/2013 (done for CP in setting of new AF).  . Hypertension   . RBBB (right bundle branch block)   . Atrial fibrillation with RVR     a. New onset diagnosed 11/20/2013, spont converted to NSR.  Marland Kitchen OSA (obstructive sleep apnea)    Past Surgical History  Procedure Laterality Date  . Coronary stent placement    . Prostatectomy    . L knee ligament replacement    . Anterior cruciate ligament repair Right   . Tonsillectomy    . Tooth  extraction     Family History  Problem Relation Age of Onset  . Emphysema Mother   . Cancer Father    Social History  Substance Use Topics  . Smoking status: Never Smoker   . Smokeless tobacco: Never Used  . Alcohol Use: No    Review of Systems  Constitutional: Negative for fever.  Respiratory: Positive for shortness of breath. Negative for cough.   Cardiovascular: Negative for chest pain and leg swelling.  Gastrointestinal: Negative for vomiting and abdominal pain.  All other systems reviewed and are negative.     Allergies  Statins and Zetia  Home Medications   Prior to Admission medications   Medication Sig Start Date End Date Taking? Authorizing Provider  apixaban (ELIQUIS) 5 MG TABS tablet Take 1 tablet (5 mg total) by mouth 2 (two) times daily. 09/21/14   Sueanne Margarita, MD  aspirin EC 81 MG EC tablet Take 1 tablet (81 mg total) by mouth daily. 11/21/13   Dayna N Dunn, PA-C  hydrochlorothiazide (MICROZIDE) 12.5 MG capsule Take 12.5 mg by mouth as directed. ONE TABLET BY MOUTH ON Monday, Wednesday, AND Friday    Historical Provider, MD  metoprolol succinate (TOPROL-XL) 25 MG 24 hr tablet Take 3 tablets (75 mg total) by mouth  daily. 12/14/14   Sueanne Margarita, MD  Multiple Vitamins-Minerals (PRESERVISION AREDS) CAPS Take 1 capsule by mouth 2 (two) times daily.    Historical Provider, MD  nitroGLYCERIN (NITROSTAT) 0.4 MG SL tablet PLACE ONE TABLET UNDER THE TONGUE EVERY 5 MINUTES AS NEEDED FOR CHEST PAIN 02/27/15   Darlin Coco, MD  NON FORMULARY tocotrienols are vitamin E supplement, Lutein-zeaxanthis, and piracetam    Historical Provider, MD  Piracetam POWD Take 1 scoop by mouth daily.     Historical Provider, MD  Psyllium (METAMUCIL PO) Take 15 mLs by mouth 2 (two) times daily. Mix in water and drink    Historical Provider, MD  Ubiquinol 100 MG CAPS Take 100 mg by mouth daily.    Historical Provider, MD   BP 137/65 mmHg  Pulse 52  Temp(Src) 97.9 F (36.6 C) (Oral)   Resp 16  Ht 6' (1.829 m)  Wt 205 lb (92.987 kg)  BMI 27.80 kg/m2  SpO2 98% Physical Exam  Constitutional: He is oriented to person, place, and time. He appears well-developed and well-nourished. No distress.  HENT:  Head: Normocephalic and atraumatic.  Mouth/Throat: No oropharyngeal exudate.  Eyes: EOM are normal. Pupils are equal, round, and reactive to light.  Neck: Normal range of motion. Neck supple.  Cardiovascular: Normal rate and regular rhythm.  Exam reveals no friction rub.   No murmur heard. Pulmonary/Chest: Effort normal and breath sounds normal. No respiratory distress. He has no wheezes. He has no rales.  Abdominal: He exhibits no distension. There is no tenderness. There is no rebound.  Musculoskeletal: Normal range of motion. He exhibits no edema.  Neurological: He is alert and oriented to person, place, and time.  Skin: He is not diaphoretic.  Nursing note and vitals reviewed.   ED Course  Procedures (including critical care time) Labs Review Labs Reviewed  [place], ED    Imaging Review Dg Chest 2 View  03/09/2015   CLINICAL DATA:  Chest tightness for 2 or 3 days, worse today. Abnormal EKG. History of atrial fibrillation and stents. Initial encounter.  EXAM: CHEST  2 VIEW  COMPARISON:  02/21/2015 and 04/19/2014 radiographs.  FINDINGS: The heart size and mediastinal contours are stable. Coronary stents are noted. There is stable elevation of the right hemidiaphragm with associated bibasilar atelectasis or scarring. No edema, confluent airspace opacity or significant pleural effusion. The bones appear unchanged.  IMPRESSION: Stable chest.  No acute cardiopulmonary process.   Electronically Signed   By: Richardean Sale M.D.   On: 03/09/2015 16:19   I have personally reviewed and evaluated these images and lab results as part of my medical decision-making.   EKG Interpretation   Date/Time:  Thursday March 09 2015 15:39:10  EDT Ventricular Rate:  55 PR Interval:  168 QRS Duration: 154 QT Interval:  449 QTC Calculation: 429 R Axis:   94 Text Interpretation:  Sinus rhythm RBBB and LPFB No significant change  since last tracing Confirmed by Mingo Amber  MD, [place] ([id number]) on 03/09/2015  4:10:07 PM      MDM   Final diagnoses:  NSTEMI (non-ST elevated myocardial infarction)  Chest pain, unspecified chest pain type    89M with cardiac history presents with chest pain. Mild central tightness, intermittent, no alleviating or exacerbating factors. No fever or cough. He was sent over from his PCP's office.   Concern for ACS since similar to prior.  With hx of ACS, will check troponin and labs. EKG  here similar to prior.   Troponin resulted as elevated, heparin initiated. Cardiology consulted.  Cards admitted. K elevated, likely hemolyzed, calcium given prophylactically.  Evelina Bucy, MD 03/10/15 [phone]

## 2015-03-09 NOTE — ED Notes (Signed)
MD at bedside; admitting

## 2015-03-09 NOTE — Progress Notes (Addendum)
ANTICOAGULATION CONSULT NOTE - Initial Consult  Pharmacy Consult for heparin  Indication: chest pain/ACS  Allergies  Allergen Reactions  . Statins Other (See Comments)    Myalgia  . Zetia [Ezetimibe] Other (See Comments)    myalgia    Patient Measurements: Height: 6' (182.9 cm) Weight: 205 lb (92.987 kg) IBW/kg (Calculated) : 77.6 Heparin Dosing Weight: 93 kg  Vital Signs: Temp: 97.9 F (36.6 C) (09/22 1545) Temp Source: Oral (09/22 1545) BP: 137/65 mmHg (09/22 1545) Pulse Rate: 52 (09/22 1545)  Labs: No results for input(s): HGB, HCT, PLT, APTT, LABPROT, INR, HEPARINUNFRC, CREATININE, CKTOTAL, CKMB, TROPONINI in the last 72 hours.  Estimated Creatinine Clearance: 63.2 mL/min (by C-G formula based on Cr of 1.04).   Medical History: Past Medical History  Diagnosis Date  . Dyslipidemia     a. Intol of statins.  . Lung nodule     , right upper lobe  . Obstructive sleep apnea   . Prostate cancer   . Amputated finger     a. L index d/t to dog bite.  . Concussion   . Coronary artery disease 09/2004 and 04/2005    a. Stent to prox and mid RCA 08/2001. b. DES to RCA for ISR 08/2004. c. DES to [facility] for ISR 05/2005. d. Low risk nuc 10/2013 (done for CP in setting of new AF).  . Hypertension   . RBBB (right bundle branch block)   . Atrial fibrillation with RVR     a. New onset diagnosed 11/20/2013, spont converted to NSR.  Marland Kitchen OSA (obstructive sleep apnea)     Medications:   (Not in a hospital admission)  Assessment: 81 yo male admitted with chest pain since Sunday. Pt is on Eliquis for AFib, PTA, last dose was this morning at 0600. CBC stable. Initial troponin is positive.  Will initiate heparin for ACS.   Goal of Therapy:  aptt 66-102 Heparin level 0.3-0.7 units/ml Monitor platelets by anticoagulation protocol: Yes    Plan:  -Withhold heparin bolus since patient is on Eliquis PTA, begin heparin at 1150 units/hr -Daily HL, aptt, CBC -Monitor s/sx bleeding -F/u  cardiology recommendations   Hughes Better, PharmD, BCPS Clinical Pharmacist Pager: [phone] 03/09/2015 4:22 PM

## 2015-03-10 ENCOUNTER — Encounter (HOSPITAL_COMMUNITY)
Admission: EM | Disposition: A | Discharge status: Home / Self Care | Payer: Self-pay | Source: Home / Self Care | Attending: Cardiology

## 2015-03-10 DIAGNOSIS — I200 Unstable angina: Secondary | ICD-10-CM

## 2015-03-10 DIAGNOSIS — I214 Non-ST elevation (NSTEMI) myocardial infarction: Secondary | ICD-10-CM | POA: Diagnosis present

## 2015-03-10 HISTORY — PX: CARDIAC CATHETERIZATION: SHX172

## 2015-03-10 LAB — TROPONIN I
TROPONIN I: 1.76 ng/mL — AB (ref <0.031)
TROPONIN I: 1.59 ng/mL — AB (ref <0.031)

## 2015-03-10 LAB — CBC
HEMATOCRIT: 42.5 % (ref 39.0–52.0)
HEMOGLOBIN: 14.1 g/dL (ref 13.0–17.0)
MCH: 28.4 pg (ref 26.0–34.0)
MCHC: 33.2 g/dL (ref 30.0–36.0)
MCV: 85.7 fL (ref 78.0–100.0)
Platelets: 152 10*3/uL (ref 150–400)
RBC: 4.96 MIL/uL (ref 4.22–5.81)
RDW: 14.2 % (ref 11.5–15.5)
WBC: 6.8 10*3/uL (ref 4.0–10.5)

## 2015-03-10 LAB — BASIC METABOLIC PANEL
ANION GAP: 5 (ref 5–15)
BUN: 14 mg/dL (ref 6–20)
CALCIUM: 9.5 mg/dL (ref 8.9–10.3)
CO2: 28 mmol/L (ref 22–32)
Chloride: 109 mmol/L (ref 101–111)
Creatinine, Ser: 0.87 mg/dL (ref 0.61–1.24)
GFR calc Af Amer: >60 mL/min (ref >60)
Glucose, Bld: 81 mg/dL (ref 65–99)
POTASSIUM: 4 mmol/L (ref 3.5–5.1)
SODIUM: 142 mmol/L (ref 135–145)

## 2015-03-10 LAB — APTT
APTT: 40 s — AB (ref 24–37)
aPTT: 48 seconds — ABNORMAL HIGH (ref 24–37)

## 2015-03-10 LAB — POCT ACTIVATED CLOTTING TIME: ACTIVATED CLOTTING TIME: 509 s

## 2015-03-10 LAB — HEPARIN LEVEL (UNFRACTIONATED): Heparin Unfractionated: 0.48 IU/mL (ref 0.30–0.70)

## 2015-03-10 LAB — PROTIME-INR
INR: 1.17 (ref 0.00–1.49)
PROTHROMBIN TIME: 15.1 s (ref 11.6–15.2)

## 2015-03-10 SURGERY — LEFT HEART CATH AND CORONARY ANGIOGRAPHY
Anesthesia: LOCAL

## 2015-03-10 MED ORDER — VERAPAMIL HCL 2.5 MG/ML IV SOLN
INTRAVENOUS | Status: AC
  Filled 2015-03-10: qty 2

## 2015-03-10 MED ORDER — SODIUM CHLORIDE 0.9 % WEIGHT BASED INFUSION: 3.0000 mL/kg/h | INTRAVENOUS | Status: AC

## 2015-03-10 MED ORDER — MORPHINE SULFATE (PF) 2 MG/ML IV SOLN: 2.0000 mg | INTRAVENOUS | Status: DC | PRN

## 2015-03-10 MED ORDER — SODIUM CHLORIDE 0.9 % IV SOLN
0.2500 mg/kg/h | Freq: Once | INTRAVENOUS | Status: DC
  Filled 2015-03-10: qty 250

## 2015-03-10 MED ORDER — BIVALIRUDIN BOLUS VIA INFUSION - CUPID
INTRAVENOUS | Status: DC | PRN
  Administered 2015-03-10: 69.3 mg via INTRAVENOUS

## 2015-03-10 MED ORDER — NITROGLYCERIN 1 MG/10 ML FOR IR/CATH LAB
INTRA_ARTERIAL | Status: AC
  Filled 2015-03-10: qty 10

## 2015-03-10 MED ORDER — IOHEXOL 350 MG/ML SOLN
INTRAVENOUS | Status: DC | PRN
  Administered 2015-03-10: 170 mL via INTRA_ARTERIAL

## 2015-03-10 MED ORDER — HEART ATTACK BOUNCING BOOK
Freq: Once | Status: AC
  Administered 2015-03-10: 22:00:00
  Filled 2015-03-10: qty 1

## 2015-03-10 MED ORDER — VERAPAMIL HCL 2.5 MG/ML IV SOLN
INTRA_ARTERIAL | Status: DC | PRN
  Administered 2015-03-10: 14:00:00 via INTRA_ARTERIAL

## 2015-03-10 MED ORDER — HEPARIN SODIUM (PORCINE) 1000 UNIT/ML IJ SOLN
INTRAMUSCULAR | Status: AC
  Filled 2015-03-10: qty 1

## 2015-03-10 MED ORDER — HYDRALAZINE HCL 20 MG/ML IJ SOLN
INTRAMUSCULAR | Status: DC | PRN
  Administered 2015-03-10: 10 mg via INTRAVENOUS

## 2015-03-10 MED ORDER — TICAGRELOR 90 MG PO TABS
ORAL_TABLET | ORAL | Status: AC
  Filled 2015-03-10: qty 2

## 2015-03-10 MED ORDER — TICAGRELOR 90 MG PO TABS
90.0000 mg | ORAL_TABLET | Freq: Two times a day (BID) | ORAL | Status: DC
  Administered 2015-03-11 – 2015-03-14 (×7): 90 mg via ORAL
  Filled 2015-03-10 (×7): qty 1

## 2015-03-10 MED ORDER — ACETAMINOPHEN 325 MG PO TABS: 650.0000 mg | ORAL_TABLET | ORAL | Status: DC | PRN

## 2015-03-10 MED ORDER — ASPIRIN 81 MG PO CHEW
81.0000 mg | CHEWABLE_TABLET | Freq: Every day | ORAL | Status: DC
Start: 2015-03-10 — End: 2015-03-15
  Administered 2015-03-11 – 2015-03-15 (×5): 81 mg via ORAL
  Filled 2015-03-10 (×5): qty 1

## 2015-03-10 MED ORDER — HEPARIN SODIUM (PORCINE) 1000 UNIT/ML IJ SOLN
INTRAMUSCULAR | Status: DC | PRN
  Administered 2015-03-10: 4500 [IU] via INTRAVENOUS

## 2015-03-10 MED ORDER — SODIUM CHLORIDE 0.9 % IJ SOLN: 3.0000 mL | INTRAMUSCULAR | Status: DC | PRN

## 2015-03-10 MED ORDER — HYDRALAZINE HCL 20 MG/ML IJ SOLN
INTRAMUSCULAR | Status: AC
  Filled 2015-03-10: qty 1

## 2015-03-10 MED ORDER — ANGIOPLASTY BOOK
Freq: Once | Status: AC
  Administered 2015-03-10: 21:00:00
  Filled 2015-03-10: qty 1

## 2015-03-10 MED ORDER — TICAGRELOR 90 MG PO TABS
ORAL_TABLET | ORAL | Status: DC | PRN
  Administered 2015-03-10: 180 mg via ORAL

## 2015-03-10 MED ORDER — BIVALIRUDIN 250 MG IV SOLR
INTRAVENOUS | Status: AC
  Filled 2015-03-10: qty 250

## 2015-03-10 MED ORDER — LIDOCAINE HCL (PF) 1 % IJ SOLN
INTRAMUSCULAR | Status: AC
  Filled 2015-03-10: qty 30

## 2015-03-10 MED ORDER — SODIUM CHLORIDE 0.9 % IV SOLN
250.0000 mg | INTRAVENOUS | Status: DC | PRN
  Administered 2015-03-10: 1.75 mg/kg/h via INTRAVENOUS

## 2015-03-10 MED ORDER — SODIUM CHLORIDE 0.9 % IJ SOLN
3.0000 mL | Freq: Two times a day (BID) | INTRAMUSCULAR | Status: DC
  Administered 2015-03-11 – 2015-03-14 (×5): 3 mL via INTRAVENOUS

## 2015-03-10 MED ORDER — SODIUM CHLORIDE 0.9 % IV SOLN
1.0000 mg/kg/h | INTRAVENOUS | Status: AC
  Administered 2015-03-10: 1 mg/kg/h via INTRAVENOUS
  Filled 2015-03-10 (×2): qty 250

## 2015-03-10 MED ORDER — ONDANSETRON HCL 4 MG/2ML IJ SOLN: 4.0000 mg | Freq: Four times a day (QID) | INTRAMUSCULAR | Status: DC | PRN

## 2015-03-10 MED ORDER — HEPARIN (PORCINE) IN NACL 2-0.9 UNIT/ML-% IJ SOLN
INTRAMUSCULAR | Status: AC
  Filled 2015-03-10: qty 1500

## 2015-03-10 MED ORDER — SODIUM CHLORIDE 0.9 % IV SOLN: 250.0000 mL | INTRAVENOUS | Status: DC | PRN

## 2015-03-10 MED ORDER — NITROGLYCERIN 1 MG/10 ML FOR IR/CATH LAB
INTRA_ARTERIAL | Status: DC | PRN
  Administered 2015-03-10: 15:00:00

## 2015-03-10 SURGICAL SUPPLY — 22 items
BALLN ANGIOSCULPT RX 3.0X10 (BALLOONS) ×2
BALLN EMERGE MR 2.0X12 (BALLOONS) ×2
BALLOON ANGIOSCULPT RX 3.0X10 (BALLOONS) IMPLANT
BALLOON EMERGE MR 2.0X12 (BALLOONS) IMPLANT
CATH INFINITI 5 FR JL3.5 (CATHETERS) ×1 IMPLANT
CATH INFINITI 5FR ANG PIGTAIL (CATHETERS) ×2 IMPLANT
CATH INFINITI 5FR JR4 125CM (CATHETERS) IMPLANT
CATH OPTITORQUE TIG 4.0 5F (CATHETERS) ×2 IMPLANT
CATH VISTA GUIDE 6FR JR4 (CATHETERS) ×1 IMPLANT
CATH VISTA GUIDE 6FR XB3.5 (CATHETERS) ×1 IMPLANT
DEVICE RAD COMP TR BAND LRG (VASCULAR PRODUCTS) ×2 IMPLANT
GLIDESHEATH SLEND A-KIT 6F 22G (SHEATH) ×2 IMPLANT
KIT ENCORE 26 ADVANTAGE (KITS) ×1 IMPLANT
KIT HEART LEFT (KITS) ×2 IMPLANT
PACK CARDIAC CATHETERIZATION (CUSTOM PROCEDURE TRAY) ×2 IMPLANT
STENT SYNERGY DES 2.25X16 (Permanent Stent) ×1 IMPLANT
SYR MEDRAD MARK V 150ML (SYRINGE) ×2 IMPLANT
TRANSDUCER W/STOPCOCK (MISCELLANEOUS) ×2 IMPLANT
TUBING CIL FLEX 10 FLL-RA (TUBING) ×2 IMPLANT
WIRE ASAHI PROWATER 180CM (WIRE) ×2 IMPLANT
WIRE HI TORQ VERSACORE-J 145CM (WIRE) ×2 IMPLANT
WIRE SAFE-T 1.5MM-J .035X260CM (WIRE) ×2 IMPLANT

## 2015-03-10 NOTE — Progress Notes (Signed)
Patient Name: Dominic [name] Date of Encounter: 03/10/2015   SUBJECTIVE  Feeling well. No chest pain, sob or palpitations.   CURRENT MEDS . aspirin EC  81 mg Oral Daily  . metoprolol succinate  75 mg Oral Daily  . sodium chloride  3 mL Intravenous Q12H    OBJECTIVE  Filed Vitals:   03/09/15 1915 03/09/15 1930 03/09/15 2042 03/10/15 0452  BP: 150/74 139/72 162/72 149/72  Pulse: 57 56 55 52  Temp:   98.4 F (36.9 C) 97.8 F (36.6 C)  TempSrc:   Oral Oral  Resp: 22 19 20 18   Height:   6' (1.829 m)   Weight:   203 lb 9.6 oz (92.352 kg)   SpO2: 94% 97% 97% 97%    Intake/Output Summary (Last 24 hours) at 03/10/15 0838 Last data filed at 03/10/15 0705  Gross per 24 hour  Intake 395.59 ml  Output    500 ml  Net -104.41 ml   Filed Weights   03/09/15 1545 03/09/15 2042  Weight: 205 lb (92.987 kg) 203 lb 9.6 oz (92.352 kg)    PHYSICAL EXAM  General: Pleasant, NAD. Neuro: Alert and oriented X 3. Moves all extremities spontaneously. Psych: Normal affect. HEENT:  Normal  Neck: Supple without bruits or JVD. Lungs:  Resp regular and unlabored, CTA. Heart: RRR no s3, s4, or murmurs. Abdomen: Soft, non-tender, non-distended, BS + x 4.  Extremities: No clubbing, cyanosis or edema. DP/PT/Radials 2+ and equal bilaterally. Amputations left index finger (racoon) and right distal phalanx (push-brake).  Accessory Clinical Findings  CBC  Recent Labs  03/09/15 2126 03/10/15 0259  WBC 6.9 6.8  HGB 14.6 14.1  HCT 43.1 42.5  MCV 85.0 85.7  PLT 148* 601   Basic Metabolic Panel  Recent Labs  03/09/15 2126 03/10/15 0259  NA 141 142  K 3.4* 4.0  CL 111 109  CO2 26 28  GLUCOSE 105* 81  BUN 12 14  CREATININE 0.81 0.87  CALCIUM 9.6 9.5   Cardiac Enzymes  Recent Labs  03/09/15 2126 03/10/15 0259  TROPONINI 1.45* 1.76*   BNP  TELE  NSR with PVCs  Radiology/Studies  Dg Chest 2 View  03/09/2015   CLINICAL DATA:  Chest tightness for 2 or 3 days, worse  today. Abnormal EKG. History of atrial fibrillation and stents. Initial encounter.  EXAM: CHEST  2 VIEW  COMPARISON:  02/21/2015 and 04/19/2014 radiographs.  FINDINGS: The heart size and mediastinal contours are stable. Coronary stents are noted. There is stable elevation of the right hemidiaphragm with associated bibasilar atelectasis or scarring. No edema, confluent airspace opacity or significant pleural effusion. The bones appear unchanged.  IMPRESSION: Stable chest.  No acute cardiopulmonary process.   Electronically Signed   By: Richardean Sale M.D.   On: 03/09/2015 16:19   Ct Head Wo Contrast  02/21/2015   CLINICAL DATA:  Dizziness following fall. Patient receiving blood thinner medication. Atrial fibrillation.  EXAM: CT HEAD WITHOUT CONTRAST  TECHNIQUE: Contiguous axial images were obtained from the base of the skull through the vertex without intravenous contrast.  COMPARISON:  April 19, 2014  FINDINGS: There is moderate generalized atrophy with the ventricles appearing slightly larger in proportion than the sulci. There is no intracranial mass, hemorrhage, extra-axial fluid collection, or midline shift. There is slight small vessel disease in the centra semiovale bilaterally. Elsewhere gray-white compartments appear normal. No acute infarct evident. Bony calvarium appears intact. The mastoid air cells are clear.  IMPRESSION: Atrophy with  a possible degree of concomitant communicating hydrocephalus. There is slight periventricular small vessel disease. No intracranial mass, hemorrhage, or acute appearing infarct. No extra-axial fluid collections are appreciable on this study.   Electronically Signed   By: Lowella Grip III M.D.   On: 02/21/2015 08:59   Dg Chest Port 1 View  02/21/2015   CLINICAL DATA:  Dizziness and atrial fibrillation  EXAM: PORTABLE CHEST - 1 VIEW  COMPARISON:  April 19, 2014  FINDINGS: There is stable scarring in the right lower lobe region. There is no edema or  consolidation. Heart size and pulmonary vascularity are normal. No adenopathy. No bone lesions. There is a stent in the right coronary artery region.  IMPRESSION: No edema or consolidation. Stable scarring on the right. No change in cardiac silhouette.   Electronically Signed   By: Lowella Grip III M.D.   On: 02/21/2015 08:07    ASSESSMENT AND PLAN 1. USA/NSTEMI  - patient has a known history of CAD status post initial PCI to the RCA in March 2006, followed by repeat intervention Dec 2006 for in-stent restenosis. His symptoms are somewhat similar to his prior angina however he denies any exertional components. He had a negative Myoview in June 2015.  - Troponin trending up. Plan for cath today. Continue IV heparin. Held Eliquis (DCCV about 3 weeks ago), resume post cath.   2. PAF: s/p recent DCCV 02/21/15. In NSR on telemetry. Continue metoprolol. Held Eliquis.   3. HTN: BP mildly elevated. Continue home meds. Consider adding amlodipine.   4. HLD: poorly controlled. Most recent LDL was 166. H/o statin intolerance. Given his risk factors, need to consider retrial of statin. He apparently was evaluated for PCSK9 inhibitors in lipid clinic and deemed not a candidate . Can consider Pravastatin.   5. OSA: continue CPAP  6. Thrombocytopenia: Platelet count is 99, which is a significant drop compared to 2 weeks ago when level was 188.Now 152.  7. Hyperkalemia: Initial labs show K of 6.8. Renal function is normal. Should this was a hemolyzed sample. K today of 4.2  Signed, Bhagat,Bhavinkumar PA-C Pager [phone]  I have personally seen and examined this patient with B. Bhagat, PA-C. I agree with the assessment and plan as outlined above. He is admitted with unstable angina/NSTEMI. History of CAD with prior PCI/stenting of the RCA in 2006. Eliquis has been held for 24 hours. Radial approach today with cath. Reviewed procedure with pt and family. He agrees to proceed.    MCALHANY,CHRISTOPHER 03/10/2015 11:11 AM

## 2015-03-10 NOTE — Progress Notes (Signed)
UR Completed Brenda Graves-Bigelow, RN,BSN [phone]  

## 2015-03-10 NOTE — Progress Notes (Signed)
Patient refused bilateral groin prep for heart catheterization. Patient educated on the reason for also prepping his groin. Will continue to monitor.

## 2015-03-10 NOTE — Progress Notes (Signed)
ANTICOAGULATION CONSULT NOTE - Follow Up Consult  Pharmacy Consult for heparin Indication: PAF and NSTEMI   Labs:  Recent Labs  03/09/15 1600 03/09/15 1608 03/09/15 2126 03/10/15 0259  HGB 14.8  --  14.6 14.1  HCT 42.3  --  43.1 42.5  PLT 99*  --  148* 152  APTT  --   --   --  40*  LABPROT  --   --  14.9 15.1  INR  --   --  1.15 1.17  HEPARINUNFRC  --   --   --  0.48  CREATININE  --  0.86 0.81  --   TROPONINI  --   --  1.45*  --      Assessment: 81yo male subtherapeutic on heparin with initial dosing while Eliquis on hold though lab was drawn <4hr after gtt started.  Goal of Therapy:  aPTT 66-102 seconds   Plan:  Will increase heparin gtt by 2 units/kg/hr to 1300 units/hr and check PTT in 8hr.  Wynona Neat, PharmD, BCPS  03/10/2015,4:14 AM

## 2015-03-10 NOTE — H&P (View-Only) (Signed)
Patient Name: Dominic [name] Date of Encounter: 03/10/2015   SUBJECTIVE  Feeling well. No chest pain, sob or palpitations.   CURRENT MEDS . aspirin EC  81 mg Oral Daily  . metoprolol succinate  75 mg Oral Daily  . sodium chloride  3 mL Intravenous Q12H    OBJECTIVE  Filed Vitals:   03/09/15 1915 03/09/15 1930 03/09/15 2042 03/10/15 0452  BP: 150/74 139/72 162/72 149/72  Pulse: 57 56 55 52  Temp:   98.4 F (36.9 C) 97.8 F (36.6 C)  TempSrc:   Oral Oral  Resp: 22 19 20 18   Height:   6' (1.829 m)   Weight:   203 lb 9.6 oz (92.352 kg)   SpO2: 94% 97% 97% 97%    Intake/Output Summary (Last 24 hours) at 03/10/15 0838 Last data filed at 03/10/15 0705  Gross per 24 hour  Intake 395.59 ml  Output    500 ml  Net -104.41 ml   Filed Weights   03/09/15 1545 03/09/15 2042  Weight: 205 lb (92.987 kg) 203 lb 9.6 oz (92.352 kg)    PHYSICAL EXAM  General: Pleasant, NAD. Neuro: Alert and oriented X 3. Moves all extremities spontaneously. Psych: Normal affect. HEENT:  Normal  Neck: Supple without bruits or JVD. Lungs:  Resp regular and unlabored, CTA. Heart: RRR no s3, s4, or murmurs. Abdomen: Soft, non-tender, non-distended, BS + x 4.  Extremities: No clubbing, cyanosis or edema. DP/PT/Radials 2+ and equal bilaterally. Amputations left index finger (racoon) and right distal phalanx (push-brake).  Accessory Clinical Findings  CBC  Recent Labs  03/09/15 2126 03/10/15 0259  WBC 6.9 6.8  HGB 14.6 14.1  HCT 43.1 42.5  MCV 85.0 85.7  PLT 148* 952   Basic Metabolic Panel  Recent Labs  03/09/15 2126 03/10/15 0259  NA 141 142  K 3.4* 4.0  CL 111 109  CO2 26 28  GLUCOSE 105* 81  BUN 12 14  CREATININE 0.81 0.87  CALCIUM 9.6 9.5   Cardiac Enzymes  Recent Labs  03/09/15 2126 03/10/15 0259  TROPONINI 1.45* 1.76*   BNP  TELE  NSR with PVCs  Radiology/Studies  Dg Chest 2 View  03/09/2015   CLINICAL DATA:  Chest tightness for 2 or 3 days, worse  today. Abnormal EKG. History of atrial fibrillation and stents. Initial encounter.  EXAM: CHEST  2 VIEW  COMPARISON:  02/21/2015 and 04/19/2014 radiographs.  FINDINGS: The heart size and mediastinal contours are stable. Coronary stents are noted. There is stable elevation of the right hemidiaphragm with associated bibasilar atelectasis or scarring. No edema, confluent airspace opacity or significant pleural effusion. The bones appear unchanged.  IMPRESSION: Stable chest.  No acute cardiopulmonary process.   Electronically Signed   By: Richardean Sale M.D.   On: 03/09/2015 16:19   Ct Head Wo Contrast  02/21/2015   CLINICAL DATA:  Dizziness following fall. Patient receiving blood thinner medication. Atrial fibrillation.  EXAM: CT HEAD WITHOUT CONTRAST  TECHNIQUE: Contiguous axial images were obtained from the base of the skull through the vertex without intravenous contrast.  COMPARISON:  April 19, 2014  FINDINGS: There is moderate generalized atrophy with the ventricles appearing slightly larger in proportion than the sulci. There is no intracranial mass, hemorrhage, extra-axial fluid collection, or midline shift. There is slight small vessel disease in the centra semiovale bilaterally. Elsewhere gray-white compartments appear normal. No acute infarct evident. Bony calvarium appears intact. The mastoid air cells are clear.  IMPRESSION: Atrophy with  a possible degree of concomitant communicating hydrocephalus. There is slight periventricular small vessel disease. No intracranial mass, hemorrhage, or acute appearing infarct. No extra-axial fluid collections are appreciable on this study.   Electronically Signed   By: Lowella Grip III M.D.   On: 02/21/2015 08:59   Dg Chest Port 1 View  02/21/2015   CLINICAL DATA:  Dizziness and atrial fibrillation  EXAM: PORTABLE CHEST - 1 VIEW  COMPARISON:  April 19, 2014  FINDINGS: There is stable scarring in the right lower lobe region. There is no edema or  consolidation. Heart size and pulmonary vascularity are normal. No adenopathy. No bone lesions. There is a stent in the right coronary artery region.  IMPRESSION: No edema or consolidation. Stable scarring on the right. No change in cardiac silhouette.   Electronically Signed   By: Lowella Grip III M.D.   On: 02/21/2015 08:07    ASSESSMENT AND PLAN 1. USA/NSTEMI  - patient has a known history of CAD status post initial PCI to the RCA in March 2006, followed by repeat intervention Dec 2006 for in-stent restenosis. His symptoms are somewhat similar to his prior angina however he denies any exertional components. He had a negative Myoview in June 2015.  - Troponin trending up. Plan for cath today. Continue IV heparin. Held Eliquis (DCCV about 3 weeks ago), resume post cath.   2. PAF: s/p recent DCCV 02/21/15. In NSR on telemetry. Continue metoprolol. Held Eliquis.   3. HTN: BP mildly elevated. Continue home meds. Consider adding amlodipine.   4. HLD: poorly controlled. Most recent LDL was 166. H/o statin intolerance. Given his risk factors, need to consider retrial of statin. He apparently was evaluated for PCSK9 inhibitors in lipid clinic and deemed not a candidate . Can consider Pravastatin.   5. OSA: continue CPAP  6. Thrombocytopenia: Platelet count is 99, which is a significant drop compared to 2 weeks ago when level was 188.Now 152.  7. Hyperkalemia: Initial labs show K of 6.8. Renal function is normal. Should this was a hemolyzed sample. K today of 4.2  Signed, Bhagat,Bhavinkumar PA-C Pager [phone]  I have personally seen and examined this patient with B. Bhagat, PA-C. I agree with the assessment and plan as outlined above. He is admitted with unstable angina/NSTEMI. History of CAD with prior PCI/stenting of the RCA in 2006. Eliquis has been held for 24 hours. Radial approach today with cath. Reviewed procedure with pt and family. He agrees to proceed.    MCALHANY,CHRISTOPHER 03/10/2015 11:11 AM

## 2015-03-10 NOTE — Progress Notes (Addendum)
ANTICOAGULATION CONSULT NOTE - Initial Consult  Pharmacy Consult for heparin  Indication: chest pain/ACS  Allergies  Allergen Reactions  . Statins Other (See Comments)    Myalgia  . Zetia [Ezetimibe] Other (See Comments)    myalgia    Patient Measurements: Height: 6' (182.9 cm) Weight: 203 lb 9.6 oz (92.352 kg) IBW/kg (Calculated) : 77.6 Heparin Dosing Weight: 93 kg  Vital Signs: Temp: 98.1 F (36.7 C) (09/23 1257) Temp Source: Oral (09/23 1257) BP: 159/81 mmHg (09/23 1257) Pulse Rate: 51 (09/23 1257)  Labs:  Recent Labs  03/09/15 1600 03/09/15 1608 03/09/15 2126 03/10/15 0259 03/10/15 0815 03/10/15 1230  HGB 14.8  --  14.6 14.1  --   --   HCT 42.3  --  43.1 42.5  --   --   PLT 99*  --  148* 152  --   --   APTT  --   --   --  40*  --  48*  LABPROT  --   --  14.9 15.1  --   --   INR  --   --  1.15 1.17  --   --   HEPARINUNFRC  --   --   --  0.48  --   --   CREATININE  --  0.86 0.81 0.87  --   --   TROPONINI  --   --  1.45* 1.76* 1.59*  --     Estimated Creatinine Clearance: 75.6 mL/min (by C-G formula based on Cr of 0.87).   Medical History: Past Medical History  Diagnosis Date  . Dyslipidemia     a. Intol of statins.  . Lung nodule     , right upper lobe  . Obstructive sleep apnea   . Prostate cancer   . Amputated finger     a. L index d/t to dog bite.  . Concussion   . Coronary artery disease 09/2004 and 04/2005    a. Stent to prox and mid RCA 08/2001. b. DES to RCA for ISR 08/2004. c. DES to [facility] for ISR 05/2005. d. Low risk nuc 10/2013 (done for CP in setting of new AF).  . Hypertension   . RBBB (right bundle branch block)   . Atrial fibrillation with RVR     a. New onset diagnosed 11/20/2013, spont converted to NSR.  Marland Kitchen OSA (obstructive sleep apnea)     Medications:  Prescriptions prior to admission  Medication Sig Dispense Refill Last Dose  . apixaban (ELIQUIS) 5 MG TABS tablet Take 1 tablet (5 mg total) by mouth 2 (two) times daily. 180 tablet  1 03/09/2015 at 0600  . aspirin EC 81 MG EC tablet Take 1 tablet (81 mg total) by mouth daily. 30 tablet 6 03/09/2015 at Unknown time  . hydrochlorothiazide (MICROZIDE) 12.5 MG capsule Take 12.5 mg by mouth as directed. ONE TABLET BY MOUTH ON Monday, Wednesday, AND Friday   03/08/2015 at Unknown time  . metoprolol succinate (TOPROL-XL) 25 MG 24 hr tablet Take 3 tablets (75 mg total) by mouth daily. 90 tablet 6 03/09/2015 at 0600  . Multiple Vitamins-Minerals (PRESERVISION AREDS) CAPS Take 1 capsule by mouth 2 (two) times daily.   03/09/2015 at Unknown time  . nitroGLYCERIN (NITROSTAT) 0.4 MG SL tablet PLACE ONE TABLET UNDER THE TONGUE EVERY 5 MINUTES AS NEEDED FOR CHEST PAIN 25 tablet 3 Past Month at Unknown time  . Piracetam POWD Take 1 scoop by mouth daily.    03/09/2015 at Unknown time  .  Psyllium (METAMUCIL PO) Take 15 mLs by mouth 2 (two) times daily. Mix in water and drink   Past Week at Unknown time  . Ubiquinol 100 MG CAPS Take 100 mg by mouth daily.   03/09/2015 at Unknown time  . NON FORMULARY tocotrienols are vitamin E supplement, Lutein-zeaxanthis, and piracetam   Taking    Assessment: 81 yo male admitted with chest pain since Sunday. Pt is on Eliquis for AFib, PTA, last dose was yesterday at 0600. CBC stable. Initial troponin is positive.  Planning to go for cath today. aptt is SUBtherapeutic, will increase rate.  CBC stable.   Goal of Therapy:  aptt 66-102 Heparin level 0.3-0.7 units/ml Monitor platelets by anticoagulation protocol: Yes    Plan:  -Increase heparin to 1500 units/hr -Daily HL, aptt, CBC -Monitor s/sx bleeding -F/u after cath   Hughes Better, PharmD, BCPS Clinical Pharmacist Pager: [phone] 03/10/2015 1:28 PM

## 2015-03-10 NOTE — Interval H&P Note (Signed)
History and Physical Interval Note:  03/10/2015 1:53 PM  Dominic [name]  has presented today for surgery, with the diagnosis of cp  The various methods of treatment have been discussed with the patient and family. After consideration of risks, benefits and other options for treatment, the patient has consented to  Procedure(s): Left Heart Cath and Coronary Angiography (N/A) as a surgical intervention .  The patient's history has been reviewed, patient examined, no change in status, stable for surgery.  I have reviewed the patient's chart and labs.  Questions were answered to the patient's satisfaction.     [name]

## 2015-03-10 NOTE — Interval H&P Note (Signed)
Cath Lab Visit (complete for each Cath Lab visit)  Clinical Evaluation Leading to the Procedure:   ACS: Yes.    Non-ACS:    Anginal Classification: CCS IV  Anti-ischemic medical therapy: Minimal Therapy (1 class of medications)  Non-Invasive Test Results: No non-invasive testing performed  Prior CABG: No previous CABG      History and Physical Interval Note:  03/10/2015 1:54 PM  [name]  has presented today for surgery, with the diagnosis of cp  The various methods of treatment have been discussed with the patient and family. After consideration of risks, benefits and other options for treatment, the patient has consented to  Procedure(s): Left Heart Cath and Coronary Angiography (N/A) as a surgical intervention .  The patient's history has been reviewed, patient examined, no change in status, stable for surgery.  I have reviewed the patient's chart and labs.  Questions were answered to the patient's satisfaction.     [name]

## 2015-03-10 NOTE — Care Management Note (Signed)
Case Management Note  Patient Details  Name: GRAESON NOURI MRN: [id number] Date of Birth: 1933-12-01  Subjective/Objective:   Pt admitted for Unstable Angina/NStemi. Plan for cardiac cath 03-10-15.                  Action/Plan: CM will continue to monitor for disposition needs.    Expected Discharge Date:                  Expected Discharge Plan:     In-House Referral:     Discharge planning Services     Post Acute Care Choice:    Choice offered to:     DME Arranged:    DME Agency:     HH Arranged:    [place] Agency:     Status of Service:     Medicare Important Message Given:    Date Medicare IM Given:    Medicare IM give by:    Date Additional Medicare IM Given:    Additional Medicare Important Message give by:     If discussed at [place] of Stay Meetings, dates discussed:    Additional Comments:  Bethena Roys, RN 03/10/2015, 11:06 AM

## 2015-03-11 DIAGNOSIS — I10 Essential (primary) hypertension: Secondary | ICD-10-CM

## 2015-03-11 DIAGNOSIS — I4510 Unspecified right bundle-branch block: Secondary | ICD-10-CM

## 2015-03-11 DIAGNOSIS — G4733 Obstructive sleep apnea (adult) (pediatric): Secondary | ICD-10-CM

## 2015-03-11 LAB — BASIC METABOLIC PANEL
Anion gap: 7 (ref 5–15)
BUN: 14 mg/dL (ref 6–20)
CALCIUM: 9 mg/dL (ref 8.9–10.3)
CO2: 23 mmol/L (ref 22–32)
Chloride: 108 mmol/L (ref 101–111)
Creatinine, Ser: 0.89 mg/dL (ref 0.61–1.24)
GFR calc Af Amer: >60 mL/min (ref >60)
GLUCOSE: 97 mg/dL (ref 65–99)
Potassium: 3.5 mmol/L (ref 3.5–5.1)
Sodium: 138 mmol/L (ref 135–145)

## 2015-03-11 LAB — CBC
HEMATOCRIT: 44.2 % (ref 39.0–52.0)
Hemoglobin: 15.6 g/dL (ref 13.0–17.0)
MCH: 29.4 pg (ref 26.0–34.0)
MCHC: 35.3 g/dL (ref 30.0–36.0)
MCV: 83.4 fL (ref 78.0–100.0)
Platelets: 142 10*3/uL — ABNORMAL LOW (ref 150–400)
RBC: 5.3 MIL/uL (ref 4.22–5.81)
RDW: 14 % (ref 11.5–15.5)
WBC: 9.4 10*3/uL (ref 4.0–10.5)

## 2015-03-11 MED ORDER — LISINOPRIL 10 MG PO TABS
20.0000 mg | ORAL_TABLET | Freq: Every day | ORAL | Status: DC
  Filled 2015-03-11: qty 2

## 2015-03-11 MED ORDER — AMLODIPINE BESYLATE 5 MG PO TABS
5.0000 mg | ORAL_TABLET | Freq: Every day | ORAL | Status: DC
  Administered 2015-03-11 – 2015-03-15 (×5): 5 mg via ORAL
  Filled 2015-03-11 (×5): qty 1

## 2015-03-11 NOTE — Progress Notes (Signed)
CARDIAC REHAB PHASE I   PRE:  Rate/Rhythm: 86 SR    BP: sitting 150/56    SaO2:   MODE:  Ambulation: 350 ft   POST:  Rate/Rhythm: 100 ST    BP: sitting 176/62     SaO2:  Pt SOB with walking however he sts he is not.  BP elevated. Pt has MI book and stent card. Did not do education today as pt plans to have PCI Monday. [id number]  Josephina Shih [place] CES, ACSM 03/11/2015 8:36 AM

## 2015-03-11 NOTE — Progress Notes (Signed)
TR BAND REMOVAL  LOCATION:    right radial  DEFLATED PER PROTOCOL:    Yes.    TIME BAND OFF / DRESSING APPLIED:    12:00 AM   SITE UPON ARRIVAL:    Level 0  SITE AFTER BAND REMOVAL:    Level 0  REVERSE ALLEN'S TEST:     positive  CIRCULATION SENSATION AND MOVEMENT:    Within Normal Limits   Yes.    COMMENTS:

## 2015-03-11 NOTE — Progress Notes (Signed)
DAILY PROGRESS NOTE  Subjective:  No events overnight. Ambulating with cardiac rehab today. No further chest pain.  Objective:  Temp:  [97.6 F (36.4 C)-98.1 F (36.7 C)] 97.9 F (36.6 C) (09/24 0752) Pulse Rate:  [0-80] 77 (09/24 0752) Resp:  [0-29] 18 (09/24 0752) BP: (147-205)/(56-98) 150/56 mmHg (09/24 0752) SpO2:  [0 %-100 %] 96 % (09/24 0752) Weight:  [215 lb 9.8 oz (97.8 kg)] 215 lb 9.8 oz (97.8 kg) (09/24 0410) Weight change: 10 lb 9.8 oz (4.813 kg)  Intake/Output from previous day: 09/23 0701 - 09/24 0700 In: 8413 [P.O.:520; I.V.:941] Out: 2725 [Urine:2725]  Intake/Output from this shift: Total I/O In: 320 [P.O.:320] Out: 350 [Urine:350]  Medications: Current Facility-Administered Medications  Medication Dose Route Frequency Provider Last Rate Last Dose  . 0.9 %  sodium chloride infusion  250 mL Intravenous PRN Lorretta Harp, MD      . acetaminophen (TYLENOL) tablet 650 mg  650 mg Oral Q4H PRN Lorretta Harp, MD      . aspirin chewable tablet 81 mg  81 mg Oral Daily Lorretta Harp, MD   0 mg at 03/10/15 1500  . lisinopril (PRINIVIL,ZESTRIL) tablet 20 mg  20 mg Oral Daily Pixie Casino, MD      . metoprolol succinate (TOPROL-XL) 24 hr tablet 75 mg  75 mg Oral Daily Brittainy Erie Noe, PA-C   75 mg at 03/10/15 0947  . morphine 2 MG/ML injection 2 mg  2 mg Intravenous Q1H PRN Lorretta Harp, MD      . nitroGLYCERIN (NITROSTAT) SL tablet 0.4 mg  0.4 mg Sublingual Q5 min PRN Brittainy M Simmons, PA-C      . ondansetron [facility]) injection 4 mg  4 mg Intravenous Q6H PRN Lorretta Harp, MD      . sodium chloride 0.9 % injection 3 mL  3 mL Intravenous Q12H Lorretta Harp, MD   3 mL at 03/10/15 1930  . sodium chloride 0.9 % injection 3 mL  3 mL Intravenous PRN Lorretta Harp, MD      . ticagrelor [facility]) tablet 90 mg  90 mg Oral Q12H Lorretta Harp, MD   90 mg at 03/11/15 0500    Physical Exam: General appearance: alert and no distress Neck:  no carotid bruit and no JVD Lungs: clear to auscultation bilaterally Heart: regular rate and rhythm, S1, S2 normal, no murmur, click, rub or gallop Abdomen: soft, non-tender; bowel sounds normal; no masses,  no organomegaly Extremities: extremities normal, atraumatic, no cyanosis or edema Pulses: 2+ and symmetric Skin: Skin color, texture, turgor normal. No rashes or lesions Neurologic: Grossly normal Psych: Pleasant  Lab Results: Results for orders placed or performed during the hospital encounter of 03/09/15 (from the past 48 hour(s))  CBC     Status: Abnormal   Collection Time: 03/09/15  4:00 PM  Result Value Ref Range   WBC 6.2 4.0 - 10.5 K/uL   RBC 5.00 4.22 - 5.81 MIL/uL   Hemoglobin 14.8 13.0 - 17.0 g/dL   HCT 42.3 39.0 - 52.0 %   MCV 84.6 78.0 - 100.0 fL   MCH 29.6 26.0 - 34.0 pg   MCHC 35.0 30.0 - 36.0 g/dL   RDW 14.4 11.5 - 15.5 %   Platelets 99 (L) 150 - 400 K/uL    Comment: REPEATED TO VERIFY SPECIMEN CHECKED FOR CLOTS PLATELETS APPEAR DECREASED PLATELET COUNT CONFIRMED BY SMEAR   I-stat troponin, ED  Status: Abnormal   Collection Time: 03/09/15  4:03 PM  Result Value Ref Range   Troponin i, poc 0.42 (HH) 0.00 - 0.08 ng/mL   Comment NOTIFIED PHYSICIAN    Comment 3            Comment: Due to the release kinetics of cTnI, a negative result within the first hours of the onset of symptoms does not rule out myocardial infarction with certainty. If myocardial infarction is still suspected, repeat the test at appropriate intervals.   Basic metabolic panel     Status: Abnormal   Collection Time: 03/09/15  4:08 PM  Result Value Ref Range   Sodium 135 135 - 145 mmol/L   Potassium 6.8 (HH) 3.5 - 5.1 mmol/L    Comment: SPECIMEN HEMOLYZED. HEMOLYSIS MAY AFFECT INTEGRITY OF RESULTS. CRITICAL RESULT CALLED TO, READ BACK BY AND VERIFIED WITH: C GROSE,RN 1816 03/09/15 D BRADLEY RESULTS VERIFIED VIA RECOLLECT    Chloride 105 101 - 111 mmol/L   CO2 23 22 - 32 mmol/L    Glucose, Bld 77 65 - 99 mg/dL   BUN 16 6 - 20 mg/dL   Creatinine, Ser 0.86 0.61 - 1.24 mg/dL   Calcium 9.0 8.9 - 10.3 mg/dL   GFR calc non Af Amer >60 >60 mL/min   GFR calc Af Amer >60 >60 mL/min    Comment: (NOTE) The eGFR has been calculated using the CKD EPI equation. This calculation has not been validated in all clinical situations. eGFR's persistently <60 mL/min signify possible Chronic Kidney Disease.    Anion gap 7 5 - 15  Troponin I     Status: Abnormal   Collection Time: 03/09/15  9:26 PM  Result Value Ref Range   Troponin I 1.45 (HH) <0.031 ng/mL    Comment:        POSSIBLE MYOCARDIAL ISCHEMIA. SERIAL TESTING RECOMMENDED. CRITICAL RESULT CALLED TO, READ BACK BY AND VERIFIED WITH: HONEYCUTT,J RN 03/09/2015 2219 JORDANS   Basic metabolic panel     Status: Abnormal   Collection Time: 03/09/15  9:26 PM  Result Value Ref Range   Sodium 141 135 - 145 mmol/L   Potassium 3.4 (L) 3.5 - 5.1 mmol/L    Comment: DELTA CHECK NOTED   Chloride 111 101 - 111 mmol/L   CO2 26 22 - 32 mmol/L   Glucose, Bld 105 (H) 65 - 99 mg/dL   BUN 12 6 - 20 mg/dL   Creatinine, Ser 0.81 0.61 - 1.24 mg/dL   Calcium 9.6 8.9 - 10.3 mg/dL   GFR calc non Af Amer >60 >60 mL/min   GFR calc Af Amer >60 >60 mL/min    Comment: (NOTE) The eGFR has been calculated using the CKD EPI equation. This calculation has not been validated in all clinical situations. eGFR's persistently <60 mL/min signify possible Chronic Kidney Disease.    Anion gap 4 (L) 5 - 15  Protime-INR     Status: None   Collection Time: 03/09/15  9:26 PM  Result Value Ref Range   Prothrombin Time 14.9 11.6 - 15.2 seconds   INR 1.15 0.00 - 1.49  CBC     Status: Abnormal   Collection Time: 03/09/15  9:26 PM  Result Value Ref Range   WBC 6.9 4.0 - 10.5 K/uL   RBC 5.07 4.22 - 5.81 MIL/uL   Hemoglobin 14.6 13.0 - 17.0 g/dL   HCT 43.1 39.0 - 52.0 %   MCV 85.0 78.0 - 100.0 fL   MCH 28.8  26.0 - 34.0 pg   MCHC 33.9 30.0 - 36.0 g/dL    RDW 14.0 11.5 - 15.5 %   Platelets 148 (L) 150 - 400 K/uL  CBC     Status: None   Collection Time: 03/10/15  2:59 AM  Result Value Ref Range   WBC 6.8 4.0 - 10.5 K/uL   RBC 4.96 4.22 - 5.81 MIL/uL   Hemoglobin 14.1 13.0 - 17.0 g/dL   HCT 42.5 39.0 - 52.0 %   MCV 85.7 78.0 - 100.0 fL   MCH 28.4 26.0 - 34.0 pg   MCHC 33.2 30.0 - 36.0 g/dL   RDW 14.2 11.5 - 15.5 %   Platelets 152 150 - 400 K/uL  Heparin level (unfractionated)     Status: None   Collection Time: 03/10/15  2:59 AM  Result Value Ref Range   Heparin Unfractionated 0.48 0.30 - 0.70 IU/mL    Comment:        IF HEPARIN RESULTS ARE BELOW EXPECTED VALUES, AND PATIENT DOSAGE HAS BEEN CONFIRMED, SUGGEST FOLLOW UP TESTING OF ANTITHROMBIN III LEVELS.   APTT     Status: Abnormal   Collection Time: 03/10/15  2:59 AM  Result Value Ref Range   aPTT 40 (H) 24 - 37 seconds    Comment:        IF BASELINE aPTT IS ELEVATED, SUGGEST PATIENT RISK ASSESSMENT BE USED TO DETERMINE APPROPRIATE ANTICOAGULANT THERAPY.   Troponin I     Status: Abnormal   Collection Time: 03/10/15  2:59 AM  Result Value Ref Range   Troponin I 1.76 (HH) <0.031 ng/mL    Comment:        POSSIBLE MYOCARDIAL ISCHEMIA. SERIAL TESTING RECOMMENDED. CRITICAL VALUE NOTED.  VALUE IS CONSISTENT WITH PREVIOUSLY REPORTED AND CALLED VALUE.   Basic metabolic panel     Status: None   Collection Time: 03/10/15  2:59 AM  Result Value Ref Range   Sodium 142 135 - 145 mmol/L   Potassium 4.0 3.5 - 5.1 mmol/L   Chloride 109 101 - 111 mmol/L   CO2 28 22 - 32 mmol/L   Glucose, Bld 81 65 - 99 mg/dL   BUN 14 6 - 20 mg/dL   Creatinine, Ser 0.87 0.61 - 1.24 mg/dL   Calcium 9.5 8.9 - 10.3 mg/dL   GFR calc non Af Amer >60 >60 mL/min   GFR calc Af Amer >60 >60 mL/min    Comment: (NOTE) The eGFR has been calculated using the CKD EPI equation. This calculation has not been validated in all clinical situations. eGFR's persistently <60 mL/min signify possible Chronic  Kidney Disease.    Anion gap 5 5 - 15  Protime-INR     Status: None   Collection Time: 03/10/15  2:59 AM  Result Value Ref Range   Prothrombin Time 15.1 11.6 - 15.2 seconds   INR 1.17 0.00 - 1.49  Troponin I     Status: Abnormal   Collection Time: 03/10/15  8:15 AM  Result Value Ref Range   Troponin I 1.59 (HH) <0.031 ng/mL    Comment:        POSSIBLE MYOCARDIAL ISCHEMIA. SERIAL TESTING RECOMMENDED. CRITICAL VALUE NOTED.  VALUE IS CONSISTENT WITH PREVIOUSLY REPORTED AND CALLED VALUE.   APTT     Status: Abnormal   Collection Time: 03/10/15 12:30 PM  Result Value Ref Range   aPTT 48 (H) 24 - 37 seconds    Comment:        IF BASELINE aPTT  IS ELEVATED, SUGGEST PATIENT RISK ASSESSMENT BE USED TO DETERMINE APPROPRIATE ANTICOAGULANT THERAPY.   POCT Activated clotting time     Status: None   Collection Time: 03/10/15  2:41 PM  Result Value Ref Range   Activated Clotting Time 509 seconds  Basic metabolic panel     Status: None   Collection Time: 03/11/15  3:43 AM  Result Value Ref Range   Sodium 138 135 - 145 mmol/L   Potassium 3.5 3.5 - 5.1 mmol/L   Chloride 108 101 - 111 mmol/L   CO2 23 22 - 32 mmol/L   Glucose, Bld 97 65 - 99 mg/dL   BUN 14 6 - 20 mg/dL   Creatinine, Ser 0.89 0.61 - 1.24 mg/dL   Calcium 9.0 8.9 - 10.3 mg/dL   GFR calc non Af Amer >60 >60 mL/min   GFR calc Af Amer >60 >60 mL/min    Comment: (NOTE) The eGFR has been calculated using the CKD EPI equation. This calculation has not been validated in all clinical situations. eGFR's persistently <60 mL/min signify possible Chronic Kidney Disease.    Anion gap 7 5 - 15  CBC     Status: Abnormal   Collection Time: 03/11/15  3:43 AM  Result Value Ref Range   WBC 9.4 4.0 - 10.5 K/uL    Comment: WHITE COUNT CONFIRMED ON SMEAR   RBC 5.30 4.22 - 5.81 MIL/uL   Hemoglobin 15.6 13.0 - 17.0 g/dL   HCT 44.2 39.0 - 52.0 %   MCV 83.4 78.0 - 100.0 fL   MCH 29.4 26.0 - 34.0 pg   MCHC 35.3 30.0 - 36.0 g/dL   RDW  14.0 11.5 - 15.5 %   Platelets 142 (L) 150 - 400 K/uL    Comment: PLATELET COUNT CONFIRMED BY SMEAR    Imaging: Dg Chest 2 View  03/09/2015   CLINICAL DATA:  Chest tightness for 2 or 3 days, worse today. Abnormal EKG. History of atrial fibrillation and stents. Initial encounter.  EXAM: CHEST  2 VIEW  COMPARISON:  02/21/2015 and 04/19/2014 radiographs.  FINDINGS: The heart size and mediastinal contours are stable. Coronary stents are noted. There is stable elevation of the right hemidiaphragm with associated bibasilar atelectasis or scarring. No edema, confluent airspace opacity or significant pleural effusion. The bones appear unchanged.  IMPRESSION: Stable chest.  No acute cardiopulmonary process.   Electronically Signed   By: Richardean Sale M.D.   On: 03/09/2015 16:19    Assessment:  Active Problems:   Hypertension   Hyperlipidemia   RBBB (right bundle branch block)   PAF (paroxysmal atrial fibrillation)   OSA (obstructive sleep apnea)   Unstable angina   NSTEMI (non-ST elevated myocardial infarction)   Plan:  Successful PCI yesterday to the mid-RCA for in-stent restenosis with cutting balloon angioplasty and PCI to an OM2 lesion with a Synergy DES 2.25 x 16 mm. Cath site is intact. There is a residual LAD/D1 bifurcation lesion for staged PCI on Monday. Labs are stable. EKG shows NSR with RBBB. BP elevated from 008-676 systolic. Was on home HCTZ, but no ACE-I or ARB. On toprol XL. Will add amlodipine 5 mg daily to help with BP.  Consider ACE-I at discharge. Ambulating without difficulty today. On CPAP at home, will order. Plan for PCI on Monday.  Time Spent Directly with Patient:  15 minutes  Length of Stay:  LOS: 2 days   Pixie Casino, MD, [facility] Attending Cardiologist [place] 03/11/2015,  8:48 AM

## 2015-03-12 MED ORDER — SODIUM CHLORIDE 0.9 % IV SOLN: 250.0000 mL | INTRAVENOUS | Status: DC | PRN

## 2015-03-12 MED ORDER — SODIUM CHLORIDE 0.9 % WEIGHT BASED INFUSION: 1.0000 mL/kg/h | INTRAVENOUS | Status: DC

## 2015-03-12 MED ORDER — ASPIRIN 81 MG PO CHEW: 81.0000 mg | CHEWABLE_TABLET | ORAL | Status: DC

## 2015-03-12 MED ORDER — SODIUM CHLORIDE 0.9 % IJ SOLN: 3.0000 mL | INTRAMUSCULAR | Status: DC | PRN

## 2015-03-12 MED ORDER — SODIUM CHLORIDE 0.9 % IJ SOLN
3.0000 mL | Freq: Two times a day (BID) | INTRAMUSCULAR | Status: DC
  Administered 2015-03-13: 3 mL via INTRAVENOUS

## 2015-03-12 MED ORDER — SODIUM CHLORIDE 0.9 % WEIGHT BASED INFUSION
3.0000 mL/kg/h | INTRAVENOUS | Status: DC
  Administered 2015-03-13: 3 mL/kg/h via INTRAVENOUS

## 2015-03-12 NOTE — Progress Notes (Signed)
Patient ID: Dominic [name], male   DOB: 1933/09/23, 81 y.o.   MRN: [id number]     Subjective:    No complaints  Objective:   Temp:  [97.6 F (36.4 C)-98.1 F (36.7 C)] 98.1 F (36.7 C) (09/25 0550) Pulse Rate:  [56-77] 61 (09/25 0550) Resp:  [18-20] 18 (09/25 0550) BP: (140-160)/(56-80) 160/75 mmHg (09/25 0550) SpO2:  [96 %-100 %] 99 % (09/25 0550) Weight:  [198 lb 11.2 oz (90.13 kg)-199 lb 9.6 oz (90.538 kg)] 198 lb 11.2 oz (90.13 kg) (09/25 0550) Last BM Date: 03/11/15  Filed Weights   03/11/15 0410 03/11/15 1100 03/12/15 0550  Weight: 215 lb 9.8 oz (97.8 kg) 199 lb 9.6 oz (90.538 kg) 198 lb 11.2 oz (90.13 kg)    Intake/Output Summary (Last 24 hours) at 03/12/15 0654 Last data filed at 03/12/15 0552  Gross per 24 hour  Intake    560 ml  Output   1500 ml  Net   -940 ml    Telemetry: sinus brady  Exam:  General: NAD  Resp: CTAB  Cardiac:RRR, no m/r/g, no jvd  BW:IOMBTDH soft, NT, ND  MSK: no LE edema  Neuro: no focal deficits  Psych: appropriate affect  Lab Results:  Basic Metabolic Panel:  Recent Labs Lab 03/09/15 2126 03/10/15 0259 03/11/15 0343  NA 141 142 138  K 3.4* 4.0 3.5  CL 111 109 108  CO2 26 28 23   GLUCOSE 105* 81 97  BUN 12 14 14   CREATININE 0.81 0.87 0.89  CALCIUM 9.6 9.5 9.0    Liver Function Tests: No results for input(s): AST, ALT, ALKPHOS, BILITOT, PROT, ALBUMIN in the last 168 hours.  CBC:  Recent Labs Lab 03/09/15 2126 03/10/15 0259 03/11/15 0343  WBC 6.9 6.8 9.4  HGB 14.6 14.1 15.6  HCT 43.1 42.5 44.2  MCV 85.0 85.7 83.4  PLT 148* 152 142*    Cardiac Enzymes:  Recent Labs Lab 03/09/15 2126 03/10/15 0259 03/10/15 0815  TROPONINI 1.45* 1.76* 1.59*    BNP: No results for input(s): PROBNP in the last 8760 hours.  Coagulation:  Recent Labs Lab 03/09/15 2126 03/10/15 0259  INR 1.15 1.17    ECG:   Medications:   Scheduled Medications: . amLODipine  5 mg Oral Daily  . aspirin  81 mg Oral Daily    . metoprolol succinate  75 mg Oral Daily  . sodium chloride  3 mL Intravenous Q12H  . ticagrelor  90 mg Oral Q12H     Infusions:     PRN Medications:  sodium chloride, acetaminophen, morphine injection, nitroGLYCERIN, ondansetron (ZOFRAN) IV, sodium chloride     Assessment/Plan    1. CAD - patient with PCI to mid RCA and OM2, he is for staged procedure to LAD on Monday.  - he is on ASA, ticagrelor, metoprolol. He has statin allergy. After cath Monday would consider ACE-I - will place orders for cath, put on board for Dr Gwenlyn Found  2. HTN - started on norvasc 5mg  daily, follow bp's today.   3. PAF - eliquis held for caths, will need to resume after staged procedure Monday.      Carlyle Dolly, M.D.

## 2015-03-13 ENCOUNTER — Encounter (HOSPITAL_COMMUNITY): Payer: Self-pay | Admitting: Cardiovascular Disease

## 2015-03-13 ENCOUNTER — Encounter (HOSPITAL_COMMUNITY)
Admission: EM | Disposition: A | Discharge status: Home / Self Care | Payer: Self-pay | Source: Home / Self Care | Attending: Cardiology

## 2015-03-13 DIAGNOSIS — I480 Paroxysmal atrial fibrillation: Secondary | ICD-10-CM

## 2015-03-13 HISTORY — PX: CARDIAC CATHETERIZATION: SHX172

## 2015-03-13 LAB — CBC
HCT: 45.4 % (ref 39.0–52.0)
HEMOGLOBIN: 15.1 g/dL (ref 13.0–17.0)
MCH: 28.5 pg (ref 26.0–34.0)
MCHC: 33.3 g/dL (ref 30.0–36.0)
MCV: 85.7 fL (ref 78.0–100.0)
Platelets: 169 10*3/uL (ref 150–400)
RBC: 5.3 MIL/uL (ref 4.22–5.81)
RDW: 14.4 % (ref 11.5–15.5)
WBC: 7.8 10*3/uL (ref 4.0–10.5)

## 2015-03-13 LAB — BASIC METABOLIC PANEL
ANION GAP: 6 (ref 5–15)
BUN: 20 mg/dL (ref 6–20)
CALCIUM: 9.7 mg/dL (ref 8.9–10.3)
CO2: 26 mmol/L (ref 22–32)
Chloride: 109 mmol/L (ref 101–111)
Creatinine, Ser: 1.16 mg/dL (ref 0.61–1.24)
GFR, EST NON AFRICAN AMERICAN: 58 mL/min — AB (ref >60)
GLUCOSE: 91 mg/dL (ref 65–99)
Potassium: 4 mmol/L (ref 3.5–5.1)
SODIUM: 141 mmol/L (ref 135–145)

## 2015-03-13 LAB — PROTIME-INR
INR: 1.12 (ref 0.00–1.49)
PROTHROMBIN TIME: 14.6 s (ref 11.6–15.2)

## 2015-03-13 LAB — POCT ACTIVATED CLOTTING TIME: ACTIVATED CLOTTING TIME: 607 s

## 2015-03-13 SURGERY — CORONARY STENT INTERVENTION

## 2015-03-13 MED ORDER — SENNOSIDES-DOCUSATE SODIUM 8.6-50 MG PO TABS
1.0000 | ORAL_TABLET | Freq: Every evening | ORAL | Status: DC | PRN
  Administered 2015-03-14: 1 via ORAL
  Filled 2015-03-13: qty 1

## 2015-03-13 MED ORDER — HEPARIN (PORCINE) IN NACL 2-0.9 UNIT/ML-% IJ SOLN
INTRAMUSCULAR | Status: AC
  Filled 2015-03-13: qty 1000

## 2015-03-13 MED ORDER — VERAPAMIL HCL 2.5 MG/ML IV SOLN
INTRAVENOUS | Status: DC | PRN
  Administered 2015-03-13: 11:00:00 via INTRA_ARTERIAL

## 2015-03-13 MED ORDER — NITROGLYCERIN 1 MG/10 ML FOR IR/CATH LAB
INTRA_ARTERIAL | Status: DC | PRN
  Administered 2015-03-13: 12:00:00

## 2015-03-13 MED ORDER — IOHEXOL 350 MG/ML SOLN
INTRAVENOUS | Status: DC | PRN
  Administered 2015-03-13: 115 mL via INTRA_ARTERIAL

## 2015-03-13 MED ORDER — HEPARIN (PORCINE) IN NACL 2-0.9 UNIT/ML-% IJ SOLN
INTRAMUSCULAR | Status: AC
  Filled 2015-03-13: qty 500

## 2015-03-13 MED ORDER — SODIUM CHLORIDE 0.9 % IV SOLN: 250.0000 mL | INTRAVENOUS | Status: DC | PRN

## 2015-03-13 MED ORDER — SODIUM CHLORIDE 0.9 % IJ SOLN
3.0000 mL | Freq: Two times a day (BID) | INTRAMUSCULAR | Status: DC
  Administered 2015-03-13 – 2015-03-15 (×4): 3 mL via INTRAVENOUS

## 2015-03-13 MED ORDER — MIDAZOLAM HCL 2 MG/2ML IJ SOLN
INTRAMUSCULAR | Status: AC
  Filled 2015-03-13: qty 4

## 2015-03-13 MED ORDER — MIDAZOLAM HCL 2 MG/2ML IJ SOLN
INTRAMUSCULAR | Status: DC | PRN
  Administered 2015-03-13: 1 mg via INTRAVENOUS

## 2015-03-13 MED ORDER — VERAPAMIL HCL 2.5 MG/ML IV SOLN
INTRAVENOUS | Status: AC
  Filled 2015-03-13: qty 2

## 2015-03-13 MED ORDER — BIVALIRUDIN BOLUS VIA INFUSION - CUPID
INTRAVENOUS | Status: DC | PRN
  Administered 2015-03-13: 68.325 mg via INTRAVENOUS

## 2015-03-13 MED ORDER — ADENOSINE 12 MG/4ML IV SOLN
16.0000 mL | Freq: Once | INTRAVENOUS | Status: DC
  Filled 2015-03-13: qty 16

## 2015-03-13 MED ORDER — BIVALIRUDIN 250 MG IV SOLR
INTRAVENOUS | Status: AC
  Filled 2015-03-13: qty 250

## 2015-03-13 MED ORDER — SODIUM CHLORIDE 0.9 % IV SOLN
250.0000 mg | INTRAVENOUS | Status: DC | PRN
  Administered 2015-03-13: 1.75 mg/kg/h via INTRAVENOUS

## 2015-03-13 MED ORDER — NITROGLYCERIN 1 MG/10 ML FOR IR/CATH LAB
INTRA_ARTERIAL | Status: AC
  Filled 2015-03-13: qty 10

## 2015-03-13 MED ORDER — ADENOSINE (DIAGNOSTIC) 140MCG/KG/MIN
INTRAVENOUS | Status: DC | PRN
  Administered 2015-03-13: 140 ug/kg/min via INTRAVENOUS

## 2015-03-13 MED ORDER — SODIUM CHLORIDE 0.9 % IJ SOLN: 3.0000 mL | INTRAMUSCULAR | Status: DC | PRN

## 2015-03-13 MED ORDER — SODIUM CHLORIDE 0.9 % WEIGHT BASED INFUSION: 3.0000 mL/kg/h | INTRAVENOUS | Status: AC

## 2015-03-13 MED ORDER — LIDOCAINE HCL (PF) 1 % IJ SOLN
INTRAMUSCULAR | Status: AC
  Filled 2015-03-13: qty 30

## 2015-03-13 MED ORDER — LIDOCAINE HCL (PF) 1 % IJ SOLN
INTRAMUSCULAR | Status: DC | PRN
  Administered 2015-03-13: 5 mL via INTRADERMAL

## 2015-03-13 SURGICAL SUPPLY — 19 items
BALLN ANGIOSCULPT RX 2.0X10 (BALLOONS) ×3
BALLN EMERGE MR 2.0X12 (BALLOONS) ×3
BALLN ~~LOC~~ TREK RX 3.0X12 (BALLOONS) ×3
BALLOON ANGIOSCULPT RX 2.0X10 (BALLOONS) ×1 IMPLANT
BALLOON EMERGE MR 2.0X12 (BALLOONS) ×1 IMPLANT
BALLOON ~~LOC~~ TREK RX 3.0X12 (BALLOONS) ×1 IMPLANT
CATH VISTA GUIDE 6FR XBLAD3.5 (CATHETERS) ×2 IMPLANT
DEVICE RAD COMP TR BAND LRG (VASCULAR PRODUCTS) ×3 IMPLANT
GLIDESHEATH SLEND SS 6F .021 (SHEATH) ×3 IMPLANT
GUIDEWIRE PRESSURE COMET II (WIRE) ×2 IMPLANT
KIT ENCORE 26 ADVANTAGE (KITS) ×2 IMPLANT
KIT HEART LEFT (KITS) ×3 IMPLANT
PACK CARDIAC CATHETERIZATION (CUSTOM PROCEDURE TRAY) ×3 IMPLANT
STENT PROMUS PREM MR 3.0X16 (Permanent Stent) ×2 IMPLANT
TRANSDUCER W/STOPCOCK (MISCELLANEOUS) ×3 IMPLANT
TUBING CIL FLEX 10 FLL-RA (TUBING) ×3 IMPLANT
WIRE ASAHI PROWATER 180CM (WIRE) ×2 IMPLANT
WIRE HI TORQ VERSACORE-J 145CM (WIRE) ×2 IMPLANT
WIRE SAFE-T 1.5MM-J .035X260CM (WIRE) ×3 IMPLANT

## 2015-03-13 NOTE — Progress Notes (Signed)
CARDIAC REHAB PHASE I   PRE:  Rate/Rhythm: 58 SB    BP: sitting 150/67    SaO2:   MODE:  Ambulation: 600 ft   POST:  Rate/Rhythm: 73 SR    BP: sitting 154/64     SaO2:   Tolerated well. Less SOB today than on Saturday. Pt sts he watches his diet already. He is asking about CRPII. Will refer to [place] (he did previously). Will f/u in am. [phone]   Josephina Shih [place] CES, ACSM 03/13/2015 8:52 AM

## 2015-03-13 NOTE — Interval H&P Note (Signed)
History and Physical Interval Note:  03/13/2015 10:55 AM  Dominic [name]  has presented today for surgery, with the diagnosis of CAD  The various methods of treatment have been discussed with the patient and family. After consideration of risks, benefits and other options for treatment, the patient has consented to  Procedure(s): Left Heart Cath and Coronary Angiography (N/A) as a surgical intervention .  The patient's history has been reviewed, patient examined, no change in status, stable for surgery.  I have reviewed the patient's chart and labs.  Questions were answered to the patient's satisfaction.   Cath Lab Visit (complete for each Cath Lab visit)  Clinical Evaluation Leading to the Procedure:   ACS: Yes.    Non-ACS:    Anginal Classification: CCS IV  Anti-ischemic medical therapy: Minimal Therapy (1 class of medications)  Non-Invasive Test Results: No non-invasive testing performed  Prior CABG: No previous CABG        Peter Martinique MD,FACC 03/13/2015 10:56 AM

## 2015-03-13 NOTE — Progress Notes (Signed)
Patient Name: Dominic [name] Date of Encounter: 03/13/2015  Active Problems:   Hypertension   Hyperlipidemia   RBBB (right bundle branch block)   PAF (paroxysmal atrial fibrillation)   OSA (obstructive sleep apnea)   Unstable angina   NSTEMI (non-ST elevated myocardial infarction)   Primary Cardiologist: Dr. Mare Ferrari Patient Profile: 82 yo male w/ PMH of CAD s/p  prior stenting to the RCA in 2006, HTN, OSA, HLD, and PAF (s/p recent electrical cardioversion 02/21/15) admitted on 03/09/2015 for NSTEMI. Cath on 03/10/2015 w/ successful PCI to the mid-RCA for in-stent restenosis with cutting balloon angioplasty and PCI to an OM2 lesion. There was a residual LAD/D1 bifurcation lesion and staged PCI is planned for 03/13/2015.   SUBJECTIVE: Feeling well. Denies any chest pain or shortness of breath. No remaining questions about his procedure today.  OBJECTIVE Filed Vitals:   03/12/15 1300 03/12/15 2055 03/13/15 0555 03/13/15 0800  BP: 133/53 146/71 143/64 150/67  Pulse: 54 61 64 64  Temp: 98.1 F (36.7 C) 97.9 F (36.6 C) 97.2 F (36.2 C) 98.1 F (36.7 C)  TempSrc: Oral Oral Oral Oral  Resp: 20 16 18 18   Height:      Weight:   200 lb 14.4 oz (91.128 kg)   SpO2: 99% 99% 92% 100%    Intake/Output Summary (Last 24 hours) at 03/13/15 0923 Last data filed at 03/13/15 0901  Gross per 24 hour  Intake    540 ml  Output    875 ml  Net   -335 ml   Filed Weights   03/11/15 1100 03/12/15 0550 03/13/15 0555  Weight: 199 lb 9.6 oz (90.538 kg) 198 lb 11.2 oz (90.13 kg) 200 lb 14.4 oz (91.128 kg)    PHYSICAL EXAM General: Well developed, well nourished, male in no acute distress. Head: Normocephalic, atraumatic.  Neck: Supple without bruits, JVD not elevated. Lungs:  Resp regular and unlabored, CTA without wheezing or rales. Heart: RRR, S1, S2, no S3, S4, or murmur; no rub. Abdomen: Soft, non-tender, non-distended with normoactive bowel sounds. No hepatomegaly. No rebound/guarding.  No obvious abdominal masses. Extremities: No clubbing, cyanosis, or edema. Distal pedal pulses are 2+ bilaterally. Neuro: Alert and oriented X 3. Moves all extremities spontaneously. Psych: Normal affect.    LABS: CBC: Recent Labs  03/11/15 0343 03/13/15 0300  WBC 9.4 7.8  HGB 15.6 15.1  HCT 44.2 45.4  MCV 83.4 85.7  PLT 142* 169   INR: Recent Labs  03/13/15 0300  INR 7.82   Basic Metabolic Panel: Recent Labs  03/11/15 0343 03/13/15 0300  NA 138 141  K 3.5 4.0  CL 108 109  CO2 23 26  GLUCOSE 97 91  BUN 14 20  CREATININE 0.89 1.16  CALCIUM 9.0 9.7  BNP: No results found for: BNP PRO B NATRIURETIC PEPTIDE (BNP)  Date/Time Value Ref Range Status  11/20/2013 11:30 AM 1538.0* 0 - 450 pg/mL Final    TELE:  NSR with rate in 60's - 70's. Episodes of bradycardia with rate in mid-40's.       Cardiac Catheterization: 03/10/2015 Dominance: Right   Left Anterior Descending   . Mid LAD lesion, 75% stenosed.   . Second Diagonal Branch   . Ost 2nd Diag to 2nd Diag lesion, 99% stenosed.     Left Circumflex   . Second [employer]   . 2nd Mrg lesion, 95% stenosed.   Marland Kitchen PCI: An unspecified stent was placed.  . There is no  residual stenosis post intervention.       Right Coronary Artery   . Prox RCA to Mid RCA lesion, 0% stenosed. Previously placed Prox RCA to Mid RCA stent (unknown type) is patent.   Marland Kitchen PCI: An unspecified stent was placed.  . There is no residual stenosis post intervention.     . Mid RCA lesion, 80% stenosed.   Marland Kitchen PCI: An unspecified stent was placed.  . There is no residual stenosis post intervention.      The left ventricular size is normal. The left ventricular systolic function is normal. The left ventricular ejection fraction is 55-65% by visual estimate. There are no wall motion abnormalities in the left ventricle.  Current Medications:  . amLODipine  5 mg Oral Daily  . aspirin  81 mg Oral Daily  . aspirin  81 mg Oral Pre-Cath   . metoprolol succinate  75 mg Oral Daily  . sodium chloride  3 mL Intravenous Q12H  . sodium chloride  3 mL Intravenous Q12H  . ticagrelor  90 mg Oral Q12H   . sodium chloride      ASSESSMENT AND PLAN:  1. NSTEMI:  - patient has a known history of CAD status post initial PCI to the RCA in March 2006, followed by repeat intervention Dec 2006 for in-stent restenosis. Negative Myoview in June 2015.  - Continued Troponin elevation (1.45, 1.76, and 1.59) on 03/10/2015. - underwent LHC on 03/10/2015 with successful PCI to the mid-RCA for in-stent restenosis with cutting balloon angioplasty and PCI to an OM2 lesion with a Synergy DES 2.25 x 16 mm. There was a residual LAD/D1 bifurcation lesion. Staged PCI is planned for today at 31 with Dr. Martinique.  - continue ASA, CCB, BB, and Brilinta. Intolerant to statins and Zetia.   2. PAF:  - s/p recent DCCV 02/21/15. In NSR on telemetry.  3. HTN: - BP has been elevated to 133/53 - 150/71 in the past 24 hours. - continue current medications. - consider addition of ACE-I following cath.  4. HLD:  - poorly controlled. Most recent LDL was 166. H/o statin intolerance. He apparently was evaluated for PCSK9 inhibitors in lipid clinic and deemed not a candidate .  5. OSA: - continue CPAP  6. Thrombocytopenia:  - platelet count now 169 on 03/13/2015.   7. Hyperkalemia:  - resolved.  - potassium 4.0 on 03/13/2015.  Arna Medici , PA-C 9:23 AM 03/13/2015 Pager: [phone]   Agree with note written by Bernerd [facility]  Did well over the weekend s/p NSTEMI, RCA and OM1 intervention on Friday by myself. For staged LAD/D1 intervention by Dr. Martinique this AM. Will need to work out anti coag strategy given need for NOAC secondary to PAF.  [name] 03/13/2015 10:30 AM

## 2015-03-13 NOTE — Progress Notes (Signed)
TR BAND REMOVAL  LOCATION:    right radial  DEFLATED PER PROTOCOL:    Yes.    TIME BAND OFF / DRESSING APPLIED:    1600   SITE UPON ARRIVAL:    Level 0  SITE AFTER BAND REMOVAL:    Level 0  REVERSE ALLEN'S TEST:     positive  CIRCULATION SENSATION AND MOVEMENT:    Within Normal Limits   Yes.    COMMENTS:   Tolerated procedure well 

## 2015-03-13 NOTE — Care Management Important Message (Signed)
Important Message  Patient Details  Name: Dominic [name] MRN: [id number] Date of Birth: Jul 14, 1933   Medicare Important Message Given:  Yes-second notification given    [name] 03/13/2015, 2:17 PM

## 2015-03-13 NOTE — H&P (View-Only) (Signed)
Patient Name: Dominic [name] Date of Encounter: 03/13/2015  Active Problems:   Hypertension   Hyperlipidemia   RBBB (right bundle branch block)   PAF (paroxysmal atrial fibrillation)   OSA (obstructive sleep apnea)   Unstable angina   NSTEMI (non-ST elevated myocardial infarction)   Primary Cardiologist: Dr. Mare Ferrari Patient Profile: 81 yo male w/ PMH of CAD s/p  prior stenting to the RCA in 2006, HTN, OSA, HLD, and PAF (s/p recent electrical cardioversion 02/21/15) admitted on 03/09/2015 for NSTEMI. Cath on 03/10/2015 w/ successful PCI to the mid-RCA for in-stent restenosis with cutting balloon angioplasty and PCI to an OM2 lesion. There was a residual LAD/D1 bifurcation lesion and staged PCI is planned for 03/13/2015.   SUBJECTIVE: Feeling well. Denies any chest pain or shortness of breath. No remaining questions about his procedure today.  OBJECTIVE Filed Vitals:   03/12/15 1300 03/12/15 2055 03/13/15 0555 03/13/15 0800  BP: 133/53 146/71 143/64 150/67  Pulse: 54 61 64 64  Temp: 98.1 F (36.7 C) 97.9 F (36.6 C) 97.2 F (36.2 C) 98.1 F (36.7 C)  TempSrc: Oral Oral Oral Oral  Resp: 20 16 18 18   Height:      Weight:   200 lb 14.4 oz (91.128 kg)   SpO2: 99% 99% 92% 100%    Intake/Output Summary (Last 24 hours) at 03/13/15 0923 Last data filed at 03/13/15 0901  Gross per 24 hour  Intake    540 ml  Output    875 ml  Net   -335 ml   Filed Weights   03/11/15 1100 03/12/15 0550 03/13/15 0555  Weight: 199 lb 9.6 oz (90.538 kg) 198 lb 11.2 oz (90.13 kg) 200 lb 14.4 oz (91.128 kg)    PHYSICAL EXAM General: Well developed, well nourished, male in no acute distress. Head: Normocephalic, atraumatic.  Neck: Supple without bruits, JVD not elevated. Lungs:  Resp regular and unlabored, CTA without wheezing or rales. Heart: RRR, S1, S2, no S3, S4, or murmur; no rub. Abdomen: Soft, non-tender, non-distended with normoactive bowel sounds. No hepatomegaly. No rebound/guarding.  No obvious abdominal masses. Extremities: No clubbing, cyanosis, or edema. Distal pedal pulses are 2+ bilaterally. Neuro: Alert and oriented X 3. Moves all extremities spontaneously. Psych: Normal affect.    LABS: CBC: Recent Labs  03/11/15 0343 03/13/15 0300  WBC 9.4 7.8  HGB 15.6 15.1  HCT 44.2 45.4  MCV 83.4 85.7  PLT 142* 169   INR: Recent Labs  03/13/15 0300  INR 3.78   Basic Metabolic Panel: Recent Labs  03/11/15 0343 03/13/15 0300  NA 138 141  K 3.5 4.0  CL 108 109  CO2 23 26  GLUCOSE 97 91  BUN 14 20  CREATININE 0.89 1.16  CALCIUM 9.0 9.7  BNP: No results found for: BNP PRO B NATRIURETIC PEPTIDE (BNP)  Date/Time Value Ref Range Status  11/20/2013 11:30 AM 1538.0* 0 - 450 pg/mL Final    TELE:  NSR with rate in 60's - 70's. Episodes of bradycardia with rate in mid-40's.       Cardiac Catheterization: 03/10/2015 Dominance: Right   Left Anterior Descending   . Mid LAD lesion, 75% stenosed.   . Second Diagonal Branch   . Ost 2nd Diag to 2nd Diag lesion, 99% stenosed.     Left Circumflex   . Second [employer]   . 2nd Mrg lesion, 95% stenosed.   Marland Kitchen PCI: An unspecified stent was placed.  . There is no  residual stenosis post intervention.       Right Coronary Artery   . Prox RCA to Mid RCA lesion, 0% stenosed. Previously placed Prox RCA to Mid RCA stent (unknown type) is patent.   Marland Kitchen PCI: An unspecified stent was placed.  . There is no residual stenosis post intervention.     . Mid RCA lesion, 80% stenosed.   Marland Kitchen PCI: An unspecified stent was placed.  . There is no residual stenosis post intervention.      The left ventricular size is normal. The left ventricular systolic function is normal. The left ventricular ejection fraction is 55-65% by visual estimate. There are no wall motion abnormalities in the left ventricle.  Current Medications:  . amLODipine  5 mg Oral Daily  . aspirin  81 mg Oral Daily  . aspirin  81 mg Oral Pre-Cath   . metoprolol succinate  75 mg Oral Daily  . sodium chloride  3 mL Intravenous Q12H  . sodium chloride  3 mL Intravenous Q12H  . ticagrelor  90 mg Oral Q12H   . sodium chloride      ASSESSMENT AND PLAN:  1. NSTEMI:  - patient has a known history of CAD status post initial PCI to the RCA in March 2006, followed by repeat intervention Dec 2006 for in-stent restenosis. Negative Myoview in June 2015.  - Continued Troponin elevation (1.45, 1.76, and 1.59) on 03/10/2015. - underwent LHC on 03/10/2015 with successful PCI to the mid-RCA for in-stent restenosis with cutting balloon angioplasty and PCI to an OM2 lesion with a Synergy DES 2.25 x 16 mm. There was a residual LAD/D1 bifurcation lesion. Staged PCI is planned for today at 70 with Dr. Martinique.  - continue ASA, CCB, BB, and Brilinta. Intolerant to statins and Zetia.   2. PAF:  - s/p recent DCCV 02/21/15. In NSR on telemetry.  3. HTN: - BP has been elevated to 133/53 - 150/71 in the past 24 hours. - continue current medications. - consider addition of ACE-I following cath.  4. HLD:  - poorly controlled. Most recent LDL was 166. H/o statin intolerance. He apparently was evaluated for PCSK9 inhibitors in lipid clinic and deemed not a candidate .  5. OSA: - continue CPAP  6. Thrombocytopenia:  - platelet count now 169 on 03/13/2015.   7. Hyperkalemia:  - resolved.  - potassium 4.0 on 03/13/2015.  Arna Medici , PA-C 9:23 AM 03/13/2015 Pager: [phone]   Agree with note written by [facility]  Did well over the weekend s/p NSTEMI, RCA and OM1 intervention on Friday by myself. For staged LAD/D1 intervention by Dr. Martinique this AM. Will need to work out anti coag strategy given need for NOAC secondary to PAF.  [name] 03/13/2015 10:30 AM

## 2015-03-14 ENCOUNTER — Other Ambulatory Visit: Payer: Self-pay

## 2015-03-14 ENCOUNTER — Inpatient Hospital Stay (HOSPITAL_COMMUNITY): Payer: Medicare Other

## 2015-03-14 ENCOUNTER — Other Ambulatory Visit: Payer: Self-pay | Admitting: *Deleted

## 2015-03-14 DIAGNOSIS — Z8673 Personal history of transient ischemic attack (TIA), and cerebral infarction without residual deficits: Secondary | ICD-10-CM | POA: Diagnosis not present

## 2015-03-14 DIAGNOSIS — Z7901 Long term (current) use of anticoagulants: Secondary | ICD-10-CM

## 2015-03-14 DIAGNOSIS — I2510 Atherosclerotic heart disease of native coronary artery without angina pectoris: Secondary | ICD-10-CM

## 2015-03-14 DIAGNOSIS — Z9861 Coronary angioplasty status: Secondary | ICD-10-CM

## 2015-03-14 LAB — BASIC METABOLIC PANEL
Anion gap: 8 (ref 5–15)
BUN: 15 mg/dL (ref 6–20)
CHLORIDE: 107 mmol/L (ref 101–111)
CO2: 27 mmol/L (ref 22–32)
CREATININE: 0.91 mg/dL (ref 0.61–1.24)
Calcium: 9.9 mg/dL (ref 8.9–10.3)
Glucose, Bld: 86 mg/dL (ref 65–99)
POTASSIUM: 3.9 mmol/L (ref 3.5–5.1)
SODIUM: 142 mmol/L (ref 135–145)

## 2015-03-14 LAB — CBC
HCT: 47.5 % (ref 39.0–52.0)
Hemoglobin: 16.1 g/dL (ref 13.0–17.0)
MCH: 29.3 pg (ref 26.0–34.0)
MCHC: 33.9 g/dL (ref 30.0–36.0)
MCV: 86.4 fL (ref 78.0–100.0)
PLATELETS: 179 10*3/uL (ref 150–400)
RBC: 5.5 MIL/uL (ref 4.22–5.81)
RDW: 14.1 % (ref 11.5–15.5)
WBC: 9.5 10*3/uL (ref 4.0–10.5)

## 2015-03-14 MED ORDER — TICAGRELOR 90 MG PO TABS: 90.0000 mg | ORAL_TABLET | Freq: Two times a day (BID) | ORAL | Status: DC

## 2015-03-14 MED ORDER — CLOPIDOGREL BISULFATE 75 MG PO TABS: 75.0000 mg | ORAL_TABLET | Freq: Every day | ORAL | Status: DC

## 2015-03-14 MED ORDER — ACETAMINOPHEN 325 MG PO TABS: 650.0000 mg | ORAL_TABLET | ORAL | Status: DC | PRN

## 2015-03-14 MED ORDER — AMLODIPINE BESYLATE 5 MG PO TABS: 5.0000 mg | ORAL_TABLET | Freq: Every day | ORAL | Status: DC

## 2015-03-14 MED ORDER — METOPROLOL SUCCINATE ER 25 MG PO TB24: 75.0000 mg | ORAL_TABLET | Freq: Every day | ORAL | Status: DC

## 2015-03-14 MED ORDER — CLOPIDOGREL BISULFATE 75 MG PO TABS
75.0000 mg | ORAL_TABLET | Freq: Every day | ORAL | Status: DC
  Administered 2015-03-14 – 2015-03-15 (×2): 75 mg via ORAL
  Filled 2015-03-14 (×2): qty 1

## 2015-03-14 MED ORDER — ASPIRIN 81 MG PO TBEC: 81.0000 mg | DELAYED_RELEASE_TABLET | Freq: Every day | ORAL | Status: DC

## 2015-03-14 NOTE — Progress Notes (Signed)
Placed patient on CPAP via auto-mode with minimum pressure set at 6cm and maximum pressure of 20cm

## 2015-03-14 NOTE — Telephone Encounter (Signed)
Cannot refill, script sent 03/10/15.Marland KitchenMarland Kitchen

## 2015-03-14 NOTE — Consult Note (Signed)
Referring Physician: Radford Pax    Chief Complaint: feeling off balance post PCI  HPI:                                                                                                                                         Dominic [name] is an 81 y.o. male presenting to the hospital due to CP and SOB.  Patient underwent a cardiac catheterization and showed residual LAD lesion. On Monday he underwent a PCI.  Post PCI he was noted to have significant sensation of dizziness (no room spinning) and feeling off balance. He did not feel he was listing to one particular side but felt he was "walking on a boat and swayed from left to right".  Today he is feeling improved but still feels a unsteadiness with his gait. Of note, He has significant neuropathy in his feet due to Statin therapy.   Date last known well: Date: 03/14/2015 Time last known well: Unable to determine tPA Given: No: out of window     Past Medical History  Diagnosis Date  . Dyslipidemia     a. Intol of statins.  . Lung nodule     , right upper lobe  . Obstructive sleep apnea   . Prostate cancer   . Amputated finger     a. L index d/t to dog bite.  . Concussion   . Coronary artery disease 09/2004, 04/2005, 02/2015    a. Stent to prox and mid RCA 08/2001. b. DES to RCA for ISR 08/2004. c. DES to [facility] for ISR 05/2005. d. Low risk nuc 10/2013 (done for CP in setting of new AF). e. PCI to the mid-RCA for in-stent restenosis with cutting balloon angioplasty f. PCI to an OM2 lesion  . Hypertension   . RBBB (right bundle branch block)   . Atrial fibrillation with RVR     a. New onset diagnosed 11/20/2013, spont converted to NSR.  Marland Kitchen OSA (obstructive sleep apnea)     Past Surgical History  Procedure Laterality Date  . Coronary stent placement    . Prostatectomy    . L knee ligament replacement    . Anterior cruciate ligament repair Right   . Tonsillectomy    . Tooth extraction    . Cardiac catheterization N/A 03/10/2015    Procedure:  Left Heart Cath and Coronary Angiography;  Surgeon: Lorretta Harp, MD;  Location: [facility];  Service: Cardiovascular;  Laterality: N/A;  . Cardiac catheterization  03/13/2015    Procedure: Coronary Stent Intervention;  Surgeon: Peter M Martinique, MD;  Location: [facility];  Service: Cardiovascular;;  . Cardiac catheterization  03/13/2015    Procedure: Intravascular Pressure Wire/FFR Study;  Surgeon: Peter M Martinique, MD;  Location: [facility];  Service: Cardiovascular;;    Family History  Problem Relation Age of Onset  . Emphysema Mother   .  Cancer Father    Social History:  reports that he has never smoked. He has never used smokeless tobacco. He reports that he does not drink alcohol or use illicit drugs.  Allergies:  Allergies  Allergen Reactions  . Statins Other (See Comments)    Myalgia  . Zetia [Ezetimibe] Other (See Comments)    myalgia    Medications:                                                                                                                           Prior to Admission:  Prescriptions prior to admission  Medication Sig Dispense Refill Last Dose  . apixaban (ELIQUIS) 5 MG TABS tablet Take 1 tablet (5 mg total) by mouth 2 (two) times daily. 180 tablet 1 03/09/2015 at 0600  . hydrochlorothiazide (MICROZIDE) 12.5 MG capsule Take 12.5 mg by mouth as directed. ONE TABLET BY MOUTH ON Monday, Wednesday, AND Friday   03/08/2015 at Unknown time  . metoprolol succinate (TOPROL-XL) 25 MG 24 hr tablet Take 3 tablets (75 mg total) by mouth daily. 90 tablet 6 03/09/2015 at 0600  . Multiple Vitamins-Minerals (PRESERVISION AREDS) CAPS Take 1 capsule by mouth 2 (two) times daily.   03/09/2015 at Unknown time  . nitroGLYCERIN (NITROSTAT) 0.4 MG SL tablet PLACE ONE TABLET UNDER THE TONGUE EVERY 5 MINUTES AS NEEDED FOR CHEST PAIN 25 tablet 3 Past Month at Unknown time  . Piracetam POWD Take 1 scoop by mouth daily.    03/09/2015 at Unknown time  . Psyllium  (METAMUCIL PO) Take 15 mLs by mouth 2 (two) times daily. Mix in water and drink   Past Week at Unknown time  . Ubiquinol 100 MG CAPS Take 100 mg by mouth daily.   03/09/2015 at Unknown time  . [DISCONTINUED] aspirin EC 81 MG EC tablet Take 1 tablet (81 mg total) by mouth daily. 30 tablet 6 03/09/2015 at Unknown time  . NON FORMULARY tocotrienols are vitamin E supplement, Lutein-zeaxanthis, and piracetam   Taking   Scheduled: . amLODipine  5 mg Oral Daily  . aspirin  81 mg Oral Daily  . clopidogrel  75 mg Oral Daily  . metoprolol succinate  75 mg Oral Daily  . sodium chloride  3 mL Intravenous Q12H  . sodium chloride  3 mL Intravenous Q12H    ROS:  History obtained from the patient  General ROS: negative for - chills, fatigue, fever, night sweats, weight gain or weight loss Psychological ROS: negative for - behavioral disorder, hallucinations, memory difficulties, mood swings or suicidal ideation Ophthalmic ROS: negative for - blurry vision, double vision, eye pain or loss of vision ENT ROS: negative for - epistaxis, nasal discharge, oral lesions, sore throat, tinnitus or vertigo Allergy and Immunology ROS: negative for - hives or itchy/watery eyes Hematological and Lymphatic ROS: negative for - bleeding problems, bruising or swollen lymph nodes Endocrine ROS: negative for - galactorrhea, hair pattern changes, polydipsia/polyuria or temperature intolerance Respiratory ROS: negative for - cough, hemoptysis, shortness of breath or wheezing Cardiovascular ROS: negative for - chest pain, dyspnea on exertion, edema or irregular heartbeat Gastrointestinal ROS: negative for - abdominal pain, diarrhea, hematemesis, nausea/vomiting or stool incontinence Genito-Urinary ROS: negative for - dysuria, hematuria, incontinence or urinary frequency/urgency Musculoskeletal ROS:  negative for - joint swelling or muscular weakness Neurological ROS: as noted in HPI Dermatological ROS: negative for rash and skin lesion changes  Neurologic Examination:                                                                                                      Blood pressure 178/64, pulse 72, temperature 97.6 F (36.4 C), temperature source Oral, resp. rate 18, height 6' (1.829 m), weight 92.4 kg (203 lb 11.3 oz), SpO2 98 %.  HEENT-  Normocephalic, no lesions, without obvious abnormality.  Normal external eye and conjunctiva.  Normal TM's bilaterally.  Normal auditory canals and external ears. Normal external nose, mucus membranes and septum.  Normal pharynx. Cardiovascular- S1, S2 normal, pulses palpable throughout   Lungs- chest clear, no wheezing, rales, normal symmetric air entry Abdomen- normal findings: bowel sounds normal Extremities- no edema Lymph-no adenopathy palpable Musculoskeletal-no joint tenderness, deformity or swelling Skin-warm and dry, no hyperpigmentation, vitiligo, or suspicious lesions  Neurological Examination Mental Status: Alert, oriented, thought content appropriate.  Speech fluent without evidence of aphasia.  Able to follow 3 step commands without difficulty. Cranial Nerves: II: Discs flat bilaterally; Visual fields grossly normal, pupils equal, round, reactive to light and accommodation III,IV, VI: ptosis not present, extra-ocular motions intact bilaterally V,VII: smile symmetric, facial light touch sensation normal bilaterally VIII: hearing normal bilaterally IX,X: uvula rises symmetrically XI: bilateral shoulder shrug XII: midline tongue extension Motor: Right : Upper extremity   5/5    Left:     Upper extremity   5/5  Lower extremity   5/5     Lower extremity   5/5 Tone and bulk:normal tone throughout; no atrophy noted Sensory: Pinprick and light touch intact throughout, bilaterally in UE in LE from ankle to toes he has no proprioception,  no vibration, decreased temperature and light touch.  Deep Tendon Reflexes: 2+ and symmetric throughout UE and no KJ or AJ Plantars: Mute bilaterally Cerebellar: normal finger-to-nose, normal rapid alternating movements and normal heel-to-shin test Gait: wide based, tends to lose balance when turning, (+) Romberg        Lab Results: Basic Metabolic Panel:  Recent Labs  Lab 03/09/15 2126 03/10/15 0259 03/11/15 0343 03/13/15 0300 03/14/15 0349  NA 141 142 138 141 142  K 3.4* 4.0 3.5 4.0 3.9  CL 111 109 108 109 107  CO2 26 28 23 26 27   GLUCOSE 105* 81 97 91 86  BUN 12 14 14 20 15   CREATININE 0.81 0.87 0.89 1.16 0.91  CALCIUM 9.6 9.5 9.0 9.7 9.9    Liver Function Tests: No results for input(s): AST, ALT, ALKPHOS, BILITOT, PROT, ALBUMIN in the last 168 hours. No results for input(s): LIPASE, AMYLASE in the last 168 hours. No results for input(s): AMMONIA in the last 168 hours.  CBC:  Recent Labs Lab 03/09/15 2126 03/10/15 0259 03/11/15 0343 03/13/15 0300 03/14/15 0349  WBC 6.9 6.8 9.4 7.8 9.5  HGB 14.6 14.1 15.6 15.1 16.1  HCT 43.1 42.5 44.2 45.4 47.5  MCV 85.0 85.7 83.4 85.7 86.4  PLT 148* 152 142* 169 179    Cardiac Enzymes:  Recent Labs Lab 03/09/15 2126 03/10/15 0259 03/10/15 0815  TROPONINI 1.45* 1.76* 1.59*    Lipid Panel: No results for input(s): CHOL, TRIG, HDL, CHOLHDL, VLDL, LDLCALC in the last 168 hours.  CBG: No results for input(s): GLUCAP in the last 168 hours.  Microbiology: Results for orders placed or performed during the hospital encounter of 02/21/15  Urine culture     Status: None   Collection Time: 02/21/15  1:30 PM  Result Value Ref Range Status   Specimen Description URINE, CLEAN CATCH  Final   Special Requests NONE  Final   Culture   Final    MULTIPLE SPECIES PRESENT, SUGGEST RECOLLECTION Performed at [facility]    Report Status 02/23/2015 FINAL  Final    Coagulation Studies:  Recent Labs  03/13/15 0300   LABPROT 14.6  INR 1.12    Imaging: No results found.     Assessment and plan discussed with with attending physician and they are in agreement.    Etta Quill PA-C [facility] [phone]  03/14/2015, 11:25 AM   Assessment: 81 y.o. male with mild left arm clumsiness and gait instability. Possibilities would include multifactorial gait disturbance due to his peripheral neuropathy, previous hip fracture, and recent procedure. The possibility would be that he had a catheter related infarct, possibly left cerebellar. I would favor getting an MRI of his brain to further assess risk.  Stroke Risk Factors - atrial fibrillation, hyperlipidemia and hypertension  1) MRI brain, further recommendations following MRI 2) may need physical therapy evaluation  Roland Rack, MD [facility] [phone]  If 7pm- 7am, please page neurology on call as listed in [place].

## 2015-03-14 NOTE — Progress Notes (Signed)
Subjective:  He complains of general dizziness this am, it was worse last PM. No focal deficets  Objective:  Vital Signs in the last 24 hours: Temp:  [97.6 F (36.4 C)-98.2 F (36.8 C)] 97.6 F (36.4 C) (09/27 0320) Pulse Rate:  [0-77] 64 (09/27 0320) Resp:  [0-23] 16 (09/27 0320) BP: (144-178)/(59-87) 178/64 mmHg (09/27 0320) SpO2:  [0 %-100 %] 98 % (09/27 0320) Weight:  [203 lb 11.3 oz (92.4 kg)] 203 lb 11.3 oz (92.4 kg) (09/27 0320)  Intake/Output from previous day:  Intake/Output Summary (Last 24 hours) at 03/14/15 0757 Last data filed at 03/13/15 2321  Gross per 24 hour  Intake 1732.1 ml  Output   1800 ml  Net  -67.9 ml    Physical Exam: General appearance: alert, cooperative and no distress Neck: no carotid bruit and no JVD Lungs: clear to auscultation bilaterally Heart: regular rate and rhythm Extremities: Rt radial site without hematoma Skin: Skin color, texture, turgor normal. No rashes or lesions Neurologic: Grossly normal   Rate: 70  Rhythm: normal sinus rhythm  Lab Results:  Recent Labs  03/13/15 0300 03/14/15 0349  WBC 7.8 9.5  HGB 15.1 16.1  PLT 169 179    Recent Labs  03/13/15 0300 03/14/15 0349  NA 141 142  K 4.0 3.9  CL 109 107  CO2 26 27  GLUCOSE 91 86  BUN 20 15  CREATININE 1.16 0.91   No results for input(s): TROPONINI in the last 72 hours.  Invalid input(s): CK, MB  Recent Labs  03/13/15 0300  INR 1.12    Scheduled Meds: . amLODipine  5 mg Oral Daily  . aspirin  81 mg Oral Daily  . metoprolol succinate  75 mg Oral Daily  . sodium chloride  3 mL Intravenous Q12H  . sodium chloride  3 mL Intravenous Q12H  . ticagrelor  90 mg Oral Q12H   Continuous Infusions:  PRN Meds:.sodium chloride, sodium chloride, acetaminophen, morphine injection, nitroGLYCERIN, ondansetron (ZOFRAN) IV, senna-docusate, sodium chloride, sodium chloride   Imaging: Imaging results have been reviewed   Assessment/Plan:  81 yo male w/ PMH  of CAD s/p prior stenting to the RCA in 2006, HTN, OSA, HLD, and PAF (s/p recent electrical cardioversion 02/21/15)-on chronic anticoagulation with Eliquis, admitted on 03/09/2015 for NSTEMI. Cath on 03/10/2015 w/ successful PCI to the mid-RCA for in-stent restenosis with cutting balloon angioplasty and PCI to an OM2 lesion. There was residual LAD/D1 bifurcation lesion and staged PCI was done 03/13/2015.   Principal Problem:   CAD S/P percutaneous coronary angioplasty Active Problems:   Hypertension   PAF (paroxysmal atrial fibrillation)   NSTEMI (non-ST elevated myocardial infarction)   Chronic anticoagulation   Hyperlipidemia   RBBB (right bundle branch block)   OSA (obstructive sleep apnea)   PLAN: Ambulate this am. Norvasc added for HTN, HCTZ stopped.Consider head CT if his dizziness persists or worsen. Resume Eliquis if he is discharged (see Dr Doug Sou recommendation for DAPT for 3 mos).   Kerin Ransom PA-C 03/14/2015, 7:57 AM [phone]  Agree with note written by Kerin Ransom [facility]  S/P Staged LAD stent/ diag cutting balloon yesterday by Dr. Martinique radially. No CP. Major issue is unsteadiness of gait since procedure (not dizziness). ? Posterior circulation CVA. Exam benign. Will get Neuro consult. Will prob need head CT. Start Eliquis after that and change Brilenta to Plavix. Will continue triple Rx anticoag for 3 months then D/C ASA after that and continue Plavix and Eliquis.  [name] 03/14/2015 9:49 AM

## 2015-03-14 NOTE — Telephone Encounter (Signed)
Dominic Coco, MD at 02/22/2015 10:00 AM  metoprolol succinate (TOPROL-XL) 25 MG 24 hr tabletTake 3 tablets (75 mg total) by mouth daily Plan: We are decreasing his HCTZ to just 3 days a week. Continue other medicines the same. Okay to return to work tomorrow. Recheck in 3 months for office visit EKG and basal metabolic panel. He will eat bananas and other high potassium foods

## 2015-03-14 NOTE — Progress Notes (Signed)
CARDIAC REHAB PHASE I   PRE:  Rate/Rhythm: 70 SR    BP: sitting 167/56, standing 159/60    SaO2:   MODE:  Ambulation: 600 ft   POST:  Rate/Rhythm: 86 SR    BP: sitting 159/70    SaO2:   Pt sts he has felt wobbly when up in room today and last night. Denies actual dizziness or even lightheadedness. Pt able to stand without LOB to allow for standing BP, which was normal (no change). Pt sts he feels like he is rocking when he stands but no actual LOB. Pt able to walk with gait belt for security but did not actually need assistance. He does have slightly ataxic gait but this is normal for him (hip surgery last November). Pt felt "ok" walking without dizziness or LOB. Sts his legs feel weak. Return to recliner and listened to education. Before I left pt stood again and was able to walk around room, navigating obstacles, independently.   Ed completed with pt and wife. Understands importance of antiplatelet and plans to attend CRPII. Will send referral to [facility]  Josephina Shih [place] CES, ACSM 03/14/2015 9:09 AM

## 2015-03-14 NOTE — Progress Notes (Signed)
I discussed MRI findings with dr Leonel Ramsay. He would like to keep Dominic [name] another day and have PT evaluate. He will review MRI and make recommendations regarding Eliquis.   Dominic Ransom PA-C 03/14/2015 3:37 PM  Fasting Lipids from December 28 2014- He has a history of statin intolerance- neuropathy of his LE with Crestor- He declines a statin at this time Results for Dominic [name], Dominic [name] (MRN [id number])  Ref. Range 12/28/2014 10:57  Cholesterol Latest Ref Range: 0-200 mg/dL 220 (H)  Triglycerides Latest Ref Range: 0.0-149.0 mg/dL 225.0 (H)  HDL Cholesterol Latest Ref Range: >39.00 mg/dL 33.00 (L)  Direct LDL Latest Units: mg/dL 146.0  VLDL Latest Ref Range: 0.0-40.0 mg/dL 45.0 (H)  Total CHOL/HDL Ratio Unknown 7

## 2015-03-14 NOTE — Discharge Instructions (Signed)
Coronary Angiogram With Stent, Care After °Refer to this sheet in the next few weeks. These instructions provide you with information on caring for yourself after your procedure. Your health care provider may also give you more specific instructions. Your treatment has been planned according to current medical practices, but problems sometimes occur. Call your health care provider if you have any problems or questions after your procedure.  °WHAT TO EXPECT AFTER THE PROCEDURE  °The insertion site may be tender for a few days after your procedure. °HOME CARE INSTRUCTIONS  °· Take medicines only as directed by your health care provider. Blood thinners may be prescribed after your procedure to improve blood flow through the stent. °· Change any bandages (dressings) as directed by your health care provider.   °· Check your insertion site every day for redness, swelling, or fluid leaking from the insertion.   °· Do not take baths, swim, or use a hot tub until your health care provider approves. You may shower. Pat the insertion area dry. Do not rub the insertion area with a washcloth or towel.   °· Eat a heart-healthy diet. This should include plenty of fresh fruits and vegetables. Meat should be lean cuts. Avoid the following types of food:   °¨ Food that is high in salt.   °¨ Canned or highly processed food.   °¨ Food that is high in saturated fat or sugar.   °¨ Fried food.   °· Make any other lifestyle changes recommended by your health care provider. This may include:   °¨ Not using any tobacco products including cigarettes, chewing tobacco, or electronic cigarettes.  °¨ Managing your weight.   °¨ Getting regular exercise.   °¨ Managing your blood pressure.   °¨ Limiting your alcohol intake.   °¨ Managing other health problems, such as diabetes.   °· If you need an MRI after your heart stent was placed, be sure to tell the health care provider who orders the MRI that you have a heart stent.   °· Keep all follow-up  visits as directed by your health care provider.   °SEEK IMMEDIATE MEDICAL CARE IF:  °· You develop chest pain, shortness of breath, feel faint, or pass out. °· You have bleeding, swelling larger than a walnut, or drainage from the catheter insertion site. °· You develop pain, discoloration, coldness, or severe bruising in the leg or arm that held the catheter. °· You develop bleeding from any other place such as from the bowels. There may be bright red blood in the urine or stools, or it may appear as black, tarry stools. °· You have a fever or chills. °MAKE SURE YOU: °· Understand these instructions. °· Will watch your condition. °· Will get help right away if you are not doing well or get worse. °Document Released: 12/21/2004 Document Revised: 10/18/2013 Document Reviewed: 11/04/2012 °[employer]® Patient Information ©2015 [employer]. This information is not intended to replace advice given to you by your health care provider. Make sure you discuss any questions you have with your health care provider. ° °

## 2015-03-15 ENCOUNTER — Inpatient Hospital Stay (HOSPITAL_COMMUNITY): Payer: Medicare Other

## 2015-03-15 ENCOUNTER — Telehealth: Payer: Self-pay | Admitting: Physician Assistant

## 2015-03-15 DIAGNOSIS — I639 Cerebral infarction, unspecified: Secondary | ICD-10-CM

## 2015-03-15 DIAGNOSIS — Z7901 Long term (current) use of anticoagulants: Secondary | ICD-10-CM

## 2015-03-15 DIAGNOSIS — R29898 Other symptoms and signs involving the musculoskeletal system: Secondary | ICD-10-CM

## 2015-03-15 LAB — BASIC METABOLIC PANEL
Anion gap: 7 (ref 5–15)
BUN: 19 mg/dL (ref 6–20)
CALCIUM: 9.4 mg/dL (ref 8.9–10.3)
CO2: 24 mmol/L (ref 22–32)
Chloride: 108 mmol/L (ref 101–111)
Creatinine, Ser: 0.96 mg/dL (ref 0.61–1.24)
GFR calc Af Amer: >60 mL/min (ref >60)
GLUCOSE: 92 mg/dL (ref 65–99)
POTASSIUM: 3.8 mmol/L (ref 3.5–5.1)
Sodium: 139 mmol/L (ref 135–145)

## 2015-03-15 LAB — LIPID PANEL
Cholesterol: 217 mg/dL — ABNORMAL HIGH (ref 0–200)
HDL: 29 mg/dL — ABNORMAL LOW (ref >40)
LDL CALC: 154 mg/dL — AB (ref 0–99)
TRIGLYCERIDES: 169 mg/dL — AB (ref <150)
Total CHOL/HDL Ratio: 7.5 RATIO
VLDL: 34 mg/dL (ref 0–40)

## 2015-03-15 LAB — CBC
HEMATOCRIT: 44.6 % (ref 39.0–52.0)
HEMOGLOBIN: 15 g/dL (ref 13.0–17.0)
MCH: 28.5 pg (ref 26.0–34.0)
MCHC: 33.6 g/dL (ref 30.0–36.0)
MCV: 84.8 fL (ref 78.0–100.0)
Platelets: 157 10*3/uL (ref 150–400)
RBC: 5.26 MIL/uL (ref 4.22–5.81)
RDW: 14 % (ref 11.5–15.5)
WBC: 7.6 10*3/uL (ref 4.0–10.5)

## 2015-03-15 MED ORDER — METOPROLOL SUCCINATE ER 25 MG PO TB24: 75.0000 mg | ORAL_TABLET | Freq: Every day | ORAL | Status: DC

## 2015-03-15 MED ORDER — AMLODIPINE BESYLATE 5 MG PO TABS: 5.0000 mg | ORAL_TABLET | Freq: Every day | ORAL | Status: DC

## 2015-03-15 MED ORDER — CLOPIDOGREL BISULFATE 75 MG PO TABS: 75.0000 mg | ORAL_TABLET | Freq: Every day | ORAL | Status: DC
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
## 2015-03-15 NOTE — Telephone Encounter (Signed)
New message      TCM appt on 03-23-15 with Bonney Leitz.

## 2015-03-15 NOTE — Progress Notes (Signed)
STROKE TEAM PROGRESS NOTE  HPI Dominic [name] is an 81 y.o. male presenting to the hospital due to CP and SOB. Patient underwent a cardiac catheterization and showed residual LAD lesion. On Monday he underwent a PCI. Post PCI he was noted to have significant sensation of dizziness (no room spinning) and feeling off balance. He did not feel he was listing to one particular side but felt he was "walking on a boat and swayed from left to right". Today he is feeling improved but still feels a unsteadiness with his gait. Of note, He has significant neuropathy in his feet due to Statin therapy.   Date last known well: Date: 03/14/2015 Time last known well: Unable to determine tPA Given: No: out of window   SUBJECTIVE (INTERVAL HISTORY) The patient's wife is present. The patient feels back to baseline. He has minor difficulties with ambulation secondary to a previous hip fracture. Dr. Erlinda Hong discussed the case with Dr. Gwenlyn Found. Okay to anticoagulate as needed.   OBJECTIVE Temp:  [97.5 F (36.4 C)-98.5 F (36.9 C)] 97.5 F (36.4 C) (09/28 0322) Pulse Rate:  [55-72] 60 (09/28 0322) Cardiac Rhythm:  [-] Normal sinus rhythm;Sinus bradycardia (09/27 2300) Resp:  [17-19] 19 (09/28 0322) BP: (136-171)/(56-67) 166/64 mmHg (09/28 0322) SpO2:  [97 %-99 %] 99 % (09/28 0322) Weight:  [92.8 kg (204 lb 9.4 oz)] 92.8 kg (204 lb 9.4 oz) (09/28 0322)  CBC:  Recent Labs Lab 03/14/15 0349 03/15/15 0424  WBC 9.5 7.6  HGB 16.1 15.0  HCT 47.5 44.6  MCV 86.4 84.8  PLT 179 001    Basic Metabolic Panel:  Recent Labs Lab 03/14/15 0349 03/15/15 0424  NA 142 139  K 3.9 3.8  CL 107 108  CO2 27 24  GLUCOSE 86 92  BUN 15 19  CREATININE 0.91 0.96  CALCIUM 9.9 9.4    Lipid Panel:    Component Value Date/Time   CHOL 217* 03/15/2015 0424   TRIG 169* 03/15/2015 0424   HDL 29* 03/15/2015 0424   CHOLHDL 7.5 03/15/2015 0424   VLDL 34 03/15/2015 0424   LDLCALC 154* 03/15/2015 0424   HgbA1c:  Lab Results   Component Value Date   HGBA1C 5.5 11/20/2013   Urine Drug Screen: No results found for: LABOPIA, COCAINSCRNUR, LABBENZ, AMPHETMU, THCU, LABBARB    IMAGING I have personally reviewed the radiological images below and agree with the radiology interpretations.  Mra Head Wo Contrast 03/14/2015    Multiple small areas of infarct involving the right frontal lobe, right parietal lobe, and left parietal lobe. These are consistent with acute embolic infarction.  Generalized atrophy. Ventricular enlargement consistent with atrophy   Intracranial atherosclerotic disease of a moderate degree with multiple areas of stenosis as described above.   10 mm left parotid nodule. This may represent a lymph node, cyst, or parotid neoplasm. Further evaluation with CT neck with contrast is suggested.    Dominic Brain Wo Contrast 03/14/2015    MRA HEAD FINDINGS   Hypoplastic right vertebral artery which extends to the basilar.  Moderately severe stenosis proximal right posterior cerebral artery.   Atherosclerotic disease in the cavernous carotid bilaterally with mild-to-moderate stenosis left greater than right.   Moderate to severe stenosis at the of the middle branch of the right middle cerebral artery trifurcation. Mild to moderate stenosis distal left M1 segment.    CUS - Bilateral: 1-39% ICA stenosis. Vertebral artery flow is antegrade. Right vertebral waveform demonstrates loss of diastolic component.  Cardiac cath -  normal LVEF   PHYSICAL EXAM  Temp:  [97.4 F (36.3 C)-98.5 F (36.9 C)] 97.4 F (36.3 C) (09/28 1559) Pulse Rate:  [58-67] 60 (09/28 1559) Resp:  [17-20] 20 (09/28 1559) BP: (123-166)/(59-69) 140/63 mmHg (09/28 1559) SpO2:  [96 %-99 %] 96 % (09/28 1559) Weight:  [204 lb 9.4 oz (92.8 kg)] 204 lb 9.4 oz (92.8 kg) (09/28 0322)  General - Well nourished, well developed, in no apparent distress.  Ophthalmologic - Sharp disc margins OU.   Cardiovascular - Regular rate and rhythm with  no murmur.  Mental Status -  Level of arousal and orientation to time, place, and person were intact. Language including expression, naming, repetition, comprehension was assessed and found intact. Fund of Knowledge was assessed and was intact.  Cranial Nerves II - XII - II - Visual field intact OU. III, IV, VI - Extraocular movements intact. V - Facial sensation intact bilaterally. VII - Facial movement intact bilaterally. VIII - Hearing & vestibular intact bilaterally. X - Palate elevates symmetrically. XI - Chin turning & shoulder shrug intact bilaterally. XII - Tongue protrusion intact.  Motor Strength - The patient's strength was normal in all extremities and pronator drift was absent.  Bulk was normal and fasciculations were absent.   Motor Tone - Muscle tone was assessed at the neck and appendages and was normal.  Reflexes - The patient's reflexes were 1+ in all extremities and he had no pathological reflexes.  Sensory - Light touch, temperature/pinprick, vibration and proprioception, and Romberg testing were assessed and were symmetrical.    Coordination - The patient had normal movements in the hands and feet with no ataxia or dysmetria.  Tremor was absent.  Gait and Station - mild stooped posturing but steady with normal gait.   ASSESSMENT/PLAN Dominic. OZZY [name] is a 81 y.o. male with history of dyslipidemia, lung nodule, paroxysmal atrial fibrillation, obstructive sleep apnea, coronary artery disease, hypertension, atrial fibrillation, and recent percutaneous intervention followed by unsteadiness of gait. He did not receive IV t-PA due to late presentation.   Strokes:  Bilateral punctate acute infarcts, R>L, likely embolic related to cath procedure. Less likely due to afib hold off eliquis for cath due to temporary relationship  Resultant  Resolution of deficit  MRI  Multiple small areas of infarct involving the right frontal lobe, right parietal lobe, and left parietal  lobe.  MRA  diffuse moderate to severe cerebrovascular disease.  Carotid Doppler unremarkable except possible right VA stenosis  Cardiac cath normal LVEF  LDL 154, not at goal  HgbA1c pending  VTE prophylaxis - SCDs - Eliquis to be restarted.  Diet Heart Room service appropriate?: Yes; Fluid consistency:: Thin  aspirin 81 mg orally every day and eliquis (apixaban) prior to admission, now on aspirin 81 mg orally every day and clopidogrel 75 mg orally every day.   Ongoing aggressive stroke risk factor management  Therapy recommendations:  Pending  Disposition:  Pending  pAfib  On home eliquis  eliquis was on hold for cardiac cath procedure  Due to punctate infarcts, the risk of hemorrhagic transformation is small. OK to restart eliquis for stroke prevention.   Hypertension  Stable  Hyperlipidemia  Home meds:  No lipid lowering medications prior to admission  LDL 154, goal < 70  Statin allergy  Need to consider PCSK9 inhibitors with PCP as outpt  Other Stroke Risk Factors  Advanced age  Coronary artery disease  OSA, on CPAP at home  Other Active Problems  10  mm left parotid nodule. Follow-up recommended.  Hospital day # 6  Neurology will sign off. Please call with questions. No neuro follow up needed at this time. Thanks for the consult.  Rosalin Hawking, MD PhD Stroke Neurology 03/15/2015 6:24 PM    To contact Stroke Continuity provider, please refer to [email or web address] After hours, contact General Neurology

## 2015-03-15 NOTE — Progress Notes (Signed)
Patient Name: Dominic [name] Date of Encounter: 03/15/2015   SUBJECTIVE  Feeling well. No chest pain, sob or palpitations. Walked to hallway without any discomfort.   CURRENT MEDS . amLODipine  5 mg Oral Daily  . aspirin  81 mg Oral Daily  . clopidogrel  75 mg Oral Daily  . metoprolol succinate  75 mg Oral Daily  . sodium chloride  3 mL Intravenous Q12H  . sodium chloride  3 mL Intravenous Q12H    OBJECTIVE  Filed Vitals:   03/14/15 1229 03/14/15 1555 03/14/15 2116 03/15/15 0322  BP: 136/67 171/58 159/61 166/64  Pulse: 61 55 62 60  Temp: 98 F (36.7 C) 98 F (36.7 C) 98.5 F (36.9 C) 97.5 F (36.4 C)  TempSrc: Oral Oral Oral Axillary  Resp: 18 18 17 19   Height:      Weight:    204 lb 9.4 oz (92.8 kg)  SpO2: 98% 98% 97% 99%    Intake/Output Summary (Last 24 hours) at 03/15/15 0755 Last data filed at 03/14/15 2119  Gross per 24 hour  Intake    240 ml  Output    200 ml  Net     40 ml   Filed Weights   03/13/15 0555 03/14/15 0320 03/15/15 0322  Weight: 200 lb 14.4 oz (91.128 kg) 203 lb 11.3 oz (92.4 kg) 204 lb 9.4 oz (92.8 kg)    PHYSICAL EXAM  General: Pleasant, NAD. Neuro: Alert and oriented X 3. Moves all extremities spontaneously. Psych: Normal affect. HEENT:  Normal  Neck: Supple without bruits or JVD. Lungs:  Resp regular and unlabored, CTA. Heart: RRR no s3, s4, or murmurs. Abdomen: Soft, non-tender, non-distended, BS + x 4.  Extremities: No clubbing, cyanosis or edema. DP/PT/Radials 2+ and equal bilaterally.  Right radial cath site without hematoma.   Accessory Clinical Findings  CBC  Recent Labs  03/14/15 0349 03/15/15 0424  WBC 9.5 7.6  HGB 16.1 15.0  HCT 47.5 44.6  MCV 86.4 84.8  PLT 179 413   Basic Metabolic Panel  Recent Labs  03/14/15 0349 03/15/15 0424  NA 142 139  K 3.9 3.8  CL 107 108  CO2 27 24  GLUCOSE 86 92  BUN 15 19  CREATININE 0.91 0.96  CALCIUM 9.9 9.4   Fasting Lipid Panel  Recent Labs  03/15/15 0424    CHOL 217*  HDL 29*  LDLCALC 154*  TRIG 169*  CHOLHDL 7.5   Thyroid Function Tests No results for input(s): TSH, T4TOTAL, T3FREE, THYROIDAB in the last 72 hours.  Invalid input(s): FREET3  TELE  NSR at rate of 50-60s  Radiology/Studies  Dg Chest 2 View  03/09/2015   CLINICAL DATA:  Chest tightness for 2 or 3 days, worse today. Abnormal EKG. History of atrial fibrillation and stents. Initial encounter.  EXAM: CHEST  2 VIEW  COMPARISON:  02/21/2015 and 04/19/2014 radiographs.  FINDINGS: The heart size and mediastinal contours are stable. Coronary stents are noted. There is stable elevation of the right hemidiaphragm with associated bibasilar atelectasis or scarring. No edema, confluent airspace opacity or significant pleural effusion. The bones appear unchanged.  IMPRESSION: Stable chest.  No acute cardiopulmonary process.   Electronically Signed   By: Richardean Sale M.D.   On: 03/09/2015 16:19   Ct Head Wo Contrast  02/21/2015   CLINICAL DATA:  Dizziness following fall. Patient receiving blood thinner medication. Atrial fibrillation.  EXAM: CT HEAD WITHOUT CONTRAST  TECHNIQUE: Contiguous axial images were obtained from  the base of the skull through the vertex without intravenous contrast.  COMPARISON:  April 19, 2014  FINDINGS: There is moderate generalized atrophy with the ventricles appearing slightly larger in proportion than the sulci. There is no intracranial mass, hemorrhage, extra-axial fluid collection, or midline shift. There is slight small vessel disease in the centra semiovale bilaterally. Elsewhere gray-white compartments appear normal. No acute infarct evident. Bony calvarium appears intact. The mastoid air cells are clear.  IMPRESSION: Atrophy with a possible degree of concomitant communicating hydrocephalus. There is slight periventricular small vessel disease. No intracranial mass, hemorrhage, or acute appearing infarct. No extra-axial fluid collections are appreciable on this  study.   Electronically Signed   By: Lowella Grip III M.D.   On: 02/21/2015 08:59   Mr Jodene Nam Head Wo Contrast  03/14/2015   CLINICAL DATA:  Stroke. Cardiac catheterization and angioplasty yesterday.  EXAM: MRI HEAD WITHOUT CONTRAST  MRA HEAD WITHOUT CONTRAST  TECHNIQUE: Multiplanar, multiecho pulse sequences of the brain and surrounding structures were obtained without intravenous contrast. Angiographic images of the head were obtained using MRA technique without contrast.  COMPARISON:  CT head 02/21/2015  FINDINGS: MRI HEAD FINDINGS  Small punctate areas of acute infarct in the right frontal parietal cortex, measuring 5 mm. 5 mm area of acute infarct in the left medial parietal cortex. These are most likely embolic acute infarcts. No other acute infarct.  Moderate atrophy. Ventricular enlargement is similar to prior studies and most consistent with atrophy. No significant chronic ischemia. Negative for hemorrhage or mass.  10 mm nodule left parotid gland. This may represent a parotid lymph node or cyst or possibly a parotid neoplasm. Mild mucosal edema in the paranasal sinuses. Orbit is normal bilaterally. Pituitary not enlarged.  MRA HEAD FINDINGS  Left vertebral artery widely patent. Hypoplastic right vertebral artery which extends to the basilar. Basilar widely patent. Left PICA patent. Superior cerebellar artery patent bilaterally. Moderately severe stenosis proximal right posterior cerebral artery. Mild stenosis left posterior cerebral artery.  Atherosclerotic disease in the cavernous carotid bilaterally with mild-to-moderate stenosis left greater than right.  Right anterior cerebral artery widely patent. Right M1 segment patent. Mild atherosclerotic disease right MCA trifurcation. Moderate to severe stenosis at the of the middle branch of the right middle cerebral artery trifurcation. Other branches are patent.  Mild to moderate stenosis distal left M1 segment. Left anterior cerebral artery patent. Mild  disease in the left middle cerebral artery branches.  Negative for cerebral aneurysm.  IMPRESSION: Multiple small areas of infarct involving the right frontal lobe, right parietal lobe, and left parietal lobe. These are consistent with acute embolic infarction.  Generalized atrophy. Ventricular enlargement consistent with atrophy  10 mm left parotid nodule. This may represent a lymph node, cyst, or parotid neoplasm. Further evaluation with CT neck with contrast is suggested  Intracranial atherosclerotic disease of a moderate degree with multiple areas of stenosis as described above.   Electronically Signed   By: Franchot Gallo M.D.   On: 03/14/2015 14:11   Mr Brain Wo Contrast  03/14/2015   CLINICAL DATA:  Stroke. Cardiac catheterization and angioplasty yesterday.  EXAM: MRI HEAD WITHOUT CONTRAST  MRA HEAD WITHOUT CONTRAST  TECHNIQUE: Multiplanar, multiecho pulse sequences of the brain and surrounding structures were obtained without intravenous contrast. Angiographic images of the head were obtained using MRA technique without contrast.  COMPARISON:  CT head 02/21/2015  FINDINGS: MRI HEAD FINDINGS  Small punctate areas of acute infarct in the right frontal parietal cortex, measuring  5 mm. 5 mm area of acute infarct in the left medial parietal cortex. These are most likely embolic acute infarcts. No other acute infarct.  Moderate atrophy. Ventricular enlargement is similar to prior studies and most consistent with atrophy. No significant chronic ischemia. Negative for hemorrhage or mass.  10 mm nodule left parotid gland. This may represent a parotid lymph node or cyst or possibly a parotid neoplasm. Mild mucosal edema in the paranasal sinuses. Orbit is normal bilaterally. Pituitary not enlarged.  MRA HEAD FINDINGS  Left vertebral artery widely patent. Hypoplastic right vertebral artery which extends to the basilar. Basilar widely patent. Left PICA patent. Superior cerebellar artery patent bilaterally. Moderately  severe stenosis proximal right posterior cerebral artery. Mild stenosis left posterior cerebral artery.  Atherosclerotic disease in the cavernous carotid bilaterally with mild-to-moderate stenosis left greater than right.  Right anterior cerebral artery widely patent. Right M1 segment patent. Mild atherosclerotic disease right MCA trifurcation. Moderate to severe stenosis at the of the middle branch of the right middle cerebral artery trifurcation. Other branches are patent.  Mild to moderate stenosis distal left M1 segment. Left anterior cerebral artery patent. Mild disease in the left middle cerebral artery branches.  Negative for cerebral aneurysm.  IMPRESSION: Multiple small areas of infarct involving the right frontal lobe, right parietal lobe, and left parietal lobe. These are consistent with acute embolic infarction.  Generalized atrophy. Ventricular enlargement consistent with atrophy  10 mm left parotid nodule. This may represent a lymph node, cyst, or parotid neoplasm. Further evaluation with CT neck with contrast is suggested  Intracranial atherosclerotic disease of a moderate degree with multiple areas of stenosis as described above.   Electronically Signed   By: Franchot Gallo M.D.   On: 03/14/2015 14:11   Dg Chest Port 1 View  02/21/2015   CLINICAL DATA:  Dizziness and atrial fibrillation  EXAM: PORTABLE CHEST - 1 VIEW  COMPARISON:  April 19, 2014  FINDINGS: There is stable scarring in the right lower lobe region. There is no edema or consolidation. Heart size and pulmonary vascularity are normal. No adenopathy. No bone lesions. There is a stent in the right coronary artery region.  IMPRESSION: No edema or consolidation. Stable scarring on the right. No change in cardiac silhouette.   Electronically Signed   By: Lowella Grip III M.D.   On: 02/21/2015 08:07    ASSESSMENT AND PLAN  81 yo male w/ PMH of CAD s/p prior stenting to the RCA in 2006, HTN, OSA, HLD, and PAF (s/p recent electrical  cardioversion 02/21/15)-on chronic anticoagulation with Eliquis, admitted on 03/09/2015 for NSTEMI. Cath on 03/10/2015 w/ successful PCI to the mid-RCA for in-stent restenosis with cutting balloon angioplasty and PCI to an OM2 lesion. There was residual LAD/D1 bifurcation lesion and staged PCI was done 03/13/2015.    Principal Problem:   CAD S/P percutaneous coronary angioplasty Active Problems:   Hypertension   Hyperlipidemia   RBBB (right bundle branch block)   PAF (paroxysmal atrial fibrillation)   OSA (obstructive sleep apnea)   NSTEMI (non-ST elevated myocardial infarction)   Chronic anticoagulation   Acute embolic CVA post cath   Plan: MRA of brain showed multiple small areas of infarct involving the right frontal lobe, right parietal lobe, and left parietal lobe. These are consistent with acute embolic infarction. Pending neurology recommendation regarding Eliquis. Patient denied statin (LDL 154). Consider eval by lipid clinic. BP still elevated, Consider increasing  Norvasc to 10mg  (HCTX stopped). Continue ASA. plavix and BB.  Ordered PT eval.   Signed, Bhagat,Bhavinkumar PA-C Pager [phone]  Agree with note by Robbie Lis PA-C  Results of MRI noted. CVA in mult vascular territories suggesting a 'shower' effect from PCI. Sx have resolved and he is at baseline now. No CP. Await Neuro F/U today to weigh in on starting Eliquis. If OK with Neuro can go home on ASA 81, Plavix 75 and Eliquis. Can Stop ASA after 1 month and contin Plavix and Eliquis for 12 months. F/U with Dr. Mare Ferrari,    Lorretta Harp, M.D., [place], [facility] [place], [place] [address] [place], [place]  [address]  [phone] 03/15/2015 10:54 AM

## 2015-03-15 NOTE — Progress Notes (Signed)
VASCULAR LAB PRELIMINARY  PRELIMINARY  PRELIMINARY  PRELIMINARY  Carotid duplex  completed.    Preliminary report:  Bilateral:  1-39% ICA stenosis.  Vertebral artery flow is antegrade.  Right vertebral waveform demonstrates loss of diastolic component.    Cestone,Helene, RVT 03/15/2015, 4:19 PM

## 2015-03-15 NOTE — Clinical Documentation Improvement (Signed)
Cardiology  Abnormal Lab/Test Results:  *Creatinine values since admission have ranged from 0.81 to 1.16*  Possible Clinical Conditions associated with below indicators  *Acute renal failure  *Acute renal failure associated with ATN  *Normal renal function  Other Condition  Cannot Clinically Determine   Supporting Information: *MD notes 9/22 and 9/23 state "Renal function is normal *Patient has undergone two interventional cardiac cath lab procedures this admission*   Please exercise your independent, professional judgment when responding. A specific answer is not anticipated or expected.   Thank You,  [place] [phone]

## 2015-03-15 NOTE — Discharge Summary (Signed)
Discharge Summary   Patient ID: Dominic [name],  MRN: [id number], DOB/AGE: 25-May-1934 81 y.o.  Admit date: 03/09/2015 Discharge date: 03/15/2015  Primary Care Provider:  Melinda Crutch Primary Cardiologist: Dr. Mare Ferrari  Discharge Diagnoses Principal Problem:   CAD S/P percutaneous coronary angioplasty Active Problems:   Hypertension   Hyperlipidemia   RBBB (right bundle branch block)   PAF (paroxysmal atrial fibrillation)   OSA (obstructive sleep apnea)   NSTEMI (non-ST elevated myocardial infarction)   Chronic anticoagulation   Acute embolic CVA post cath   Allergies Allergies  Allergen Reactions  . Statins Other (See Comments)    Myalgia  . Zetia [Ezetimibe] Other (See Comments)    myalgia    Procedures  Cath 03/10/15 Conclusion     Mid RCA lesion, 80% stenosed.  Mid LAD lesion, 75% stenosed.  Ost 2nd Diag to 2nd Diag lesion, 99% stenosed.  2nd Mrg lesion, 95% stenosed. There is a 0% residual stenosis post intervention.  There is a 0% residual stenosis post intervention.  The left ventricular systolic function is normal   Coronary Findings    Dominance: Right   Left Anterior Descending   . Mid LAD lesion, 75% stenosed.   . Second Diagonal Branch   . Ost 2nd Diag to 2nd Diag lesion, 99% stenosed.     Left Circumflex   . Second [employer]   . 2nd Mrg lesion, 95% stenosed.   Marland Kitchen PCI: An unspecified stent was placed.  . There is no residual stenosis post intervention.       Right Coronary Artery   . Prox RCA to Mid RCA lesion, 0% stenosed. Previously placed Prox RCA to Mid RCA stent (unknown type) is patent.   Marland Kitchen PCI: An unspecified stent was placed.  . There is no residual stenosis post intervention.     . Mid RCA lesion, 80% stenosed.   Marland Kitchen PCI: An unspecified stent was placed.  . There is no residual stenosis post intervention.       Cath 03/13/15 Conclusion     Mid LAD lesion, 75% stenosed. There is a 0% residual stenosis post  intervention.  A drug-eluting stent was placed.  Ost 2nd Diag to 2nd Diag lesion, 99% stenosed. There is a 30% residual stenosis post intervention.  Successful PCI of the mid LAD with a DES and Angiosculpt balloon angioplasty of the second diagonal.   Plan: DAPT for 3 months then consider stopping ASA. May resume Eliquis tomorrow if no complications.       History of Present Illness The patient is a 81 year old male, followed by Dr. Mare Ferrari, who presented to the ED 03/09/15 with complaint of chest pain. The patient has a history of known coronary disease. He has had prior stenting with BMS to the RCA and subsequent Cypher DES to the RCA in 3/06. LHC in 12/06 demonstrated significant RCA ISR which was treated with a Taxus DES. Other history includes HTN, HL, sleep apnea, and PAF on chronic anticoagulation with Eliquis for a CHA2DS2 VASc score of 4. His most recent Myoview was in June 2015 and was negative for ischemia. EF was 56%. EF by 2D echo was also normal at 55-60% and no WMA.   He was recently evaluated in the [facility] emergency department on 02/21/2015 with complaints of palpitations and near-syncope. He was found to be in recurrent atrial fibrillation with rapid ventricular response. This was apparently in the setting of hypokalemia with potassium level of 3.2. He was given potassium  supplementation. He also required electrical cardioversion while in the emergency department. He was successfully converted to normal sinus rhythm and was discharged home from the ED without admission. He was seen in clinic by Dr. Mare Ferrari the following day for post ED follow-up. He was noted to still be in sinus rhythm stable from a cardiovascular standpoint.  03/09/15 presented back to the [facility] emergency department with complaint of recurrent chest discomfort off and on for the past week. His symptoms felt similar to his prior symptoms before undergoing PCI to the RCA. He noted diffuse chest  pressure radiating to bilateral shoulders. He denies any associated dyspnea and no palpitations. Denies any exertional component. It is nonpleuritic. No exacerbating factors. He has not tried any therapies at home. Given his recurrent symptoms, he decided to come to the ED for evaluation.  EKG showed sinus rhythm with a chronic right bundle branch block. Stat troponin in the ED is abnormal at 0.42. His platelet count is also severely low at 99. This was down from 188 2 weeks ago. Hgb is stable at 14.8. He denies any abnormal bleeding. K is also abnormal at 6.8.  Most recent LDL was 166. H/o statin and zetia intolerance. Given his risk factors, need to consider retrial of statin. He apparently was evaluated for PCSK9 inhibitors in lipid clinic and deemed not a candidate.  Hospital Course  He was admitted and started in IV heparin. Eliquis was held. It was felt that his high K was due to hemolyzed sample. Repeat K was normal.   03/10/15: Patient had Successful PCI  to the mid-RCA for in-stent restenosis with cutting balloon angioplasty and PCI to an OM2 lesion with a Synergy DES 2.25 x 16 mm. There is a residual LAD/D1 bifurcation lesion for staged PCI planned for Monday 03/13/15.  03/11/15: No further chest pain. BP elevated from 132-440 systolic. Was on home HCTZ, but no ACE-I or ARB, deferred addition at that time. Amlodipine 5mg  was added to regimen.  03/12/15: BP was relatively stable. No CP.   03/13/15: Denied any chest pain or shortness of breath. This platelet count improved to 169. Patient had successful staged PCI of the mid LAD with a DES and Angiosculpt balloon angioplasty of the second diagonal.   03/14/15: Overnight patient had dizziness. No focal deficits. Pt had ataix gait while walking with cardiac rehab. Seen by neurology and obtained MRI of brain which showed CVA in mult vascular territories suggesting a 'shower' effect from PCI.  03/15/15: His neurological s/s has resolved. Patient  was evaluated by PT -->Recomended outpatient PT to improve strength and flexibility.   She has been seen by Dr. Gwenlyn Found today and deemed ready for discharge home. All follow-up appointments have been scheduled. Discharge medications are listed below.   Go home on ASA 81, Plavix 75 and Eliquis. Can Stop ASA after 1 month and contine Plavix and Eliquis for 12 months. F/U with Dr. Mare Ferrari. Consider adding ACE/ARB as outpatient. He is statin intolerance. Can try pravastatin. LDL 154.  [place] office will call him with outpatient PT follow up. Preliminary carotid doppler showed 1-39% stenosis bilaterally. Please review result with patient during outpatient visit.   Discharge Vitals Blood pressure 140/63, pulse 60, temperature 97.4 F (36.3 C), temperature source Oral, resp. rate 20, height 6' (1.829 m), weight 204 lb 9.4 oz (92.8 kg), SpO2 96 %.  Filed Weights   03/13/15 0555 03/14/15 0320 03/15/15 0322  Weight: 200 lb 14.4 oz (91.128 kg) 203 lb 11.3  oz (92.4 kg) 204 lb 9.4 oz (92.8 kg)    Labs  CBC  Recent Labs  03/14/15 0349 03/15/15 0424  WBC 9.5 7.6  HGB 16.1 15.0  HCT 47.5 44.6  MCV 86.4 84.8  PLT 179 292   Basic Metabolic Panel  Recent Labs  03/14/15 0349 03/15/15 0424  NA 142 139  K 3.9 3.8  CL 107 108  CO2 27 24  GLUCOSE 86 92  BUN 15 19  CREATININE 0.91 0.96  CALCIUM 9.9 9.4   Fasting Lipid Panel  Recent Labs  03/15/15 0424  CHOL 217*  HDL 29*  LDLCALC 154*  TRIG 169*  CHOLHDL 7.5    Disposition  Pt is being discharged home today in good condition.  Follow-up Plans & Appointments  Follow-up Information    Follow up with Eileen Stanford, PA-C On 03/23/2015.   Specialties:  Cardiology, Radiology   Why:  @3 :35 for cardiology TCM   Contact information:   [place] [phone]           Discharge Instructions    Amb Referral to Cardiac Rehabilitation    Complete by:  As directed    Congestive Heart Failure: If diagnosis is Heart Failure, patient MUST meet each of the CMS criteria: 1. Left Ventricular Ejection Fraction </= 35% 2. NYHA class II-IV symptoms despite being on optimal heart failure therapy for at least 6 weeks. 3. Stable = have not had a recent (<6 weeks) or planned (<6 months) major cardiovascular hospitalization or procedure  Program Details: - Physician supervised classes - 1-3 classes per week over a 12-18 week period, generally for a total of 36 sessions  Physician Certification: I certify that the above Cardiac Rehabilitation treatment is medically necessary and is medically approved by me for treatment of this patient. The patient is willing and cooperative, able to ambulate and medically stable to participate in exercise rehabilitation. The participant's progress and Individualized Treatment Plan will be reviewed by the Medical Director, Cardiac Rehab staff and as indicated by the Referring/Ordering Physician.  Diagnosis:   Myocardial Infarction PCI       Call MD for:  redness, tenderness, or signs of infection (pain, swelling, redness, odor or green/yellow discharge around incision site)    Complete by:  As directed      Diet - low sodium heart healthy    Complete by:  As directed      Discharge instructions    Complete by:  As directed   No driving for 2 weeks. No lifting over 10 lbs for 4 weeks. No sexual activity for 4 weeks. You may not return to work until cleared by your cardiologist in clinic. Keep procedure site clean & dry. If you notice increased pain, swelling, bleeding or pus, call/return!  You may shower, but no soaking baths/hot tubs/pools for 1 week.     Increase activity slowly    Complete by:  As directed            F/u Labs/Studies: None  Discharge Medications    Medication List    STOP taking these medications        hydrochlorothiazide 12.5 MG capsule  Commonly known as:  MICROZIDE      TAKE these medications         acetaminophen 325 MG tablet  Commonly known as:  TYLENOL  Take 2 tablets (650 mg total) by mouth every 4 (four) hours as needed for headache or  mild pain.     amLODipine 5 MG tablet  Commonly known as:  NORVASC  Take 1 tablet (5 mg total) by mouth daily.     apixaban 5 MG Tabs tablet  Commonly known as:  ELIQUIS  Take 1 tablet (5 mg total) by mouth 2 (two) times daily.     aspirin 81 MG EC tablet  Take 1 tablet (81 mg total) by mouth daily.     clopidogrel 75 MG tablet  Commonly known as:  PLAVIX  Take 1 tablet (75 mg total) by mouth daily.     METAMUCIL PO  Take 15 mLs by mouth 2 (two) times daily. Mix in water and drink     metoprolol succinate 25 MG 24 hr tablet  Commonly known as:  TOPROL-XL  Take 3 tablets (75 mg total) by mouth daily.     nitroGLYCERIN 0.4 MG SL tablet  Commonly known as:  NITROSTAT  PLACE ONE TABLET UNDER THE TONGUE EVERY 5 MINUTES AS NEEDED FOR CHEST PAIN     NON FORMULARY  tocotrienols are vitamin E supplement, Lutein-zeaxanthis, and piracetam     Piracetam Powd  Take 1 scoop by mouth daily.     PRESERVISION AREDS Caps  Take 1 capsule by mouth 2 (two) times daily.     Ubiquinol 100 MG Caps  Take 100 mg by mouth daily.        Duration of Discharge Encounter   Greater than 30 minutes including physician time.  Signed, Keylee Shrestha PA-C 03/15/2015, 4:12 PM

## 2015-03-15 NOTE — Procedures (Signed)
Pt placed on CPAP with auto mode setting; max-20, min-6.  Pt is comfortable and tolerating well.

## 2015-03-15 NOTE — Progress Notes (Signed)
CARDIAC REHAB PHASE I   PRE:  Rate/Rhythm: 68 SR    BP: sitting 120/59    SaO2:   MODE:  Ambulation: 1000 ft   POST:  Rate/Rhythm: 96 SR    BP: sitting 154/72     SaO2:   Pt able to walk long distance but continues to have slightly staggering gait, esp with right leg/hip. Sts he broke that hip last November and it healed on its own. Pt had x1 LOB when he tried to adjust his gown in the back while walking and talking.  Pt does not complain much, requires numerous questioning and wife's assistance to get answers. Rest x1 after walking hill for SOB. HR 96 SR. [id number]  Josephina Shih [place] CES, [place] 03/15/2015 10:17 AM

## 2015-03-16 DIAGNOSIS — H401223 Low-tension glaucoma, left eye, severe stage: Secondary | ICD-10-CM | POA: Diagnosis not present

## 2015-03-16 DIAGNOSIS — H5111 Convergence insufficiency: Secondary | ICD-10-CM | POA: Diagnosis not present

## 2015-03-16 DIAGNOSIS — H401212 Low-tension glaucoma, right eye, moderate stage: Secondary | ICD-10-CM | POA: Diagnosis not present

## 2015-03-16 LAB — HEMOGLOBIN A1C
HEMOGLOBIN A1C: 5.6 % (ref 4.8–5.6)
Mean Plasma Glucose: 114 mg/dL

## 2015-03-16 NOTE — Telephone Encounter (Signed)
Left message to call back   Patient contacted regarding discharge from [name] on 03/15/15.  Patient understands to follow up with provider [name] on 03/23/15 at 3:45 at [facility]. Patient understands discharge instructions?  Patient understands medications and regiment?  Patient understands to bring all medications to this visit?

## 2015-03-17 NOTE — Telephone Encounter (Signed)
Follow up    Wife returning call back to nurse

## 2015-03-17 NOTE — Telephone Encounter (Signed)
Spoke with wife and she stated patient doing well except tired They have been out taking walks Advised wife to bring medications to follow up appointment which she was aware of date and time No questions Advised to call if anything comes up before ov  Referral in Epic for outpatient PT at the request of  B Bhagat PAC, ok per  Dr. Mare Ferrari

## 2015-03-21 DIAGNOSIS — H4912 Fourth [trochlear] nerve palsy, left eye: Secondary | ICD-10-CM | POA: Diagnosis not present

## 2015-03-21 DIAGNOSIS — H5203 Hypermetropia, bilateral: Secondary | ICD-10-CM | POA: Diagnosis not present

## 2015-03-23 ENCOUNTER — Ambulatory Visit (INDEPENDENT_AMBULATORY_CARE_PROVIDER_SITE_OTHER): Payer: Medicare Other | Admitting: Physician Assistant

## 2015-03-23 ENCOUNTER — Encounter: Payer: Self-pay | Admitting: Physician Assistant

## 2015-03-23 VITALS — BP 122/62 | HR 52 | Ht 72.0 in | Wt 203.4 lb

## 2015-03-23 DIAGNOSIS — I200 Unstable angina: Secondary | ICD-10-CM | POA: Diagnosis not present

## 2015-03-23 DIAGNOSIS — I2510 Atherosclerotic heart disease of native coronary artery without angina pectoris: Secondary | ICD-10-CM | POA: Diagnosis not present

## 2015-03-23 DIAGNOSIS — R29898 Other symptoms and signs involving the musculoskeletal system: Secondary | ICD-10-CM | POA: Diagnosis not present

## 2015-03-23 NOTE — Patient Instructions (Addendum)
Medication Instructions:   Your physician recommends that you continue on your current medications as directed. BUT ON 04/13/15 STOP TAKING ASPIRIN   Labwork:  NONE ORDER TODAY  Testing/Procedures:  NONE ORDER TODAY   Follow-Up:  YOU HAVE BEEN REFFERED TO PHYSICAL THERAPY TO BE ESTABLISHED AS A NEW PATIENT.   Any Other Special Instructions Will Be Listed Below (If Applicable).

## 2015-03-23 NOTE — Progress Notes (Signed)
Cardiology Office Note    Date:  03/23/2015   ID:  Dominic, [name] 1933/08/20, MRN [id number]  PCP:   Melinda Crutch, MD  Cardiologist:  Dr. Mare Ferrari     CC: post hospital follow up   History of Present Illness: Dominic [name] is a 81 y.o. male w/ PMH of CAD s/p DES to RCA for ISR in 2006, HTN, OSA, HLD, OSA and PAF (s/p recent electrical cardioversion 02/21/15)-on Eliquis who presents to the office for post hospital follow up for recent admission for NSTEMI complicated by CVA.   He has a hx of prior stenting with BMS to the RCA and subsequent Cypher DES to the RCA in 3/06. LHC in 12/06 demonstrated significant RCA ISR which was treated with a Taxus DES. His most recent Myoview was in June 2015 and was negative for ischemia. EF was 56%. EF by 2D echo was also normal at 55-60% and no WMA.   He was recently evaluated in the [facility] emergency department on 02/21/2015 with complaints of palpitations and near-syncope. He was found to be in recurrent atrial fibrillation with rapid ventricular response. This was apparently in the setting of hypokalemia with potassium level of 3.2. He was given potassium supplementation. He also required electrical cardioversion while in the emergency department. He was successfully converted to normal sinus rhythm and was discharged home from the ED without admission. He was seen in clinic by Dr. Mare Ferrari the following day for post ED follow-up. He was noted to still be in sinus rhythm  Most recent LDL was 166. H/o statin and zetia intolerance. Given his risk factors, need to consider retrial of statin. He apparently was evaluated for PCSK9 inhibitors in lipid clinic and deemed not a candidate.  He was admitted from 09/22- 03/15/2015 for NSTEMI. He initially presented with chest pain x1 week. His symptoms felt similar to his prior symptoms before undergoing PCI to the RCA.  Stat troponin in the ED was abnormal at 0.42. His platelet count was also severely low at  99. This was down from 188 2 weeks ago. Hgb was stable at 14.8 and K elevated at 6.8. He was admitted and started on IV heparin. Eliquis was held. It was felt that his high K was due to hemolyzed sample. Repeat K was normal.   He underwent cath on 03/10/2015 w/ successful PCI to the mid-RCA for in-stent restenosis with cutting balloon angioplasty and PCI to an OM2 lesion. There was residual LAD/D1 bifurcation lesion and staged PCI was done 03/13/2015.   Amlodipine 5mg  was added to regimen for hypertension  On the night of 03/14/15 the patient had dizziness. No focal deficits. Pt had ataix gait while walking with cardiac rehab. Seen by neurology and obtained MRI of brain which showed CVA in mult vascular territories suggesting a 'shower' effect from PCI. His neurological s/s has resolved. Patient was evaluated by PT -->Recomended outpatient PT to improve strength and flexibility.  He was discharged home on ASA 81, Plavix 75 and Eliquis. Can Stop ASA after 1 month and contine Plavix and Eliquis for 12 months.  Today he presents for post hospital follow up. He is feeling well and has no complaints. Tolerating medications no bleeding. No CP SOB, orthopnea, PND, LE edema, palpitations, presyncope. Daughter has a lot of questions. She asks about his cholesterol and tells me they are trying a new strict plant based diet and CoQ10. She has questions about the supplements.   Studies:  - LHC  Cath 03/10/15 Conclusion     Mid RCA lesion, 80% stenosed.  Mid LAD lesion, 75% stenosed.  Ost 2nd Diag to 2nd Diag lesion, 99% stenosed.  2nd Mrg lesion, 95% stenosed. There is a 0% residual stenosis post intervention.  There is a 0% residual stenosis post intervention.  The left ventricular systolic function is normal   Coronary Findings    Dominance: Right   Left Anterior Descending   . Mid LAD lesion, 75% stenosed.   . Second Diagonal Branch   . Ost 2nd Diag to 2nd Diag lesion, 99%  stenosed.     Left Circumflex   . Second [employer]   . 2nd Mrg lesion, 95% stenosed.   Marland Kitchen PCI: An unspecified stent was placed.  . There is no residual stenosis post intervention.       Right Coronary Artery   . Prox RCA to Mid RCA lesion, 0% stenosed. Previously placed Prox RCA to Mid RCA stent (unknown type) is patent.   Marland Kitchen PCI: An unspecified stent was placed.  . There is no residual stenosis post intervention.     . Mid RCA lesion, 80% stenosed.   Marland Kitchen PCI: An unspecified stent was placed.  . There is no residual stenosis post intervention.       Cath 03/13/15 Conclusion     Mid LAD lesion, 75% stenosed. There is a 0% residual stenosis post intervention.  A drug-eluting stent was placed.  Ost 2nd Diag to 2nd Diag lesion, 99% stenosed. There is a 30% residual stenosis post intervention.  Successful PCI of the mid LAD with a DES and Angiosculpt balloon angioplasty of the second diagonal.   Plan: DAPT for 3 months then consider stopping ASA. May resume Eliquis tomorrow if no complications.          Recent Labs/Images:   Recent Labs  12/28/14 1057 02/21/15 0736 02/21/15 0820  03/15/15 0424  NA 141 142  --   < > 139  K 3.6 3.2*  --   < > 3.8  BUN 20 18  --   < > 19  CREATININE 0.95 1.04  --   < > 0.96  ALT 11 16*  --   --   --   HGB  --  16.9  --   < > 15.0  TSH  --   --  1.916  --   --   LDLCALC  --   --   --   --  154*  LDLDIRECT 146.0  --   --   --   --   HDL 33.00*  --   --   --  29*  < > = values in this interval not displayed.   Dg Chest 2 View  03/09/2015   CLINICAL DATA:  Chest tightness for 2 or 3 days, worse today. Abnormal EKG. History of atrial fibrillation and stents. Initial encounter.  EXAM: CHEST  2 VIEW  COMPARISON:  02/21/2015 and 04/19/2014 radiographs.  FINDINGS: The heart size and mediastinal contours are stable. Coronary stents are noted. There is stable elevation of the right  hemidiaphragm with associated bibasilar atelectasis or scarring. No edema, confluent airspace opacity or significant pleural effusion. The bones appear unchanged.  IMPRESSION: Stable chest.  No acute cardiopulmonary process.   Electronically Signed   By: Richardean Sale M.D.   On: 03/09/2015 16:19     Wt Readings from Last 3 Encounters:  03/15/15 204 lb 9.4 oz (92.8 kg)  02/22/15 207 lb  6.4 oz (94.076 kg)  02/21/15 205 lb (92.987 kg)     Past Medical History  Diagnosis Date  . Dyslipidemia     a. Intol of statins.  . Lung nodule     , right upper lobe  . Obstructive sleep apnea   . Prostate cancer   . Amputated finger     a. L index d/t to dog bite.  . Concussion   . Coronary artery disease 09/2004, 04/2005, 02/2015    a. Stent to prox and mid RCA 08/2001. b. DES to RCA for ISR 08/2004. c. DES to [facility] for ISR 05/2005. d. Low risk nuc 10/2013 (done for CP in setting of new AF). e. PCI to the mid-RCA for in-stent restenosis with cutting balloon angioplasty f. PCI to an OM2 lesion  . Hypertension   . RBBB (right bundle branch block)   . Atrial fibrillation with RVR     a. New onset diagnosed 11/20/2013, spont converted to NSR.  Marland Kitchen OSA (obstructive sleep apnea)     Current Outpatient Prescriptions  Medication Sig Dispense Refill  . acetaminophen (TYLENOL) 325 MG tablet Take 2 tablets (650 mg total) by mouth every 4 (four) hours as needed for headache or mild pain.    Marland Kitchen amLODipine (NORVASC) 5 MG tablet Take 1 tablet (5 mg total) by mouth daily. 30 tablet 11  . apixaban (ELIQUIS) 5 MG TABS tablet Take 1 tablet (5 mg total) by mouth 2 (two) times daily. 180 tablet 1  . aspirin 81 MG EC tablet Take 1 tablet (81 mg total) by mouth daily. 30 tablet 6  . clopidogrel (PLAVIX) 75 MG tablet Take 1 tablet (75 mg total) by mouth daily. 90 tablet 3  . metoprolol succinate (TOPROL-XL) 25 MG 24 hr tablet Take 3 tablets (75 mg total) by mouth daily. 90 tablet 11  . Multiple Vitamins-Minerals (PRESERVISION  AREDS) CAPS Take 1 capsule by mouth 2 (two) times daily.    . nitroGLYCERIN (NITROSTAT) 0.4 MG SL tablet PLACE ONE TABLET UNDER THE TONGUE EVERY 5 MINUTES AS NEEDED FOR CHEST PAIN 25 tablet 3  . NON FORMULARY tocotrienols are vitamin E supplement, Lutein-zeaxanthis, and piracetam    . Piracetam POWD Take 1 scoop by mouth daily.     . Psyllium (METAMUCIL PO) Take 15 mLs by mouth 2 (two) times daily. Mix in water and drink    . Ubiquinol 100 MG CAPS Take 100 mg by mouth daily.     No current facility-administered medications for this visit.     Allergies:   Statins and Zetia   Social History:  The patient  reports that he has never smoked. He has never used smokeless tobacco. He reports that he does not drink alcohol or use illicit drugs.   Family History:  The patient's family history includes Cancer in his father; Emphysema in his mother.   ROS:  Please see the history of present illness.  All other systems reviewed and negative.    PHYSICAL EXAM: VS:  There were no vitals taken for this visit. Well nourished, well developed, in no acute distress HEENT: normal Neck: no JVD Cardiac:  normal S1, S2; RRR; no murmur Lungs:  clear to auscultation bilaterally, no wheezing, rhonchi or rales Abd: soft, nontender, no hepatomegaly Ext: no edema Skin: warm and dry Neuro:  CNs 2-12 intact, no focal abnormalities noted  EKG:  HR 52 NSR       ASSESSMENT AND PLAN:  Dominic [name] is a 81 y.o.  male w/ PMH of CAD s/p DES to RCA for ISR in 2006, HTN, OSA, HLD, OSA and PAF (s/p recent electrical cardioversion 02/21/15)-on Eliquis who presents to the office for post hospital follow up for recent admission for NSTEMI complicated by CVA.   CAD s/p recent NSTEMI: He underwent cath on 03/10/2015 w/ successful PCI to the mid-RCA for in-stent restenosis with cutting balloon angioplasty and PCI to an OM2 lesion. There was residual LAD/D1 bifurcation lesion and staged PCI was done 03/13/2015.  -- He was  discharged home on ASA 81, Plavix 75 and Eliquis. Can Stop ASA after 1 month and contine Plavix and Eliquis for 12 months. He will stop taking ASA on 04/13/15. -- Continue BB. He is statin intolerant.   HLD- Most recent LDL was 154. H/o statin and zetia intolerance. Given his risk factors, need to consider retrial of statin. He apparently was evaluated for PCSK9 inhibitors in lipid clinic and deemed not a candidate. He is trying CoQ 10 for this and a new plant based diet to lower lipids. Daughter would like lipids rechecked at next visit.   CVA- On the night of 03/14/15 the patient had dizziness. No focal deficits. Pt had ataix gait while walking with cardiac rehab. Seen by neurology and obtained MRI of brain which showed CVA in mult vascular territories suggesting a 'shower' effect from PCI. His neurological s/s has resolved. Patient was evaluated by PT -->Recomended outpatient PT to improve strength and flexibility. Will make sure this is arranged.  -- Neuro cleared him from needing neurology follow up  PAF- patient NSR HR 52 -- (CHADS2-VASc=4) continue eliquis and toprol xl 25mg    Carotid artery stenosis- carotid dopplers done in the hospital showed 1-39% stenosis bilaterally.   HTN- BP 112/62mg  Hg. Cont current regimen. Daughter is worried that his BP will drop as they are trying a new plant based diet with no salt.  I told her to monitor BP at home and if it gets low we will decrease the norvasc or discontinue it  OSA- he has an appointment with Dr. Radford Pax in December for this  Disposition:  FU with Dr Radford Pax on 05/30/15 and Dr. Mare Ferrari 06/04/15 as previously scheduled.    Signed, Vesta Mixer, PA-C, MHS 03/23/2015 7:35 AM    [facility] [place], [place], [place]  [address] Phone: [phone]; Fax: [phone]

## 2015-03-27 ENCOUNTER — Emergency Department (HOSPITAL_BASED_OUTPATIENT_CLINIC_OR_DEPARTMENT_OTHER)
Admission: EM | Admit: 2015-03-27 | Discharge: 2015-03-27 | Disposition: A | Discharge status: Home / Self Care | Payer: Medicare Other | Attending: Emergency Medicine | Admitting: Emergency Medicine

## 2015-03-27 ENCOUNTER — Encounter (HOSPITAL_BASED_OUTPATIENT_CLINIC_OR_DEPARTMENT_OTHER): Payer: Self-pay | Admitting: *Deleted

## 2015-03-27 ENCOUNTER — Emergency Department (HOSPITAL_BASED_OUTPATIENT_CLINIC_OR_DEPARTMENT_OTHER): Payer: Medicare Other

## 2015-03-27 DIAGNOSIS — Y998 Other external cause status: Secondary | ICD-10-CM | POA: Insufficient documentation

## 2015-03-27 DIAGNOSIS — S86812A Strain of other muscle(s) and tendon(s) at lower leg level, left leg, initial encounter: Secondary | ICD-10-CM | POA: Insufficient documentation

## 2015-03-27 DIAGNOSIS — I2510 Atherosclerotic heart disease of native coronary artery without angina pectoris: Secondary | ICD-10-CM | POA: Insufficient documentation

## 2015-03-27 DIAGNOSIS — Y9389 Activity, other specified: Secondary | ICD-10-CM | POA: Insufficient documentation

## 2015-03-27 DIAGNOSIS — S8392XA Sprain of unspecified site of left knee, initial encounter: Secondary | ICD-10-CM

## 2015-03-27 DIAGNOSIS — I10 Essential (primary) hypertension: Secondary | ICD-10-CM | POA: Diagnosis not present

## 2015-03-27 DIAGNOSIS — Z8669 Personal history of other diseases of the nervous system and sense organs: Secondary | ICD-10-CM | POA: Diagnosis not present

## 2015-03-27 DIAGNOSIS — Z79899 Other long term (current) drug therapy: Secondary | ICD-10-CM | POA: Diagnosis not present

## 2015-03-27 DIAGNOSIS — S8992XA Unspecified injury of left lower leg, initial encounter: Secondary | ICD-10-CM | POA: Diagnosis present

## 2015-03-27 DIAGNOSIS — S838X2A Sprain of other specified parts of left knee, initial encounter: Secondary | ICD-10-CM | POA: Insufficient documentation

## 2015-03-27 DIAGNOSIS — Z98891 History of uterine scar from previous surgery: Secondary | ICD-10-CM | POA: Insufficient documentation

## 2015-03-27 DIAGNOSIS — Z7902 Long term (current) use of antithrombotics/antiplatelets: Secondary | ICD-10-CM | POA: Diagnosis not present

## 2015-03-27 DIAGNOSIS — Z8546 Personal history of malignant neoplasm of prostate: Secondary | ICD-10-CM | POA: Insufficient documentation

## 2015-03-27 DIAGNOSIS — I4891 Unspecified atrial fibrillation: Secondary | ICD-10-CM | POA: Diagnosis not present

## 2015-03-27 DIAGNOSIS — S8982XA Other specified injuries of left lower leg, initial encounter: Secondary | ICD-10-CM | POA: Diagnosis not present

## 2015-03-27 DIAGNOSIS — Z8639 Personal history of other endocrine, nutritional and metabolic disease: Secondary | ICD-10-CM | POA: Diagnosis not present

## 2015-03-27 DIAGNOSIS — Z9861 Coronary angioplasty status: Secondary | ICD-10-CM | POA: Diagnosis not present

## 2015-03-27 DIAGNOSIS — Y9289 Other specified places as the place of occurrence of the external cause: Secondary | ICD-10-CM | POA: Insufficient documentation

## 2015-03-27 DIAGNOSIS — W1839XA Other fall on same level, initial encounter: Secondary | ICD-10-CM | POA: Diagnosis not present

## 2015-03-27 DIAGNOSIS — M25562 Pain in left knee: Secondary | ICD-10-CM | POA: Diagnosis not present

## 2015-03-27 NOTE — ED Notes (Signed)
States he was getting out of the car yesterday and felt a sharp pain before his left knee gave out. He was able to get into the house. Pain continues today.

## 2015-03-27 NOTE — Discharge Instructions (Signed)

## 2015-03-27 NOTE — ED Provider Notes (Signed)
CSN: [id number]     Arrival date & time 03/27/15  1142 History   First MD Initiated Contact with Patient 03/27/15 1152     Chief Complaint  Patient presents with  . Knee Pain     (Consider location/radiation/quality/duration/timing/severity/associated sxs/prior Treatment) HPI Comments: Patient presents with pain to his left knee. He states he was stepping down on it yesterday and had a sharp pain to his left knee which caused him to fall down on both of his knees. He states the pain is a little bit better today but still hurts when he walks on it. He's had a prior meniscal tear in the left knee. He sees Dr. Latanya Maudlin for his orthopedic injuries. He denies any other injuries from the fall. He hasn't taken any medications for symptomatic relief. He states only hurts when he is up walking on it.  Patient is a 81 y.o. male presenting with knee pain.  Knee Pain Associated symptoms: no back pain, no fever and no neck pain     Past Medical History  Diagnosis Date  . Dyslipidemia     a. Intol of statins.  . Lung nodule     , right upper lobe  . Obstructive sleep apnea   . Prostate cancer ([place])   . Amputated finger     a. L index d/t to dog bite.  . Concussion   . Coronary artery disease 09/2004, 04/2005, 02/2015    a. Stent to prox and mid RCA 08/2001. b. DES to RCA for ISR 08/2004. c. DES to [facility] for ISR 05/2005. d. Low risk nuc 10/2013 (done for CP in setting of new AF). e. PCI to the mid-RCA for in-stent restenosis with cutting balloon angioplasty f. PCI to an OM2 lesion  . Hypertension   . RBBB (right bundle branch block)   . Atrial fibrillation with RVR ([place])     a. New onset diagnosed 11/20/2013, spont converted to NSR.  Marland Kitchen OSA (obstructive sleep apnea)    Past Surgical History  Procedure Laterality Date  . Coronary stent placement    . Prostatectomy    . L knee ligament replacement    . Anterior cruciate ligament repair Right   . Tonsillectomy    . Tooth extraction    . Cardiac  catheterization N/A 03/10/2015    Procedure: Left Heart Cath and Coronary Angiography;  Surgeon: Lorretta Harp, MD;  Location: [place];  Service: Cardiovascular;  Laterality: N/A;  . Cardiac catheterization  03/13/2015    Procedure: Coronary Stent Intervention;  Surgeon: Peter M Martinique, MD;  Location: [place] [place] [facility];  Service: Cardiovascular;;  . Cardiac catheterization  03/13/2015    Procedure: Intravascular Pressure Wire/FFR Study;  Surgeon: Peter M Martinique, MD;  Location: [place] [facility];  Service: Cardiovascular;;   Family History  Problem Relation Age of Onset  . Emphysema Mother   . Cancer Father    Social History  Substance Use Topics  . Smoking status: Never Smoker   . Smokeless tobacco: Never Used  . Alcohol Use: No    Review of Systems  Constitutional: Negative for fever.  Gastrointestinal: Negative for nausea and vomiting.  Musculoskeletal: Positive for joint swelling and arthralgias. Negative for back pain and neck pain.  Skin: Negative for wound.  Neurological: Negative for weakness, numbness and headaches.      Allergies  Statins and Zetia  Home Medications   Prior to Admission medications   Medication Sig Start Date End Date Taking?  Authorizing Provider  amLODipine (NORVASC) 5 MG tablet Take 1 tablet (5 mg total) by mouth daily. 03/15/15   Brett Canales, PA-C  apixaban (ELIQUIS) 5 MG TABS tablet Take 1 tablet (5 mg total) by mouth 2 (two) times daily. 09/21/14   Sueanne Margarita, MD  aspirin 81 MG EC tablet Take 1 tablet (81 mg total) by mouth daily. 03/14/15 06/13/15  Erlene Quan, PA-C  clopidogrel (PLAVIX) 75 MG tablet Take 1 tablet (75 mg total) by mouth daily. 03/15/15   Brett Canales, PA-C  metoprolol succinate (TOPROL-XL) 25 MG 24 hr tablet Take 3 tablets (75 mg total) by mouth daily. 03/15/15   Brett Canales, PA-C  Multiple Vitamins-Minerals (PRESERVISION AREDS) CAPS Take 1 capsule by mouth 2 (two) times daily.    Historical Provider, MD   nitroGLYCERIN (NITROSTAT) 0.4 MG SL tablet PLACE ONE TABLET UNDER THE TONGUE EVERY 5 MINUTES AS NEEDED FOR CHEST PAIN 02/27/15   Darlin Coco, MD  NON FORMULARY as directed. tocotrienols are vitamin E supplement, Lutein-zeaxanthis, and piracetam    Historical Provider, MD  Piracetam POWD Take 2 scoop by mouth daily.     Historical Provider, MD  Psyllium (METAMUCIL PO) Take 15 mLs by mouth 2 (two) times daily. Mix in water and drink    Historical Provider, MD  Ubiquinol 100 MG CAPS Take 100 mg by mouth 2 (two) times daily.     Historical Provider, MD  VIGAMOX 0.5 % ophthalmic solution INSTILL ONE DROP INTO LEFT EYE 4 TIMES A DAY FOR 2 DAYS AFTER EACH MONTHLY EYE INJECTION 02/27/15   Historical Provider, MD   BP 145/75 mmHg  Pulse 81  Temp(Src) 98.4 F (36.9 C) (Oral)  Resp 18  Ht 6' (1.829 m)  Wt 200 lb (90.719 kg)  BMI 27.12 kg/m2  SpO2 99% Physical Exam  Constitutional: He is oriented to person, place, and time. He appears well-developed and well-nourished.  HENT:  Head: Normocephalic and atraumatic.  Neck: Normal range of motion. Neck supple.  Cardiovascular: Normal rate.   Pulmonary/Chest: Effort normal.  Musculoskeletal: He exhibits edema and tenderness.  Mild swelling to left knee, +mild pain on ROM, but no palpable tenderness. There is no pain to the hip or the ankle. Pedal pulses are intact. No gross ligament instability  Neurological: He is alert and oriented to person, place, and time.  Skin: Skin is warm and dry.  Psychiatric: He has a normal mood and affect.    ED Course  Procedures (including critical care time) Labs Review Labs Reviewed - No data to display  Imaging Review Dg Knee Complete 4 Views Left  03/27/2015   CLINICAL DATA:  One day history of knee pain after misstep  EXAM: LEFT KNEE - COMPLETE 4+ VIEW  COMPARISON:  November 16, 2005  FINDINGS: Frontal, lateral, and bilateral oblique views were obtained. A small focus of calcification is noted slightly medial  to the medial intercondylar tubercle. A small avulsion in this area must be of concern ; this finding was not present previously. No other evidence of fracture. No dislocation. There is a small joint effusion. There is mild narrowing of the patellofemoral joint. Medial lateral compartment joint spaces appear normal.  IMPRESSION: Small calcification medial to the medial intercondylar tubercle, concerning for small avulsion in this region. No other evidence of fracture. Small joint effusion. Mild narrowing of the patellofemoral joint.   Electronically Signed   By: Lowella Grip III M.D.   On: 03/27/2015 12:40  I have personally reviewed and evaluated these images and lab results as part of my medical decision-making.   EKG Interpretation None      MDM   Final diagnoses:  Knee sprain and strain, left, initial encounter    Patient has a possible small avulsion off the intercondylar tubercle. This could represent a ligamentous injury. I don't feel any gross instability of the knee. A knee sleeve was placed. Patient is advised in ice and elevation. He will use Tylenol for pain. He will follow-up with his orthopedist.    Malvin Johns, MD 03/27/15 1251

## 2015-03-27 NOTE — ED Notes (Signed)
Patient transported to x-ray. ?

## 2015-03-28 ENCOUNTER — Telehealth (HOSPITAL_COMMUNITY): Payer: Self-pay | Admitting: *Deleted

## 2015-03-29 DIAGNOSIS — M25562 Pain in left knee: Secondary | ICD-10-CM | POA: Diagnosis not present

## 2015-03-30 ENCOUNTER — Ambulatory Visit (HOSPITAL_COMMUNITY): Payer: Private Health Insurance - Indemnity

## 2015-03-31 DIAGNOSIS — Z8673 Personal history of transient ischemic attack (TIA), and cerebral infarction without residual deficits: Secondary | ICD-10-CM | POA: Diagnosis not present

## 2015-03-31 DIAGNOSIS — I2510 Atherosclerotic heart disease of native coronary artery without angina pectoris: Secondary | ICD-10-CM | POA: Diagnosis not present

## 2015-04-03 ENCOUNTER — Ambulatory Visit: Payer: Medicare Other | Admitting: Physical Therapy

## 2015-04-03 ENCOUNTER — Ambulatory Visit (HOSPITAL_COMMUNITY): Payer: Private Health Insurance - Indemnity

## 2015-04-05 ENCOUNTER — Ambulatory Visit (HOSPITAL_COMMUNITY): Payer: Private Health Insurance - Indemnity

## 2015-04-07 ENCOUNTER — Ambulatory Visit (HOSPITAL_COMMUNITY): Payer: Private Health Insurance - Indemnity

## 2015-04-10 ENCOUNTER — Ambulatory Visit (HOSPITAL_COMMUNITY): Payer: Private Health Insurance - Indemnity

## 2015-04-12 ENCOUNTER — Ambulatory Visit (HOSPITAL_COMMUNITY): Payer: Private Health Insurance - Indemnity

## 2015-04-14 ENCOUNTER — Ambulatory Visit (HOSPITAL_COMMUNITY): Payer: Private Health Insurance - Indemnity

## 2015-04-17 ENCOUNTER — Ambulatory Visit (HOSPITAL_COMMUNITY): Payer: Private Health Insurance - Indemnity

## 2015-04-17 DIAGNOSIS — M25562 Pain in left knee: Secondary | ICD-10-CM | POA: Diagnosis not present

## 2015-04-19 ENCOUNTER — Ambulatory Visit (HOSPITAL_COMMUNITY): Payer: Private Health Insurance - Indemnity

## 2015-04-19 ENCOUNTER — Encounter: Payer: Self-pay | Admitting: Cardiology

## 2015-04-21 ENCOUNTER — Ambulatory Visit (HOSPITAL_COMMUNITY): Payer: Private Health Insurance - Indemnity

## 2015-04-21 ENCOUNTER — Other Ambulatory Visit: Payer: Self-pay | Admitting: *Deleted

## 2015-04-21 MED ORDER — APIXABAN 5 MG PO TABS: 5.0000 mg | ORAL_TABLET | Freq: Two times a day (BID) | ORAL | Status: DC

## 2015-04-24 ENCOUNTER — Encounter (INDEPENDENT_AMBULATORY_CARE_PROVIDER_SITE_OTHER): Payer: Medicare Other | Admitting: Ophthalmology

## 2015-04-24 ENCOUNTER — Ambulatory Visit (HOSPITAL_COMMUNITY): Payer: Private Health Insurance - Indemnity

## 2015-04-25 ENCOUNTER — Ambulatory Visit: Payer: Medicare Other | Attending: Cardiology | Admitting: Physical Therapy

## 2015-04-25 ENCOUNTER — Encounter: Payer: Self-pay | Admitting: Physical Therapy

## 2015-04-25 DIAGNOSIS — R262 Difficulty in walking, not elsewhere classified: Secondary | ICD-10-CM

## 2015-04-25 DIAGNOSIS — R29898 Other symptoms and signs involving the musculoskeletal system: Secondary | ICD-10-CM | POA: Insufficient documentation

## 2015-04-25 DIAGNOSIS — R296 Repeated falls: Secondary | ICD-10-CM | POA: Insufficient documentation
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
## 2015-04-26 ENCOUNTER — Ambulatory Visit (HOSPITAL_COMMUNITY): Payer: Private Health Insurance - Indemnity

## 2015-04-26 ENCOUNTER — Other Ambulatory Visit: Payer: Self-pay | Admitting: Cardiology

## 2015-04-26 MED ORDER — APIXABAN 5 MG PO TABS: 5.0000 mg | ORAL_TABLET | Freq: Two times a day (BID) | ORAL | Status: DC

## 2015-04-26 NOTE — Telephone Encounter (Signed)
Pt's medication was resent to a different pharmacy, at pt's request.

## 2015-04-27 ENCOUNTER — Ambulatory Visit: Payer: Medicare Other | Admitting: Physical Therapy

## 2015-04-27 ENCOUNTER — Encounter (INDEPENDENT_AMBULATORY_CARE_PROVIDER_SITE_OTHER): Payer: Medicare Other | Admitting: Ophthalmology

## 2015-04-27 ENCOUNTER — Encounter: Payer: Self-pay | Admitting: Physical Therapy

## 2015-04-27 DIAGNOSIS — H35033 Hypertensive retinopathy, bilateral: Secondary | ICD-10-CM | POA: Diagnosis not present

## 2015-04-27 DIAGNOSIS — R296 Repeated falls: Secondary | ICD-10-CM | POA: Diagnosis not present

## 2015-04-27 DIAGNOSIS — H353231 Exudative age-related macular degeneration, bilateral, with active choroidal neovascularization: Secondary | ICD-10-CM | POA: Diagnosis not present

## 2015-04-27 DIAGNOSIS — H2513 Age-related nuclear cataract, bilateral: Secondary | ICD-10-CM

## 2015-04-27 DIAGNOSIS — R29898 Other symptoms and signs involving the musculoskeletal system: Secondary | ICD-10-CM

## 2015-04-27 DIAGNOSIS — I10 Essential (primary) hypertension: Secondary | ICD-10-CM

## 2015-04-27 DIAGNOSIS — H43813 Vitreous degeneration, bilateral: Secondary | ICD-10-CM | POA: Diagnosis not present

## 2015-04-27 DIAGNOSIS — R262 Difficulty in walking, not elsewhere classified: Secondary | ICD-10-CM
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
## 2015-04-28 ENCOUNTER — Ambulatory Visit (HOSPITAL_COMMUNITY): Payer: Private Health Insurance - Indemnity

## 2015-05-01 ENCOUNTER — Ambulatory Visit (HOSPITAL_COMMUNITY): Payer: Private Health Insurance - Indemnity

## 2015-05-01 ENCOUNTER — Ambulatory Visit: Payer: Medicare Other | Admitting: Physical Therapy

## 2015-05-01 ENCOUNTER — Encounter: Payer: Self-pay | Admitting: Physical Therapy

## 2015-05-01 DIAGNOSIS — R296 Repeated falls: Secondary | ICD-10-CM | POA: Diagnosis not present

## 2015-05-01 DIAGNOSIS — R29898 Other symptoms and signs involving the musculoskeletal system: Secondary | ICD-10-CM | POA: Diagnosis not present

## 2015-05-01 DIAGNOSIS — R262 Difficulty in walking, not elsewhere classified: Secondary | ICD-10-CM

## 2015-05-01 DIAGNOSIS — C61 Malignant neoplasm of prostate: Secondary | ICD-10-CM | POA: Diagnosis not present
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
## 2015-05-03 ENCOUNTER — Ambulatory Visit: Payer: Medicare Other | Admitting: Physical Therapy

## 2015-05-03 ENCOUNTER — Ambulatory Visit (HOSPITAL_COMMUNITY): Payer: Private Health Insurance - Indemnity

## 2015-05-03 ENCOUNTER — Encounter: Payer: Self-pay | Admitting: Physical Therapy

## 2015-05-03 DIAGNOSIS — R296 Repeated falls: Secondary | ICD-10-CM | POA: Diagnosis not present

## 2015-05-03 DIAGNOSIS — R29898 Other symptoms and signs involving the musculoskeletal system: Secondary | ICD-10-CM

## 2015-05-03 DIAGNOSIS — R262 Difficulty in walking, not elsewhere classified: Secondary | ICD-10-CM
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
## 2015-05-04 ENCOUNTER — Encounter (HOSPITAL_COMMUNITY)
Admission: RE | Admit: 2015-05-04 | Discharge: 2015-05-04 | Disposition: A | Discharge status: Home / Self Care | Payer: Medicare Other | Source: Ambulatory Visit | Attending: Cardiology | Admitting: Cardiology

## 2015-05-04 DIAGNOSIS — I213 ST elevation (STEMI) myocardial infarction of unspecified site: Secondary | ICD-10-CM | POA: Insufficient documentation

## 2015-05-04 DIAGNOSIS — Z955 Presence of coronary angioplasty implant and graft: Secondary | ICD-10-CM | POA: Insufficient documentation

## 2015-05-04 NOTE — Progress Notes (Signed)
Cardiac Rehab Medication Review by a Pharmacist  Does the patient  feel that his/her medications are working for him/her?  yes  Has the patient been experiencing any side effects to the medications prescribed?  no  Does the patient measure his/her own blood pressure or blood glucose at home?  no   Does the patient have any problems obtaining medications due to transportation or finances?   no  Understanding of regimen: excellent Understanding of indications: good Potential of compliance: excellent    Pharmacist comments: Medications managed by wife. Expressed concern of bleeding risk on Eliquis. Discussed indication and briefly explainedrisks and benefits of anticoagulation in AFib for stroke prevention.    Dominic [name] 05/04/2015 8:50 AM

## 2015-05-05 ENCOUNTER — Ambulatory Visit (HOSPITAL_COMMUNITY): Payer: Private Health Insurance - Indemnity

## 2015-05-08 ENCOUNTER — Ambulatory Visit (HOSPITAL_COMMUNITY): Payer: Private Health Insurance - Indemnity

## 2015-05-08 ENCOUNTER — Encounter (HOSPITAL_COMMUNITY)
Admission: RE | Admit: 2015-05-08 | Discharge: 2015-05-08 | Disposition: A | Discharge status: Home / Self Care | Payer: Medicare Other | Source: Ambulatory Visit | Attending: Cardiology | Admitting: Cardiology

## 2015-05-08 DIAGNOSIS — Z955 Presence of coronary angioplasty implant and graft: Secondary | ICD-10-CM | POA: Diagnosis not present

## 2015-05-08 DIAGNOSIS — I213 ST elevation (STEMI) myocardial infarction of unspecified site: Secondary | ICD-10-CM | POA: Diagnosis not present

## 2015-05-08 NOTE — Progress Notes (Signed)
Pt started cardiac rehab today.  Pt tolerated light exercise without difficulty. VSS, telemetry-Sinus Rhtyhm, asymptomatic.  Medication list reconciled.  Pt verbalized compliance with medications and denies barriers to compliance. PSYCHOSOCIAL ASSESSMENT:  PHQ-0. Pt exhibits positive coping skills, hopeful outlook with supportive family. No psychosocial needs identified at this time, no psychosocial interventions necessary.    Pt enjoys watching TV.   Pt cardiac rehab  goal is  to increase strength and steadiness.  Pt encouraged to participate in functional fitness to increase ability to achieve these goals.   Pt long term cardiac rehab goal is increase strength.  Pt oriented to exercise equipment and routine.  Understanding verbalized. Dominic [name] and his wife are on a plant based diet now and reported that he and his wife have lost weight since they changed their diet.

## 2015-05-09 ENCOUNTER — Encounter: Payer: Self-pay | Admitting: Physical Therapy

## 2015-05-09 ENCOUNTER — Ambulatory Visit: Payer: Medicare Other | Admitting: Physical Therapy

## 2015-05-09 DIAGNOSIS — R296 Repeated falls: Secondary | ICD-10-CM

## 2015-05-09 DIAGNOSIS — R262 Difficulty in walking, not elsewhere classified: Secondary | ICD-10-CM | POA: Diagnosis not present

## 2015-05-09 DIAGNOSIS — R29898 Other symptoms and signs involving the musculoskeletal system: Secondary | ICD-10-CM | POA: Diagnosis not present
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
## 2015-05-10 ENCOUNTER — Ambulatory Visit (HOSPITAL_COMMUNITY): Payer: Private Health Insurance - Indemnity

## 2015-05-10 ENCOUNTER — Encounter (HOSPITAL_COMMUNITY)
Admission: RE | Admit: 2015-05-10 | Discharge: 2015-05-10 | Disposition: A | Discharge status: Home / Self Care | Payer: Medicare Other | Source: Ambulatory Visit | Attending: Cardiology | Admitting: Cardiology

## 2015-05-10 ENCOUNTER — Telehealth: Payer: Self-pay | Admitting: Cardiology

## 2015-05-10 DIAGNOSIS — Z955 Presence of coronary angioplasty implant and graft: Secondary | ICD-10-CM | POA: Diagnosis not present

## 2015-05-10 DIAGNOSIS — I213 ST elevation (STEMI) myocardial infarction of unspecified site: Secondary | ICD-10-CM | POA: Diagnosis not present

## 2015-05-10 NOTE — Telephone Encounter (Signed)
New problem   Pt need a note to be faxed to work when his next appt is with Dr Mare Ferrari. Please fax to Attn: Peggy May [phone]

## 2015-05-15 ENCOUNTER — Ambulatory Visit (HOSPITAL_COMMUNITY): Payer: Private Health Insurance - Indemnity

## 2015-05-15 ENCOUNTER — Telehealth: Payer: Self-pay | Admitting: Cardiology

## 2015-05-15 ENCOUNTER — Encounter (HOSPITAL_COMMUNITY)
Admission: RE | Admit: 2015-05-15 | Discharge: 2015-05-15 | Disposition: A | Discharge status: Home / Self Care | Payer: Medicare Other | Source: Ambulatory Visit | Attending: Cardiology | Admitting: Cardiology

## 2015-05-15 DIAGNOSIS — Z955 Presence of coronary angioplasty implant and graft: Secondary | ICD-10-CM | POA: Diagnosis not present

## 2015-05-15 DIAGNOSIS — I213 ST elevation (STEMI) myocardial infarction of unspecified site: Secondary | ICD-10-CM | POA: Diagnosis not present

## 2015-05-15 NOTE — Telephone Encounter (Signed)
Left message to call back  

## 2015-05-15 NOTE — Telephone Encounter (Signed)
New message    Wife calling patient need a note for HR when his next appt with Dr. Mare Ferrari     Attention Peggy May fax [phone]

## 2015-05-15 NOTE — Progress Notes (Signed)
Reviewed home exercise with pt today.  Pt plans to continue walking at home for exercise.  He is also currently attending balance training classes for his hip as well.  Reviewed THR, pulse (needs practice, class on Friday), RPE, sign and symptoms, NTG use, and when to call 911 or MD.  Pt voiced understanding. Alberteen Sam, MA, ACSM RCEP

## 2015-05-16 ENCOUNTER — Ambulatory Visit: Payer: Medicare Other | Admitting: Physical Therapy

## 2015-05-17 ENCOUNTER — Encounter (HOSPITAL_COMMUNITY)
Admission: RE | Admit: 2015-05-17 | Discharge: 2015-05-17 | Disposition: A | Discharge status: Home / Self Care | Payer: Medicare Other | Source: Ambulatory Visit | Attending: Cardiology | Admitting: Cardiology

## 2015-05-17 ENCOUNTER — Ambulatory Visit (HOSPITAL_COMMUNITY): Payer: Private Health Insurance - Indemnity

## 2015-05-17 DIAGNOSIS — I213 ST elevation (STEMI) myocardial infarction of unspecified site: Secondary | ICD-10-CM | POA: Diagnosis not present

## 2015-05-17 DIAGNOSIS — Z955 Presence of coronary angioplasty implant and graft: Secondary | ICD-10-CM | POA: Diagnosis not present

## 2015-05-18 ENCOUNTER — Encounter: Payer: Self-pay | Admitting: Physical Therapy

## 2015-05-18 ENCOUNTER — Ambulatory Visit: Payer: Medicare Other | Attending: Cardiology | Admitting: Physical Therapy

## 2015-05-18 DIAGNOSIS — R296 Repeated falls: Secondary | ICD-10-CM | POA: Diagnosis not present

## 2015-05-18 DIAGNOSIS — R262 Difficulty in walking, not elsewhere classified: Secondary | ICD-10-CM

## 2015-05-18 DIAGNOSIS — R29898 Other symptoms and signs involving the musculoskeletal system: Secondary | ICD-10-CM | POA: Diagnosis not present
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
## 2015-05-19 ENCOUNTER — Encounter (HOSPITAL_COMMUNITY)
Admission: RE | Admit: 2015-05-19 | Discharge: 2015-05-19 | Disposition: A | Discharge status: Home / Self Care | Payer: Medicare Other | Source: Ambulatory Visit | Attending: Cardiology | Admitting: Cardiology

## 2015-05-19 ENCOUNTER — Ambulatory Visit (HOSPITAL_COMMUNITY): Payer: Private Health Insurance - Indemnity

## 2015-05-19 DIAGNOSIS — I213 ST elevation (STEMI) myocardial infarction of unspecified site: Secondary | ICD-10-CM | POA: Insufficient documentation

## 2015-05-19 DIAGNOSIS — Z955 Presence of coronary angioplasty implant and graft: Secondary | ICD-10-CM | POA: Insufficient documentation

## 2015-05-19 NOTE — Telephone Encounter (Signed)
Tried to fax for 2 days with no answer. Left message for P May to call back to verify number or offer another one

## 2015-05-19 NOTE — Telephone Encounter (Signed)
Dominic [name] at 05/19/2015 4:17 PM     Status: Signed       Expand All Collapse All   Tried to fax for 2 days with no answer. Left message for P May to call back to verify number or offer another one

## 2015-05-22 ENCOUNTER — Ambulatory Visit (HOSPITAL_COMMUNITY): Payer: Private Health Insurance - Indemnity

## 2015-05-22 ENCOUNTER — Encounter (HOSPITAL_COMMUNITY)
Admission: RE | Admit: 2015-05-22 | Discharge: 2015-05-22 | Disposition: A | Discharge status: Home / Self Care | Payer: Medicare Other | Source: Ambulatory Visit | Attending: Cardiology | Admitting: Cardiology

## 2015-05-22 DIAGNOSIS — N401 Enlarged prostate with lower urinary tract symptoms: Secondary | ICD-10-CM | POA: Diagnosis not present

## 2015-05-22 DIAGNOSIS — C61 Malignant neoplasm of prostate: Secondary | ICD-10-CM | POA: Diagnosis not present

## 2015-05-22 DIAGNOSIS — R338 Other retention of urine: Secondary | ICD-10-CM | POA: Diagnosis not present

## 2015-05-22 DIAGNOSIS — I213 ST elevation (STEMI) myocardial infarction of unspecified site: Secondary | ICD-10-CM | POA: Diagnosis not present

## 2015-05-22 DIAGNOSIS — Z955 Presence of coronary angioplasty implant and graft: Secondary | ICD-10-CM | POA: Diagnosis not present

## 2015-05-23 ENCOUNTER — Encounter: Payer: Self-pay | Admitting: Physical Therapy

## 2015-05-23 ENCOUNTER — Ambulatory Visit: Payer: Medicare Other | Admitting: Physical Therapy

## 2015-05-23 DIAGNOSIS — R29898 Other symptoms and signs involving the musculoskeletal system: Secondary | ICD-10-CM | POA: Diagnosis not present

## 2015-05-23 DIAGNOSIS — R262 Difficulty in walking, not elsewhere classified: Secondary | ICD-10-CM

## 2015-05-23 DIAGNOSIS — R296 Repeated falls: Secondary | ICD-10-CM | POA: Diagnosis not present

## 2015-05-23 NOTE — Telephone Encounter (Signed)
Spoke with Peggy May at patients employment [phone]) and got another fax number [phone]) Faxed and Vickii Chafe received, wife aware
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
## 2015-05-23 NOTE — Telephone Encounter (Signed)
Follow up      Calling to let nurse know employer still has not received note.  Wife needs to come by today and pick up note.  Please call and let her know if she can come to [place] or where to pick up note.  She need to take it to the employer today.

## 2015-05-24 ENCOUNTER — Telehealth (HOSPITAL_COMMUNITY): Payer: Self-pay | Admitting: Family Medicine

## 2015-05-24 ENCOUNTER — Encounter (HOSPITAL_COMMUNITY): Payer: Medicare Other

## 2015-05-24 ENCOUNTER — Ambulatory Visit (HOSPITAL_COMMUNITY): Payer: Private Health Insurance - Indemnity

## 2015-05-25 ENCOUNTER — Other Ambulatory Visit (INDEPENDENT_AMBULATORY_CARE_PROVIDER_SITE_OTHER): Payer: Medicare Other

## 2015-05-25 ENCOUNTER — Ambulatory Visit: Payer: Private Health Insurance - Indemnity | Admitting: Cardiology

## 2015-05-25 ENCOUNTER — Ambulatory Visit: Payer: Medicare Other | Admitting: Physical Therapy

## 2015-05-25 DIAGNOSIS — I10 Essential (primary) hypertension: Secondary | ICD-10-CM

## 2015-05-26 ENCOUNTER — Encounter (HOSPITAL_COMMUNITY): Payer: Medicare Other

## 2015-05-26 ENCOUNTER — Ambulatory Visit (HOSPITAL_COMMUNITY): Payer: Private Health Insurance - Indemnity

## 2015-05-26 LAB — BASIC METABOLIC PANEL
BUN: 16 mg/dL (ref 7–25)
CHLORIDE: 104 mmol/L (ref 98–110)
CO2: 29 mmol/L (ref 20–31)
Calcium: 9.7 mg/dL (ref 8.6–10.3)
Creat: 0.82 mg/dL (ref 0.70–1.18)
GLUCOSE: 93 mg/dL (ref 65–99)
POTASSIUM: 4.1 mmol/L (ref 3.5–5.3)
Sodium: 140 mmol/L (ref 135–146)

## 2015-05-26 NOTE — Progress Notes (Signed)
Quick Note:  Please report to patient. The recent labs are stable. Continue same medication and careful diet. ______ 

## 2015-05-29 ENCOUNTER — Telehealth (HOSPITAL_COMMUNITY): Payer: Self-pay | Admitting: Family Medicine

## 2015-05-29 ENCOUNTER — Ambulatory Visit (HOSPITAL_COMMUNITY): Payer: Private Health Insurance - Indemnity

## 2015-05-29 ENCOUNTER — Encounter (HOSPITAL_COMMUNITY): Payer: Medicare Other

## 2015-05-29 DIAGNOSIS — H401223 Low-tension glaucoma, left eye, severe stage: Secondary | ICD-10-CM | POA: Diagnosis not present

## 2015-05-29 DIAGNOSIS — H401212 Low-tension glaucoma, right eye, moderate stage: Secondary | ICD-10-CM | POA: Diagnosis not present

## 2015-05-30 ENCOUNTER — Encounter: Payer: Self-pay | Admitting: Physical Therapy

## 2015-05-30 ENCOUNTER — Ambulatory Visit: Payer: Medicare Other | Admitting: Physical Therapy

## 2015-05-30 ENCOUNTER — Ambulatory Visit: Payer: Private Health Insurance - Indemnity | Admitting: Cardiology

## 2015-05-30 DIAGNOSIS — R29898 Other symptoms and signs involving the musculoskeletal system: Secondary | ICD-10-CM | POA: Diagnosis not present

## 2015-05-30 DIAGNOSIS — R262 Difficulty in walking, not elsewhere classified: Secondary | ICD-10-CM | POA: Diagnosis not present

## 2015-05-30 DIAGNOSIS — R296 Repeated falls: Secondary | ICD-10-CM | POA: Diagnosis not present
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
## 2015-05-31 ENCOUNTER — Encounter (HOSPITAL_COMMUNITY)
Admission: RE | Admit: 2015-05-31 | Discharge: 2015-05-31 | Disposition: A | Discharge status: Home / Self Care | Payer: Medicare Other | Source: Ambulatory Visit | Attending: Cardiology | Admitting: Cardiology

## 2015-05-31 ENCOUNTER — Ambulatory Visit (HOSPITAL_COMMUNITY): Payer: Private Health Insurance - Indemnity

## 2015-05-31 DIAGNOSIS — Z955 Presence of coronary angioplasty implant and graft: Secondary | ICD-10-CM | POA: Diagnosis not present

## 2015-05-31 DIAGNOSIS — I213 ST elevation (STEMI) myocardial infarction of unspecified site: Secondary | ICD-10-CM | POA: Diagnosis not present

## 2015-06-01 ENCOUNTER — Ambulatory Visit: Payer: Medicare Other | Admitting: Physical Therapy

## 2015-06-01 DIAGNOSIS — R29898 Other symptoms and signs involving the musculoskeletal system: Secondary | ICD-10-CM | POA: Diagnosis not present

## 2015-06-01 DIAGNOSIS — R262 Difficulty in walking, not elsewhere classified: Secondary | ICD-10-CM | POA: Diagnosis not present

## 2015-06-01 DIAGNOSIS — R296 Repeated falls: Secondary | ICD-10-CM | POA: Diagnosis not present
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
## 2015-06-02 ENCOUNTER — Encounter (HOSPITAL_COMMUNITY)
Admission: RE | Admit: 2015-06-02 | Discharge: 2015-06-02 | Disposition: A | Discharge status: Home / Self Care | Payer: Medicare Other | Source: Ambulatory Visit | Attending: Cardiology | Admitting: Cardiology

## 2015-06-02 ENCOUNTER — Ambulatory Visit (HOSPITAL_COMMUNITY): Payer: Private Health Insurance - Indemnity

## 2015-06-02 DIAGNOSIS — I213 ST elevation (STEMI) myocardial infarction of unspecified site: Secondary | ICD-10-CM | POA: Diagnosis not present

## 2015-06-02 DIAGNOSIS — Z955 Presence of coronary angioplasty implant and graft: Secondary | ICD-10-CM | POA: Diagnosis not present

## 2015-06-05 ENCOUNTER — Encounter (HOSPITAL_COMMUNITY)
Admission: RE | Admit: 2015-06-05 | Discharge: 2015-06-05 | Disposition: A | Discharge status: Home / Self Care | Payer: Medicare Other | Source: Ambulatory Visit | Attending: Cardiology | Admitting: Cardiology

## 2015-06-05 ENCOUNTER — Ambulatory Visit (HOSPITAL_COMMUNITY): Payer: Private Health Insurance - Indemnity

## 2015-06-05 DIAGNOSIS — I213 ST elevation (STEMI) myocardial infarction of unspecified site: Secondary | ICD-10-CM | POA: Diagnosis not present

## 2015-06-05 DIAGNOSIS — Z955 Presence of coronary angioplasty implant and graft: Secondary | ICD-10-CM | POA: Diagnosis not present

## 2015-06-06 ENCOUNTER — Encounter: Payer: Self-pay | Admitting: Physical Therapy

## 2015-06-06 ENCOUNTER — Ambulatory Visit: Payer: Medicare Other | Admitting: Physical Therapy

## 2015-06-06 DIAGNOSIS — R296 Repeated falls: Secondary | ICD-10-CM | POA: Diagnosis not present

## 2015-06-06 DIAGNOSIS — R262 Difficulty in walking, not elsewhere classified: Secondary | ICD-10-CM | POA: Diagnosis not present

## 2015-06-06 DIAGNOSIS — R29898 Other symptoms and signs involving the musculoskeletal system: Secondary | ICD-10-CM | POA: Diagnosis not present
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
## 2015-06-07 ENCOUNTER — Ambulatory Visit (HOSPITAL_COMMUNITY): Payer: Private Health Insurance - Indemnity

## 2015-06-07 ENCOUNTER — Encounter (HOSPITAL_COMMUNITY)
Admission: RE | Admit: 2015-06-07 | Discharge: 2015-06-07 | Disposition: A | Discharge status: Home / Self Care | Payer: Medicare Other | Source: Ambulatory Visit | Attending: Cardiology | Admitting: Cardiology

## 2015-06-07 DIAGNOSIS — Z955 Presence of coronary angioplasty implant and graft: Secondary | ICD-10-CM | POA: Diagnosis not present

## 2015-06-07 DIAGNOSIS — I213 ST elevation (STEMI) myocardial infarction of unspecified site: Secondary | ICD-10-CM | POA: Diagnosis not present

## 2015-06-08 ENCOUNTER — Encounter: Payer: Self-pay | Admitting: Physical Therapy

## 2015-06-08 ENCOUNTER — Ambulatory Visit: Payer: Medicare Other | Admitting: Physical Therapy

## 2015-06-08 DIAGNOSIS — R29898 Other symptoms and signs involving the musculoskeletal system: Secondary | ICD-10-CM

## 2015-06-08 DIAGNOSIS — R296 Repeated falls: Secondary | ICD-10-CM

## 2015-06-08 DIAGNOSIS — R262 Difficulty in walking, not elsewhere classified: Secondary | ICD-10-CM | POA: Diagnosis not present
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
## 2015-06-09 ENCOUNTER — Encounter (HOSPITAL_COMMUNITY)
Admission: RE | Admit: 2015-06-09 | Discharge: 2015-06-09 | Disposition: A | Discharge status: Home / Self Care | Payer: Medicare Other | Source: Ambulatory Visit | Attending: Cardiology | Admitting: Cardiology

## 2015-06-09 ENCOUNTER — Ambulatory Visit (HOSPITAL_COMMUNITY): Payer: Private Health Insurance - Indemnity

## 2015-06-09 DIAGNOSIS — I213 ST elevation (STEMI) myocardial infarction of unspecified site: Secondary | ICD-10-CM | POA: Diagnosis not present

## 2015-06-09 DIAGNOSIS — Z955 Presence of coronary angioplasty implant and graft: Secondary | ICD-10-CM | POA: Diagnosis not present

## 2015-06-13 ENCOUNTER — Ambulatory Visit: Payer: Medicare Other | Admitting: Physical Therapy

## 2015-06-13 ENCOUNTER — Encounter: Payer: Self-pay | Admitting: Physical Therapy

## 2015-06-13 DIAGNOSIS — R296 Repeated falls: Secondary | ICD-10-CM | POA: Diagnosis not present

## 2015-06-13 DIAGNOSIS — R29898 Other symptoms and signs involving the musculoskeletal system: Secondary | ICD-10-CM | POA: Diagnosis not present

## 2015-06-13 DIAGNOSIS — R262 Difficulty in walking, not elsewhere classified: Secondary | ICD-10-CM
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
## 2015-06-14 ENCOUNTER — Encounter (HOSPITAL_COMMUNITY)
Admission: RE | Admit: 2015-06-14 | Discharge: 2015-06-14 | Disposition: A | Discharge status: Home / Self Care | Payer: Medicare Other | Source: Ambulatory Visit | Attending: Cardiology | Admitting: Cardiology

## 2015-06-14 ENCOUNTER — Ambulatory Visit (HOSPITAL_COMMUNITY): Payer: Private Health Insurance - Indemnity

## 2015-06-14 DIAGNOSIS — Z955 Presence of coronary angioplasty implant and graft: Secondary | ICD-10-CM | POA: Diagnosis not present

## 2015-06-14 DIAGNOSIS — I213 ST elevation (STEMI) myocardial infarction of unspecified site: Secondary | ICD-10-CM | POA: Diagnosis not present

## 2015-06-15 ENCOUNTER — Ambulatory Visit: Payer: Medicare Other | Admitting: Physical Therapy

## 2015-06-15 ENCOUNTER — Encounter: Payer: Self-pay | Admitting: Physical Therapy

## 2015-06-15 DIAGNOSIS — R296 Repeated falls: Secondary | ICD-10-CM

## 2015-06-15 DIAGNOSIS — R29898 Other symptoms and signs involving the musculoskeletal system: Secondary | ICD-10-CM

## 2015-06-15 DIAGNOSIS — R262 Difficulty in walking, not elsewhere classified: Secondary | ICD-10-CM
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
## 2015-06-16 ENCOUNTER — Ambulatory Visit (HOSPITAL_COMMUNITY): Payer: Private Health Insurance - Indemnity

## 2015-06-16 ENCOUNTER — Encounter (HOSPITAL_COMMUNITY)
Admission: RE | Admit: 2015-06-16 | Discharge: 2015-06-16 | Disposition: A | Discharge status: Home / Self Care | Payer: Medicare Other | Source: Ambulatory Visit | Attending: Cardiology | Admitting: Cardiology

## 2015-06-16 DIAGNOSIS — Z955 Presence of coronary angioplasty implant and graft: Secondary | ICD-10-CM | POA: Diagnosis not present

## 2015-06-16 DIAGNOSIS — I213 ST elevation (STEMI) myocardial infarction of unspecified site: Secondary | ICD-10-CM | POA: Diagnosis not present

## 2015-06-19 ENCOUNTER — Encounter (HOSPITAL_COMMUNITY): Payer: Medicare Other

## 2015-06-19 DIAGNOSIS — Z955 Presence of coronary angioplasty implant and graft: Secondary | ICD-10-CM | POA: Insufficient documentation

## 2015-06-19 DIAGNOSIS — I213 ST elevation (STEMI) myocardial infarction of unspecified site: Secondary | ICD-10-CM | POA: Insufficient documentation

## 2015-06-20 ENCOUNTER — Ambulatory Visit: Payer: Medicare Other | Attending: Cardiology | Admitting: Physical Therapy

## 2015-06-20 ENCOUNTER — Encounter: Payer: Self-pay | Admitting: Physical Therapy

## 2015-06-20 DIAGNOSIS — R262 Difficulty in walking, not elsewhere classified: Secondary | ICD-10-CM | POA: Diagnosis not present

## 2015-06-20 DIAGNOSIS — R296 Repeated falls: Secondary | ICD-10-CM | POA: Diagnosis not present

## 2015-06-20 DIAGNOSIS — R29898 Other symptoms and signs involving the musculoskeletal system: Secondary | ICD-10-CM | POA: Diagnosis not present
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
## 2015-06-21 ENCOUNTER — Ambulatory Visit (HOSPITAL_COMMUNITY): Payer: Private Health Insurance - Indemnity

## 2015-06-21 ENCOUNTER — Encounter (HOSPITAL_COMMUNITY)
Admission: RE | Admit: 2015-06-21 | Discharge: 2015-06-21 | Disposition: A | Discharge status: Home / Self Care | Payer: Medicare Other | Source: Ambulatory Visit | Attending: Cardiology | Admitting: Cardiology

## 2015-06-21 DIAGNOSIS — I213 ST elevation (STEMI) myocardial infarction of unspecified site: Secondary | ICD-10-CM | POA: Diagnosis not present

## 2015-06-21 DIAGNOSIS — Z955 Presence of coronary angioplasty implant and graft: Secondary | ICD-10-CM | POA: Diagnosis not present

## 2015-06-22 ENCOUNTER — Ambulatory Visit: Payer: Medicare Other | Admitting: Physical Therapy

## 2015-06-22 ENCOUNTER — Encounter: Payer: Self-pay | Admitting: Physical Therapy

## 2015-06-22 ENCOUNTER — Encounter (INDEPENDENT_AMBULATORY_CARE_PROVIDER_SITE_OTHER): Payer: Medicare Other | Admitting: Ophthalmology

## 2015-06-22 DIAGNOSIS — R29898 Other symptoms and signs involving the musculoskeletal system: Secondary | ICD-10-CM | POA: Diagnosis not present

## 2015-06-22 DIAGNOSIS — R296 Repeated falls: Secondary | ICD-10-CM

## 2015-06-22 DIAGNOSIS — H43813 Vitreous degeneration, bilateral: Secondary | ICD-10-CM | POA: Diagnosis not present

## 2015-06-22 DIAGNOSIS — H35033 Hypertensive retinopathy, bilateral: Secondary | ICD-10-CM | POA: Diagnosis not present

## 2015-06-22 DIAGNOSIS — R262 Difficulty in walking, not elsewhere classified: Secondary | ICD-10-CM

## 2015-06-22 DIAGNOSIS — I10 Essential (primary) hypertension: Secondary | ICD-10-CM

## 2015-06-22 DIAGNOSIS — H353231 Exudative age-related macular degeneration, bilateral, with active choroidal neovascularization: Secondary | ICD-10-CM | POA: Diagnosis not present
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
## 2015-06-23 ENCOUNTER — Encounter (HOSPITAL_COMMUNITY)
Admission: RE | Admit: 2015-06-23 | Discharge: 2015-06-23 | Disposition: A | Discharge status: Home / Self Care | Payer: Medicare Other | Source: Ambulatory Visit | Attending: Cardiology | Admitting: Cardiology

## 2015-06-23 ENCOUNTER — Ambulatory Visit (HOSPITAL_COMMUNITY): Payer: Private Health Insurance - Indemnity

## 2015-06-23 DIAGNOSIS — I213 ST elevation (STEMI) myocardial infarction of unspecified site: Secondary | ICD-10-CM | POA: Diagnosis not present

## 2015-06-23 DIAGNOSIS — Z955 Presence of coronary angioplasty implant and graft: Secondary | ICD-10-CM | POA: Diagnosis not present

## 2015-06-26 ENCOUNTER — Encounter (HOSPITAL_COMMUNITY)
Admission: RE | Admit: 2015-06-26 | Discharge: 2015-06-26 | Disposition: A | Discharge status: Home / Self Care | Payer: Medicare Other | Source: Ambulatory Visit | Attending: Cardiology | Admitting: Cardiology

## 2015-06-26 ENCOUNTER — Ambulatory Visit (HOSPITAL_COMMUNITY): Payer: Private Health Insurance - Indemnity

## 2015-06-26 DIAGNOSIS — I213 ST elevation (STEMI) myocardial infarction of unspecified site: Secondary | ICD-10-CM | POA: Diagnosis not present

## 2015-06-26 DIAGNOSIS — Z955 Presence of coronary angioplasty implant and graft: Secondary | ICD-10-CM | POA: Diagnosis not present

## 2015-06-27 ENCOUNTER — Encounter: Payer: Self-pay | Admitting: Cardiology

## 2015-06-27 ENCOUNTER — Ambulatory Visit (INDEPENDENT_AMBULATORY_CARE_PROVIDER_SITE_OTHER): Payer: Medicare Other | Admitting: Cardiology

## 2015-06-27 VITALS — BP 118/82 | HR 63 | Ht 71.0 in | Wt 195.0 lb

## 2015-06-27 DIAGNOSIS — I4510 Unspecified right bundle-branch block: Secondary | ICD-10-CM

## 2015-06-27 DIAGNOSIS — Z9861 Coronary angioplasty status: Secondary | ICD-10-CM | POA: Diagnosis not present

## 2015-06-27 DIAGNOSIS — I10 Essential (primary) hypertension: Secondary | ICD-10-CM | POA: Diagnosis not present

## 2015-06-27 DIAGNOSIS — E785 Hyperlipidemia, unspecified: Secondary | ICD-10-CM

## 2015-06-27 DIAGNOSIS — I2510 Atherosclerotic heart disease of native coronary artery without angina pectoris: Secondary | ICD-10-CM | POA: Diagnosis not present

## 2015-06-27 DIAGNOSIS — I480 Paroxysmal atrial fibrillation: Secondary | ICD-10-CM | POA: Diagnosis not present

## 2015-06-27 NOTE — Progress Notes (Signed)
Cardiology Office Note   Date:  06/27/2015   ID:  Dominic [name], Dominic [name] 01-18-1934, MRN [id number]  PCP:   Melinda Crutch, MD    Chief Complaint  Patient presents with  . Coronary Artery Disease  . Hypertension  . Atrial Fibrillation      History of Present Illness: Dominic [name] is a 82 y.o. male w/ PMH of CAD s/p DES to RCA for ISR in 2006, HTN, OSA, HLD, OSA and PAF (s/p recent electrical cardioversion 02/21/15)-on Eliquis and NSTEMI complicated by CVA.   He has a hx of prior stenting with BMS to the RCA and subsequent Cypher DES to the RCA in 3/06. LHC in 12/06 demonstrated significant RCA ISR which was treated with a Taxus DES. His most recent Myoview was in June 2015 and was negative for ischemia. EF was 56%. EF by 2D echo was also normal at 55-60% and no WMA.   He was recently evaluated in the [facility] emergency department on 02/21/2015 with complaints of palpitations and near-syncope. He was found to be in recurrent atrial fibrillation with rapid ventricular response. This was apparently in the setting of hypokalemia with potassium level of 3.2. He was given potassium supplementation. He also required electrical cardioversion while in the emergency department. He was successfully converted to normal sinus rhythm and was discharged home from the ED without admission. He was seen in clinic by Dr. Mare Ferrari the following day for post ED follow-up. He was noted to still be in sinus rhythm  Most recent LDL was 166. H/o statin and zetia intolerance. Given his risk factors, need to consider retrial of statin. He apparently was evaluated for PCSK9 inhibitors in lipid clinic and deemed not a candidate.  He was admitted from 09/22- 03/15/2015 for NSTEMI. He initially presented with chest pain x1 week. His symptoms felt similar to his prior symptoms before undergoing PCI to the RCA. Stat troponin in the ED was abnormal at 0.42. His platelet count was also severely low at 99.  This was down from 188 2 weeks ago. Hgb was stable at 14.8 and K elevated at 6.8. He was admitted and started on IV heparin. Eliquis was held. It was felt that his high K was due to hemolyzed sample. Repeat K was normal.   He underwent cath on 03/10/2015 w/ successful PCI to the mid-RCA for in-stent restenosis with cutting balloon angioplasty and PCI to an OM2 lesion. There was residual LAD/D1 bifurcation lesion and staged PCI was done 03/13/2015.   Amlodipine 5mg  was added to regimen for hypertension  On the night of 03/14/15 the patient had dizziness. No focal deficits. Pt had ataix gait while walking with cardiac rehab. Seen by neurology and obtained MRI of brain which showed CVA in mult vascular territories suggesting a 'shower' effect from PCI. His neurological s/s has resolved. Patient was evaluated by PT -->Recomended outpatient PT to improve strength and flexibility.  He was discharged home on ASA 81, Plavix 75 and Eliquis. He says that he is now back to normal.  He is in cardiac rehab.  He denies any further chest pain, SOB, DOE, LE edema, dizziness, palpitations or syncope.     Past Medical History  Diagnosis Date  . Dyslipidemia     a. Intol of statins.  . Lung nodule     , right upper lobe  . Obstructive sleep apnea   . Prostate  cancer ([place])   . Amputated finger     a. L index d/t to dog bite.  . Concussion   . Coronary artery disease 09/2004, 04/2005, 02/2015    a. Stent to prox and mid RCA 08/2001. b. DES to RCA for ISR 08/2004. c. DES to [facility] for ISR 05/2005. d. Low risk nuc 10/2013 (done for CP in setting of new AF). e. PCI to the mid-RCA for in-stent restenosis with cutting balloon angioplasty f. PCI to an OM2 lesion  . Hypertension   . RBBB (right bundle branch block)   . Atrial fibrillation with RVR ([place])     a. New onset diagnosed 11/20/2013, spont converted to NSR.  Marland Kitchen OSA (obstructive sleep apnea)     Past Surgical History  Procedure Laterality Date  . Coronary stent  placement    . Prostatectomy    . L knee ligament replacement    . Anterior cruciate ligament repair Right   . Tonsillectomy    . Tooth extraction    . Cardiac catheterization N/A 03/10/2015    Procedure: Left Heart Cath and Coronary Angiography;  Surgeon: Lorretta Harp, MD;  Location: [place] [facility];  Service: Cardiovascular;  Laterality: N/A;  . Cardiac catheterization  03/13/2015    Procedure: Coronary Stent Intervention;  Surgeon: Peter M Martinique, MD;  Location: [place] [facility];  Service: Cardiovascular;;  . Cardiac catheterization  03/13/2015    Procedure: Intravascular Pressure Wire/FFR Study;  Surgeon: Peter M Martinique, MD;  Location: [place] [facility];  Service: Cardiovascular;;     Current Outpatient Prescriptions  Medication Sig Dispense Refill  . amLODipine (NORVASC) 5 MG tablet Take 1 tablet (5 mg total) by mouth daily. 30 tablet 11  . apixaban (ELIQUIS) 5 MG TABS tablet Take 1 tablet (5 mg total) by mouth 2 (two) times daily. 180 tablet 3  . clopidogrel (PLAVIX) 75 MG tablet Take 1 tablet (75 mg total) by mouth daily. 90 tablet 3  . metoprolol succinate (TOPROL-XL) 25 MG 24 hr tablet Take 3 tablets (75 mg total) by mouth daily. 90 tablet 11  . Multiple Vitamins-Minerals (PRESERVISION AREDS) CAPS Take 1 capsule by mouth 2 (two) times daily.    . nitroGLYCERIN (NITROSTAT) 0.4 MG SL tablet PLACE ONE TABLET UNDER THE TONGUE EVERY 5 MINUTES AS NEEDED FOR CHEST PAIN 25 tablet 3  . NON FORMULARY as directed. tocotrienols are vitamin E supplement, Lutein-zeaxanthis, and piracetam    . Piracetam POWD Take 2 scoop by mouth daily.     . Psyllium (METAMUCIL PO) Take 15 mLs by mouth 2 (two) times daily. Mix in water and drink    . Ubiquinol 100 MG CAPS Take 100 mg by mouth 2 (two) times daily.     Marland Kitchen VIGAMOX 0.5 % ophthalmic solution INSTILL ONE DROP INTO LEFT EYE 4 TIMES A DAY FOR 2 DAYS AFTER EACH MONTHLY EYE INJECTION  12   No current facility-administered medications for this  visit.    Allergies:   Statins and Zetia    Social History:  The patient  reports that he has never smoked. He has never used smokeless tobacco. He reports that he does not drink alcohol or use illicit drugs.   Family History:  The patient's family history includes Cancer in his father; Emphysema in his mother.    ROS:  Please see the history of present illness.   Otherwise, review of systems are positive for none.   All other systems are reviewed and negative.  PHYSICAL EXAM: VS:  BP 118/82 mmHg  Pulse 63  Ht 5\' 11"  (1.803 m)  Wt 195 lb (88.451 kg)  BMI 27.21 kg/m2  SpO2 90% , BMI Body mass index is 27.21 kg/(m^2). GEN: Well nourished, well developed, in no acute distress HEENT: normal Neck: no JVD, carotid bruits, or masses Cardiac: RRR; no murmurs, rubs, or gallops,no edema  Respiratory:  clear to auscultation bilaterally, normal work of breathing GI: soft, nontender, nondistended, + BS MS: no deformity or atrophy Skin: warm and dry, no rash Neuro:  Strength and sensation are intact Psych: euthymic mood, full affect   EKG:  EKG is not ordered today.    Recent Labs: 02/21/2015: ALT 16*; Magnesium 2.1; TSH 1.916 03/15/2015: Hemoglobin 15.0; Platelets 157 05/25/2015: BUN 16; Creat 0.82; Potassium 4.1; Sodium 140    Lipid Panel    Component Value Date/Time   CHOL 217* 03/15/2015 0424   TRIG 169* 03/15/2015 0424   HDL 29* 03/15/2015 0424   CHOLHDL 7.5 03/15/2015 0424   VLDL 34 03/15/2015 0424   LDLCALC 154* 03/15/2015 0424   LDLDIRECT 146.0 12/28/2014 1057      Wt Readings from Last 3 Encounters:  06/27/15 195 lb (88.451 kg)  05/04/15 196 lb 13.9 oz (89.3 kg)  03/27/15 200 lb (90.719 kg)    ASSESSMENT AND PLAN:  1. Paroxysmal atrial fibrillation: Maintaining NSR. CHADS2-VASc=4. Would maintain Eliquis given significant stroke risk profile and recent CVA post cath. Continue Toprol for suppression of PAF. 2. CAD s/p mulitple PCIs to RCA: No angina. Continue  ASA. He is intolerant to statins. 3. Essential hypertension: Well controlled. Continue BB 4.  Hyperlipidemia: Intol of statins and deemed not a candidate for PCSK 9 inhibitor because he has only failed lipitor.  He has refused crestor in the past.  I had a long discussion with him today about the danger of not being on a statin with recent NSTEMI and CAD progression.  He wants to recheck his lipids since he has been on a plant based diet and if LDL is still elevated he agrees to try Crestor. 5.  OSA on CPAP and tolerating well. D/L showed an AHI of 1.7/hr on 9cm H2O but only 38% compliant in using more than 4 hours nightly due to cold. I encouraged him to be more compliant with usage.   Current medicines are reviewed at length with the patient today.  The patient does not have concerns regarding medicines.  The following changes have been made:  no change  Labs/ tests ordered today: See above Assessment and Plan No orders of the defined types were placed in this encounter.     Disposition:   FU with me in 6 months  Signed, Sueanne Margarita, MD  06/27/2015 4:09 PM    [facility] [place], [place], [place]  [address] Phone: [phone]; Fax: [phone]

## 2015-06-27 NOTE — Patient Instructions (Signed)

## 2015-06-28 ENCOUNTER — Other Ambulatory Visit (INDEPENDENT_AMBULATORY_CARE_PROVIDER_SITE_OTHER): Payer: Medicare Other | Admitting: *Deleted

## 2015-06-28 ENCOUNTER — Encounter (HOSPITAL_COMMUNITY)
Admission: RE | Admit: 2015-06-28 | Discharge: 2015-06-28 | Disposition: A | Discharge status: Home / Self Care | Payer: Medicare Other | Source: Ambulatory Visit | Attending: Cardiology | Admitting: Cardiology

## 2015-06-28 ENCOUNTER — Ambulatory Visit (HOSPITAL_COMMUNITY): Payer: Private Health Insurance - Indemnity

## 2015-06-28 DIAGNOSIS — E785 Hyperlipidemia, unspecified: Secondary | ICD-10-CM

## 2015-06-28 DIAGNOSIS — I213 ST elevation (STEMI) myocardial infarction of unspecified site: Secondary | ICD-10-CM | POA: Diagnosis not present

## 2015-06-28 DIAGNOSIS — Z955 Presence of coronary angioplasty implant and graft: Secondary | ICD-10-CM | POA: Diagnosis not present

## 2015-06-28 LAB — HEPATIC FUNCTION PANEL
ALBUMIN: 3.8 g/dL (ref 3.6–5.1)
ALK PHOS: 126 U/L — AB (ref 40–115)
ALT: 14 U/L (ref 9–46)
AST: 16 U/L (ref 10–35)
BILIRUBIN INDIRECT: 0.4 mg/dL (ref 0.2–1.2)
Bilirubin, Direct: 0.1 mg/dL (ref <=0.2)
TOTAL PROTEIN: 6.6 g/dL (ref 6.1–8.1)
Total Bilirubin: 0.5 mg/dL (ref 0.2–1.2)

## 2015-06-28 LAB — LIPID PANEL
CHOL/HDL RATIO: 7.7 ratio — AB (ref <=5.0)
Cholesterol: 185 mg/dL (ref 125–200)
HDL: 24 mg/dL — AB (ref >=40)
LDL CALC: 90 mg/dL (ref <130)
TRIGLYCERIDES: 357 mg/dL — AB (ref <150)
VLDL: 71 mg/dL — ABNORMAL HIGH (ref <30)

## 2015-06-29 ENCOUNTER — Telehealth: Payer: Self-pay

## 2015-06-29 DIAGNOSIS — E785 Hyperlipidemia, unspecified: Secondary | ICD-10-CM

## 2015-06-29 NOTE — Telephone Encounter (Signed)
-----   Message from Aris Georgia, [facility] sent at 06/29/2015 11:44 AM EST ----- Per Dr. Theodosia Blender OV note, pt was willing to try Crestor if labs elevated.  Would start with Crestor 10mg  daily and recheck labs in 3 months.  If pt unable to tolerate, may consider PCSK-9 at that time.

## 2015-06-29 NOTE — Telephone Encounter (Signed)
Patient st he has tried Crestor before and does not want to take it again. He has neuropathy in his fingers and legs and this aggravated it.  He and his wife st they will be even more strict with diet and exercise and will recheck lab work in a few months. 4 month recall placed for fasting lab work. Patient agrees with treatment plan.

## 2015-06-30 ENCOUNTER — Encounter (HOSPITAL_COMMUNITY)
Admission: RE | Admit: 2015-06-30 | Discharge: 2015-06-30 | Disposition: A | Discharge status: Home / Self Care | Payer: Medicare Other | Source: Ambulatory Visit | Attending: Cardiology | Admitting: Cardiology

## 2015-06-30 ENCOUNTER — Encounter: Payer: Self-pay | Admitting: Cardiology

## 2015-06-30 ENCOUNTER — Encounter (HOSPITAL_COMMUNITY): Payer: Medicare Other

## 2015-06-30 ENCOUNTER — Ambulatory Visit (HOSPITAL_COMMUNITY): Payer: Private Health Insurance - Indemnity

## 2015-06-30 DIAGNOSIS — Z955 Presence of coronary angioplasty implant and graft: Secondary | ICD-10-CM | POA: Diagnosis not present

## 2015-06-30 DIAGNOSIS — I213 ST elevation (STEMI) myocardial infarction of unspecified site: Secondary | ICD-10-CM | POA: Diagnosis not present

## 2015-07-03 ENCOUNTER — Encounter (HOSPITAL_COMMUNITY)
Admission: RE | Admit: 2015-07-03 | Discharge: 2015-07-03 | Disposition: A | Discharge status: Home / Self Care | Payer: Medicare Other | Source: Ambulatory Visit | Attending: Cardiology | Admitting: Cardiology

## 2015-07-03 ENCOUNTER — Ambulatory Visit (HOSPITAL_COMMUNITY): Payer: Private Health Insurance - Indemnity

## 2015-07-03 DIAGNOSIS — I213 ST elevation (STEMI) myocardial infarction of unspecified site: Secondary | ICD-10-CM | POA: Diagnosis not present

## 2015-07-03 DIAGNOSIS — Z955 Presence of coronary angioplasty implant and graft: Secondary | ICD-10-CM | POA: Diagnosis not present

## 2015-07-05 ENCOUNTER — Encounter (HOSPITAL_COMMUNITY)
Admission: RE | Admit: 2015-07-05 | Discharge: 2015-07-05 | Disposition: A | Discharge status: Home / Self Care | Payer: Medicare Other | Source: Ambulatory Visit | Attending: Cardiology | Admitting: Cardiology

## 2015-07-05 ENCOUNTER — Ambulatory Visit (HOSPITAL_COMMUNITY): Payer: Private Health Insurance - Indemnity

## 2015-07-05 DIAGNOSIS — Z955 Presence of coronary angioplasty implant and graft: Secondary | ICD-10-CM | POA: Diagnosis not present

## 2015-07-05 DIAGNOSIS — I213 ST elevation (STEMI) myocardial infarction of unspecified site: Secondary | ICD-10-CM | POA: Diagnosis not present

## 2015-07-07 ENCOUNTER — Ambulatory Visit (HOSPITAL_COMMUNITY): Payer: Private Health Insurance - Indemnity

## 2015-07-07 ENCOUNTER — Encounter (HOSPITAL_COMMUNITY)
Admission: RE | Admit: 2015-07-07 | Discharge: 2015-07-07 | Disposition: A | Discharge status: Home / Self Care | Payer: Medicare Other | Source: Ambulatory Visit | Attending: Cardiology | Admitting: Cardiology

## 2015-07-07 DIAGNOSIS — Z955 Presence of coronary angioplasty implant and graft: Secondary | ICD-10-CM | POA: Diagnosis not present

## 2015-07-07 DIAGNOSIS — I213 ST elevation (STEMI) myocardial infarction of unspecified site: Secondary | ICD-10-CM | POA: Diagnosis not present

## 2015-07-10 ENCOUNTER — Encounter (HOSPITAL_COMMUNITY)
Admission: RE | Admit: 2015-07-10 | Discharge: 2015-07-10 | Disposition: A | Discharge status: Home / Self Care | Payer: Medicare Other | Source: Ambulatory Visit | Attending: Cardiology | Admitting: Cardiology

## 2015-07-10 DIAGNOSIS — I213 ST elevation (STEMI) myocardial infarction of unspecified site: Secondary | ICD-10-CM | POA: Diagnosis not present

## 2015-07-10 DIAGNOSIS — Z955 Presence of coronary angioplasty implant and graft: Secondary | ICD-10-CM | POA: Diagnosis not present

## 2015-07-12 ENCOUNTER — Encounter (HOSPITAL_COMMUNITY)
Admission: RE | Admit: 2015-07-12 | Discharge: 2015-07-12 | Disposition: A | Discharge status: Home / Self Care | Payer: Medicare Other | Source: Ambulatory Visit | Attending: Cardiology | Admitting: Cardiology

## 2015-07-12 DIAGNOSIS — I213 ST elevation (STEMI) myocardial infarction of unspecified site: Secondary | ICD-10-CM | POA: Diagnosis not present

## 2015-07-12 DIAGNOSIS — Z955 Presence of coronary angioplasty implant and graft: Secondary | ICD-10-CM | POA: Diagnosis not present

## 2015-07-12 NOTE — Progress Notes (Signed)
Dominic [name] 82 y.o. male Nutrition Note Spoke with pt.  Nutrition Survey reviewed with pt. Pt is following Step 2 of the Therapeutic Lifestyle Changes diet according to pt MEDFICTS results. Due to statin intolerance, pt is following Dr. Maurine Minister diet Prevent and Reverse Heart disease. Pt lipids appear to have improved on current diet with the exception of pt's Triglycerides. Pt expressed understanding of the information reviewed. Pt aware of nutrition education classes offered and  plans on attending nutrition classes. Lab Results  Component Value Date   HGBA1C 5.6 03/15/2015   Wt Readings from Last 3 Encounters:  06/27/15 195 lb (88.451 kg)  05/04/15 196 lb 13.9 oz (89.3 kg)  03/27/15 200 lb (90.719 kg)   Lab Results  Component Value Date   CHOL 185 06/28/2015   HDL 24* 06/28/2015   LDLCALC 90 06/28/2015   LDLDIRECT 146.0 12/28/2014   TRIG 357* 06/28/2015   CHOLHDL 7.7* 06/28/2015    Nutrition Diagnosis ? Food-and nutrition-related knowledge deficit related to lack of exposure to information as related to diagnosis of: ? CVD  Nutrition Intervention ? Benefits of adopting Therapeutic Lifestyle Changes discussed when Medficts reviewed. ? Pt to attend the Portion Distortion class ? Pt to attend the  ? Nutrition I class                        ? Nutrition II class ? Continue client-centered nutrition education by RD, as part of interdisciplinary care.  Goal(s) ? Pt to describe the benefit of including fruits, vegetables, whole grains, and low-fat dairy or dairy alternative products in a heart healthy meal plan.  Monitor and Evaluate progress toward nutrition goal with team.  Derek Mound, M.Ed, RD, LDN, CDE 07/12/2015 3:59 PM

## 2015-07-14 ENCOUNTER — Encounter (HOSPITAL_COMMUNITY)
Admission: RE | Admit: 2015-07-14 | Discharge: 2015-07-14 | Disposition: A | Discharge status: Home / Self Care | Payer: Medicare Other | Source: Ambulatory Visit | Attending: Cardiology | Admitting: Cardiology

## 2015-07-14 DIAGNOSIS — Z955 Presence of coronary angioplasty implant and graft: Secondary | ICD-10-CM | POA: Diagnosis not present

## 2015-07-14 DIAGNOSIS — I213 ST elevation (STEMI) myocardial infarction of unspecified site: Secondary | ICD-10-CM | POA: Diagnosis not present

## 2015-07-17 ENCOUNTER — Encounter (HOSPITAL_COMMUNITY)
Admission: RE | Admit: 2015-07-17 | Discharge: 2015-07-17 | Disposition: A | Discharge status: Home / Self Care | Payer: Medicare Other | Source: Ambulatory Visit | Attending: Cardiology | Admitting: Cardiology

## 2015-07-17 DIAGNOSIS — Z955 Presence of coronary angioplasty implant and graft: Secondary | ICD-10-CM | POA: Diagnosis not present

## 2015-07-17 DIAGNOSIS — I213 ST elevation (STEMI) myocardial infarction of unspecified site: Secondary | ICD-10-CM | POA: Diagnosis not present

## 2015-07-19 ENCOUNTER — Encounter (HOSPITAL_COMMUNITY)
Admission: RE | Admit: 2015-07-19 | Discharge: 2015-07-19 | Disposition: A | Discharge status: Home / Self Care | Payer: Medicare Other | Source: Ambulatory Visit | Attending: Cardiology | Admitting: Cardiology

## 2015-07-19 DIAGNOSIS — I213 ST elevation (STEMI) myocardial infarction of unspecified site: Secondary | ICD-10-CM | POA: Diagnosis not present

## 2015-07-19 DIAGNOSIS — Z955 Presence of coronary angioplasty implant and graft: Secondary | ICD-10-CM | POA: Diagnosis not present

## 2015-07-20 ENCOUNTER — Encounter: Payer: Self-pay | Admitting: Cardiology

## 2015-07-20 ENCOUNTER — Ambulatory Visit (INDEPENDENT_AMBULATORY_CARE_PROVIDER_SITE_OTHER): Payer: Medicare Other | Admitting: Cardiology

## 2015-07-20 VITALS — BP 140/60 | HR 54 | Ht 71.0 in | Wt 192.4 lb

## 2015-07-20 DIAGNOSIS — I2510 Atherosclerotic heart disease of native coronary artery without angina pectoris: Secondary | ICD-10-CM | POA: Diagnosis not present

## 2015-07-20 DIAGNOSIS — I4510 Unspecified right bundle-branch block: Secondary | ICD-10-CM

## 2015-07-20 DIAGNOSIS — I480 Paroxysmal atrial fibrillation: Secondary | ICD-10-CM

## 2015-07-20 DIAGNOSIS — Z9861 Coronary angioplasty status: Secondary | ICD-10-CM | POA: Diagnosis not present

## 2015-07-20 NOTE — Progress Notes (Signed)
Cardiology Office Note   Date:  07/20/2015   ID:  Dominic [name] 10-Jul-1933, MRN [id number]  PCP:   Dominic Crutch, MD  Cardiologist: Dominic Coco MD  Chief Complaint  Patient presents with  . post hospital follow up    no chest pain, sob, le edema, or claudication      History of Present Illness: Dominic [name] is a 82 y.o. male who presents for  Scheduled follow-up office visit. I had last seen him on 02/22/15. He was seen at that time after having presented to the emergency room the night before with paroxysmal atrial fibrillation.   He has had known episodes of paroxysmal atrial fibrillation since June 2015. He has been on long-term anticoagulation.  When seen in the emergency room he was in atrial fibrillation and underwent cardioversion in the emergency room. When seen in our office the following day he was maintaining normal sinus rhythm.. The patient has a history of known coronary disease. He has had prior stents.a hx of CAD status post prior BMS to the RCA and subsequent Cypher DES to the RCA in 3/06. LHC in 12/06 demonstrated significant RCA ISR which was treated with a Taxus DES. Other history includes HTN, HL, sleep apnea, PAF. Admitted in 6/15 with AF with RVR. Inpatient Myoview was low risk.   on 03/09/15 the patient presented to the emergency room with a non-STEMI. He underwent cardiac catheterization by Dominic [name].He underwent cath on 03/10/2015 w/ successful PCI to the mid-RCA for in-stent restenosis with cutting balloon angioplasty and PCI to an OM2 lesion. There was residual LAD/D1 bifurcation lesion and staged PCI was done 03/13/2015.   since discharge from the hospital the patient has had no further chest discomfort. He was delayed in entering the cardiac rehabilitation program because of pain in his left knee. He now has about 3 weeks remaining in the program and he will finish up the program at the end of February. He has not had any recurrent atrial fibrillation.  He remains on Apixaban and Plavix and he is no longer taking aspirin.  Past Medical History  Diagnosis Date  . Dyslipidemia     a. Intol of statins.  . Lung nodule     , right upper lobe  . Obstructive sleep apnea   . Prostate cancer ([place])   . Amputated finger     a. L index d/t to dog bite.  . Concussion   . Coronary artery disease 09/2004, 04/2005, 02/2015    a. Stent to prox and mid RCA 08/2001. b. DES to RCA for ISR 08/2004. c. DES to [facility] for ISR 05/2005. d. Low risk nuc 10/2013 (done for CP in setting of new AF). e. PCI to the mid-RCA for in-stent restenosis with cutting balloon angioplasty f. PCI to an OM2 lesion  . Hypertension   . RBBB (right bundle branch block)   . Atrial fibrillation with RVR ([place])     a. New onset diagnosed 11/20/2013, spont converted to NSR.  Marland Kitchen OSA (obstructive sleep apnea)     Past Surgical History  Procedure Laterality Date  . Coronary stent placement    . Prostatectomy    . L knee ligament replacement    . Anterior cruciate ligament repair Right   . Tonsillectomy    . Tooth extraction    . Cardiac catheterization N/A 03/10/2015    Procedure: Left Heart Cath and Coronary Angiography;  Surgeon: Dominic Harp, MD;  Location: [facility];  Service: Cardiovascular;  Laterality: N/A;  . Cardiac catheterization  03/13/2015    Procedure: Coronary Stent Intervention;  Surgeon: Dominic M Martinique, MD;  Location: [facility];  Service: Cardiovascular;;  . Cardiac catheterization  03/13/2015    Procedure: Intravascular Pressure Wire/FFR Study;  Surgeon: Dominic M Martinique, MD;  Location: [facility];  Service: Cardiovascular;;     Current Outpatient Prescriptions  Medication Sig Dispense Refill  . amLODipine (NORVASC) 5 MG tablet Take 1 tablet (5 mg total) by mouth daily. 30 tablet 11  . apixaban (ELIQUIS) 5 MG TABS tablet Take 1 tablet (5 mg total) by mouth 2 (two) times daily. 180 tablet 3  . clopidogrel (PLAVIX) 75 MG tablet Take 1 tablet (75  mg total) by mouth daily. 90 tablet 3  . metoprolol succinate (TOPROL-XL) 25 MG 24 hr tablet Take 3 tablets (75 mg total) by mouth daily. 90 tablet 11  . Multiple Vitamins-Minerals (PRESERVISION AREDS) CAPS Take 1 capsule by mouth 2 (two) times daily.    . nitroGLYCERIN (NITROSTAT) 0.4 MG SL tablet PLACE ONE TABLET UNDER THE TONGUE EVERY 5 MINUTES AS NEEDED FOR CHEST PAIN 25 tablet 3  . NON FORMULARY as directed. tocotrienols are vitamin E supplement, Lutein-zeaxanthis, and piracetam    . Piracetam POWD Take 2 scoop by mouth daily.     . Psyllium (METAMUCIL PO) Take 15 mLs by mouth daily. Mix in water and drink    . Ubiquinol 100 MG CAPS Take 100 mg by mouth 2 (two) times daily.     Marland Kitchen VIGAMOX 0.5 % ophthalmic solution INSTILL ONE DROP INTO LEFT EYE 4 TIMES A DAY FOR 2 DAYS AFTER EACH MONTHLY EYE INJECTION  12   No current facility-administered medications for this visit.    Allergies:   Statins and Zetia    Social History:  The patient  reports that he has never smoked. He has never used smokeless tobacco. He reports that he does not drink alcohol or use illicit drugs.   Family History:  The patient's family history includes Cancer in his father; Emphysema in his mother.    ROS:  Please see the history of present illness.   Otherwise, review of systems are positive for none.   All other systems are reviewed and negative.    PHYSICAL EXAM: VS:  BP 140/60 mmHg  Pulse 54  Ht 5\' 11"  (1.803 m)  Wt 192 lb 6.4 oz (87.272 kg)  BMI 26.85 kg/m2 , BMI Body mass index is 26.85 kg/(m^2). GEN: Well nourished, well developed, in no acute distress HEENT: normal Neck: no JVD, carotid bruits, or masses Cardiac: RRR; no murmurs, rubs, or gallops,no edema  Respiratory:  clear to auscultation bilaterally, normal work of breathing GI: soft, nontender, nondistended, + BS MS: no deformity or atrophy Skin: warm and dry, no rash Neuro:  Strength and sensation are intact Psych: euthymic mood, full  affect   EKG:  EKG is ordered today. The ekg ordered today demonstrates  Marked sinus bradycardia.  Right bundle branch block. Since last tracing of 03/23/15, no significant change   Recent Labs: 02/21/2015: Magnesium 2.1; TSH 1.916 03/15/2015: Hemoglobin 15.0; Platelets 157 05/25/2015: BUN 16; Creat 0.82; Potassium 4.1; Sodium 140 06/28/2015: ALT 14    Lipid Panel    Component Value Date/Time   CHOL 185 06/28/2015 1022   TRIG 357* 06/28/2015 1022   HDL 24* 06/28/2015 1022   CHOLHDL 7.7* 06/28/2015 1022   VLDL 71* 06/28/2015 1022  LDLCALC 90 06/28/2015 1022   LDLDIRECT 146.0 12/28/2014 1057      Wt Readings from Last 3 Encounters:  07/20/15 192 lb 6.4 oz (87.272 kg)  06/27/15 195 lb (88.451 kg)  05/04/15 196 lb 13.9 oz (89.3 kg)        ASSESSMENT AND PLAN:  1.  Coronary artery disease. History of multiple stents, most recently 9/23/ 16 by Dominic [name]. Continue long-term Plavix 2.  Paroxysmal atrial fibrillation, maintaining normal sinus rhythm.  Continue long-term Apixaban for CHA2DS2-VASc Score  4 3. CVA- On the night of 03/14/15 the patient had dizziness. No focal deficits. Pt had ataix gait while walking with cardiac rehab. Seen by neurology and obtained MRI of brain which showed CVA in mult vascular territories suggesting a 'shower' effect from PCI. 4.  Hypercholesterolemia, on rosuvastatin 5.  Essential hypertension, controlled on current medication 6.  History of sleep apnea, followed by Dr. Radford Pax    Current medicines are reviewed at length with the patient today.  The patient does not have concerns regarding medicines.  The following changes have been made:  no change  Labs/ tests ordered today include:   Orders Placed This Encounter  Procedures  . EKG 12-Lead    dis position: the patient may return to work on 08/16/2015.  He will have finished the cardiac rehabilitation program by then.  return in 4 months for follow-up office visit with Dominic [name] who did  his most recent cardiac catheterization. The patient's wife was very pleased with Dominic [name].  Continue current medication.   Berna Spare MD 07/20/2015 5:59 PM    [facility] [address], [place], [place]  [address] Phone: [phone]; Fax: [phone]

## 2015-07-20 NOTE — Patient Instructions (Signed)
Medication Instructions:  Your physician recommends that you continue on your current medications as directed. Please refer to the Current Medication list given to you today.  Labwork: none  Testing/Procedures: None  Follow-Up: Your physician recommends that you schedule a follow-up appointment in: 4 month ov with Dr Martinique   Any Other Special Instructions Will Be Listed Below (If Applicable). You may return to work on August 16, 2015  If you need a refill on your cardiac medications before your next appointment, please call your pharmacy.

## 2015-07-21 ENCOUNTER — Encounter (HOSPITAL_COMMUNITY)
Admission: RE | Admit: 2015-07-21 | Discharge: 2015-07-21 | Disposition: A | Discharge status: Home / Self Care | Payer: Medicare Other | Source: Ambulatory Visit | Attending: Cardiology | Admitting: Cardiology

## 2015-07-21 DIAGNOSIS — Z955 Presence of coronary angioplasty implant and graft: Secondary | ICD-10-CM | POA: Diagnosis not present

## 2015-07-21 DIAGNOSIS — I213 ST elevation (STEMI) myocardial infarction of unspecified site: Secondary | ICD-10-CM | POA: Diagnosis not present

## 2015-07-24 ENCOUNTER — Encounter (HOSPITAL_COMMUNITY)
Admission: RE | Admit: 2015-07-24 | Discharge: 2015-07-24 | Disposition: A | Discharge status: Home / Self Care | Payer: Medicare Other | Source: Ambulatory Visit | Attending: Cardiology | Admitting: Cardiology

## 2015-07-24 DIAGNOSIS — I213 ST elevation (STEMI) myocardial infarction of unspecified site: Secondary | ICD-10-CM | POA: Diagnosis not present

## 2015-07-24 DIAGNOSIS — Z955 Presence of coronary angioplasty implant and graft: Secondary | ICD-10-CM | POA: Diagnosis not present

## 2015-07-26 ENCOUNTER — Encounter (HOSPITAL_COMMUNITY)
Admission: RE | Admit: 2015-07-26 | Discharge: 2015-07-26 | Disposition: A | Discharge status: Home / Self Care | Payer: Medicare Other | Source: Ambulatory Visit | Attending: Cardiology | Admitting: Cardiology

## 2015-07-26 DIAGNOSIS — Z955 Presence of coronary angioplasty implant and graft: Secondary | ICD-10-CM | POA: Diagnosis not present

## 2015-07-26 DIAGNOSIS — I213 ST elevation (STEMI) myocardial infarction of unspecified site: Secondary | ICD-10-CM | POA: Diagnosis not present

## 2015-07-28 ENCOUNTER — Encounter (HOSPITAL_COMMUNITY)
Admission: RE | Admit: 2015-07-28 | Discharge: 2015-07-28 | Disposition: A | Discharge status: Home / Self Care | Payer: Medicare Other | Source: Ambulatory Visit | Attending: Cardiology | Admitting: Cardiology

## 2015-07-28 DIAGNOSIS — Z955 Presence of coronary angioplasty implant and graft: Secondary | ICD-10-CM | POA: Diagnosis not present

## 2015-07-28 DIAGNOSIS — I213 ST elevation (STEMI) myocardial infarction of unspecified site: Secondary | ICD-10-CM | POA: Diagnosis not present

## 2015-07-31 ENCOUNTER — Encounter (HOSPITAL_COMMUNITY)
Admission: RE | Admit: 2015-07-31 | Discharge: 2015-07-31 | Disposition: A | Discharge status: Home / Self Care | Payer: Medicare Other | Source: Ambulatory Visit | Attending: Cardiology | Admitting: Cardiology

## 2015-07-31 DIAGNOSIS — I213 ST elevation (STEMI) myocardial infarction of unspecified site: Secondary | ICD-10-CM | POA: Diagnosis not present

## 2015-07-31 DIAGNOSIS — Z955 Presence of coronary angioplasty implant and graft: Secondary | ICD-10-CM | POA: Diagnosis not present

## 2015-08-02 ENCOUNTER — Encounter (HOSPITAL_COMMUNITY)
Admission: RE | Admit: 2015-08-02 | Discharge: 2015-08-02 | Disposition: A | Discharge status: Home / Self Care | Payer: Medicare Other | Source: Ambulatory Visit | Attending: Cardiology | Admitting: Cardiology

## 2015-08-02 DIAGNOSIS — I213 ST elevation (STEMI) myocardial infarction of unspecified site: Secondary | ICD-10-CM | POA: Diagnosis not present

## 2015-08-02 DIAGNOSIS — Z955 Presence of coronary angioplasty implant and graft: Secondary | ICD-10-CM | POA: Diagnosis not present

## 2015-08-04 ENCOUNTER — Encounter (HOSPITAL_COMMUNITY)
Admission: RE | Admit: 2015-08-04 | Discharge: 2015-08-04 | Disposition: A | Discharge status: Home / Self Care | Payer: Medicare Other | Source: Ambulatory Visit | Attending: Cardiology | Admitting: Cardiology

## 2015-08-04 DIAGNOSIS — I213 ST elevation (STEMI) myocardial infarction of unspecified site: Secondary | ICD-10-CM | POA: Diagnosis not present

## 2015-08-04 DIAGNOSIS — Z955 Presence of coronary angioplasty implant and graft: Secondary | ICD-10-CM | POA: Diagnosis not present

## 2015-08-07 ENCOUNTER — Encounter (HOSPITAL_COMMUNITY)
Admission: RE | Admit: 2015-08-07 | Discharge: 2015-08-07 | Disposition: A | Discharge status: Home / Self Care | Payer: Medicare Other | Source: Ambulatory Visit | Attending: Cardiology | Admitting: Cardiology

## 2015-08-07 DIAGNOSIS — I213 ST elevation (STEMI) myocardial infarction of unspecified site: Secondary | ICD-10-CM | POA: Diagnosis not present

## 2015-08-07 DIAGNOSIS — Z955 Presence of coronary angioplasty implant and graft: Secondary | ICD-10-CM | POA: Diagnosis not present

## 2015-08-09 ENCOUNTER — Encounter (HOSPITAL_COMMUNITY)
Admission: RE | Admit: 2015-08-09 | Discharge: 2015-08-09 | Disposition: A | Discharge status: Home / Self Care | Payer: Medicare Other | Source: Ambulatory Visit | Attending: Cardiology | Admitting: Cardiology

## 2015-08-09 DIAGNOSIS — Z955 Presence of coronary angioplasty implant and graft: Secondary | ICD-10-CM | POA: Diagnosis not present

## 2015-08-09 DIAGNOSIS — I213 ST elevation (STEMI) myocardial infarction of unspecified site: Secondary | ICD-10-CM | POA: Diagnosis not present

## 2015-08-11 ENCOUNTER — Encounter (HOSPITAL_COMMUNITY)
Admission: RE | Admit: 2015-08-11 | Discharge: 2015-08-11 | Disposition: A | Discharge status: Home / Self Care | Payer: Medicare Other | Source: Ambulatory Visit | Attending: Cardiology | Admitting: Cardiology

## 2015-08-11 DIAGNOSIS — Z955 Presence of coronary angioplasty implant and graft: Secondary | ICD-10-CM | POA: Diagnosis not present

## 2015-08-11 DIAGNOSIS — I213 ST elevation (STEMI) myocardial infarction of unspecified site: Secondary | ICD-10-CM | POA: Diagnosis not present

## 2015-08-11 NOTE — Progress Notes (Signed)
Pt graduated from cardiac rehab program today with completion of 36 exercise sessions in Phase II. Pt maintained good attendance and progressed nicely during his participation in rehab as evidenced by increased MET level.   Medication list reconciled. Repeat  PHQ score- 0 .  Pt has made significant lifestyle changes and should be commended for his success. Pt feels he has achieved his goals during cardiac rehab.   Pt plans to continue exercise by walking at home and using home exercise equipment.

## 2015-08-14 ENCOUNTER — Encounter (HOSPITAL_COMMUNITY): Payer: Medicare Other

## 2015-08-16 ENCOUNTER — Encounter (HOSPITAL_COMMUNITY): Payer: Medicare Other

## 2015-08-17 ENCOUNTER — Encounter (INDEPENDENT_AMBULATORY_CARE_PROVIDER_SITE_OTHER): Payer: Medicare Other | Admitting: Ophthalmology

## 2015-08-17 DIAGNOSIS — I10 Essential (primary) hypertension: Secondary | ICD-10-CM

## 2015-08-17 DIAGNOSIS — H353231 Exudative age-related macular degeneration, bilateral, with active choroidal neovascularization: Secondary | ICD-10-CM

## 2015-08-17 DIAGNOSIS — H35033 Hypertensive retinopathy, bilateral: Secondary | ICD-10-CM

## 2015-08-17 DIAGNOSIS — H2513 Age-related nuclear cataract, bilateral: Secondary | ICD-10-CM | POA: Diagnosis not present

## 2015-08-17 DIAGNOSIS — H43813 Vitreous degeneration, bilateral: Secondary | ICD-10-CM

## 2015-08-18 ENCOUNTER — Encounter (HOSPITAL_COMMUNITY): Payer: Medicare Other

## 2015-08-21 ENCOUNTER — Encounter (HOSPITAL_COMMUNITY): Payer: Medicare Other

## 2015-08-23 ENCOUNTER — Encounter (HOSPITAL_COMMUNITY): Payer: Medicare Other

## 2015-08-25 ENCOUNTER — Encounter (HOSPITAL_COMMUNITY): Payer: Medicare Other

## 2015-08-28 ENCOUNTER — Encounter (HOSPITAL_COMMUNITY): Payer: Medicare Other

## 2015-08-30 ENCOUNTER — Encounter (HOSPITAL_COMMUNITY): Payer: Medicare Other

## 2015-08-31 DIAGNOSIS — S76211A Strain of adductor muscle, fascia and tendon of right thigh, initial encounter: Secondary | ICD-10-CM | POA: Diagnosis not present

## 2015-09-01 ENCOUNTER — Encounter (HOSPITAL_COMMUNITY): Payer: Medicare Other

## 2015-09-04 ENCOUNTER — Encounter (HOSPITAL_COMMUNITY): Payer: Medicare Other

## 2015-09-06 ENCOUNTER — Encounter (HOSPITAL_COMMUNITY): Payer: Medicare Other

## 2015-09-08 ENCOUNTER — Encounter (HOSPITAL_COMMUNITY): Payer: Medicare Other

## 2015-10-03 DIAGNOSIS — H353190 Nonexudative age-related macular degeneration, unspecified eye, stage unspecified: Secondary | ICD-10-CM | POA: Diagnosis not present

## 2015-10-08 IMAGING — DX DG CHEST 1V PORT
1 series · 1 of 1 positions shown · non-contrast
Comparison: April 19, 2014

CLINICAL DATA: Dizziness and atrial fibrillation

EXAM:
PORTABLE CHEST - 1 VIEW

[chest ap]
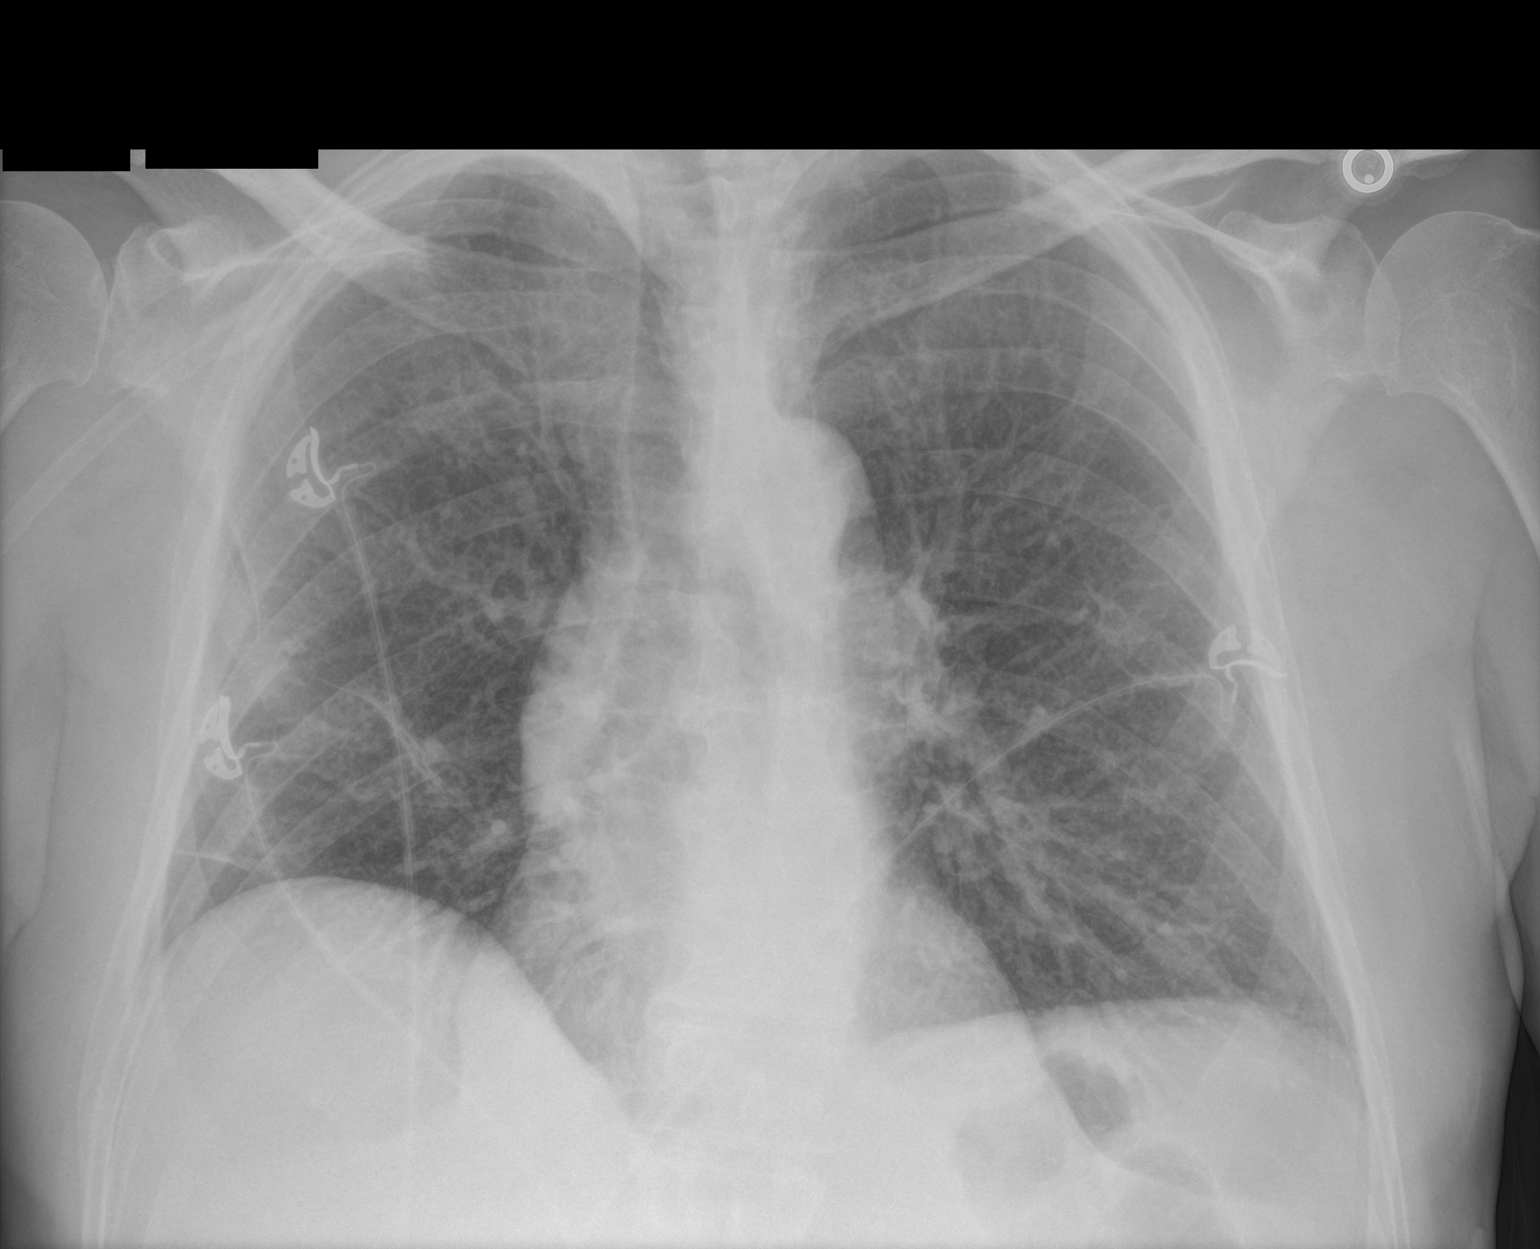

[1 of 1 positions shown; findings below may reference images not displayed]

FINDINGS: There is stable scarring in the right lower lobe region. There is no
edema or consolidation. Heart size and pulmonary vascularity are
normal. No adenopathy. No bone lesions. There is a stent in the
right coronary artery region.
IMPRESSION: No edema or consolidation. Stable scarring on the right. No change
in cardiac silhouette.

## 2015-10-24 IMAGING — CR DG CHEST 2V
2 series · 2 of 2 positions shown · non-contrast
Comparison: 02/21/2015 and 04/19/2014 radiographs.

CLINICAL DATA: Chest tightness for 2 or 3 days, worse today.
Abnormal EKG. History of atrial fibrillation and stents. Initial
encounter.

EXAM:
CHEST  2 VIEW

[chest pa]
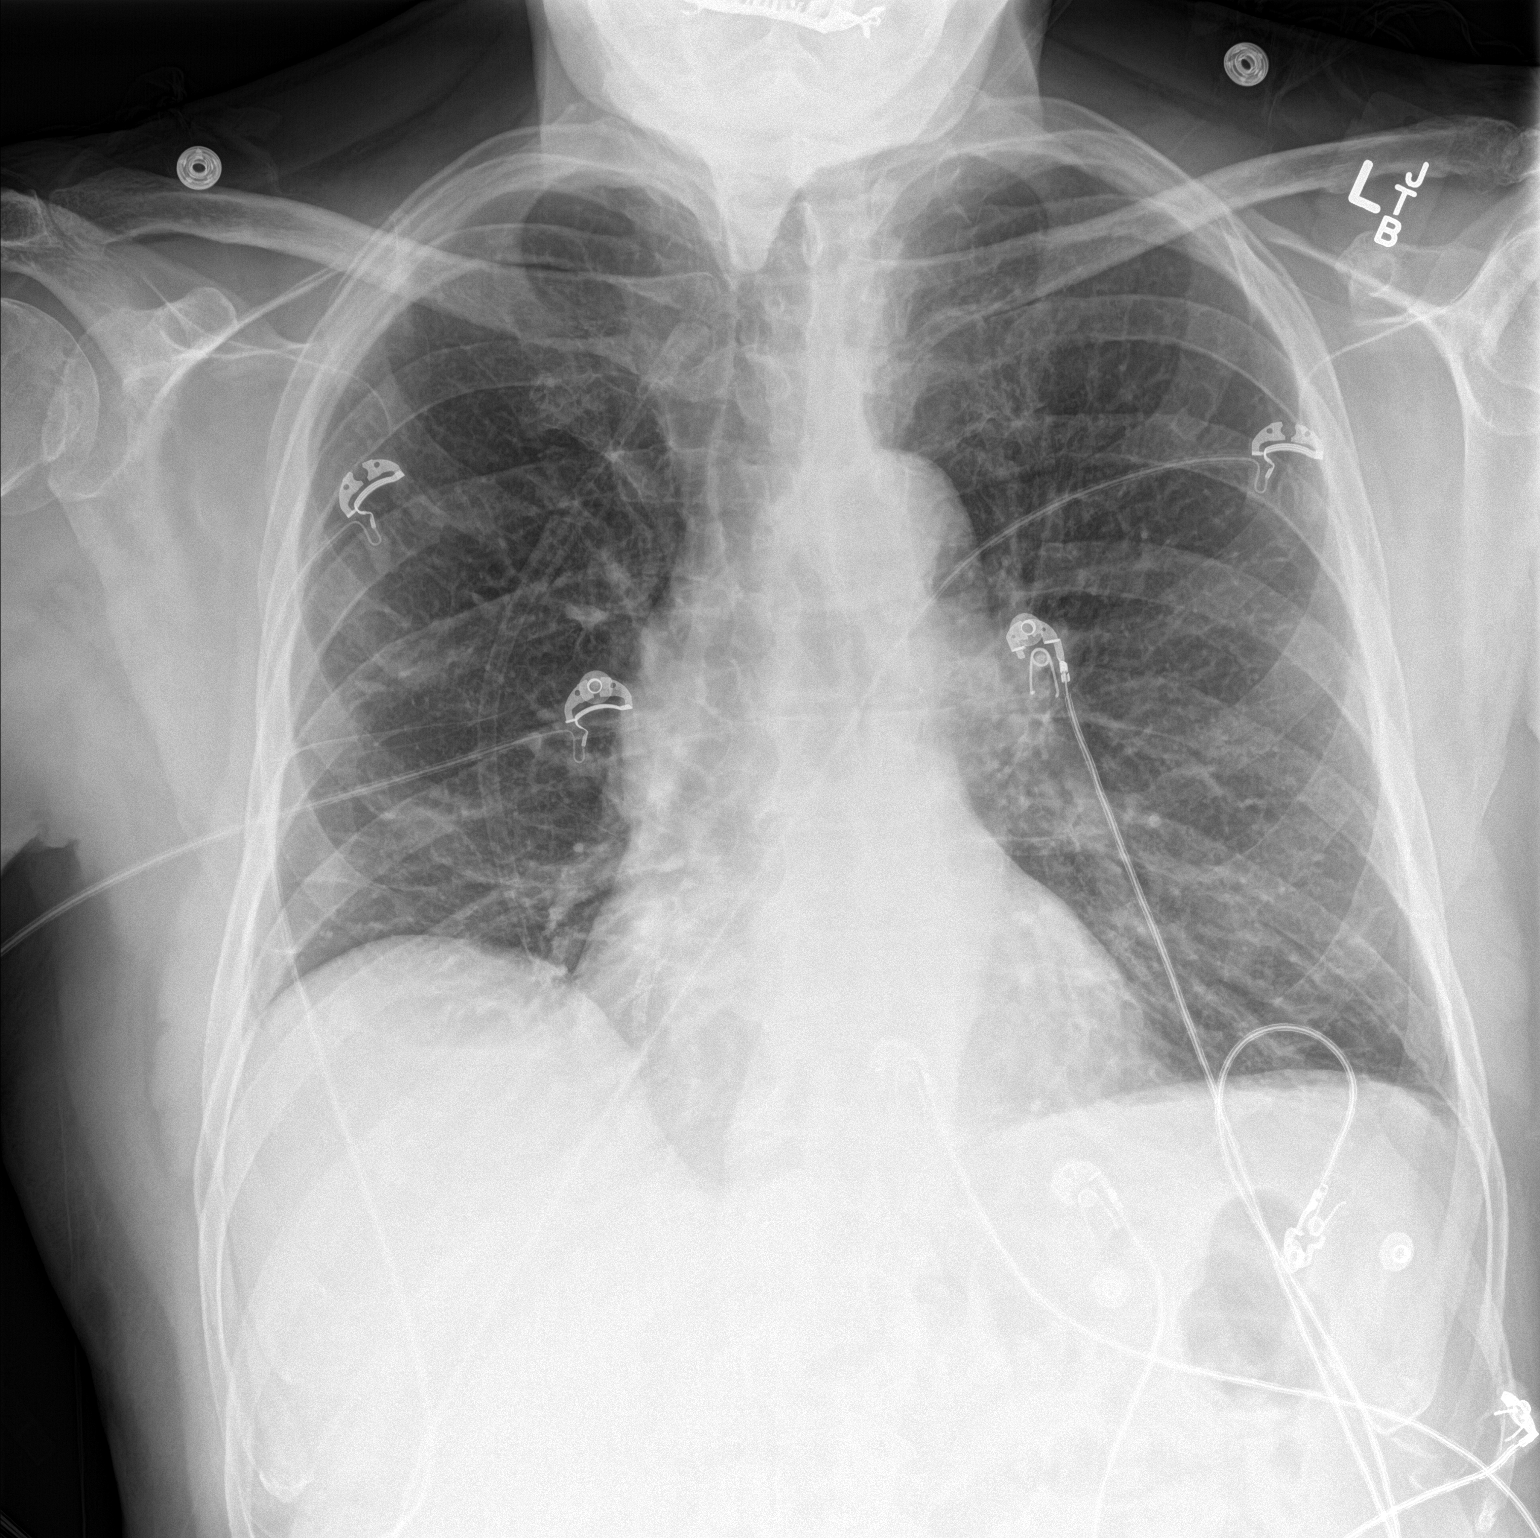

[chest lat]
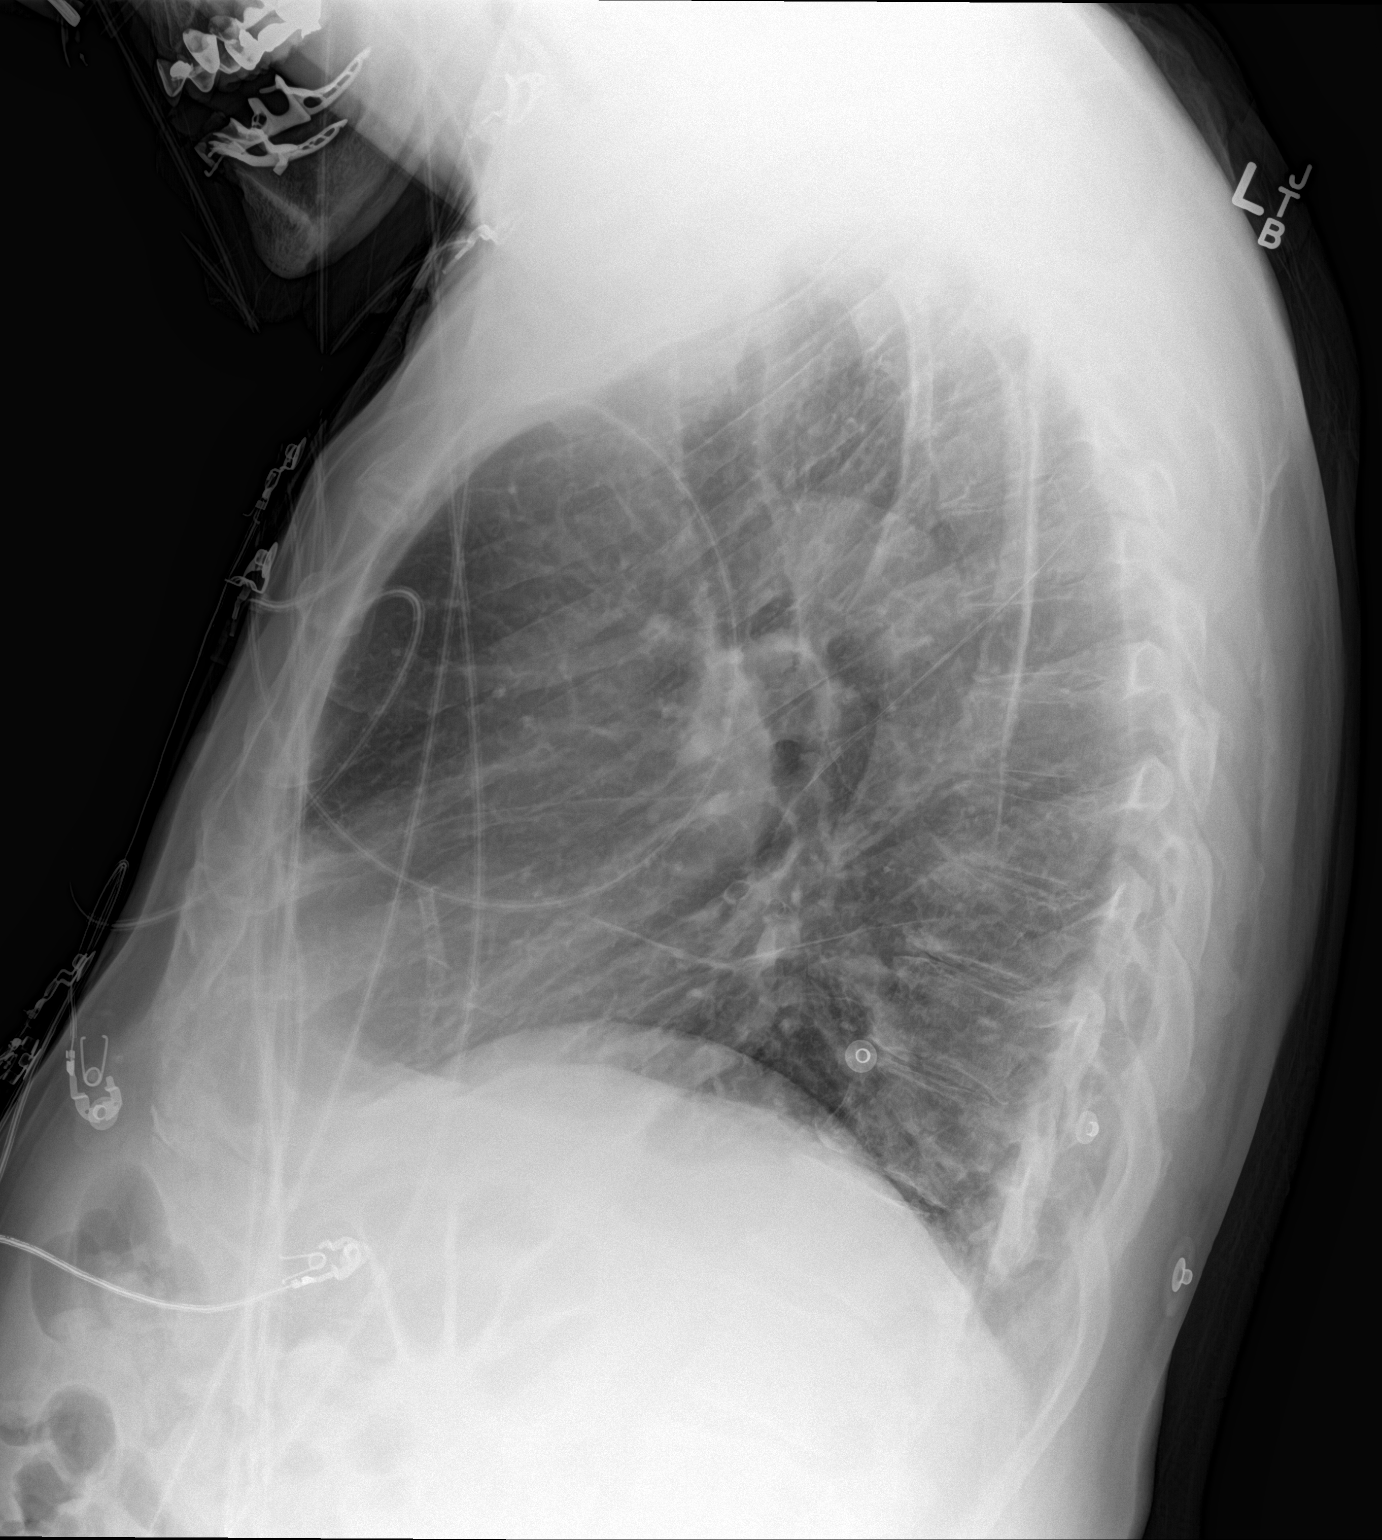

[2 of 2 positions shown; findings below may reference images not displayed]

FINDINGS: The heart size and mediastinal contours are stable. Coronary stents
are noted. There is stable elevation of the right hemidiaphragm with
associated bibasilar atelectasis or scarring. No edema, confluent
airspace opacity or significant pleural effusion. The bones appear
unchanged.
IMPRESSION: Stable chest.  No acute cardiopulmonary process.

## 2015-10-26 ENCOUNTER — Encounter (INDEPENDENT_AMBULATORY_CARE_PROVIDER_SITE_OTHER): Payer: Medicare Other | Admitting: Ophthalmology

## 2015-10-26 DIAGNOSIS — H43813 Vitreous degeneration, bilateral: Secondary | ICD-10-CM | POA: Diagnosis not present

## 2015-10-26 DIAGNOSIS — H2513 Age-related nuclear cataract, bilateral: Secondary | ICD-10-CM

## 2015-10-26 DIAGNOSIS — I10 Essential (primary) hypertension: Secondary | ICD-10-CM | POA: Diagnosis not present

## 2015-10-26 DIAGNOSIS — H353231 Exudative age-related macular degeneration, bilateral, with active choroidal neovascularization: Secondary | ICD-10-CM

## 2015-10-26 DIAGNOSIS — H35033 Hypertensive retinopathy, bilateral: Secondary | ICD-10-CM

## 2015-11-23 ENCOUNTER — Encounter: Payer: Self-pay | Admitting: Cardiology

## 2015-11-23 ENCOUNTER — Ambulatory Visit (INDEPENDENT_AMBULATORY_CARE_PROVIDER_SITE_OTHER): Payer: Medicare Other | Admitting: Cardiology

## 2015-11-23 VITALS — BP 136/62 | HR 52 | Ht 72.0 in | Wt 185.0 lb

## 2015-11-23 DIAGNOSIS — I214 Non-ST elevation (NSTEMI) myocardial infarction: Secondary | ICD-10-CM

## 2015-11-23 DIAGNOSIS — I4510 Unspecified right bundle-branch block: Secondary | ICD-10-CM

## 2015-11-23 DIAGNOSIS — Z7901 Long term (current) use of anticoagulants: Secondary | ICD-10-CM

## 2015-11-23 DIAGNOSIS — Z9861 Coronary angioplasty status: Secondary | ICD-10-CM | POA: Diagnosis not present

## 2015-11-23 DIAGNOSIS — I2510 Atherosclerotic heart disease of native coronary artery without angina pectoris: Secondary | ICD-10-CM | POA: Diagnosis not present

## 2015-11-23 DIAGNOSIS — E785 Hyperlipidemia, unspecified: Secondary | ICD-10-CM | POA: Diagnosis not present

## 2015-11-23 DIAGNOSIS — I480 Paroxysmal atrial fibrillation: Secondary | ICD-10-CM

## 2015-11-23 NOTE — Progress Notes (Addendum)
Cardiology Office Note   Date:  11/23/2015   ID:  Dominic, [name] 08-10-1933, MRN [id number]  PCP:   Melinda Crutch, MD  Cardiologist: Peter Martinique MD  Chief Complaint  Patient presents with  . 4 MONTHS    PATIENT HAS NO COMPLAINTS.      History of Present Illness: Dominic [name] is a 82 y.o. male who is seen for follow up CAD and Afib. He is a former patient of Dr. Mare Ferrari.   He has had known episodes of paroxysmal atrial fibrillation since June 2015. He has been on long-term anticoagulation with Eliquis.   The patient has a history of known coronary disease. He has had prior stents.with  BMS to the RCA and subsequent Cypher DES to the RCA in 3/06. LHC in 12/06 demonstrated significant RCA ISR which was treated with a Taxus DES. Other history includes HTN, HL, sleep apnea, PAF.   Admitted in 6/15 with AF with RVR. Inpatient Myoview was low risk.   on 03/09/15 the patient presented to the emergency room with a non-STEMI. He underwent cath on 03/10/2015 w/ successful PCI to the mid-RCA for in-stent restenosis with cutting balloon angioplasty and PCI to an OM2 lesion. There was residual LAD/D1 bifurcation lesion and staged PCI was done 03/13/2015. Following this procedure the patient developed ataxia, speech difficulties and was diagnosed with embolic CVA.  On follow up today he still notes some residual from his CVA with a shuffling gait and some change in speech. He has completed PT. He is back working for [employer]. He denies any chest pain other than mild tightness when he is straining. He is eating a plant based diet now and has lost weight. He is intolerant of statins. He is going to need extensive dental extractions due to extensive decay.   Past Medical History  Diagnosis Date  . Dyslipidemia     a. Intol of statins.  . Lung nodule     , right upper lobe  . Obstructive sleep apnea   . Prostate cancer ([place])   . Amputated finger     a. L index d/t to dog bite.  .  Concussion   . Coronary artery disease 09/2004, 04/2005, 02/2015    a. Stent to prox and mid RCA 08/2001. b. DES to RCA for ISR 08/2004. c. DES to [facility] for ISR 05/2005. d. Low risk nuc 10/2013 (done for CP in setting of new AF). e. PCI to the mid-RCA for in-stent restenosis with cutting balloon angioplasty f. PCI to an OM2 lesion  . Hypertension   . RBBB (right bundle branch block)   . Atrial fibrillation with RVR ([place])     a. New onset diagnosed 11/20/2013, spont converted to NSR.  Marland Kitchen OSA (obstructive sleep apnea)     Past Surgical History  Procedure Laterality Date  . Coronary stent placement    . Prostatectomy    . L knee ligament replacement    . Anterior cruciate ligament repair Right   . Tonsillectomy    . Tooth extraction    . Cardiac catheterization N/A 03/10/2015    Procedure: Left Heart Cath and Coronary Angiography;  Surgeon: Lorretta Harp, MD;  Location: [facility];  Service: Cardiovascular;  Laterality: N/A;  . Cardiac catheterization  03/13/2015    Procedure: Coronary Stent Intervention;  Surgeon: Peter M Martinique, MD;  Location: [facility];  Service: Cardiovascular;;  . Cardiac catheterization  03/13/2015    Procedure:  Intravascular Pressure Wire/FFR Study;  Surgeon: Peter M Martinique, MD;  Location: [facility];  Service: Cardiovascular;;     Current Outpatient Prescriptions  Medication Sig Dispense Refill  . amLODipine (NORVASC) 5 MG tablet Take 1 tablet (5 mg total) by mouth daily. 30 tablet 11  . apixaban (ELIQUIS) 5 MG TABS tablet Take 1 tablet (5 mg total) by mouth 2 (two) times daily. 180 tablet 3  . clopidogrel (PLAVIX) 75 MG tablet Take 1 tablet (75 mg total) by mouth daily. 90 tablet 3  . metoprolol succinate (TOPROL-XL) 25 MG 24 hr tablet Take 3 tablets (75 mg total) by mouth daily. 90 tablet 11  . Multiple Vitamins-Minerals (PRESERVISION AREDS) CAPS Take 1 capsule by mouth 2 (two) times daily.    . nitroGLYCERIN (NITROSTAT) 0.4 MG SL tablet  PLACE ONE TABLET UNDER THE TONGUE EVERY 5 MINUTES AS NEEDED FOR CHEST PAIN 25 tablet 3  . NON FORMULARY as directed. tocotrienols are vitamin E supplement, Lutein-zeaxanthis, and piracetam    . Piracetam POWD Take 2 scoop by mouth daily.     . Psyllium (METAMUCIL PO) Take 15 mLs by mouth daily. Mix in water and drink    . Ubiquinol 100 MG CAPS Take 100 mg by mouth 2 (two) times daily.     Marland Kitchen VIGAMOX 0.5 % ophthalmic solution INSTILL ONE DROP INTO LEFT EYE 4 TIMES A DAY FOR 2 DAYS AFTER EACH MONTHLY EYE INJECTION  12   No current facility-administered medications for this visit.    Allergies:   Statins and Zetia    Social History:  The patient  reports that he has never smoked. He has never used smokeless tobacco. He reports that he does not drink alcohol or use illicit drugs.   Family History:  The patient's family history includes Cancer in his father; Emphysema in his mother.    ROS:  Please see the history of present illness.   Otherwise, review of systems are positive for none.   All other systems are reviewed and negative.    PHYSICAL EXAM: VS:  BP 136/62 mmHg  Pulse 52  Ht 6' (1.829 m)  Wt 185 lb (83.915 kg)  BMI 25.08 kg/m2 , BMI Body mass index is 25.08 kg/(m^2). GEN: Well nourished, well developed, in no acute distress  HEENT: poor dentition Neck: no JVD, carotid bruits, or masses Cardiac: RRR; no murmurs, rubs, or gallops,no edema  Respiratory:  clear to auscultation bilaterally, normal work of breathing GI: soft, nontender, nondistended, + BS MS: no deformity or atrophy  Skin: warm and dry, no rash Neuro:  Strength and sensation are intact Psych: euthymic mood, full affect   EKG:  EKG is not ordered today.   Recent Labs: 02/21/2015: Magnesium 2.1; TSH 1.916 03/15/2015: Hemoglobin 15.0; Platelets 157 05/25/2015: BUN 16; Creat 0.82; Potassium 4.1; Sodium 140 06/28/2015: ALT 14    Lipid Panel    Component Value Date/Time   CHOL 185 06/28/2015 1022   TRIG 357*  06/28/2015 1022   HDL 24* 06/28/2015 1022   CHOLHDL 7.7* 06/28/2015 1022   VLDL 71* 06/28/2015 1022   LDLCALC 90 06/28/2015 1022   LDLDIRECT 146.0 12/28/2014 1057      Wt Readings from Last 3 Encounters:  11/23/15 185 lb (83.915 kg)  07/20/15 192 lb 6.4 oz (87.272 kg)  06/27/15 195 lb (88.451 kg)        ASSESSMENT AND PLAN:  1.  Coronary artery disease. History of multiple stents as noted above, most recently  9/23/ 16 with PTCA of the RCA for instent restenosis, DES of OM, and LAD stenting. Continue long-term Plavix. No ASA since on Eliquis. Would like to postpone dental work for a full year from stenting so his antiplatelet therapy and Eliquis can be held safer then.  2.  Paroxysmal atrial fibrillation, maintaining normal sinus rhythm.  Continue long-term Apixaban for CHA2DS2-VASc Score  4 3. CVA- embolic post cath procedure.  4.  Hypercholesterolemia, on rosuvastatin 5.  Essential hypertension, controlled on current medication 6.  History of sleep apnea, patient is on CPAP therapy with symptomatic improvement.    Current medicines are reviewed at length with the patient today.  The patient does not have concerns regarding medicines.  The following changes have been made:  no change  Labs/ tests ordered today include:   Orders Placed This Encounter  Procedures  . Basic metabolic panel  . Lipid panel  . Hepatic function panel    Signed, Peter Martinique MD, [facility]    11/23/2015 9:00 PM    [place]

## 2015-11-23 NOTE — Patient Instructions (Signed)
We will schedule you for fasting lab work  Continue your current therapy  I will see you in 6 months.   

## 2015-11-27 DIAGNOSIS — I4510 Unspecified right bundle-branch block: Secondary | ICD-10-CM | POA: Diagnosis not present

## 2015-11-27 DIAGNOSIS — Z9861 Coronary angioplasty status: Secondary | ICD-10-CM | POA: Diagnosis not present

## 2015-11-27 DIAGNOSIS — I4891 Unspecified atrial fibrillation: Secondary | ICD-10-CM | POA: Diagnosis not present

## 2015-11-27 DIAGNOSIS — I480 Paroxysmal atrial fibrillation: Secondary | ICD-10-CM | POA: Diagnosis not present

## 2015-11-27 DIAGNOSIS — I2510 Atherosclerotic heart disease of native coronary artery without angina pectoris: Secondary | ICD-10-CM | POA: Diagnosis not present

## 2015-11-27 DIAGNOSIS — Z7901 Long term (current) use of anticoagulants: Secondary | ICD-10-CM | POA: Diagnosis not present

## 2015-11-27 DIAGNOSIS — E785 Hyperlipidemia, unspecified: Secondary | ICD-10-CM | POA: Diagnosis not present

## 2015-11-27 DIAGNOSIS — I214 Non-ST elevation (NSTEMI) myocardial infarction: Secondary | ICD-10-CM | POA: Diagnosis not present

## 2015-11-27 LAB — LIPID PANEL
CHOL/HDL RATIO: 6.4 ratio — AB (ref <=5.0)
Cholesterol: 186 mg/dL (ref 125–200)
HDL: 29 mg/dL — AB (ref >=40)
LDL CALC: 108 mg/dL (ref <130)
TRIGLYCERIDES: 246 mg/dL — AB (ref <150)
VLDL: 49 mg/dL — ABNORMAL HIGH (ref <30)

## 2015-11-27 LAB — BASIC METABOLIC PANEL
BUN: 19 mg/dL (ref 7–25)
CALCIUM: 9.6 mg/dL (ref 8.6–10.3)
CHLORIDE: 106 mmol/L (ref 98–110)
CO2: 24 mmol/L (ref 20–31)
Creat: 0.74 mg/dL (ref 0.70–1.18)
GLUCOSE: 79 mg/dL (ref 65–99)
POTASSIUM: 4.3 mmol/L (ref 3.5–5.3)
SODIUM: 140 mmol/L (ref 135–146)

## 2015-11-27 LAB — HEPATIC FUNCTION PANEL
ALK PHOS: 122 U/L — AB (ref 40–115)
ALT: 11 U/L (ref 9–46)
AST: 12 U/L (ref 10–35)
Albumin: 4.2 g/dL (ref 3.6–5.1)
BILIRUBIN DIRECT: 0.1 mg/dL (ref <=0.2)
BILIRUBIN INDIRECT: 0.5 mg/dL (ref 0.2–1.2)
BILIRUBIN TOTAL: 0.6 mg/dL (ref 0.2–1.2)
Total Protein: 6.8 g/dL (ref 6.1–8.1)

## 2016-01-03 ENCOUNTER — Ambulatory Visit: Payer: Medicare Other | Admitting: Cardiovascular Disease

## 2016-01-11 ENCOUNTER — Encounter (INDEPENDENT_AMBULATORY_CARE_PROVIDER_SITE_OTHER): Payer: Medicare Other | Admitting: Ophthalmology

## 2016-01-11 DIAGNOSIS — H43813 Vitreous degeneration, bilateral: Secondary | ICD-10-CM | POA: Diagnosis not present

## 2016-01-11 DIAGNOSIS — I10 Essential (primary) hypertension: Secondary | ICD-10-CM | POA: Diagnosis not present

## 2016-01-11 DIAGNOSIS — H353231 Exudative age-related macular degeneration, bilateral, with active choroidal neovascularization: Secondary | ICD-10-CM

## 2016-01-11 DIAGNOSIS — H35033 Hypertensive retinopathy, bilateral: Secondary | ICD-10-CM

## 2016-01-15 DIAGNOSIS — E782 Mixed hyperlipidemia: Secondary | ICD-10-CM | POA: Diagnosis not present

## 2016-01-15 DIAGNOSIS — E781 Pure hyperglyceridemia: Secondary | ICD-10-CM | POA: Diagnosis not present

## 2016-01-16 ENCOUNTER — Telehealth: Payer: Self-pay | Admitting: Cardiology

## 2016-01-16 MED ORDER — AMLODIPINE BESYLATE 5 MG PO TABS: 5.0000 mg | ORAL_TABLET | Freq: Every day | ORAL | 3 refills | Status: DC

## 2016-01-16 NOTE — Telephone Encounter (Signed)
Rx(s) sent to pharmacy electronically.  

## 2016-01-16 NOTE — Telephone Encounter (Signed)
New message       *STAT* If patient is at the pharmacy, call can be transferred to refill team.   1. Which medications need to be refilled? (please list name of each medication and dose if known) Norvasc 5 mg po daily  2. Which pharmacy/location (including street and city if local pharmacy) is medication to be sent to?[facility]  3. Do they need a 30 day or 90 day supply? 90 days supply

## 2016-01-17 ENCOUNTER — Other Ambulatory Visit: Payer: Self-pay | Admitting: Cardiology

## 2016-01-17 DIAGNOSIS — H401212 Low-tension glaucoma, right eye, moderate stage: Secondary | ICD-10-CM | POA: Diagnosis not present

## 2016-01-17 DIAGNOSIS — H353212 Exudative age-related macular degeneration, right eye, with inactive choroidal neovascularization: Secondary | ICD-10-CM | POA: Diagnosis not present

## 2016-01-17 DIAGNOSIS — H401223 Low-tension glaucoma, left eye, severe stage: Secondary | ICD-10-CM | POA: Diagnosis not present

## 2016-01-17 DIAGNOSIS — H353221 Exudative age-related macular degeneration, left eye, with active choroidal neovascularization: Secondary | ICD-10-CM | POA: Diagnosis not present

## 2016-01-17 MED ORDER — METOPROLOL SUCCINATE ER 25 MG PO TB24: 75.0000 mg | ORAL_TABLET | Freq: Every day | ORAL | 10 refills | Status: DC

## 2016-01-17 NOTE — Telephone Encounter (Signed)
Rx(s) sent to pharmacy electronically.  

## 2016-03-07 ENCOUNTER — Other Ambulatory Visit: Payer: Self-pay

## 2016-03-07 MED ORDER — CLOPIDOGREL BISULFATE 75 MG PO TABS: 75.0000 mg | ORAL_TABLET | Freq: Every day | ORAL | 3 refills | Status: DC

## 2016-03-28 ENCOUNTER — Telehealth: Payer: Self-pay

## 2016-03-28 ENCOUNTER — Encounter (INDEPENDENT_AMBULATORY_CARE_PROVIDER_SITE_OTHER): Payer: Medicare Other | Admitting: Ophthalmology

## 2016-03-28 DIAGNOSIS — H43813 Vitreous degeneration, bilateral: Secondary | ICD-10-CM | POA: Diagnosis not present

## 2016-03-28 DIAGNOSIS — H35033 Hypertensive retinopathy, bilateral: Secondary | ICD-10-CM | POA: Diagnosis not present

## 2016-03-28 DIAGNOSIS — H353231 Exudative age-related macular degeneration, bilateral, with active choroidal neovascularization: Secondary | ICD-10-CM

## 2016-03-28 DIAGNOSIS — I10 Essential (primary) hypertension: Secondary | ICD-10-CM | POA: Diagnosis not present

## 2016-03-28 NOTE — Telephone Encounter (Signed)
Received clearance form from [facility].Dr.Jordan advised ok to hold Eliquis 48 hours prior to 8 extractions.Form faxed back to fax # [phone].

## 2016-04-01 DIAGNOSIS — R634 Abnormal weight loss: Secondary | ICD-10-CM | POA: Diagnosis not present

## 2016-04-01 DIAGNOSIS — Z131 Encounter for screening for diabetes mellitus: Secondary | ICD-10-CM | POA: Diagnosis not present

## 2016-04-01 DIAGNOSIS — R58 Hemorrhage, not elsewhere classified: Secondary | ICD-10-CM | POA: Diagnosis not present

## 2016-04-05 ENCOUNTER — Telehealth: Payer: Self-pay | Admitting: Cardiology

## 2016-04-05 NOTE — Telephone Encounter (Signed)
New message   Pt wife is calling because she wants to know when pt can resume his Plavix 75mg  1x day and the Eliquis 5mg  2xday

## 2016-04-05 NOTE — Telephone Encounter (Signed)
Returned call and patient's wife said he had extractions on 04/03/16 and was seen back at the dentist today and received a good report. However, they did not ask about restarting the medications.  Advised her to call the dentist to advise first and call us back if Dr Martinique needed to advise.

## 2016-04-17 ENCOUNTER — Telehealth: Payer: Self-pay | Admitting: Cardiology

## 2016-04-17 NOTE — Telephone Encounter (Signed)
New message  Per pt's wife, needs Dominic [name] to approve request for new cpap machine through [facility]  Please contact [place] for approval

## 2016-04-18 NOTE — Telephone Encounter (Signed)
Spoke to patients wife, she will speak to [facility] and give correct information

## 2016-04-18 NOTE — Telephone Encounter (Signed)
04/18/16  Left message for patient  to call back and clarify the need.

## 2016-05-21 ENCOUNTER — Other Ambulatory Visit: Payer: Self-pay | Admitting: Cardiology

## 2016-05-21 DIAGNOSIS — C61 Malignant neoplasm of prostate: Secondary | ICD-10-CM | POA: Diagnosis not present

## 2016-05-23 ENCOUNTER — Other Ambulatory Visit: Payer: Self-pay | Admitting: *Deleted

## 2016-05-24 DIAGNOSIS — C61 Malignant neoplasm of prostate: Secondary | ICD-10-CM | POA: Diagnosis not present

## 2016-05-28 DIAGNOSIS — H401223 Low-tension glaucoma, left eye, severe stage: Secondary | ICD-10-CM | POA: Diagnosis not present

## 2016-05-28 DIAGNOSIS — H40053 Ocular hypertension, bilateral: Secondary | ICD-10-CM | POA: Diagnosis not present

## 2016-05-28 DIAGNOSIS — H401212 Low-tension glaucoma, right eye, moderate stage: Secondary | ICD-10-CM | POA: Diagnosis not present

## 2016-06-06 ENCOUNTER — Encounter (INDEPENDENT_AMBULATORY_CARE_PROVIDER_SITE_OTHER): Payer: Medicare Other | Admitting: Ophthalmology

## 2016-06-06 DIAGNOSIS — H43813 Vitreous degeneration, bilateral: Secondary | ICD-10-CM

## 2016-06-06 DIAGNOSIS — H353231 Exudative age-related macular degeneration, bilateral, with active choroidal neovascularization: Secondary | ICD-10-CM | POA: Diagnosis not present

## 2016-06-06 DIAGNOSIS — I10 Essential (primary) hypertension: Secondary | ICD-10-CM | POA: Diagnosis not present

## 2016-06-06 DIAGNOSIS — H35033 Hypertensive retinopathy, bilateral: Secondary | ICD-10-CM | POA: Diagnosis not present

## 2016-06-17 NOTE — Progress Notes (Signed)
Cardiology Office Note   Date:  06/18/2016   ID:  Dorrell, Rothstein [birth year], MRN [id number]  PCP:  Melinda Crutch, MD  Cardiologist: Lamyra Malcolm Martinique MD  Chief Complaint  Patient presents with  . Coronary Artery Disease      History of Present Illness: Dominic [name] is a 83 y.o. male who is seen for follow up CAD and Afib. He is a former patient of Dr. Mare Ferrari.   He has had known episodes of paroxysmal atrial fibrillation since June 2015. He has been on long-term anticoagulation with Eliquis.   The patient has a history of known coronary disease. He has had prior stents.with  BMS to the RCA and subsequent Cypher DES to the RCA in 3/06. LHC in 12/06 demonstrated significant RCA ISR which was treated with a Taxus DES. Other history includes HTN, HL, sleep apnea, PAF.   Admitted in 6/15 with AF with RVR. Inpatient Myoview was low risk.   On 03/09/15 the patient presented to the emergency room with a non-STEMI. He underwent cath on 03/10/2015 w/ successful PCI to the mid-RCA for in-stent restenosis with cutting balloon angioplasty and PCI to an OM2 lesion. There was residual LAD/D1 bifurcation lesion and staged PCI was done 03/13/2015. Following this procedure the patient developed ataxia, speech difficulties and was diagnosed with embolic CVA.   On follow up today he still notes mild  residual from his CVA with a shuffling gait and some change in speech.  He is still  working for [employer]. He denies any chest pain.He has been eating a plant based diet now and has lost weight. He is intolerant of statins. Had  extensive dental extractions due to extensive decay and now has dentures. Overall he feels very well.   Past Medical History:  Diagnosis Date  . Amputated finger    a. L index d/t to dog bite.  . Atrial fibrillation with RVR ([place])    a. New onset diagnosed 11/20/2013, spont converted to NSR.  Marland Kitchen Concussion   . Coronary artery disease 09/2004, 04/2005, 02/2015   a. Stent to  prox and mid RCA 08/2001. b. DES to RCA for ISR 08/2004. c. DES to [facility] for ISR 05/2005. d. Low risk nuc 10/2013 (done for CP in setting of new AF). e. PCI to the mid-RCA for in-stent restenosis with cutting balloon angioplasty f. PCI to an OM2 lesion  . Dyslipidemia    a. Intol of statins.  . Hypertension   . Lung nodule    , right upper lobe  . Obstructive sleep apnea   . OSA (obstructive sleep apnea)   . Prostate cancer ([place])   . RBBB (right bundle branch block)     Past Surgical History:  Procedure Laterality Date  . ANTERIOR CRUCIATE LIGAMENT REPAIR Right   . CARDIAC CATHETERIZATION N/A 03/10/2015   Procedure: Left Heart Cath and Coronary Angiography;  Surgeon: Lorretta Harp, MD;  Location: [place] [facility];  Service: Cardiovascular;  Laterality: N/A;  . CARDIAC CATHETERIZATION  03/13/2015   Procedure: Coronary Stent Intervention;  Surgeon: Arryana Tolleson M Martinique, MD;  Location: [place] [facility];  Service: Cardiovascular;;  . CARDIAC CATHETERIZATION  03/13/2015   Procedure: Intravascular Pressure Wire/FFR Study;  Surgeon: Zohan Shiflet M Martinique, MD;  Location: [place] [facility];  Service: Cardiovascular;;  . CORONARY STENT PLACEMENT    . L knee ligament replacement    . PROSTATECTOMY    . TONSILLECTOMY    . TOOTH EXTRACTION  Current Outpatient Prescriptions  Medication Sig Dispense Refill  . amLODipine (NORVASC) 5 MG tablet Take 1 tablet (5 mg total) by mouth daily. 90 tablet 3  . clopidogrel (PLAVIX) 75 MG tablet Take 1 tablet (75 mg total) by mouth daily. 90 tablet 3  . ELIQUIS 5 MG TABS tablet TAKE 1 TABLET (5 MG TOTAL) BY MOUTH 2 (TWO) TIMES DAILY. 180 tablet 1  . metoprolol succinate (TOPROL-XL) 25 MG 24 hr tablet Take 3 tablets (75 mg total) by mouth daily. 90 tablet 10  . Multiple Vitamins-Minerals (PRESERVISION AREDS) CAPS Take 1 capsule by mouth 2 (two) times daily.    . nitroGLYCERIN (NITROSTAT) 0.4 MG SL tablet PLACE ONE TABLET UNDER THE TONGUE EVERY 5 MINUTES AS NEEDED FOR  CHEST PAIN 25 tablet 3  . NON FORMULARY as directed. tocotrienols are vitamin E supplement, Lutein-zeaxanthis, and piracetam    . Piracetam POWD Take 2 scoop by mouth daily.     . Psyllium (METAMUCIL PO) Take 15 mLs by mouth daily. Mix in water and drink    . Ubiquinol 100 MG CAPS Take 100 mg by mouth 2 (two) times daily.     Marland Kitchen VIGAMOX 0.5 % ophthalmic solution INSTILL ONE DROP INTO LEFT EYE 4 TIMES A DAY FOR 2 DAYS AFTER EACH MONTHLY EYE INJECTION  12   No current facility-administered medications for this visit.     Allergies:   Statins and Zetia [ezetimibe]    Social History:  The patient  reports that he has never smoked. He has never used smokeless tobacco. He reports that he does not drink alcohol or use drugs.   Family History:  The patient's family history includes Cancer in his father; Emphysema in his mother.    ROS:  Please see the history of present illness.   Otherwise, review of systems are positive for none.   All other systems are reviewed and negative.    PHYSICAL EXAM: VS:  BP 138/70   Pulse 74   Ht 6' (1.829 m)   Wt 184 lb (83.5 kg)   BMI 24.95 kg/m  , BMI Body mass index is 24.95 kg/m. GEN: Well nourished, well developed, in no acute distress  HEENT: poor dentition Neck: no JVD, carotid bruits, or masses Cardiac: RRR; no murmurs, rubs, or gallops,no edema  Respiratory:  clear to auscultation bilaterally, normal work of breathing GI: soft, nontender, nondistended, + BS MS: no deformity or atrophy  Skin: warm and dry, no rash Neuro:  Strength and sensation are intact Psych: euthymic mood, full affect   EKG:  EKG is not ordered today.   Recent Labs: 11/27/2015: ALT 11; BUN 19; Creat 0.74; Potassium 4.3; Sodium 140    Lipid Panel    Component Value Date/Time   CHOL 186 11/27/2015 0919   TRIG 246 (H) 11/27/2015 0919   HDL 29 (L) 11/27/2015 0919   CHOLHDL 6.4 (H) 11/27/2015 0919   VLDL 49 (H) 11/27/2015 0919   LDLCALC 108 11/27/2015 0919    LDLDIRECT 146.0 12/28/2014 1057      Wt Readings from Last 3 Encounters:  06/18/16 184 lb (83.5 kg)  11/23/15 185 lb (83.9 kg)  07/20/15 192 lb 6.4 oz (87.3 kg)        ASSESSMENT AND PLAN:  1.  Coronary artery disease. History of multiple stents as noted above, most recently 03/10/15 with PTCA of the RCA for instent restenosis, DES of OM, and LAD stenting. Continue long-term Plavix. No ASA since on Eliquis.  2.  Paroxysmal atrial fibrillation, maintaining normal sinus rhythm.  Continue long-term Apixaban for CHA2DS2-VASc Score  4 3. CVA- embolic post cath procedure.  4.  Hypercholesterolemia, intolerant of statins. May be a candidate for one of our research trials for lipid lowering therapy. I will have research contact him to discuss. 5.  Essential hypertension, controlled on current medication 6.  History of sleep apnea, patient is on CPAP therapy with symptomatic improvement.  Follow up in 6 months.    Current medicines are reviewed at length with the patient today.  The patient does not have concerns regarding medicines.  The following changes have been made:  no change  Labs/ tests ordered today include:   No orders of the defined types were placed in this encounter.   Signed, Crystelle Ferrufino Martinique MD, [facility]    06/18/2016 3:47 PM    [place]

## 2016-06-18 ENCOUNTER — Ambulatory Visit (INDEPENDENT_AMBULATORY_CARE_PROVIDER_SITE_OTHER): Payer: Medicare Other | Admitting: Cardiology

## 2016-06-18 ENCOUNTER — Encounter: Payer: Self-pay | Admitting: Cardiology

## 2016-06-18 VITALS — BP 138/70 | HR 74 | Ht 72.0 in | Wt 184.0 lb

## 2016-06-18 DIAGNOSIS — I480 Paroxysmal atrial fibrillation: Secondary | ICD-10-CM

## 2016-06-18 DIAGNOSIS — Z7901 Long term (current) use of anticoagulants: Secondary | ICD-10-CM | POA: Diagnosis not present

## 2016-06-18 DIAGNOSIS — I2510 Atherosclerotic heart disease of native coronary artery without angina pectoris: Secondary | ICD-10-CM

## 2016-06-18 DIAGNOSIS — E7800 Pure hypercholesterolemia, unspecified: Secondary | ICD-10-CM

## 2016-06-18 DIAGNOSIS — I4510 Unspecified right bundle-branch block: Secondary | ICD-10-CM

## 2016-06-18 DIAGNOSIS — Z9861 Coronary angioplasty status: Secondary | ICD-10-CM

## 2016-06-18 NOTE — Patient Instructions (Signed)
Continue your current therapy  I will have our research nurse contact you concerning clinical trial for lipid lowering medication  I will see you in 6 months.

## 2016-07-10 ENCOUNTER — Telehealth: Payer: Self-pay

## 2016-07-10 NOTE — Telephone Encounter (Signed)
The CLEAR Research Study, for patients who are intolerant to statins, was discussed with Dominic [name] and his wife. He does not wish to participate in the study.

## 2016-07-11 DIAGNOSIS — M9903 Segmental and somatic dysfunction of lumbar region: Secondary | ICD-10-CM | POA: Diagnosis not present

## 2016-07-11 DIAGNOSIS — M9904 Segmental and somatic dysfunction of sacral region: Secondary | ICD-10-CM | POA: Diagnosis not present

## 2016-07-11 DIAGNOSIS — M542 Cervicalgia: Secondary | ICD-10-CM | POA: Diagnosis not present

## 2016-07-11 DIAGNOSIS — M545 Low back pain: Secondary | ICD-10-CM | POA: Diagnosis not present

## 2016-07-22 DIAGNOSIS — M542 Cervicalgia: Secondary | ICD-10-CM | POA: Diagnosis not present

## 2016-07-22 DIAGNOSIS — M9904 Segmental and somatic dysfunction of sacral region: Secondary | ICD-10-CM | POA: Diagnosis not present

## 2016-07-22 DIAGNOSIS — M9903 Segmental and somatic dysfunction of lumbar region: Secondary | ICD-10-CM | POA: Diagnosis not present

## 2016-07-22 DIAGNOSIS — M545 Low back pain: Secondary | ICD-10-CM | POA: Diagnosis not present

## 2016-07-25 DIAGNOSIS — H6123 Impacted cerumen, bilateral: Secondary | ICD-10-CM | POA: Diagnosis not present

## 2016-08-21 ENCOUNTER — Encounter (INDEPENDENT_AMBULATORY_CARE_PROVIDER_SITE_OTHER): Payer: Medicare Other | Admitting: Ophthalmology

## 2016-08-21 DIAGNOSIS — H43813 Vitreous degeneration, bilateral: Secondary | ICD-10-CM

## 2016-08-21 DIAGNOSIS — H353231 Exudative age-related macular degeneration, bilateral, with active choroidal neovascularization: Secondary | ICD-10-CM

## 2016-08-21 DIAGNOSIS — I10 Essential (primary) hypertension: Secondary | ICD-10-CM

## 2016-08-21 DIAGNOSIS — H35033 Hypertensive retinopathy, bilateral: Secondary | ICD-10-CM | POA: Diagnosis not present

## 2016-09-01 ENCOUNTER — Encounter: Payer: Self-pay | Admitting: Neurology

## 2016-09-07 DIAGNOSIS — E782 Mixed hyperlipidemia: Secondary | ICD-10-CM | POA: Diagnosis not present

## 2016-11-07 DIAGNOSIS — Z8673 Personal history of transient ischemic attack (TIA), and cerebral infarction without residual deficits: Secondary | ICD-10-CM | POA: Diagnosis not present

## 2016-11-07 DIAGNOSIS — R2681 Unsteadiness on feet: Secondary | ICD-10-CM | POA: Diagnosis not present

## 2016-11-20 DIAGNOSIS — Z8673 Personal history of transient ischemic attack (TIA), and cerebral infarction without residual deficits: Secondary | ICD-10-CM | POA: Diagnosis not present

## 2016-11-20 DIAGNOSIS — R2681 Unsteadiness on feet: Secondary | ICD-10-CM | POA: Diagnosis not present

## 2016-11-21 ENCOUNTER — Other Ambulatory Visit: Payer: Self-pay | Admitting: Cardiology

## 2016-11-26 DIAGNOSIS — H401212 Low-tension glaucoma, right eye, moderate stage: Secondary | ICD-10-CM | POA: Diagnosis not present

## 2016-11-26 DIAGNOSIS — H353212 Exudative age-related macular degeneration, right eye, with inactive choroidal neovascularization: Secondary | ICD-10-CM | POA: Diagnosis not present

## 2016-11-26 DIAGNOSIS — H353221 Exudative age-related macular degeneration, left eye, with active choroidal neovascularization: Secondary | ICD-10-CM | POA: Diagnosis not present

## 2016-11-26 DIAGNOSIS — H401223 Low-tension glaucoma, left eye, severe stage: Secondary | ICD-10-CM | POA: Diagnosis not present

## 2016-11-27 ENCOUNTER — Encounter: Payer: Self-pay | Admitting: Rehabilitation

## 2016-11-27 ENCOUNTER — Ambulatory Visit: Payer: Medicare Other | Attending: Family Medicine | Admitting: Rehabilitation

## 2016-11-27 DIAGNOSIS — M6281 Muscle weakness (generalized): Secondary | ICD-10-CM | POA: Diagnosis not present

## 2016-11-27 DIAGNOSIS — R2689 Other abnormalities of gait and mobility: Secondary | ICD-10-CM | POA: Diagnosis not present

## 2016-11-27 DIAGNOSIS — R296 Repeated falls: Secondary | ICD-10-CM | POA: Diagnosis not present

## 2016-11-27 DIAGNOSIS — R262 Difficulty in walking, not elsewhere classified: Secondary | ICD-10-CM | POA: Insufficient documentation
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
## 2016-11-28 ENCOUNTER — Ambulatory Visit: Payer: Medicare Other | Admitting: Physical Therapy

## 2016-11-28 ENCOUNTER — Other Ambulatory Visit: Payer: Self-pay | Admitting: Cardiology

## 2016-12-03 ENCOUNTER — Ambulatory Visit: Payer: Medicare Other | Admitting: Physical Therapy

## 2016-12-03 DIAGNOSIS — R2689 Other abnormalities of gait and mobility: Secondary | ICD-10-CM

## 2016-12-03 DIAGNOSIS — R296 Repeated falls: Secondary | ICD-10-CM | POA: Diagnosis not present

## 2016-12-03 DIAGNOSIS — M6281 Muscle weakness (generalized): Secondary | ICD-10-CM

## 2016-12-03 DIAGNOSIS — R262 Difficulty in walking, not elsewhere classified: Secondary | ICD-10-CM | POA: Diagnosis not present
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
## 2016-12-04 ENCOUNTER — Encounter (INDEPENDENT_AMBULATORY_CARE_PROVIDER_SITE_OTHER): Payer: Medicare Other | Admitting: Ophthalmology

## 2016-12-04 DIAGNOSIS — I10 Essential (primary) hypertension: Secondary | ICD-10-CM | POA: Diagnosis not present

## 2016-12-04 DIAGNOSIS — H35033 Hypertensive retinopathy, bilateral: Secondary | ICD-10-CM

## 2016-12-04 DIAGNOSIS — H353231 Exudative age-related macular degeneration, bilateral, with active choroidal neovascularization: Secondary | ICD-10-CM

## 2016-12-04 DIAGNOSIS — H43813 Vitreous degeneration, bilateral: Secondary | ICD-10-CM | POA: Diagnosis not present

## 2016-12-05 ENCOUNTER — Ambulatory Visit: Payer: Medicare Other | Admitting: Physical Therapy

## 2016-12-05 ENCOUNTER — Encounter: Payer: Self-pay | Admitting: Physical Therapy

## 2016-12-05 DIAGNOSIS — M6281 Muscle weakness (generalized): Secondary | ICD-10-CM | POA: Diagnosis not present

## 2016-12-05 DIAGNOSIS — R2689 Other abnormalities of gait and mobility: Secondary | ICD-10-CM

## 2016-12-05 DIAGNOSIS — R262 Difficulty in walking, not elsewhere classified: Secondary | ICD-10-CM | POA: Diagnosis not present

## 2016-12-05 DIAGNOSIS — R296 Repeated falls: Secondary | ICD-10-CM
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
## 2016-12-09 ENCOUNTER — Ambulatory Visit: Payer: Medicare Other | Admitting: Physical Therapy

## 2016-12-09 ENCOUNTER — Encounter: Payer: Self-pay | Admitting: Physical Therapy

## 2016-12-09 DIAGNOSIS — R296 Repeated falls: Secondary | ICD-10-CM

## 2016-12-09 DIAGNOSIS — R262 Difficulty in walking, not elsewhere classified: Secondary | ICD-10-CM

## 2016-12-09 DIAGNOSIS — R2689 Other abnormalities of gait and mobility: Secondary | ICD-10-CM | POA: Diagnosis not present

## 2016-12-09 DIAGNOSIS — M6281 Muscle weakness (generalized): Secondary | ICD-10-CM | POA: Diagnosis not present
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
## 2016-12-12 ENCOUNTER — Ambulatory Visit: Payer: Medicare Other | Admitting: Physical Therapy

## 2016-12-12 ENCOUNTER — Encounter: Payer: Self-pay | Admitting: Physical Therapy

## 2016-12-12 DIAGNOSIS — R262 Difficulty in walking, not elsewhere classified: Secondary | ICD-10-CM | POA: Diagnosis not present

## 2016-12-12 DIAGNOSIS — R296 Repeated falls: Secondary | ICD-10-CM

## 2016-12-12 DIAGNOSIS — R2689 Other abnormalities of gait and mobility: Secondary | ICD-10-CM | POA: Diagnosis not present

## 2016-12-12 DIAGNOSIS — M6281 Muscle weakness (generalized): Secondary | ICD-10-CM | POA: Diagnosis not present
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
## 2016-12-17 ENCOUNTER — Encounter: Payer: Self-pay | Admitting: *Deleted

## 2016-12-17 ENCOUNTER — Ambulatory Visit: Payer: Medicare Other | Attending: Family Medicine | Admitting: *Deleted

## 2016-12-17 DIAGNOSIS — M6281 Muscle weakness (generalized): Secondary | ICD-10-CM | POA: Insufficient documentation

## 2016-12-17 DIAGNOSIS — R296 Repeated falls: Secondary | ICD-10-CM | POA: Insufficient documentation

## 2016-12-17 DIAGNOSIS — R41841 Cognitive communication deficit: Secondary | ICD-10-CM | POA: Insufficient documentation

## 2016-12-17 DIAGNOSIS — R29898 Other symptoms and signs involving the musculoskeletal system: Secondary | ICD-10-CM | POA: Diagnosis not present

## 2016-12-17 DIAGNOSIS — R262 Difficulty in walking, not elsewhere classified: Secondary | ICD-10-CM | POA: Insufficient documentation

## 2016-12-17 DIAGNOSIS — R2689 Other abnormalities of gait and mobility: Secondary | ICD-10-CM | POA: Insufficient documentation
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
## 2016-12-20 ENCOUNTER — Encounter: Payer: Self-pay | Admitting: Physical Therapy

## 2016-12-20 ENCOUNTER — Ambulatory Visit: Payer: Medicare Other | Admitting: Physical Therapy

## 2016-12-20 ENCOUNTER — Ambulatory Visit: Payer: Medicare Other | Admitting: *Deleted

## 2016-12-20 DIAGNOSIS — R262 Difficulty in walking, not elsewhere classified: Secondary | ICD-10-CM

## 2016-12-20 DIAGNOSIS — R2689 Other abnormalities of gait and mobility: Secondary | ICD-10-CM | POA: Diagnosis not present

## 2016-12-20 DIAGNOSIS — M6281 Muscle weakness (generalized): Secondary | ICD-10-CM | POA: Diagnosis not present

## 2016-12-20 DIAGNOSIS — R29898 Other symptoms and signs involving the musculoskeletal system: Secondary | ICD-10-CM

## 2016-12-20 DIAGNOSIS — R296 Repeated falls: Secondary | ICD-10-CM | POA: Diagnosis not present

## 2016-12-20 DIAGNOSIS — R41841 Cognitive communication deficit: Secondary | ICD-10-CM
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
## 2016-12-20 NOTE — Patient Instructions (Signed)
  Go shopping for a small appointment book. Use pencil when writing in it  Things to write in appointment book for JIM to remember Medications Appointments - doctors, PT, [place] related events/activities Therapy exercises Activities to keep busy - gym workout Cognitive activities on the computer - Lumosity (refer to other ideas on handout)  Improving attention Minimize distractions Take notes Read when you're not tired Large print Good lighting  The next time you are reading, pay attention to what distracts you Think about what you can change or start doing to help you  See how long you can read without becoming distracted
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
## 2016-12-23 ENCOUNTER — Encounter: Payer: Self-pay | Admitting: Physical Therapy

## 2016-12-23 ENCOUNTER — Ambulatory Visit: Payer: Medicare Other | Admitting: Physical Therapy

## 2016-12-23 ENCOUNTER — Ambulatory Visit: Payer: Medicare Other

## 2016-12-23 DIAGNOSIS — R41841 Cognitive communication deficit: Secondary | ICD-10-CM | POA: Diagnosis not present

## 2016-12-23 DIAGNOSIS — R29898 Other symptoms and signs involving the musculoskeletal system: Secondary | ICD-10-CM | POA: Diagnosis not present

## 2016-12-23 DIAGNOSIS — R296 Repeated falls: Secondary | ICD-10-CM

## 2016-12-23 DIAGNOSIS — R262 Difficulty in walking, not elsewhere classified: Secondary | ICD-10-CM

## 2016-12-23 DIAGNOSIS — M6281 Muscle weakness (generalized): Secondary | ICD-10-CM

## 2016-12-23 DIAGNOSIS — R2689 Other abnormalities of gait and mobility: Secondary | ICD-10-CM
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
## 2016-12-23 NOTE — Patient Instructions (Addendum)
 *  Have your wife put your to-do list on the white board on the frig instead of next to your computer.  *Keep a piece of paper/pen next to you when you read, to jot down ideas that come into your head and then KEEP READING! *Reading out loud helps focus during reading
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
## 2016-12-25 ENCOUNTER — Encounter: Payer: Self-pay | Admitting: Physical Therapy

## 2016-12-25 ENCOUNTER — Ambulatory Visit: Payer: Medicare Other | Admitting: Physical Therapy

## 2016-12-25 DIAGNOSIS — R262 Difficulty in walking, not elsewhere classified: Secondary | ICD-10-CM | POA: Diagnosis not present

## 2016-12-25 DIAGNOSIS — R41841 Cognitive communication deficit: Secondary | ICD-10-CM

## 2016-12-25 DIAGNOSIS — R29898 Other symptoms and signs involving the musculoskeletal system: Secondary | ICD-10-CM | POA: Diagnosis not present

## 2016-12-25 DIAGNOSIS — M6281 Muscle weakness (generalized): Secondary | ICD-10-CM | POA: Diagnosis not present

## 2016-12-25 DIAGNOSIS — R2689 Other abnormalities of gait and mobility: Secondary | ICD-10-CM

## 2016-12-25 DIAGNOSIS — R296 Repeated falls: Secondary | ICD-10-CM
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
## 2016-12-27 ENCOUNTER — Ambulatory Visit: Payer: Medicare Other | Admitting: *Deleted

## 2016-12-27 DIAGNOSIS — R296 Repeated falls: Secondary | ICD-10-CM | POA: Diagnosis not present

## 2016-12-27 DIAGNOSIS — M6281 Muscle weakness (generalized): Secondary | ICD-10-CM | POA: Diagnosis not present

## 2016-12-27 DIAGNOSIS — R41841 Cognitive communication deficit: Secondary | ICD-10-CM

## 2016-12-27 DIAGNOSIS — R262 Difficulty in walking, not elsewhere classified: Secondary | ICD-10-CM | POA: Diagnosis not present

## 2016-12-27 DIAGNOSIS — R2689 Other abnormalities of gait and mobility: Secondary | ICD-10-CM | POA: Diagnosis not present

## 2016-12-27 DIAGNOSIS — R29898 Other symptoms and signs involving the musculoskeletal system: Secondary | ICD-10-CM | POA: Diagnosis not present
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
## 2016-12-27 NOTE — Patient Instructions (Signed)
  What MOTIVATES you? Chocolate kisses YouTube videos [employer] Chipotle Cookies [employer] chocolate chip cookies HT muffins  TO DO list 1. Put a reminder note on bathroom mirror to take meds 2. Put notes asking "did you take your meds" wherever you need to 3. Establishing new routines, since you aren't working any more 4. Add important events, activities to your to do list 5. New routines aren't bad, and it doesn't have to be difficult or without fun! 6. Gauge your energy level - take breaks if you get tired 7. Complete more difficult/challenging/physical tasks earlier in the day 8. Work on physical therapy exercises to build strength and endurance 9. If you need structure, then structure your day with activities/tasks that you need to complete, including time frames 10. Ask PT for specific exercises you can do at home

## 2016-12-31 ENCOUNTER — Encounter: Payer: Self-pay | Admitting: Physical Therapy

## 2016-12-31 ENCOUNTER — Ambulatory Visit: Payer: Medicare Other | Admitting: Physical Therapy

## 2016-12-31 DIAGNOSIS — R29898 Other symptoms and signs involving the musculoskeletal system: Secondary | ICD-10-CM | POA: Diagnosis not present

## 2016-12-31 DIAGNOSIS — R262 Difficulty in walking, not elsewhere classified: Secondary | ICD-10-CM

## 2016-12-31 DIAGNOSIS — M6281 Muscle weakness (generalized): Secondary | ICD-10-CM

## 2016-12-31 DIAGNOSIS — R296 Repeated falls: Secondary | ICD-10-CM | POA: Diagnosis not present

## 2016-12-31 DIAGNOSIS — R2689 Other abnormalities of gait and mobility: Secondary | ICD-10-CM

## 2016-12-31 DIAGNOSIS — R41841 Cognitive communication deficit: Secondary | ICD-10-CM | POA: Diagnosis not present
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
## 2017-01-02 ENCOUNTER — Ambulatory Visit: Payer: Medicare Other | Admitting: Physical Therapy

## 2017-01-02 ENCOUNTER — Encounter: Payer: Self-pay | Admitting: Physical Therapy

## 2017-01-02 DIAGNOSIS — R2689 Other abnormalities of gait and mobility: Secondary | ICD-10-CM

## 2017-01-02 DIAGNOSIS — R296 Repeated falls: Secondary | ICD-10-CM

## 2017-01-02 DIAGNOSIS — M6281 Muscle weakness (generalized): Secondary | ICD-10-CM

## 2017-01-02 DIAGNOSIS — R262 Difficulty in walking, not elsewhere classified: Secondary | ICD-10-CM | POA: Diagnosis not present

## 2017-01-02 DIAGNOSIS — R29898 Other symptoms and signs involving the musculoskeletal system: Secondary | ICD-10-CM | POA: Diagnosis not present

## 2017-01-02 DIAGNOSIS — R41841 Cognitive communication deficit: Secondary | ICD-10-CM | POA: Diagnosis not present
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
## 2017-01-07 ENCOUNTER — Ambulatory Visit: Payer: Medicare Other | Admitting: Physical Therapy

## 2017-01-07 ENCOUNTER — Ambulatory Visit: Payer: Medicare Other | Admitting: Speech Pathology

## 2017-01-07 ENCOUNTER — Encounter: Payer: Self-pay | Admitting: Physical Therapy

## 2017-01-07 DIAGNOSIS — R2689 Other abnormalities of gait and mobility: Secondary | ICD-10-CM | POA: Diagnosis not present

## 2017-01-07 DIAGNOSIS — R296 Repeated falls: Secondary | ICD-10-CM | POA: Diagnosis not present

## 2017-01-07 DIAGNOSIS — R41841 Cognitive communication deficit: Secondary | ICD-10-CM

## 2017-01-07 DIAGNOSIS — R29898 Other symptoms and signs involving the musculoskeletal system: Secondary | ICD-10-CM | POA: Diagnosis not present

## 2017-01-07 DIAGNOSIS — M6281 Muscle weakness (generalized): Secondary | ICD-10-CM

## 2017-01-07 DIAGNOSIS — R262 Difficulty in walking, not elsewhere classified: Secondary | ICD-10-CM | POA: Diagnosis not present
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
## 2017-01-09 ENCOUNTER — Encounter: Payer: Self-pay | Admitting: Physical Therapy

## 2017-01-09 ENCOUNTER — Ambulatory Visit: Payer: Medicare Other | Admitting: Physical Therapy

## 2017-01-09 DIAGNOSIS — R296 Repeated falls: Secondary | ICD-10-CM

## 2017-01-09 DIAGNOSIS — R29898 Other symptoms and signs involving the musculoskeletal system: Secondary | ICD-10-CM | POA: Diagnosis not present

## 2017-01-09 DIAGNOSIS — R41841 Cognitive communication deficit: Secondary | ICD-10-CM

## 2017-01-09 DIAGNOSIS — R2689 Other abnormalities of gait and mobility: Secondary | ICD-10-CM

## 2017-01-09 DIAGNOSIS — R262 Difficulty in walking, not elsewhere classified: Secondary | ICD-10-CM | POA: Diagnosis not present

## 2017-01-09 DIAGNOSIS — M6281 Muscle weakness (generalized): Secondary | ICD-10-CM | POA: Diagnosis not present
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
## 2017-01-10 ENCOUNTER — Ambulatory Visit: Payer: Medicare Other | Admitting: *Deleted

## 2017-01-10 DIAGNOSIS — R41841 Cognitive communication deficit: Secondary | ICD-10-CM

## 2017-01-10 DIAGNOSIS — R2689 Other abnormalities of gait and mobility: Secondary | ICD-10-CM | POA: Diagnosis not present

## 2017-01-10 DIAGNOSIS — R296 Repeated falls: Secondary | ICD-10-CM | POA: Diagnosis not present

## 2017-01-10 DIAGNOSIS — M6281 Muscle weakness (generalized): Secondary | ICD-10-CM | POA: Diagnosis not present

## 2017-01-10 DIAGNOSIS — R262 Difficulty in walking, not elsewhere classified: Secondary | ICD-10-CM | POA: Diagnosis not present

## 2017-01-10 DIAGNOSIS — R29898 Other symptoms and signs involving the musculoskeletal system: Secondary | ICD-10-CM | POA: Diagnosis not present
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
## 2017-01-10 NOTE — Patient Instructions (Signed)
  Ways to improve attention/memory 1. Continue taking meds for memory (piracetam)  2. Constant Therapy tasks  3. Write lists - organize by level of importance  4. Post-It notes to remind you to take your medications or for other reminders  5. Minimize distractions during activity  6. Rest when fatigued  7. Write appointments in calendar/planner - use pencil to erase changes - decreases "mess" or visual overstimulation  8. Keep to-do list up to date, adding and deleting items as needed  9. Work with Izora Gala to recall information and use strategies  10. As you spend time at home, walk around and "take inventory" of things that need to be cleaned, organized, thrown away, etc. Write it down.

## 2017-01-14 ENCOUNTER — Encounter: Payer: Self-pay | Admitting: Physical Therapy

## 2017-01-14 ENCOUNTER — Ambulatory Visit: Payer: Medicare Other | Admitting: Speech Pathology

## 2017-01-14 ENCOUNTER — Ambulatory Visit: Payer: Medicare Other | Admitting: Physical Therapy

## 2017-01-14 DIAGNOSIS — R2689 Other abnormalities of gait and mobility: Secondary | ICD-10-CM

## 2017-01-14 DIAGNOSIS — R41841 Cognitive communication deficit: Secondary | ICD-10-CM | POA: Diagnosis not present

## 2017-01-14 DIAGNOSIS — R262 Difficulty in walking, not elsewhere classified: Secondary | ICD-10-CM

## 2017-01-14 DIAGNOSIS — R296 Repeated falls: Secondary | ICD-10-CM

## 2017-01-14 DIAGNOSIS — R29898 Other symptoms and signs involving the musculoskeletal system: Secondary | ICD-10-CM | POA: Diagnosis not present

## 2017-01-14 DIAGNOSIS — M6281 Muscle weakness (generalized): Secondary | ICD-10-CM | POA: Diagnosis not present
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
## 2017-01-17 ENCOUNTER — Other Ambulatory Visit: Payer: Self-pay | Admitting: Cardiology

## 2017-01-17 ENCOUNTER — Encounter: Payer: Self-pay | Admitting: Physical Therapy

## 2017-01-17 ENCOUNTER — Ambulatory Visit: Payer: Medicare Other | Attending: Family Medicine

## 2017-01-17 ENCOUNTER — Ambulatory Visit: Payer: Medicare Other | Admitting: Physical Therapy

## 2017-01-17 DIAGNOSIS — R262 Difficulty in walking, not elsewhere classified: Secondary | ICD-10-CM | POA: Diagnosis not present

## 2017-01-17 DIAGNOSIS — M6281 Muscle weakness (generalized): Secondary | ICD-10-CM | POA: Insufficient documentation

## 2017-01-17 DIAGNOSIS — R29898 Other symptoms and signs involving the musculoskeletal system: Secondary | ICD-10-CM | POA: Insufficient documentation

## 2017-01-17 DIAGNOSIS — R296 Repeated falls: Secondary | ICD-10-CM

## 2017-01-17 DIAGNOSIS — R41841 Cognitive communication deficit: Secondary | ICD-10-CM | POA: Diagnosis not present

## 2017-01-17 DIAGNOSIS — R2689 Other abnormalities of gait and mobility: Secondary | ICD-10-CM | POA: Diagnosis not present
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
## 2017-01-21 ENCOUNTER — Ambulatory Visit: Payer: Medicare Other | Admitting: Physical Therapy

## 2017-01-21 ENCOUNTER — Encounter: Payer: Self-pay | Admitting: Physical Therapy

## 2017-01-21 DIAGNOSIS — R29898 Other symptoms and signs involving the musculoskeletal system: Secondary | ICD-10-CM

## 2017-01-21 DIAGNOSIS — R262 Difficulty in walking, not elsewhere classified: Secondary | ICD-10-CM

## 2017-01-21 DIAGNOSIS — R296 Repeated falls: Secondary | ICD-10-CM | POA: Diagnosis not present

## 2017-01-21 DIAGNOSIS — M6281 Muscle weakness (generalized): Secondary | ICD-10-CM

## 2017-01-21 DIAGNOSIS — R41841 Cognitive communication deficit: Secondary | ICD-10-CM | POA: Diagnosis not present

## 2017-01-21 DIAGNOSIS — R2689 Other abnormalities of gait and mobility: Secondary | ICD-10-CM | POA: Diagnosis not present
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
## 2017-01-23 ENCOUNTER — Encounter: Payer: Self-pay | Admitting: Physical Therapy

## 2017-01-23 ENCOUNTER — Ambulatory Visit: Payer: Medicare Other | Admitting: Physical Therapy

## 2017-01-23 DIAGNOSIS — R2689 Other abnormalities of gait and mobility: Secondary | ICD-10-CM

## 2017-01-23 DIAGNOSIS — R296 Repeated falls: Secondary | ICD-10-CM

## 2017-01-23 DIAGNOSIS — R29898 Other symptoms and signs involving the musculoskeletal system: Secondary | ICD-10-CM | POA: Diagnosis not present

## 2017-01-23 DIAGNOSIS — R262 Difficulty in walking, not elsewhere classified: Secondary | ICD-10-CM | POA: Diagnosis not present

## 2017-01-23 DIAGNOSIS — R41841 Cognitive communication deficit: Secondary | ICD-10-CM | POA: Diagnosis not present

## 2017-01-23 DIAGNOSIS — M6281 Muscle weakness (generalized): Secondary | ICD-10-CM
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
## 2017-01-28 ENCOUNTER — Encounter: Payer: Self-pay | Admitting: Physical Therapy

## 2017-01-28 ENCOUNTER — Ambulatory Visit: Payer: Medicare Other | Admitting: Physical Therapy

## 2017-01-28 DIAGNOSIS — M6281 Muscle weakness (generalized): Secondary | ICD-10-CM | POA: Diagnosis not present

## 2017-01-28 DIAGNOSIS — R262 Difficulty in walking, not elsewhere classified: Secondary | ICD-10-CM | POA: Diagnosis not present

## 2017-01-28 DIAGNOSIS — R296 Repeated falls: Secondary | ICD-10-CM

## 2017-01-28 DIAGNOSIS — R2689 Other abnormalities of gait and mobility: Secondary | ICD-10-CM | POA: Diagnosis not present

## 2017-01-28 DIAGNOSIS — R29898 Other symptoms and signs involving the musculoskeletal system: Secondary | ICD-10-CM | POA: Diagnosis not present

## 2017-01-28 DIAGNOSIS — R41841 Cognitive communication deficit: Secondary | ICD-10-CM | POA: Diagnosis not present
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
## 2017-01-30 ENCOUNTER — Ambulatory Visit: Payer: Medicare Other | Admitting: Physical Therapy

## 2017-01-30 ENCOUNTER — Encounter: Payer: Self-pay | Admitting: Physical Therapy

## 2017-01-30 DIAGNOSIS — R41841 Cognitive communication deficit: Secondary | ICD-10-CM | POA: Diagnosis not present

## 2017-01-30 DIAGNOSIS — R296 Repeated falls: Secondary | ICD-10-CM | POA: Diagnosis not present

## 2017-01-30 DIAGNOSIS — R29898 Other symptoms and signs involving the musculoskeletal system: Secondary | ICD-10-CM

## 2017-01-30 DIAGNOSIS — R262 Difficulty in walking, not elsewhere classified: Secondary | ICD-10-CM | POA: Diagnosis not present

## 2017-01-30 DIAGNOSIS — M6281 Muscle weakness (generalized): Secondary | ICD-10-CM | POA: Diagnosis not present

## 2017-01-30 DIAGNOSIS — R2689 Other abnormalities of gait and mobility: Secondary | ICD-10-CM | POA: Diagnosis not present
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
## 2017-02-01 NOTE — Progress Notes (Signed)
Cardiology Office Note   Date:  02/03/2017   ID:  Dominic [name], Dominic [name] March 28, 1934, MRN [id number]  PCP:  Lawerance Cruel, MD  Cardiologist: Alleah Dearman Martinique MD  Chief Complaint  Patient presents with  . Coronary Artery Disease      History of Present Illness: Dominic [name] is a 83 y.o. male who is seen for follow up CAD and Afib.    He has had known episodes of paroxysmal atrial fibrillation since June 2015. He has been on long-term anticoagulation with Eliquis.   The patient has a history of known coronary disease. He has had prior stents.with  BMS to the RCA and subsequent Cypher DES to the RCA in 3/06. LHC in 12/06 demonstrated significant RCA ISR which was treated with a Taxus DES. Other history includes HTN, HL, sleep apnea, PAF.   Admitted in 6/15 with AF with RVR. Inpatient Myoview was low risk.   On 03/09/15 the patient presented to the emergency room with a non-STEMI. He underwent cath on 03/10/2015 w/ successful PCI to the mid-RCA for in-stent restenosis with cutting balloon angioplasty and PCI to an OM2 lesion. There was residual LAD/D1 bifurcation lesion and staged PCI was done 03/13/2015. Following this procedure the patient developed ataxia, speech difficulties and was diagnosed with embolic CVA.   On follow up today he still notes residual from his CVA with  gait imbalance and slurred speech. Frequent falls noted at work and he has been participating in outpatient PT and cognitive therapy.  He is now on disability. He notes minimal angina when he does something really strenuous but states this has not been a problem.  He has been eating a whole food/plant based diet now. He is intolerant of statins and Zetia.   Past Medical History:  Diagnosis Date  . Amputated finger    a. L index d/t to dog bite.  . Atrial fibrillation with RVR ([place])    a. New onset diagnosed 11/20/2013, spont converted to NSR.  Marland Kitchen Concussion   . Coronary artery disease 09/2004, 04/2005, 02/2015   a. Stent to prox and mid RCA 08/2001. b. DES to RCA for ISR 08/2004. c. DES to [facility] for ISR 05/2005. d. Low risk nuc 10/2013 (done for CP in setting of new AF). e. PCI to the mid-RCA for in-stent restenosis with cutting balloon angioplasty f. PCI to an OM2 lesion  . Dyslipidemia    a. Intol of statins.  . Hypertension   . Lung nodule    , right upper lobe  . Obstructive sleep apnea   . OSA (obstructive sleep apnea)   . Prostate cancer ([place])   . RBBB (right bundle branch block)     Past Surgical History:  Procedure Laterality Date  . ANTERIOR CRUCIATE LIGAMENT REPAIR Right   . CARDIAC CATHETERIZATION N/A 03/10/2015   Procedure: Left Heart Cath and Coronary Angiography;  Surgeon: Lorretta Harp, MD;  Location: [place] [facility];  Service: Cardiovascular;  Laterality: N/A;  . CARDIAC CATHETERIZATION  03/13/2015   Procedure: Coronary Stent Intervention;  Surgeon: Brizeyda Holtmeyer M Martinique, MD;  Location: [place] [facility];  Service: Cardiovascular;;  . CARDIAC CATHETERIZATION  03/13/2015   Procedure: Intravascular Pressure Wire/FFR Study;  Surgeon: Kamayah Pillay M Martinique, MD;  Location: [place] [facility];  Service: Cardiovascular;;  . CORONARY STENT PLACEMENT    . L knee ligament replacement    . PROSTATECTOMY    . TONSILLECTOMY    . TOOTH EXTRACTION  Current Outpatient Prescriptions  Medication Sig Dispense Refill  . amLODipine (NORVASC) 5 MG tablet TAKE 1 TABLET BY MOUTH DAILY 30 tablet 0  . clopidogrel (PLAVIX) 75 MG tablet Take 1 tablet (75 mg total) by mouth daily. 90 tablet 3  . ELIQUIS 5 MG TABS tablet TAKE 1 TABLET BY MOUTH TWICE A DAY 180 tablet 0  . metoprolol succinate (TOPROL-XL) 25 MG 24 hr tablet Take 3 tablets (75 mg total) by mouth daily. 90 tablet 10  . Multiple Vitamins-Minerals (PRESERVISION AREDS) CAPS Take 1 capsule by mouth 2 (two) times daily.    . nitroGLYCERIN (NITROSTAT) 0.4 MG SL tablet PLACE ONE TABLET UNDER THE TONGUE EVERY 5 MINUTES AS NEEDED FOR CHEST PAIN 25 tablet 3    . NON FORMULARY as directed. tocotrienols are vitamin E supplement, Lutein-zeaxanthis, and piracetam    . Piracetam POWD Take 2 scoop by mouth daily.     . Psyllium (METAMUCIL PO) Take 15 mLs by mouth daily. Mix in water and drink    . Ubiquinol 100 MG CAPS Take 100 mg by mouth 2 (two) times daily.     Marland Kitchen VIGAMOX 0.5 % ophthalmic solution INSTILL ONE DROP INTO LEFT EYE 4 TIMES A DAY FOR 2 DAYS AFTER EACH MONTHLY EYE INJECTION  12   No current facility-administered medications for this visit.     Allergies:   Statins and Zetia [ezetimibe]    Social History:  The patient  reports that he has never smoked. He has never used smokeless tobacco. He reports that he does not drink alcohol or use drugs.   Family History:  The patient's family history includes Cancer in his father; Emphysema in his mother.    ROS:  Please see the history of present illness.   Otherwise, review of systems are positive for none.   All other systems are reviewed and negative.    PHYSICAL EXAM: VS:  BP 132/66   Pulse (!) 51   Ht 5\' 11"  (1.803 m)   Wt 190 lb (86.2 kg)   BMI 26.50 kg/m  , BMI Body mass index is 26.5 kg/m. GEN: Well nourished, well developed, in no acute distress  HEENT: dental plates in place Neck: no JVD, carotid bruits, or masses Cardiac: RRR; no murmurs, rubs, or gallops,no edema  Respiratory:  clear to auscultation bilaterally, normal work of breathing GI: soft, nontender, nondistended, + BS MS: no deformity or atrophy  Skin: warm and dry, no rash Neuro:  Some slurring of speech. Gait is good.  Psych: euthymic mood, full affect   EKG:  EKG is  ordered today. NSR with RBBB. Otherwise normal. I have personally reviewed and interpreted this study.    Recent Labs: No results found for requested labs within last 8760 hours.    Lipid Panel    Component Value Date/Time   CHOL 186 11/27/2015 0919   TRIG 246 (H) 11/27/2015 0919   HDL 29 (L) 11/27/2015 0919   CHOLHDL 6.4 (H)  11/27/2015 0919   VLDL 49 (H) 11/27/2015 0919   LDLCALC 108 11/27/2015 0919   LDLDIRECT 146.0 12/28/2014 1057      Wt Readings from Last 3 Encounters:  02/03/17 190 lb (86.2 kg)  06/18/16 184 lb (83.5 kg)  11/23/15 185 lb (83.9 kg)        ASSESSMENT AND PLAN:  1.  Coronary artery disease. History of multiple stents as noted above, most recently 03/10/15 with PTCA of the RCA for instent restenosis, DES of OM, and LAD  stenting. Continue long-term Plavix. No ASA since on Eliquis. Would continue Plavix unless he has significant bleeding. 2.  Paroxysmal atrial fibrillation, maintaining normal sinus rhythm.  Continue long-term Apixaban for CHA2DS2-VASc Score  4 3. CVA- embolic post cath procedure with some persistent ataxia and slurred speech. On disability. 4.  Hypercholesterolemia, intolerant of statins and Zetia. He was considered for the CLEAR trial but declined. Recommend follow up lab work with Dr. Harrington Challenger. If lipids not at goal it would be worthwhile investigating whether his insurance would cover a PCSK 9 inhibitor such as Praluent or Repatha.  5.  Essential hypertension, controlled on current medication 6.  History of sleep apnea, patient is on CPAP therapy with symptomatic improvement.  Follow up in 6 months.    Current medicines are reviewed at length with the patient today.  The patient does not have concerns regarding medicines.  The following changes have been made:  no change  Labs/ tests ordered today include:   Orders Placed This Encounter  Procedures  . EKG 12-Lead    Signed, Wheeler Incorvaia Martinique MD, [facility]    02/03/2017 4:45 PM    [facility]

## 2017-02-03 ENCOUNTER — Encounter: Payer: Self-pay | Admitting: Cardiology

## 2017-02-03 ENCOUNTER — Ambulatory Visit (INDEPENDENT_AMBULATORY_CARE_PROVIDER_SITE_OTHER): Payer: Medicare Other | Admitting: Cardiology

## 2017-02-03 VITALS — BP 132/66 | HR 51 | Ht 71.0 in | Wt 190.0 lb

## 2017-02-03 DIAGNOSIS — I4510 Unspecified right bundle-branch block: Secondary | ICD-10-CM

## 2017-02-03 DIAGNOSIS — Z9861 Coronary angioplasty status: Secondary | ICD-10-CM | POA: Diagnosis not present

## 2017-02-03 DIAGNOSIS — I480 Paroxysmal atrial fibrillation: Secondary | ICD-10-CM

## 2017-02-03 DIAGNOSIS — E7800 Pure hypercholesterolemia, unspecified: Secondary | ICD-10-CM | POA: Diagnosis not present

## 2017-02-03 DIAGNOSIS — I2510 Atherosclerotic heart disease of native coronary artery without angina pectoris: Secondary | ICD-10-CM

## 2017-02-03 NOTE — Patient Instructions (Signed)
Continue your current therapy  You need follow up lab work with Dr. Harrington Challenger

## 2017-02-04 ENCOUNTER — Other Ambulatory Visit: Payer: Self-pay | Admitting: Cardiology

## 2017-02-04 ENCOUNTER — Encounter: Payer: Self-pay | Admitting: Physical Therapy

## 2017-02-04 ENCOUNTER — Ambulatory Visit: Payer: Medicare Other | Admitting: Physical Therapy

## 2017-02-04 DIAGNOSIS — R296 Repeated falls: Secondary | ICD-10-CM | POA: Diagnosis not present

## 2017-02-04 DIAGNOSIS — R262 Difficulty in walking, not elsewhere classified: Secondary | ICD-10-CM | POA: Diagnosis not present

## 2017-02-04 DIAGNOSIS — R29898 Other symptoms and signs involving the musculoskeletal system: Secondary | ICD-10-CM | POA: Diagnosis not present

## 2017-02-04 DIAGNOSIS — M6281 Muscle weakness (generalized): Secondary | ICD-10-CM | POA: Diagnosis not present

## 2017-02-04 DIAGNOSIS — R2689 Other abnormalities of gait and mobility: Secondary | ICD-10-CM | POA: Diagnosis not present

## 2017-02-04 DIAGNOSIS — R41841 Cognitive communication deficit: Secondary | ICD-10-CM | POA: Diagnosis not present
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
## 2017-02-05 DIAGNOSIS — E7800 Pure hypercholesterolemia, unspecified: Secondary | ICD-10-CM | POA: Diagnosis not present

## 2017-02-05 DIAGNOSIS — I69319 Unspecified symptoms and signs involving cognitive functions following cerebral infarction: Secondary | ICD-10-CM | POA: Diagnosis not present

## 2017-02-05 DIAGNOSIS — Z23 Encounter for immunization: Secondary | ICD-10-CM | POA: Diagnosis not present

## 2017-02-05 DIAGNOSIS — R2689 Other abnormalities of gait and mobility: Secondary | ICD-10-CM | POA: Diagnosis not present

## 2017-02-06 ENCOUNTER — Encounter: Payer: Self-pay | Admitting: Adult Health

## 2017-02-06 ENCOUNTER — Ambulatory Visit: Payer: Medicare Other | Admitting: Physical Therapy

## 2017-02-06 ENCOUNTER — Encounter: Payer: Self-pay | Admitting: Physical Therapy

## 2017-02-06 DIAGNOSIS — R29898 Other symptoms and signs involving the musculoskeletal system: Secondary | ICD-10-CM | POA: Diagnosis not present

## 2017-02-06 DIAGNOSIS — R262 Difficulty in walking, not elsewhere classified: Secondary | ICD-10-CM

## 2017-02-06 DIAGNOSIS — R296 Repeated falls: Secondary | ICD-10-CM | POA: Diagnosis not present

## 2017-02-06 DIAGNOSIS — R2689 Other abnormalities of gait and mobility: Secondary | ICD-10-CM | POA: Diagnosis not present

## 2017-02-06 DIAGNOSIS — R41841 Cognitive communication deficit: Secondary | ICD-10-CM | POA: Diagnosis not present

## 2017-02-06 DIAGNOSIS — M6281 Muscle weakness (generalized): Secondary | ICD-10-CM | POA: Diagnosis not present
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
## 2017-02-07 DIAGNOSIS — E7800 Pure hypercholesterolemia, unspecified: Secondary | ICD-10-CM | POA: Diagnosis not present

## 2017-02-07 DIAGNOSIS — I69319 Unspecified symptoms and signs involving cognitive functions following cerebral infarction: Secondary | ICD-10-CM | POA: Diagnosis not present

## 2017-02-07 DIAGNOSIS — R2689 Other abnormalities of gait and mobility: Secondary | ICD-10-CM | POA: Diagnosis not present

## 2017-02-11 ENCOUNTER — Encounter: Payer: Self-pay | Admitting: Physical Therapy

## 2017-02-11 ENCOUNTER — Ambulatory Visit: Payer: Medicare Other | Admitting: Physical Therapy

## 2017-02-11 DIAGNOSIS — R262 Difficulty in walking, not elsewhere classified: Secondary | ICD-10-CM | POA: Diagnosis not present

## 2017-02-11 DIAGNOSIS — R2689 Other abnormalities of gait and mobility: Secondary | ICD-10-CM | POA: Diagnosis not present

## 2017-02-11 DIAGNOSIS — R29898 Other symptoms and signs involving the musculoskeletal system: Secondary | ICD-10-CM

## 2017-02-11 DIAGNOSIS — R41841 Cognitive communication deficit: Secondary | ICD-10-CM | POA: Diagnosis not present

## 2017-02-11 DIAGNOSIS — R296 Repeated falls: Secondary | ICD-10-CM

## 2017-02-11 DIAGNOSIS — M6281 Muscle weakness (generalized): Secondary | ICD-10-CM | POA: Diagnosis not present
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
## 2017-02-12 ENCOUNTER — Encounter (INDEPENDENT_AMBULATORY_CARE_PROVIDER_SITE_OTHER): Payer: Medicare Other | Admitting: Ophthalmology

## 2017-02-12 DIAGNOSIS — H353231 Exudative age-related macular degeneration, bilateral, with active choroidal neovascularization: Secondary | ICD-10-CM

## 2017-02-12 DIAGNOSIS — H35033 Hypertensive retinopathy, bilateral: Secondary | ICD-10-CM

## 2017-02-12 DIAGNOSIS — H43813 Vitreous degeneration, bilateral: Secondary | ICD-10-CM

## 2017-02-12 DIAGNOSIS — I10 Essential (primary) hypertension: Secondary | ICD-10-CM | POA: Diagnosis not present

## 2017-02-13 ENCOUNTER — Ambulatory Visit: Payer: Medicare Other | Admitting: Physical Therapy

## 2017-02-13 ENCOUNTER — Encounter: Payer: Self-pay | Admitting: Physical Therapy

## 2017-02-13 DIAGNOSIS — R262 Difficulty in walking, not elsewhere classified: Secondary | ICD-10-CM | POA: Diagnosis not present

## 2017-02-13 DIAGNOSIS — R2689 Other abnormalities of gait and mobility: Secondary | ICD-10-CM

## 2017-02-13 DIAGNOSIS — R41841 Cognitive communication deficit: Secondary | ICD-10-CM | POA: Diagnosis not present

## 2017-02-13 DIAGNOSIS — R29898 Other symptoms and signs involving the musculoskeletal system: Secondary | ICD-10-CM

## 2017-02-13 DIAGNOSIS — R296 Repeated falls: Secondary | ICD-10-CM | POA: Diagnosis not present

## 2017-02-13 DIAGNOSIS — M6281 Muscle weakness (generalized): Secondary | ICD-10-CM | POA: Diagnosis not present
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
## 2017-02-16 ENCOUNTER — Other Ambulatory Visit: Payer: Self-pay | Admitting: Cardiology

## 2017-02-18 ENCOUNTER — Ambulatory Visit: Payer: Medicare Other | Admitting: Physical Therapy

## 2017-02-18 NOTE — Telephone Encounter (Signed)
REFILL 

## 2017-02-20 ENCOUNTER — Ambulatory Visit: Payer: Medicare Other | Attending: Family Medicine | Admitting: Physical Therapy

## 2017-02-20 ENCOUNTER — Encounter: Payer: Self-pay | Admitting: Physical Therapy

## 2017-02-20 DIAGNOSIS — R2689 Other abnormalities of gait and mobility: Secondary | ICD-10-CM | POA: Insufficient documentation

## 2017-02-20 DIAGNOSIS — M6281 Muscle weakness (generalized): Secondary | ICD-10-CM | POA: Diagnosis not present

## 2017-02-20 DIAGNOSIS — R262 Difficulty in walking, not elsewhere classified: Secondary | ICD-10-CM | POA: Diagnosis not present

## 2017-02-20 DIAGNOSIS — R296 Repeated falls: Secondary | ICD-10-CM | POA: Diagnosis not present

## 2017-02-20 DIAGNOSIS — R29898 Other symptoms and signs involving the musculoskeletal system: Secondary | ICD-10-CM | POA: Insufficient documentation
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
## 2017-02-21 ENCOUNTER — Other Ambulatory Visit: Payer: Self-pay | Admitting: Cardiology

## 2017-02-25 ENCOUNTER — Ambulatory Visit: Payer: Medicare Other | Admitting: Physical Therapy

## 2017-02-27 ENCOUNTER — Ambulatory Visit: Payer: Medicare Other | Admitting: Physical Therapy

## 2017-02-27 ENCOUNTER — Encounter: Payer: Self-pay | Admitting: Physical Therapy

## 2017-02-27 DIAGNOSIS — R29898 Other symptoms and signs involving the musculoskeletal system: Secondary | ICD-10-CM

## 2017-02-27 DIAGNOSIS — R2689 Other abnormalities of gait and mobility: Secondary | ICD-10-CM

## 2017-02-27 DIAGNOSIS — M6281 Muscle weakness (generalized): Secondary | ICD-10-CM

## 2017-02-27 DIAGNOSIS — R296 Repeated falls: Secondary | ICD-10-CM

## 2017-02-27 DIAGNOSIS — R262 Difficulty in walking, not elsewhere classified: Secondary | ICD-10-CM | POA: Diagnosis not present
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
## 2017-03-05 ENCOUNTER — Ambulatory Visit: Payer: Medicare Other | Admitting: Physical Therapy

## 2017-03-05 ENCOUNTER — Encounter: Payer: Self-pay | Admitting: Physical Therapy

## 2017-03-05 DIAGNOSIS — R262 Difficulty in walking, not elsewhere classified: Secondary | ICD-10-CM

## 2017-03-05 DIAGNOSIS — R296 Repeated falls: Secondary | ICD-10-CM | POA: Diagnosis not present

## 2017-03-05 DIAGNOSIS — R29898 Other symptoms and signs involving the musculoskeletal system: Secondary | ICD-10-CM | POA: Diagnosis not present

## 2017-03-05 DIAGNOSIS — R2689 Other abnormalities of gait and mobility: Secondary | ICD-10-CM | POA: Diagnosis not present

## 2017-03-05 DIAGNOSIS — M6281 Muscle weakness (generalized): Secondary | ICD-10-CM | POA: Diagnosis not present
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
## 2017-03-06 ENCOUNTER — Other Ambulatory Visit: Payer: Self-pay

## 2017-03-06 MED ORDER — METOPROLOL SUCCINATE ER 25 MG PO TB24: 75.0000 mg | ORAL_TABLET | Freq: Every day | ORAL | 3 refills | Status: DC

## 2017-03-12 ENCOUNTER — Encounter: Payer: Self-pay | Admitting: Physical Therapy

## 2017-03-12 ENCOUNTER — Ambulatory Visit: Payer: Medicare Other | Admitting: Physical Therapy

## 2017-03-12 DIAGNOSIS — R29898 Other symptoms and signs involving the musculoskeletal system: Secondary | ICD-10-CM | POA: Diagnosis not present

## 2017-03-12 DIAGNOSIS — R2689 Other abnormalities of gait and mobility: Secondary | ICD-10-CM | POA: Diagnosis not present

## 2017-03-12 DIAGNOSIS — M6281 Muscle weakness (generalized): Secondary | ICD-10-CM | POA: Diagnosis not present

## 2017-03-12 DIAGNOSIS — R262 Difficulty in walking, not elsewhere classified: Secondary | ICD-10-CM | POA: Diagnosis not present

## 2017-03-12 DIAGNOSIS — R296 Repeated falls: Secondary | ICD-10-CM | POA: Diagnosis not present
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
## 2017-03-18 ENCOUNTER — Other Ambulatory Visit: Payer: Self-pay | Admitting: *Deleted

## 2017-03-18 MED ORDER — METOPROLOL SUCCINATE ER 25 MG PO TB24: 75.0000 mg | ORAL_TABLET | Freq: Every day | ORAL | 3 refills | Status: DC

## 2017-03-22 ENCOUNTER — Other Ambulatory Visit: Payer: Self-pay | Admitting: Cardiology

## 2017-03-25 ENCOUNTER — Ambulatory Visit (INDEPENDENT_AMBULATORY_CARE_PROVIDER_SITE_OTHER): Payer: Medicare Other | Admitting: Neurology

## 2017-03-25 ENCOUNTER — Encounter: Payer: Self-pay | Admitting: Neurology

## 2017-03-25 VITALS — BP 134/57 | HR 55 | Wt 190.0 lb

## 2017-03-25 DIAGNOSIS — W19XXXA Unspecified fall, initial encounter: Secondary | ICD-10-CM | POA: Diagnosis not present

## 2017-03-25 DIAGNOSIS — R413 Other amnesia: Secondary | ICD-10-CM

## 2017-03-25 DIAGNOSIS — I6349 Cerebral infarction due to embolism of other cerebral artery: Secondary | ICD-10-CM | POA: Diagnosis not present

## 2017-03-25 DIAGNOSIS — R4189 Other symptoms and signs involving cognitive functions and awareness: Secondary | ICD-10-CM | POA: Diagnosis not present

## 2017-03-25 DIAGNOSIS — R2689 Other abnormalities of gait and mobility: Secondary | ICD-10-CM

## 2017-03-25 DIAGNOSIS — Z9861 Coronary angioplasty status: Secondary | ICD-10-CM | POA: Diagnosis not present

## 2017-03-25 DIAGNOSIS — I2510 Atherosclerotic heart disease of native coronary artery without angina pectoris: Secondary | ICD-10-CM | POA: Diagnosis not present

## 2017-03-25 NOTE — Progress Notes (Signed)
[facility]    Provider:  Dr Jaynee Eagles Referring Provider: Lawerance Cruel, MD Primary Care Physician:  Lawerance Cruel, MD  CC:  CVA and memory loss  HPI:  Dominic [name] is a 83 y.o. male here as a referral from Dr. Harrington Challenger for CVA. PMHx OSA, RBBB, HTN, HLD, CAD, afib with rvr. Patient is here for memory problems since CVA. Strokes were in 2016. Wife is here provides most information. Cannot hold onto concepts well. Also with balance problems. Has been through cognitive behavioral therapy with speech therapy. Her a review of records, patient underwent cardiac catheterization in late September and percutaneous intervention, had significant sensation of dizziness and feeling off balance. He felt unsteady with his gait. Patient had bilateral punctate acute infarcts right greater than left likely embolic related to Procedure. Most likely due to A. fib hold off and all questions due to temporal relationship.Here with his wife. After the procedure he was not the same. He has been struggling since the stroke, he struggling with balance (but he does a PMHx of neuropathy in the feet). He has had physical and speech therapy. But he has been having short-term memory problems for several years beforehand. He saw a nutritionist and they recommend some medication that helped. He fell at work, he tripped over something on the group and now he is on administrative leave. The memory improved back to baseline after the stroke. But memory has worsened. Things did improve with speech therapy recently. Mother had dementia.  Wife pays the bills but husband says he wouldhave difficulty paying the bills. Even simple bill keeping would be difficult. He finds it dificult to read. He has decreased concentration. He has some minimal difficulty with the day and date but not the year but it is because he doesn't work or notice the day or date. He is having difficulty driving, gets into some "scrapes". Wife drives  mostly. No hallucinations or delusions. They don't get out much so unclear if he has difficulty remembering people. But he walks into the kitchen and firgets what he is doing. No mood difficulties, no depression or anxiety.No other focal neurologic deficits, associated symptoms, inciting events or modifiable factors.  Reviewed notes, labs and imaging from outside physicians, which showed:  MRI brain  03/13/2017: Personally reviewed MRI of the brain images (also reviewed with patient and his wife) and agree with the following: Multiple small areas of infarct involving the right frontal lobe,right parietal lobe, and left parietal lobe. These are consistent with acute embolic infarction.  Review of Systems: Patient complains of symptoms per HPI as well as the following symptoms: memory loss, imbalance. Pertinent negatives and positives per HPI. All others negative.   Social History   Social History  . Marital status: Married    Spouse name: N/A  . Number of children: N/A  . Years of education: N/A   Occupational History  . Not on file.   Social History Main Topics  . Smoking status: Never Smoker  . Smokeless tobacco: Never Used  . Alcohol use No  . Drug use: No  . Sexual activity: Not on file   Other Topics Concern  . Not on file   Social History Narrative  . No narrative on file    Family History  Problem Relation Age of Onset  . Emphysema Mother   . Cancer Father     Past Medical History:  Diagnosis Date  . Amputated finger    a. L index d/t  to dog bite.  . Atrial fibrillation with RVR ([place])    a. New onset diagnosed 11/20/2013, spont converted to NSR.  Marland Kitchen Concussion   . Coronary artery disease 09/2004, 04/2005, 02/2015   a. Stent to prox and mid RCA 08/2001. b. DES to RCA for ISR 08/2004. c. DES to [facility] for ISR 05/2005. d. Low risk nuc 10/2013 (done for CP in setting of new AF). e. PCI to the mid-RCA for in-stent restenosis with cutting balloon angioplasty f. PCI to an OM2  lesion  . Dyslipidemia    a. Intol of statins.  . Fracture acetabulum-closed ([place]) 2015  . Fractured pelvis ([place]) 2015  . Hypertension   . Lung nodule    , right upper lobe  . Macular degeneration    both eyes, receives shots in his eyes  . Obstructive sleep apnea   . OSA (obstructive sleep apnea)   . Prostate cancer ([place])   . RBBB (right bundle branch block)     Past Surgical History:  Procedure Laterality Date  . ANTERIOR CRUCIATE LIGAMENT REPAIR Right   . CARDIAC CATHETERIZATION N/A 03/10/2015   Procedure: Left Heart Cath and Coronary Angiography;  Surgeon: Lorretta Harp, MD;  Location: [facility];  Service: Cardiovascular;  Laterality: N/A;  . CARDIAC CATHETERIZATION  03/13/2015   Procedure: Coronary Stent Intervention;  Surgeon: Peter M Martinique, MD;  Location: [facility];  Service: Cardiovascular;;  . CARDIAC CATHETERIZATION  03/13/2015   Procedure: Intravascular Pressure Wire/FFR Study;  Surgeon: Peter M Martinique, MD;  Location: [facility];  Service: Cardiovascular;;  . CORONARY STENT PLACEMENT    . L knee ligament replacement    . PROSTATECTOMY    . TONSILLECTOMY    . TOOTH EXTRACTION      Current Outpatient Prescriptions  Medication Sig Dispense Refill  . amLODipine (NORVASC) 5 MG tablet TAKE 1 TABLET BY MOUTH EVERY DAY 30 tablet 5  . clopidogrel (PLAVIX) 75 MG tablet TAKE 1 TABLET BY MOUTH DAILY 90 tablet 3  . ELIQUIS 5 MG TABS tablet TAKE 1 TABLET BY MOUTH TWICE A DAY 180 tablet 0  . metoprolol succinate (TOPROL-XL) 25 MG 24 hr tablet Take 3 tablets (75 mg total) by mouth daily. 270 tablet 3  . Multiple Vitamins-Minerals (PRESERVISION AREDS) CAPS Take 1 capsule by mouth 2 (two) times daily.    . nitroGLYCERIN (NITROSTAT) 0.4 MG SL tablet PLACE ONE TABLET UNDER THE TONGUE EVERY 5 MINUTES AS NEEDED FOR CHEST PAIN 25 tablet 3  . NON FORMULARY as directed. tocotrienols are vitamin E supplement, Lutein-zeaxanthis, and piracetam    . Piracetam POWD  Take 2 scoop by mouth daily.     . Psyllium (METAMUCIL PO) Take 15 mLs by mouth daily. Mix in water and drink    . Ubiquinol 100 MG CAPS Take 100 mg by mouth 2 (two) times daily.     Marland Kitchen VIGAMOX 0.5 % ophthalmic solution INSTILL ONE DROP INTO LEFT EYE 4 TIMES A DAY FOR 2 DAYS AFTER EACH MONTHLY EYE INJECTION  12   No current facility-administered medications for this visit.     Allergies as of 03/25/2017 - Review Complete 03/25/2017  Allergen Reaction Noted  . Statins Other (See Comments) 04/28/2013  . Zetia [ezetimibe] Other (See Comments) 04/28/2013    Vitals: BP (!) 134/57   Pulse (!) 55   Wt 190 lb (86.2 kg)   BMI 26.50 kg/m  Last Weight:  Wt Readings from Last 1 Encounters:  03/25/17 190 lb (86.2 kg)   Last Height:   Ht Readings from Last 1 Encounters:  02/03/17 5\' 11"  (1.803 m)    Physical exam: Exam: Gen: NAD, conversant, Orofacial dyskinesias              CV: RRR, no MRG. No Carotid Bruits. No peripheral edema, warm, nontender Eyes: Conjunctivae clear without exudates or hemorrhage  Neuro: Detailed Neurologic Exam  Speech:    Speech is dysarthric without aphasia with normal comprehension.  Cognition:     MMSE - Mini Mental State Exam 03/25/2017  Orientation to time 5  Orientation to Place 5  Registration 3  Attention/ Calculation 5  Recall 1  Language- name 2 objects 2  Language- repeat 1  Language- follow 3 step command 3  Language- read & follow direction 1  Write a sentence 1  Copy design 1  Total score 28   Cranial Nerves:    The pupils are equal, round, and reactive to light. Attempted funduscopic exam could not visualize. Visual fields are full to finger confrontation. Extraocular movements are intact. Trigeminal sensation is intact and the muscles of mastication are normal. The face is symmetric. The palate elevates in the midline. Hearing impaired. Voice is normal. Shoulder shrug is normal. The tongue has normal motion without fasciculations.    Coordination:    No dysmetria noted  Gait:    Wide based, imbalance, cannot heel or toe or tandem   Motor Observation:    Orofacial dyskinesias Tone:    Normal muscle tone.    Posture:    Posture is normal. normal erect    Strength:    Strength is V/V in the upper and lower limbs.      Sensation: intact to LT     Reflex Exam:  DTR's:    Deep tendon reflexes in the upper are brisk and in the lower extremities are hyporeflexic bilaterally.   Toes:    The toes are downgoing bilaterally.   Clonus:    Clonus is absent.  Assessment/Plan:  This is an 83 year old male with a family history of Alzheimer's here for progressive memory loss. Patient had an embolic shower of strokes in 2016 however patient feels as though he returned back to his baseline at that time following strokes but memory has since declined. He would like to know if this is onset of dementia. MMSE was 28 out of 30 but given clinical history this may be mild cognitive impairment or mild/early Alzheimer's dementia. Recommend complete evaluation.  MRi brain to evaluate for other causes of dementia as well as reversible causes Labs  Formal neurocognitive testing Return to clinic after formal neurocognitive testing and follow-up to discuss treatment and possible clinical trials  Orders Placed This Encounter  Procedures  . MR BRAIN WO CONTRAST  . B12 and Folate Panel  . Methylmalonic acid, serum  . RPR  . TSH  . Ambulatory referral to Neuropsychology   Cc: Dr. Lovena Le, [facility] [address] [place], [place] [address]  Phone [phone] Fax [phone]

## 2017-03-25 NOTE — Patient Instructions (Addendum)
MRI brain Formal neurocognitive testing with Dr. Laureen Ochs today

## 2017-03-26 ENCOUNTER — Telehealth: Payer: Self-pay | Admitting: Neurology

## 2017-03-26 NOTE — Telephone Encounter (Signed)
Patient is severely B12 deficient. This can be an extremely serious condition: Its so low it doesn't give me a number just <150.   B12 deficiency can cause weakness, fatigue, easy bruising or bleeding,sore tongue, stomach upset, weight loss, and diarrhea or constipation, tingling, burning or numbness to the fingers and toes and neuropathy in the feet and hands, difficulty walking, mood changes, depression, memory loss, disorientation and, in severe cases, dementia.   I recommend he starts getting B12 shots for the next 6 months and then we can transition him to oral B12. I also wonder if this is the cause of the neuropathy in his feet as B12 deficiency can cause severe neuropathy and it is very uncommon for neuropathy to be caused by a statin which they were told was the cause in the past.  Ask him to come in to the office as soon as he can and start 1086mcg b12 shots weekly for 4 weeks and then once a month for 6 months. If his pcp office is closer than [place] we can ask if they can do it but he needs B12 injections for 6 months and then oral B12 daily.   Let me know what they say. Also forward this result to his pcp thanks.

## 2017-03-28 ENCOUNTER — Other Ambulatory Visit: Payer: Self-pay | Admitting: *Deleted

## 2017-03-28 LAB — TSH: TSH: 1.57 u[IU]/mL (ref 0.450–4.500)

## 2017-03-28 LAB — METHYLMALONIC ACID, SERUM: METHYLMALONIC ACID: 834 nmol/L — AB (ref 0–378)

## 2017-03-28 LAB — B12 AND FOLATE PANEL
Folate: 5 ng/mL (ref >3.0)
Vitamin B-12: <150 pg/mL — ABNORMAL LOW (ref 232–1245)

## 2017-03-28 LAB — RPR: RPR: NONREACTIVE

## 2017-03-28 MED ORDER — METOPROLOL SUCCINATE ER 25 MG PO TB24: 75.0000 mg | ORAL_TABLET | Freq: Every day | ORAL | 1 refills | Status: DC

## 2017-03-30 ENCOUNTER — Telehealth: Payer: Self-pay | Admitting: Neurology

## 2017-03-30 NOTE — Telephone Encounter (Signed)
Patient is severely B12 deficient. This can be an extremely serious condition: Its so low it doesn't give me a number just <150.   B12 deficiency can cause weakness, fatigue, easy bruising or bleeding,sore tongue, stomach upset, weight loss, and diarrhea or constipation, tingling, burning or numbness to the fingers and toes and neuropathy in the feet and hands, difficulty walking, mood changes, depression, memory loss, disorientation and, in severe cases, dementia.   I recommend he starts getting B12 shots for the next 6 months and then we can transition him to oral B12. I also wonder if this is the cause of the neuropathy in his feet as B12 deficiency can cause severe neuropathy and it is very uncommon for neuropathy to be caused by a statin which they were told was the cause in the past.  Ask him to come in to the office as soon as he can and start 1056mcg b12 shots weekly for 4 weeks and then once a month for 6 months. If his pcp office is closer than [place] we can ask if they can do it but he needs B12 injections for 6 months and then oral B12 daily.   Let me know what they say. Also forward this result to his pcp thanks.

## 2017-03-31 ENCOUNTER — Other Ambulatory Visit: Payer: Self-pay | Admitting: *Deleted

## 2017-03-31 ENCOUNTER — Ambulatory Visit (INDEPENDENT_AMBULATORY_CARE_PROVIDER_SITE_OTHER): Payer: Medicare Other | Admitting: *Deleted

## 2017-03-31 ENCOUNTER — Encounter: Payer: Self-pay | Admitting: Psychology

## 2017-03-31 ENCOUNTER — Ambulatory Visit: Payer: Medicare Other | Admitting: Neurology

## 2017-03-31 DIAGNOSIS — E538 Deficiency of other specified B group vitamins: Secondary | ICD-10-CM

## 2017-03-31 MED ORDER — CYANOCOBALAMIN 1000 MCG/ML IJ SOLN
1000.0000 ug | INTRAMUSCULAR | Status: AC
  Administered 2017-03-31 – 2017-04-21 (×4): 1000 ug via INTRAMUSCULAR

## 2017-03-31 MED ORDER — CYANOCOBALAMIN 1000 MCG/ML IJ SOLN
1000.0000 ug | INTRAMUSCULAR | Status: DC
  Administered 2017-05-19 – 2017-07-08 (×2): 1000 ug via INTRAMUSCULAR

## 2017-03-31 NOTE — Progress Notes (Signed)
Pt here for B 12 injection.  Under aseptic technique cyanocobalamin 1000mcg/1ml IM given R deltoid.  Tolerated well.  Bandaid applied.  

## 2017-03-31 NOTE — Telephone Encounter (Signed)
I called and spoke with Izora Gala his wife (on [place]). She verbalized understanding of B12 result comments/recommendations from Dr. Jaynee Eagles and states that Atha will come into office to have a B12 injection weekly x 4 weeks, then monthly x6 months followed by oral B12 (per MD). They will start today. I have also sent the labs to his PCP for review.

## 2017-04-04 ENCOUNTER — Ambulatory Visit
Admission: RE | Admit: 2017-04-04 | Discharge: 2017-04-04 | Disposition: A | Discharge status: Home / Self Care | Payer: Medicare Other | Source: Ambulatory Visit | Attending: Neurology | Admitting: Neurology

## 2017-04-04 DIAGNOSIS — W19XXXA Unspecified fall, initial encounter: Secondary | ICD-10-CM

## 2017-04-04 DIAGNOSIS — R413 Other amnesia: Secondary | ICD-10-CM | POA: Diagnosis not present

## 2017-04-04 DIAGNOSIS — I6349 Cerebral infarction due to embolism of other cerebral artery: Secondary | ICD-10-CM

## 2017-04-04 DIAGNOSIS — R2689 Other abnormalities of gait and mobility: Secondary | ICD-10-CM | POA: Diagnosis not present

## 2017-04-07 ENCOUNTER — Ambulatory Visit (INDEPENDENT_AMBULATORY_CARE_PROVIDER_SITE_OTHER): Payer: Medicare Other | Admitting: *Deleted

## 2017-04-07 ENCOUNTER — Ambulatory Visit: Payer: Medicare Other

## 2017-04-07 ENCOUNTER — Telehealth: Payer: Self-pay | Admitting: *Deleted

## 2017-04-07 DIAGNOSIS — E538 Deficiency of other specified B group vitamins: Secondary | ICD-10-CM | POA: Diagnosis not present

## 2017-04-07 NOTE — Telephone Encounter (Signed)
Called and spoke with pt's wife, Izora Gala (on [place]). Confirmed Moksh is coming in today for B12 injection #2.

## 2017-04-07 NOTE — Progress Notes (Signed)
B12 1000 mcg / 1 mL IM injection given in R deltoid. Bandaid applied, tolerated well. He is aware to come back next Monday for injection #3.

## 2017-04-08 ENCOUNTER — Telehealth: Payer: Self-pay | Admitting: *Deleted

## 2017-04-08 NOTE — Telephone Encounter (Signed)
Called and spoke with wife Izora Gala (on [place]) regarding MRI result comments from Dr. Jaynee Eagles. She verbalized understanding that it is similar to 2016 scan, shows no progression and that there is atrophy which is seen with aging. She had no further questions.

## 2017-04-08 NOTE — Telephone Encounter (Signed)
-----   Message from Melvenia Beam, MD sent at 04/08/2017  7:48 AM EDT ----- Patient has atrophy but that is similar to appearance from [facility] in 2016 and shows no progression. We see atrophy in aging. thanks

## 2017-04-14 ENCOUNTER — Ambulatory Visit: Payer: Medicare Other

## 2017-04-15 ENCOUNTER — Telehealth: Payer: Self-pay | Admitting: *Deleted

## 2017-04-15 ENCOUNTER — Ambulatory Visit (INDEPENDENT_AMBULATORY_CARE_PROVIDER_SITE_OTHER): Payer: Medicare Other | Admitting: *Deleted

## 2017-04-15 DIAGNOSIS — E538 Deficiency of other specified B group vitamins: Secondary | ICD-10-CM

## 2017-04-15 NOTE — Progress Notes (Signed)
Cyanocobalamin 1000 mcg / 1 mL injection administered in L deltoid. Tolerated well. Bandaid applied.  //BCrn

## 2017-04-15 NOTE — Telephone Encounter (Signed)
Called and spoke with pt's wife (on [place]). She stated that they forgot about Dominic [name]'s B12 injection yesterday and that he will be coming in this afternoon at either 2:15 or 3:15. She had no further questions.

## 2017-04-21 ENCOUNTER — Ambulatory Visit (INDEPENDENT_AMBULATORY_CARE_PROVIDER_SITE_OTHER): Payer: Medicare Other | Admitting: *Deleted

## 2017-04-21 DIAGNOSIS — E538 Deficiency of other specified B group vitamins: Secondary | ICD-10-CM | POA: Diagnosis not present

## 2017-04-21 NOTE — Progress Notes (Addendum)
Cyanocobalamin 1000 mcg (B12) injection given in R deltoid. Bandaid applied. Tolerated well. See MAR for med details. Confirmed next dose of B12 will be his first monthly injection on 05/19/17.

## 2017-04-30 ENCOUNTER — Encounter (INDEPENDENT_AMBULATORY_CARE_PROVIDER_SITE_OTHER): Payer: Medicare Other | Admitting: Ophthalmology

## 2017-04-30 DIAGNOSIS — I10 Essential (primary) hypertension: Secondary | ICD-10-CM | POA: Diagnosis not present

## 2017-04-30 DIAGNOSIS — H2513 Age-related nuclear cataract, bilateral: Secondary | ICD-10-CM

## 2017-04-30 DIAGNOSIS — H35033 Hypertensive retinopathy, bilateral: Secondary | ICD-10-CM

## 2017-04-30 DIAGNOSIS — H43813 Vitreous degeneration, bilateral: Secondary | ICD-10-CM

## 2017-04-30 DIAGNOSIS — H353231 Exudative age-related macular degeneration, bilateral, with active choroidal neovascularization: Secondary | ICD-10-CM | POA: Diagnosis not present

## 2017-05-05 DIAGNOSIS — G5762 Lesion of plantar nerve, left lower limb: Secondary | ICD-10-CM | POA: Diagnosis not present

## 2017-05-05 DIAGNOSIS — M7751 Other enthesopathy of right foot: Secondary | ICD-10-CM | POA: Diagnosis not present

## 2017-05-05 DIAGNOSIS — M7752 Other enthesopathy of left foot: Secondary | ICD-10-CM | POA: Diagnosis not present

## 2017-05-05 DIAGNOSIS — M19072 Primary osteoarthritis, left ankle and foot: Secondary | ICD-10-CM | POA: Diagnosis not present

## 2017-05-05 DIAGNOSIS — M19071 Primary osteoarthritis, right ankle and foot: Secondary | ICD-10-CM | POA: Diagnosis not present

## 2017-05-05 DIAGNOSIS — G5761 Lesion of plantar nerve, right lower limb: Secondary | ICD-10-CM | POA: Diagnosis not present

## 2017-05-12 DIAGNOSIS — M7752 Other enthesopathy of left foot: Secondary | ICD-10-CM | POA: Diagnosis not present

## 2017-05-12 DIAGNOSIS — M7751 Other enthesopathy of right foot: Secondary | ICD-10-CM | POA: Diagnosis not present

## 2017-05-12 DIAGNOSIS — G5761 Lesion of plantar nerve, right lower limb: Secondary | ICD-10-CM | POA: Diagnosis not present

## 2017-05-13 DIAGNOSIS — Z23 Encounter for immunization: Secondary | ICD-10-CM | POA: Diagnosis not present

## 2017-05-13 DIAGNOSIS — C61 Malignant neoplasm of prostate: Secondary | ICD-10-CM | POA: Diagnosis not present

## 2017-05-13 DIAGNOSIS — Z0000 Encounter for general adult medical examination without abnormal findings: Secondary | ICD-10-CM | POA: Diagnosis not present

## 2017-05-13 DIAGNOSIS — E7800 Pure hypercholesterolemia, unspecified: Secondary | ICD-10-CM | POA: Diagnosis not present

## 2017-05-13 DIAGNOSIS — I10 Essential (primary) hypertension: Secondary | ICD-10-CM | POA: Diagnosis not present

## 2017-05-19 ENCOUNTER — Ambulatory Visit (INDEPENDENT_AMBULATORY_CARE_PROVIDER_SITE_OTHER): Payer: Medicare Other | Admitting: *Deleted

## 2017-05-19 DIAGNOSIS — E538 Deficiency of other specified B group vitamins: Secondary | ICD-10-CM | POA: Diagnosis not present

## 2017-05-19 NOTE — Progress Notes (Signed)
B12 injection given in R deltoid IM. Tolerated well. Aseptic technique. Bandaid applied.

## 2017-05-28 DIAGNOSIS — C61 Malignant neoplasm of prostate: Secondary | ICD-10-CM | POA: Diagnosis not present

## 2017-05-29 DIAGNOSIS — H401212 Low-tension glaucoma, right eye, moderate stage: Secondary | ICD-10-CM | POA: Diagnosis not present

## 2017-05-29 DIAGNOSIS — H401223 Low-tension glaucoma, left eye, severe stage: Secondary | ICD-10-CM | POA: Diagnosis not present

## 2017-06-03 DIAGNOSIS — C61 Malignant neoplasm of prostate: Secondary | ICD-10-CM | POA: Diagnosis not present

## 2017-06-03 DIAGNOSIS — R338 Other retention of urine: Secondary | ICD-10-CM | POA: Diagnosis not present

## 2017-06-05 ENCOUNTER — Encounter: Payer: Self-pay | Admitting: Psychology

## 2017-06-05 ENCOUNTER — Ambulatory Visit (INDEPENDENT_AMBULATORY_CARE_PROVIDER_SITE_OTHER): Payer: Medicare Other | Admitting: Psychology

## 2017-06-05 DIAGNOSIS — I6349 Cerebral infarction due to embolism of other cerebral artery: Secondary | ICD-10-CM

## 2017-06-05 DIAGNOSIS — R413 Other amnesia: Secondary | ICD-10-CM

## 2017-06-05 DIAGNOSIS — E538 Deficiency of other specified B group vitamins: Secondary | ICD-10-CM

## 2017-06-05 NOTE — Progress Notes (Signed)
   Neuropsychology Note  Dominic [name] came in today for 1 hour of neuropsychological testing with technician, Dominic [name], BS, under the supervision of Dr. Macarthur Critchley. The patient did not appear overtly distressed by the testing session, per behavioral observation or via self-report to the technician. Rest breaks were offered. Dominic [name] will return within 2 weeks for a feedback session with Dr. Si Raider at which time his test performances, clinical impressions and treatment recommendations will be reviewed in detail. The patient understands he can contact our office should he require our assistance before this time.  Full report to follow.

## 2017-06-05 NOTE — Progress Notes (Signed)
NEUROPSYCHOLOGICAL INTERVIEW (CPT: [id number])  Name: Dominic [name] Date of Birth: 01/15/1934 Date of Interview: 06/05/2017  Reason for Referral:  Dominic [name] is a 83 y.o. male who is referred for neuropsychological evaluation by Dr. Sarina Ill of [facility] due to concerns about memory loss with history of CVA. This patient is accompanied in the office by his wife, Izora Gala, who supplements the history.  History of Presenting Problem:  Dominic [name] was seen by Dr. Jaynee Eagles for neurologic evaluation of memory loss on 03/25/2017. MMSE was 28/30. The patient has a history of embolic shower of strokes in 02/2015 following cardiac catheterization and angioplasty procedure. MRI on 03/14/2015 revealed multiple small areas of infarct involving the right frontal lobe, right parietal lobe, and left parietal lobe, consistent with acute embolic infarction. Generalized atrophy, with ventricular enlargement consistent with atrophy, was also reported. He had significant weakness and balance problems after the procedure and strokes, and did do physical therapy at that time. More recently, his PCP referred him for cognitive/speech therapy as well. He completed speech therapy in 01/2017. He continues to do [facility] (cognitive rehabilitation program) on his iPad. Of note, following Dominic [name] visit with Dr. Jaynee Eagles in 03/2017, labwork revealed severe vitamin B12 deficiency. He started injections at that time and continues to get these.  Dominic [name] reports that he was having some age-related memory loss prior to the strokes but he was taking a supplement for that which he thought was helpful. After the strokes, his wife reported he was more forgetful, distractible and had had slurred speech. They reported to me that his memory/cognitive issues have not improved since the stroke and in fact have worsened over the past 2 years. He has a family history of Alzheimer's dementia in his mother.  Current  cognitive symptoms reportedly include forgetting recent conversations/things he has been told, repeating questions/statements, distractibility, difficulty carrying out a task to completion because he will forget what he is doing or how to do it, word finding difficulty, and trouble with navigation when driving. He has gotten lost driving to familiar locations. He has more uncertainty about directions. His wife does most of the driving, as she has for several years. She has to remind him to take his medication. She has always managed the finances and appointments.   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 He denied any difficulty cognitively in performing his job responsibilities after he returned to work, but after having a fall while at work, his employer put him on long term disability. He is on disability through May 2019 and then will not return to work. This has been difficult for him to accept as he wanted to continue working full time.   He continues to have difficulty with balance and has had falls. He denies hitting his head in any recent falls. He feels weaker and more unsteady on his feet. He was very active before the strokes, so this has been difficult to adjust to as well. He tries to do his PT exercises at home.   He is sleeping well at night. He is sleeping more than he used to before the strokes. He also dozes off during the day without realizing it. He noted that he was diagnosed with sleep apnea in the past but didn't find the CPAP helpful so discontinued using it.  Psychiatric history was denied. He denied depressed mood but  admitted he gets agitated more easily now. He has not had any hallucinations or delusions. His appetite is good. He denied suicidal ideation or intention.   Social History: Born/Raised: [place], [place].  Lived in [place] for 30 years, has lived in [place] for 23 years.    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 Is currently on disability, will be retiring in May 2019. Marital history: Married to current wife for 26 years. They each had 2 young children from previous relationships when they got married. They also raised two grandchildren. Alcohol: None Tobacco: None   Medical History: Past Medical History:  Diagnosis Date  . Amputated finger    a. L index d/t to dog bite.  . Atrial fibrillation with RVR ([place])    a. New onset diagnosed 11/20/2013, spont converted to NSR.  Marland Kitchen Concussion   . Coronary artery disease 09/2004, 04/2005, 02/2015   a. Stent to prox and mid RCA 08/2001. b. DES to RCA for ISR 08/2004. c. DES to [facility] for ISR 05/2005. d. Low risk nuc 10/2013 (done for CP in setting of new AF). e. PCI to the mid-RCA for in-stent restenosis with cutting balloon angioplasty f. PCI to an OM2 lesion  . Dyslipidemia    a. Intol of statins.  . Fracture acetabulum-closed ([place]) 2015  . Fractured pelvis ([place]) 2015  . Hypertension   . Lung nodule    , right upper lobe  . Macular degeneration    both eyes, receives shots in his eyes  . Obstructive sleep apnea   . OSA (obstructive sleep apnea)   . Prostate cancer ([place])   . RBBB (right bundle branch block)       Current Medications:  Outpatient Encounter Medications as of 06/05/2017  Medication Sig  . amLODipine (NORVASC) 5 MG tablet TAKE 1 TABLET BY MOUTH EVERY DAY  . clopidogrel (PLAVIX) 75 MG tablet TAKE 1 TABLET BY MOUTH DAILY  . ELIQUIS 5 MG TABS tablet TAKE 1 TABLET BY MOUTH TWICE A DAY  . metoprolol succinate (TOPROL-XL) 25 MG 24 hr tablet Take 3 tablets (75 mg total) by mouth daily.  . Multiple Vitamins-Minerals (PRESERVISION AREDS) CAPS Take 1 capsule by mouth 2 (two) times daily.  . nitroGLYCERIN (NITROSTAT) 0.4 MG SL tablet PLACE ONE TABLET UNDER THE TONGUE EVERY 5 MINUTES AS NEEDED FOR CHEST PAIN  . NON FORMULARY  as directed. tocotrienols are vitamin E supplement, Lutein-zeaxanthis, and piracetam  . Piracetam POWD Take 2 scoop by mouth daily.   . Psyllium (METAMUCIL PO) Take 15 mLs by mouth daily. Mix in water and drink  . Ubiquinol 100 MG CAPS Take 100 mg by mouth 2 (two) times daily.   Marland Kitchen VIGAMOX 0.5 % ophthalmic solution INSTILL ONE DROP INTO LEFT EYE 4 TIMES A DAY FOR 2 DAYS AFTER EACH MONTHLY EYE INJECTION   Facility-Administered Encounter Medications as of 06/05/2017  Medication  . cyanocobalamin ((VITAMIN B-12)) injection 1,000 mcg     Behavioral Observations:   Appearance: Casually and appropriately dressed and groomed Gait: Ambulated independently, mildly shuffling gait Speech: Fluent; generally normal rate, rhythm and volume.  Thought process: Linear, goal directed Affect: Full, euthymic Interpersonal: Very pleasant, appropriate   TESTING: There is medical necessity to proceed with neuropsychological assessment as the results will be used to aid in differential diagnosis and clinical decision-making and to inform specific treatment recommendations. Per the patient, his wife and medical records reviewed, there has been a  change in cognitive functioning and a reasonable suspicion of dementia versus vascular cognitive impairment.  Following the clinical interview, the patient completed a full battery of neuropsychological testing with my psychometrician under my supervision.   PLAN: The patient will return to see me for a follow-up session at which time his test performances and my impressions and treatment recommendations will be reviewed in detail.  Full report to follow.

## 2017-07-07 ENCOUNTER — Telehealth: Payer: Self-pay | Admitting: Neurology

## 2017-07-07 NOTE — Telephone Encounter (Signed)
Spoke with patient. He will come to get his 2nd monthly B12 injection on 07/08/17 @ 11:45. His last injection was 05/19/17.

## 2017-07-07 NOTE — Progress Notes (Signed)
NEUROPSYCHOLOGICAL EVALUATION   Name:    Dominic [name]  Date of Birth:   Mar 13, 1934 Date of Interview:  06/05/2017 Date of Testing:  06/05/2017   Date of Feedback:  07/08/2017       Background Information:  Reason for Referral:  Dominic [name] is a 84 y.o. male referred by Dr. Sarina Ill to assess his current level of cognitive functioning and assist in differential diagnosis. The current evaluation consisted of a review of available medical records, an interview with the patient and his wife, and the completion of a neuropsychological testing battery. Informed consent was obtained.  History of Presenting Problem:  Dominic [name] was seen by Dr. Jaynee Eagles for neurologic evaluation of memory loss on 03/25/2017. MMSE was 28/30. The patient has a history of embolic shower of strokes in 02/2015 following cardiac catheterization and angioplasty procedure. MRI on 03/14/2015 revealed multiple small areas of infarct involving the right frontal lobe, right parietal lobe, and left parietal lobe, consistent with acute embolic infarction. Generalized atrophy, with ventricular enlargement consistent with atrophy, was also reported. He had significant weakness and balance problems after the procedure and strokes, and did do physical therapy at that time. More recently, his PCP referred him for cognitive/speech therapy as well. He completed speech therapy in 01/2017. He continues to do [facility] (cognitive rehabilitation program) on his iPad. Of note, following Dominic [name] visit with Dr. Jaynee Eagles in 03/2017, labwork revealed severe vitamin B12 deficiency. He started injections at that time and continues to get these. Brain MRI on 04/04/2017 reportedly revealed moderately severe generalized cortical atrophy, similar in appearance to the 03/14/2015 MRI, minimal chronic microvascular ischemic change, and no acute findings.  Dominic [name] reports that he was having some age-related memory loss prior to the strokes but he was  taking a supplement for that which he thought was helpful. After the strokes, his wife reported he was more forgetful, distractible and had had slurred speech. They reported to me that his memory/cognitive issues have not improved since the stroke and in fact have worsened over the past 2 years. He has a family history of Alzheimer's dementia in his mother.  Current cognitive symptoms reportedly include forgetting recent conversations/things he has been told, repeating questions/statements, distractibility, difficulty carrying out a task to completion because he will forget what he is doing or how to do it, word finding difficulty, and trouble with navigation when driving. He has gotten lost driving to familiar locations. He has more uncertainty about directions. His wife does most of the driving, as she has for several years. She has to remind him to take his medication. She has always managed the finances and appointments.   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 He denied any difficulty cognitively in performing his job responsibilities after he returned to work, but after having a fall while at work, his employer put him on long term disability. He is on disability through May 2019 and then will not return to work. This has been difficult for him to accept as he wanted to continue working full time.   He continues to have difficulty with balance and has had falls. He denies hitting his head in any recent falls. He feels weaker and more unsteady on his feet. He was very active before the strokes, so this has been difficult to adjust to as well. He tries to do his PT  exercises at home.   He is sleeping well at night. He is sleeping more than he used to before the strokes. He also dozes off during the day without realizing it. He noted that he was diagnosed with sleep apnea in the past but didn't find the CPAP  helpful so discontinued using it.  Psychiatric history was denied. He denied depressed mood but admitted he gets agitated more easily now. He has not had any hallucinations or delusions. His appetite is good. He denied suicidal ideation or intention.   Social History: Born/Raised: [place], [place].  Lived in [place] for 30 years, has lived in [place] for 23 years.  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 Is currently on disability, will be retiring in May 2019. Marital history: Married to current wife for 16 years. They each had 2 young children from previous relationships when they got married. They also raised two grandchildren. Alcohol: None Tobacco: None  Medical History:  Past Medical History:  Diagnosis Date  . Amputated finger    a. L index d/t to dog bite.  . Atrial fibrillation with RVR ([place])    a. New onset diagnosed 11/20/2013, spont converted to NSR.  Marland Kitchen Concussion   . Coronary artery disease 09/2004, 04/2005, 02/2015   a. Stent to prox and mid RCA 08/2001. b. DES to RCA for ISR 08/2004. c. DES to [facility] for ISR 05/2005. d. Low risk nuc 10/2013 (done for CP in setting of new AF). e. PCI to the mid-RCA for in-stent restenosis with cutting balloon angioplasty f. PCI to an OM2 lesion  . Dyslipidemia    a. Intol of statins.  . Fracture acetabulum-closed ([place]) 2015  . Fractured pelvis ([place]) 2015  . Hypertension   . Lung nodule    , right upper lobe  . Macular degeneration    both eyes, receives shots in his eyes  . Obstructive sleep apnea   . OSA (obstructive sleep apnea)   . Prostate cancer ([place])   . RBBB (right bundle branch block)     Current medications:  Outpatient Encounter Medications as of 07/08/2017  Medication Sig  . amLODipine (NORVASC) 5 MG tablet TAKE 1 TABLET BY MOUTH EVERY DAY  . clopidogrel (PLAVIX) 75 MG tablet TAKE 1 TABLET BY MOUTH DAILY  . ELIQUIS 5 MG TABS tablet TAKE 1  TABLET BY MOUTH TWICE A DAY  . metoprolol succinate (TOPROL-XL) 25 MG 24 hr tablet Take 3 tablets (75 mg total) by mouth daily.  . Multiple Vitamins-Minerals (PRESERVISION AREDS) CAPS Take 1 capsule by mouth 2 (two) times daily.  . nitroGLYCERIN (NITROSTAT) 0.4 MG SL tablet PLACE ONE TABLET UNDER THE TONGUE EVERY 5 MINUTES AS NEEDED FOR CHEST PAIN  . NON FORMULARY as directed. tocotrienols are vitamin E supplement, Lutein-zeaxanthis, and piracetam  . Piracetam POWD Take 2 scoop by mouth daily.   . Psyllium (METAMUCIL PO) Take 15 mLs by mouth daily. Mix in water and drink  . Ubiquinol 100 MG CAPS Take 100 mg by mouth 2 (two) times daily.   Marland Kitchen VIGAMOX 0.5 % ophthalmic solution INSTILL ONE DROP INTO LEFT EYE 4 TIMES A DAY FOR 2 DAYS AFTER EACH MONTHLY EYE INJECTION   Facility-Administered Encounter Medications as of 07/08/2017  Medication  . cyanocobalamin ((VITAMIN B-12)) injection 1,000 mcg     Current Examination:  Behavioral Observations:  Appearance: Casually and appropriately dressed and groomed Gait: Ambulated independently, mildly shuffling gait Speech: Fluent; generally  normal rate, rhythm and volume.  Thought process: Linear, goal directed Affect: Full, euthymic Interpersonal: Very pleasant, appropriate Orientation: Oriented to person, place and most aspects of time (4 days off on the current date). Accurately named the current President and his predecessor.   Tests Administered: . Test of Premorbid Functioning (TOPF) . Wechsler Adult Intelligence Scale-Fourth Edition (WAIS-IV): Similarities, Music therapist, Coding and Digit Span subtests . Wechsler Memory Scale-Fourth Edition (WMS-IV) Older Adult Version (ages 59-90): Logical Memory I, II and Recognition subtests  . [employer] Verbal Learning Test - 2nd Edition (CVLT-2) Short Form . Repeatable Battery for the Assessment of Neuropsychological Status (RBANS) Form A:  Figure Copy and Recall subtests and Semantic Fluency  subtest . Neuropsychological Assessment Battery (NAB) Language Module, Form 1: Naming subtest . Boston Diagnostic Aphasia Examination: Complex Ideational Material subtest . Controlled Oral Word Association Test (COWAT) . Trail Making Test A and B . Clock drawing test . Geriatric Depression Scale (GDS) 15 Item . Generalized Anxiety Disorder - 7 item screener (GAD-7)  Test Results: Note: Standardized scores are presented only for use by appropriately trained professionals and to allow for any future test-retest comparison. These scores should not be interpreted without consideration of all the information that is contained in the rest of the report. The most recent standardization samples from the test publisher or other sources were used whenever possible to derive standard scores; scores were corrected for age, gender, ethnicity and education when available.   Test Scores:  Test Name Raw Score Standardized Score Descriptor  TOPF 38/70 SS= 99 Average  WAIS-IV Subtests     Similarities 25/36 ss= 12 High average  Block Design 24/66 ss= 10 Average  Coding 22/135 ss= 7 Low average  Digit Span Forward 11/16 ss= 12 High average  Digit Span Backward 6/16 ss= 8 Low end of average  WMS-IV Subtests     LM I 17/53 ss= 6 Low average  LM II 10/39 ss= 9 Average  LM II Recognition 16/23 Cum %: 26-50   RBANS Subtests     Figure Copy 17/20 Z= -0.2 Average  Figure Recall 19/20 Z= 1.9 Superior  Semantic Fluency 13/40 Z= -1.2 Low average  CVLT-II Scores     Trial 1 3/9 Z= -2.5 Impaired  Trial 4 4/9 Z= -2 Impaired  Trials 1-4 total 15/36 T= 27 Impaired  SD Free Recall 4/9 Z= -1.5 Borderline  LD Free Recall 3/9 Z= -1.5 Borderline  LD Cued Recall 4/9 Z= -1 Low average  Recognition Discriminability 7/9 hits, 2 false positives Z= -0.5 Average  Forced Choice Recognition 9/9  WNL  NAB Language subtest     Naming 31/31 T= 63 Superior  BDAE Subtest     Complex Ideational Material 8/12  Impaired   COWAT-FAS 16 T= 31 Borderline  COWAT-Animals 15 T= 47 Average  Trail Making Test A  81" 1 error T= 42 Low average  Trail Making Test B  138" 0 errors T= 52 Average  Clock Drawing   WNL  GDS-15 2/15  WNL  GAD-7 4/21  WNL      Description of Test Results:  Premorbid verbal intellectual abilities were estimated to have been within the average range based on a test of word reading. Psychomotor processing speed was low average. Basic auditory attention was high average while more complex auditory attention (ie working memory) was low end of average. Visual-spatial construction was average. Language abilities were variable. Specifically, confrontation naming was superior, and semantic verbal fluency was average, while auditory  comprehension of complex ideational material was impaired. With regard to verbal memory, encoding and acquisition of non-contextual information (i.e., word list) was impaired. After a brief distracter task, free recall was borderline impaired (4/9 items). After a [name], free recall was borderline impaired (3/9 items). Cued recall was low average (4/9 items). Performance on a yes/no recognition task was average overall. On another verbal memory test, encoding and acquisition of contextual auditory information (i.e., short stories) was low average. After a [name], free recall was average. Performance on a yes/no recognition task was within normal limits. With regard to non-verbal memory, delayed free recall of visual information was superior. Executive functioning was mostly intact. Mental flexibility and set-shifting were average on Trails B. Verbal fluency with phonemic search restrictions was borderline impaired. Verbal abstract reasoning was high average. Performance on a clock drawing task was intact. On self-report measures of mood, the patient's responses were not indicative of clinically significant depression or anxiety at the present time.    Clinical Impressions: Mild  cognitive impairment. Results of cognitive testing were largely within normal limits with several performances in the high average to superior range. Meanwhile, there were deficits in his ability to learn and retain new verbal information. Additionally, processing speed and phonemic verbal fluency were below expectation. Auditory comprehension of complex ideational material (requiring working memory) was impaired. Overall, his cognitive testing performances were much better than expected both given his history of multiple infarcts, history of severe vitamin B12 deficiency, and his age. I do wonder if his faithful participation in cognitive rehabilitation with [facility] enhanced his cognitive test-taking ability. Regardless, I do not think he meets diagnostic criteria for dementia based on these test results. His test results and current level of functioning instead reflect mild cognitive impairment. His cognitive profile is suggestive of mild frontal-subcortical impairment. I am surprised that neuroimaging showed such significant atrophy but only minimal microvascular ischemic disease, as his testing profile is more in line with vascular cognitive impairment. I cannot rule out an underlying, prodromal Alzheimer's disease, but many of his test results were inconsistent with this etiology. We will need to do re-evaluation in a year to help determine that.  Fortunately, there is no evidence of an underlying psychiatric disorder or mood disturbance.    Recommendations/Plan: Based on the findings of the present evaluation, the following recommendations are offered:  1. The patient reports that he used a CPAP for a number of years but after it stopped working, he did not notice any difference with not using it, so he discontinued use. His wife reports that he stopped snoring after losing weight. Re-evaluation of sleep apnea could be considered as, if it is still present and is untreated, it could be  contributing to cognitive impairment. 2. He is encouraged to continue his cognitive rehab program on his iPad, and to continue to engage in a variety of activities that provide other types of mental stimulation. 3. Optimal control of vascular risk factors is also recommended. 4. I would like to see Dominic [name] back in a year for re-evaluation in order to monitor cognitive status, track any progression of symptoms and further assist with treatment planning. We will probably need to schedule this about 6 months in advance given current wait times. 5. Dominic [name] will likely benefit from certain compensatory strategies for memory difficulties. Specifically, on testing, visual memory is intact and is much better than auditory memory. As such, all important information should be presented visually (eg in writing or pictoral form)  and in a frequently seen area. 6. He will continue B12 injections and supplementation for deficiency. His wife has not noticed any benefit cognitively since he started supplementation.   Feedback to Patient: Dominic [name] and his wife returned for a feedback appointment on 07/08/2017 to review the results of his neuropsychological evaluation with this provider. 30 minutes face-to-face time was spent reviewing his test results, my impressions and my recommendations as detailed above.    Total time spent on this patient's case: [id number] unit for interview with psychologist; [phone] units of testing by psychometrician under psychologist's supervision; [phone] and [phone] units for integration of patient data, interpretation of standardized test results and clinical data, clinical decision making, treatment planning and preparation of this report, and interactive feedback with review of results to the patient/family by psychologist.      Thank you for your referral of Dominic [name]. Please feel free to contact me if you have any questions or concerns regarding this report.

## 2017-07-07 NOTE — Telephone Encounter (Signed)
Pts wife calling wanting to schedule a vit B shot

## 2017-07-08 ENCOUNTER — Ambulatory Visit (INDEPENDENT_AMBULATORY_CARE_PROVIDER_SITE_OTHER): Payer: Medicare Other | Admitting: Psychology

## 2017-07-08 ENCOUNTER — Encounter: Payer: Self-pay | Admitting: Psychology

## 2017-07-08 ENCOUNTER — Ambulatory Visit (INDEPENDENT_AMBULATORY_CARE_PROVIDER_SITE_OTHER): Payer: Medicare Other | Admitting: *Deleted

## 2017-07-08 DIAGNOSIS — E538 Deficiency of other specified B group vitamins: Secondary | ICD-10-CM | POA: Diagnosis not present

## 2017-07-08 DIAGNOSIS — I6349 Cerebral infarction due to embolism of other cerebral artery: Secondary | ICD-10-CM

## 2017-07-08 DIAGNOSIS — G3184 Mild cognitive impairment, so stated: Secondary | ICD-10-CM

## 2017-07-08 DIAGNOSIS — R413 Other amnesia: Secondary | ICD-10-CM | POA: Diagnosis not present

## 2017-07-08 NOTE — Patient Instructions (Addendum)
Test results do not suggest dementia at this time.  You had many areas of strength, but your ability to learn and remember new auditory information was reduced. Meanwhile, visual memory was superior. Additionally, processing speed was somewhat reduced. When a person has mild deficits on testing, we call this Mild Cognitive Impairment (see attached information).  I cannot rule out developing Alzheimer's disease. It is too early to tell. But you have strengths in many areas on testing where we would expect impairments if Alzheimer's disease were present. We will do re-testing in a year.  In the meantime, if you are prescribed a CPAP for sleep apnea, it is recommended that you use this. (See attached information.) This can enhance cognitive functioning. You should continue your cognitive rehab program and stay engaged in a variety of activities. You should use visual cues when you want to remember something, or to be reminded of important information.  Continue B12 supplementation.

## 2017-07-08 NOTE — Progress Notes (Signed)
Patient given Vitamin B12 1000 mcg injection in L deltoid. Tolerated well, bandaid applied.

## 2017-07-21 ENCOUNTER — Ambulatory Visit: Payer: Medicare Other

## 2017-07-24 ENCOUNTER — Encounter (INDEPENDENT_AMBULATORY_CARE_PROVIDER_SITE_OTHER): Payer: Medicare Other | Admitting: Ophthalmology

## 2017-07-24 DIAGNOSIS — H35033 Hypertensive retinopathy, bilateral: Secondary | ICD-10-CM | POA: Diagnosis not present

## 2017-07-24 DIAGNOSIS — I10 Essential (primary) hypertension: Secondary | ICD-10-CM | POA: Diagnosis not present

## 2017-07-24 DIAGNOSIS — H2513 Age-related nuclear cataract, bilateral: Secondary | ICD-10-CM | POA: Diagnosis not present

## 2017-07-24 DIAGNOSIS — H353231 Exudative age-related macular degeneration, bilateral, with active choroidal neovascularization: Secondary | ICD-10-CM

## 2017-07-24 DIAGNOSIS — H43813 Vitreous degeneration, bilateral: Secondary | ICD-10-CM | POA: Diagnosis not present

## 2017-07-25 ENCOUNTER — Ambulatory Visit: Payer: Medicare Other

## 2017-07-28 ENCOUNTER — Ambulatory Visit (INDEPENDENT_AMBULATORY_CARE_PROVIDER_SITE_OTHER): Payer: Medicare Other | Admitting: Neurology

## 2017-07-28 ENCOUNTER — Encounter: Payer: Self-pay | Admitting: Neurology

## 2017-07-28 VITALS — BP 112/58 | HR 63 | Wt 196.0 lb

## 2017-07-28 DIAGNOSIS — G3184 Mild cognitive impairment, so stated: Secondary | ICD-10-CM | POA: Diagnosis not present

## 2017-07-28 DIAGNOSIS — E538 Deficiency of other specified B group vitamins: Secondary | ICD-10-CM

## 2017-07-28 DIAGNOSIS — R471 Dysarthria and anarthria: Secondary | ICD-10-CM | POA: Diagnosis not present

## 2017-07-28 DIAGNOSIS — I6349 Cerebral infarction due to embolism of other cerebral artery: Secondary | ICD-10-CM | POA: Diagnosis not present

## 2017-07-28 DIAGNOSIS — G4733 Obstructive sleep apnea (adult) (pediatric): Secondary | ICD-10-CM | POA: Diagnosis not present

## 2017-07-28 NOTE — Patient Instructions (Addendum)
Sleep evaluation Return to clinic in one year. Will refer to Dr. Si Raider Speech Therapy Repeat B12 testing in 6 months (March)

## 2017-07-28 NOTE — Progress Notes (Signed)
[facility]    Provider:  Dr Jaynee Eagles Referring Provider: Lawerance Cruel, MD Primary Care Physician:  Lawerance Cruel, MD  CC:  CVA and memory loss  Interval history July 28, 2017: Patient returns today after formal neurocognitive testing revealed mild cognitive impairment however patient did above average and superior on many of the aspects of the testing. However there were deficits in ability to learn and retain new verbal information, processing speed and verbal fluency were below expectation, auditory comprehension was impaired.  Testing did not correspond with MRI findings which showed moderately severe generalized cortical atrophy.  Reviewed these images with patient and wife, answered questions, MRI did not change compared to March 14, 2015.  Very minimal chronic microvascular ischemic change.  Discussed this testing, next steps which include follow-up in 1 year.  Also patient has a history of sleep apnea, it was suggested that he might have this repeated which I think is an excellent idea.  He also has B12 deficiency which we found his B12 was less than 150 which is quite low, we will repeat this in 6 months he has been on injections.  We will send him to speech therapy for his dysarthria  HPI:  Dominic [name] is a 84 y.o. male here as a referral from Dr. Harrington Challenger for CVA. PMHx OSA, RBBB, HTN, HLD, CAD, afib with rvr. Patient is here for memory problems since CVA. Strokes were in 2016. Wife is here provides most information. Cannot hold onto concepts well. Also with balance problems. Has been through cognitive behavioral therapy with speech therapy. Her a review of records, patient underwent cardiac catheterization in late September and percutaneous intervention, had significant sensation of dizziness and feeling off balance. He felt unsteady with his gait. Patient had bilateral punctate acute infarcts right greater than left likely embolic related to Procedure. Most  likely due to A. fib hold off and all questions due to temporal relationship.Here with his wife. After the procedure he was not the same. He has been struggling since the stroke, he struggling with balance (but he does a PMHx of neuropathy in the feet). He has had physical and speech therapy. But he has been having short-term memory problems for several years beforehand. He saw a nutritionist and they recommend some medication that helped. He fell at work, he tripped over something on the group and now he is on administrative leave. The memory improved back to baseline after the stroke. But memory has worsened. Things did improve with speech therapy recently. Mother had dementia.  Wife pays the bills but husband says he wouldhave difficulty paying the bills. Even simple bill keeping would be difficult. He finds it dificult to read. He has decreased concentration. He has some minimal difficulty with the day and date but not the year but it is because he doesn't work or notice the day or date. He is having difficulty driving, gets into some "scrapes". Wife drives mostly. No hallucinations or delusions. They don't get out much so unclear if he has difficulty remembering people. But he walks into the kitchen and firgets what he is doing. No mood difficulties, no depression or anxiety.No other focal neurologic deficits, associated symptoms, inciting events or modifiable factors.  Reviewed notes, labs and imaging from outside physicians, which showed:  MRI brain  03/13/2017: Personally reviewed MRI of the brain images (also reviewed with patient and his wife) and agree with the following: Multiple small areas of infarct involving the right frontal lobe,right parietal  lobe, and left parietal lobe. These are consistent with acute embolic infarction.  Review of Systems: Patient complains of symptoms per HPI as well as the following symptoms: memory loss, imbalance. Pertinent negatives and positives per HPI. All others  negative.   Social History   Socioeconomic History  . Marital status: Married    Spouse name: Not on file  . Number of children: 6  . Years of education: Not on file  . Highest education level: Some college, no degree  Social Needs  . Financial resource strain: Not on file  . Food insecurity - worry: Not on file  . Food insecurity - inability: Not on file  . Transportation needs - medical: Not on file  . Transportation needs - non-medical: Not on file  Occupational History  . Not on file  Tobacco Use  . Smoking status: Never Smoker  . Smokeless tobacco: Never Used  Substance and Sexual Activity  . Alcohol use: No  . Drug use: No  . Sexual activity: Not on file  Other Topics Concern  . Not on file  Social History Narrative   Lives at home with his wife   Right handed   Drinks no caffeine    Family History  Problem Relation Age of Onset  . Emphysema Mother   . Cancer Father     Past Medical History:  Diagnosis Date  . Amputated finger    a. L index d/t to dog bite.  . Atrial fibrillation with RVR ([place])    a. New onset diagnosed 11/20/2013, spont converted to NSR.  Marland Kitchen Cataracts, bilateral   . Concussion   . Coronary artery disease 09/2004, 04/2005, 02/2015   a. Stent to prox and mid RCA 08/2001. b. DES to RCA for ISR 08/2004. c. DES to [facility] for ISR 05/2005. d. Low risk nuc 10/2013 (done for CP in setting of new AF). e. PCI to the mid-RCA for in-stent restenosis with cutting balloon angioplasty f. PCI to an OM2 lesion  . Dyslipidemia    a. Intol of statins.  . Fracture acetabulum-closed ([place]) 2015  . Fractured pelvis ([place]) 2015  . Hypertension   . Lung nodule    , right upper lobe  . Macular degeneration    both eyes, receives shots in his eyes  . Obstructive sleep apnea   . OSA (obstructive sleep apnea)   . Prostate cancer ([place])   . RBBB (right bundle branch block)     Past Surgical History:  Procedure Laterality Date  . ANTERIOR CRUCIATE LIGAMENT REPAIR Right    . CARDIAC CATHETERIZATION N/A 03/10/2015   Procedure: Left Heart Cath and Coronary Angiography;  Surgeon: Lorretta Harp, MD;  Location: [place] [facility];  Service: Cardiovascular;  Laterality: N/A;  . CARDIAC CATHETERIZATION  03/13/2015   Procedure: Coronary Stent Intervention;  Surgeon: Peter M Martinique, MD;  Location: [place] [facility];  Service: Cardiovascular;;  . CARDIAC CATHETERIZATION  03/13/2015   Procedure: Intravascular Pressure Wire/FFR Study;  Surgeon: Peter M Martinique, MD;  Location: [place] [facility];  Service: Cardiovascular;;  . CORONARY STENT PLACEMENT    . L knee ligament replacement    . PROSTATECTOMY    . TONSILLECTOMY    . TOOTH EXTRACTION      Current Outpatient Medications  Medication Sig Dispense Refill  . amLODipine (NORVASC) 5 MG tablet TAKE 1 TABLET BY MOUTH EVERY DAY 30 tablet 5  . clopidogrel (PLAVIX) 75 MG tablet TAKE 1 TABLET BY MOUTH DAILY 90  tablet 3  . ELIQUIS 5 MG TABS tablet TAKE 1 TABLET BY MOUTH TWICE A DAY 180 tablet 0  . metoprolol succinate (TOPROL-XL) 25 MG 24 hr tablet Take 3 tablets (75 mg total) by mouth daily. 270 tablet 1  . Multiple Vitamins-Minerals (PRESERVISION AREDS) CAPS Take 1 capsule by mouth 2 (two) times daily.    . NON FORMULARY as directed. tocotrienols    . Piracetam POWD Take 2 scoop by mouth daily.     Marland Kitchen Ubiquinol 100 MG CAPS Take 100 mg by mouth 2 (two) times daily.     Marland Kitchen VIGAMOX 0.5 % ophthalmic solution INSTILL ONE DROP INTO LEFT EYE 4 TIMES A DAY FOR 2 DAYS AFTER EACH MONTHLY EYE INJECTION  12  . nitroGLYCERIN (NITROSTAT) 0.4 MG SL tablet PLACE ONE TABLET UNDER THE TONGUE EVERY 5 MINUTES AS NEEDED FOR CHEST PAIN (Patient not taking: Reported on 07/28/2017) 25 tablet 3   Current Facility-Administered Medications  Medication Dose Route Frequency Provider Last Rate Last Dose  . cyanocobalamin ((VITAMIN B-12)) injection 1,000 mcg  1,000 mcg Intramuscular Q30 days Sarina Ill B, MD   1,000 mcg at 07/08/17 1154     Allergies as of 07/28/2017 - Review Complete 07/28/2017  Allergen Reaction Noted  . Statins Other (See Comments) 04/28/2013  . Zetia [ezetimibe] Other (See Comments) 04/28/2013    Vitals: BP (!) 112/58 (BP Location: Right Arm, Patient Position: Sitting)   Pulse 63   Wt 196 lb (88.9 kg)   BMI 27.34 kg/m  Last Weight:  Wt Readings from Last 1 Encounters:  07/28/17 196 lb (88.9 kg)   Last Height:   Ht Readings from Last 1 Encounters:  02/03/17 5\' 11"  (1.803 m)    Physical exam: Exam: Gen: NAD, conversant, Orofacial dyskinesias              CV: RRR, no MRG. No Carotid Bruits. No peripheral edema, warm, nontender Eyes: Conjunctivae clear without exudates or hemorrhage  Neuro: Detailed Neurologic Exam  Speech:    Speech is dysarthric without aphasia with normal comprehension.  Cognition:     MMSE - Mini Mental State Exam 03/25/2017  Orientation to time 5  Orientation to Place 5  Registration 3  Attention/ Calculation 5  Recall 1  Language- name 2 objects 2  Language- repeat 1  Language- follow 3 step command 3  Language- read & follow direction 1  Write a sentence 1  Copy design 1  Total score 28   Cranial Nerves:    The pupils are equal, round, and reactive to light. Attempted funduscopic exam could not visualize. Visual fields are full to finger confrontation. Extraocular movements are intact. Trigeminal sensation is intact and the muscles of mastication are normal. The face is symmetric. The palate elevates in the midline. Hearing impaired. Voice is normal. Shoulder shrug is normal. The tongue has normal motion without fasciculations.   Coordination:    No dysmetria noted  Gait:    Wide based, imbalance, cannot heel or toe or tandem   Motor Observation:    Orofacial dyskinesias Tone:    Normal muscle tone.    Posture:    Posture is normal. normal erect    Strength:    Strength is V/V in the upper and lower limbs.      Sensation: intact to LT      Reflex Exam:  DTR's:    Deep tendon reflexes in the upper are brisk and in the lower extremities are hyporeflexic bilaterally.  Toes:    The toes are downgoing bilaterally.   Clonus:    Clonus is absent.  Assessment/Plan:  This is an 84 year old male with a family history of Alzheimer's here for progressive memory loss. Patient had an embolic shower of strokes in 2016 however patient feels as though he returned back to his baseline at that time following strokes but memory has since declined. He would like to know if this is onset of dementia. MMSE was 28 out of 30 but given clinical history this may be mild cognitive impairment or mild/early Alzheimer's dementia. Recommend complete evaluation.  -Formal neurocognitive testing revealed mild cognitive impairment. Patient did above average and superior on many of the aspects of the testing.However there were deficits in ability to learn and retain new verbal information, processing speed and verbal fluency were below expectation, auditory comprehension was impaired.  Testing did not correspond with MRI findings which showed moderately severe generalized cortical atrophy.  - MRi brain showed moderately severe generalized cortical atrophy but this was unchanged from 2016   -B12 deficiency:  B12 wasextremely low less than 150, he continues to have injections will retest in several months  - Sleep Eval: Patient was diagnosed with obstructive sleep apnea in the past, he discontinued on his own.  He stopped snoring after losing weight however given his strokes, cognitive impairments, it was suggested by Dr. Marion Downer that he possibly should have a repeat sleep evaluation which I agree with as this could be contributing to cognitive impairment.  -Patient will retest his formal neurocognitive exam in 1 year for Dr. Marion Downer  -Patient has difficulty with speech, slurred speech since his stroke, problems with auditory processing per neuropsych testing, will  refer to speech therapy.   Orders Placed This Encounter  Procedures  . B12 and Folate Panel  . Methylmalonic acid, serum  . Ambulatory referral to Sleep Studies  . Ambulatory referral to Sleep Studies  . Ambulatory referral to Speech Therapy   Cc: Dr. Lovena Le, MD  [facility] [address] [place], [place] [address]  Phone [phone] Fax [phone]  A total of 40 minutes was spent in with this patient and wife face-to-face. Over half this time was spent on counseling patient on the MCI, OSA, dysarthria, b12 deficiency diagnosis and different therapeutic options available.

## 2017-08-08 ENCOUNTER — Ambulatory Visit (INDEPENDENT_AMBULATORY_CARE_PROVIDER_SITE_OTHER): Payer: Medicare Other

## 2017-08-08 DIAGNOSIS — E538 Deficiency of other specified B group vitamins: Secondary | ICD-10-CM

## 2017-08-08 MED ORDER — CYANOCOBALAMIN 1000 MCG/ML IJ SOLN
1000.0000 ug | Freq: Once | INTRAMUSCULAR | Status: AC
Start: 2017-08-08 — End: 2017-08-08
  Administered 2017-08-08: 1000 ug via INTRAMUSCULAR

## 2017-08-15 HISTORY — PX: CATARACT EXTRACTION: SUR2

## 2017-08-18 ENCOUNTER — Other Ambulatory Visit: Payer: Self-pay | Admitting: Cardiology

## 2017-08-18 DIAGNOSIS — H25013 Cortical age-related cataract, bilateral: Secondary | ICD-10-CM | POA: Diagnosis not present

## 2017-08-18 DIAGNOSIS — H401212 Low-tension glaucoma, right eye, moderate stage: Secondary | ICD-10-CM | POA: Diagnosis not present

## 2017-08-18 DIAGNOSIS — H401223 Low-tension glaucoma, left eye, severe stage: Secondary | ICD-10-CM | POA: Diagnosis not present

## 2017-08-18 DIAGNOSIS — H2513 Age-related nuclear cataract, bilateral: Secondary | ICD-10-CM | POA: Diagnosis not present

## 2017-08-18 DIAGNOSIS — H2512 Age-related nuclear cataract, left eye: Secondary | ICD-10-CM | POA: Diagnosis not present

## 2017-08-21 NOTE — Progress Notes (Signed)
Cardiology Office Note   Date:  08/22/2017   ID:  Dominic, [name] 08/17/1933, MRN [id number]  PCP:  Lawerance Cruel, MD  Cardiologist: Martinique Pizzimenti Martinique MD  Chief Complaint  Patient presents with  . Coronary Artery Disease      History of Present Illness: Dominic [name] is a 84 y.o. male who is seen for follow up CAD and Afib.    He has had known episodes of paroxysmal atrial fibrillation since June 2015. He has been on long-term anticoagulation with Eliquis.   The patient has a history of known coronary disease. He has had prior stents.with  BMS to the RCA and subsequent Cypher DES to the RCA in 3/06. LHC in 12/06 demonstrated significant RCA ISR which was treated with a Taxus DES. Other history includes HTN, HL, sleep apnea, PAF.   Admitted in 6/15 with AF with RVR. Inpatient Myoview was low risk.   On 03/09/15 the patient presented to the emergency room with a non-STEMI. He underwent cath on 03/10/2015 w/ successful PCI to the mid-RCA for in-stent restenosis with cutting balloon angioplasty and PCI to an OM2 lesion. There was residual LAD/D1 bifurcation lesion and staged PCI was done 03/13/2015. Following this procedure the patient developed ataxia, speech difficulties and was diagnosed with embolic CVA.   On follow up today he states he lost his job due to his impaired balance and ataxia.  He reports Neuro testing demonstrated good cognitive function. Still working with speech therapy. He denies any angina. He is going to repeat a sleep study. States CPAP therapy never really helped. No bleeding issues.  He has been eating a whole food/plant based diet now. He is intolerant of statins and Zetia.   Past Medical History:  Diagnosis Date  . Amputated finger    a. L index d/t to dog bite.  . Atrial fibrillation with RVR ([place])    a. New onset diagnosed 11/20/2013, spont converted to NSR.  Marland Kitchen Cataracts, bilateral   . Concussion   . Coronary artery disease 09/2004, 04/2005, 02/2015     a. Stent to prox and mid RCA 08/2001. b. DES to RCA for ISR 08/2004. c. DES to [facility] for ISR 05/2005. d. Low risk nuc 10/2013 (done for CP in setting of new AF). e. PCI to the mid-RCA for in-stent restenosis with cutting balloon angioplasty f. PCI to an OM2 lesion  . Dyslipidemia    a. Intol of statins.  . Fracture acetabulum-closed ([place]) 2015  . Fractured pelvis ([place]) 2015  . Hypertension   . Lung nodule    , right upper lobe  . Macular degeneration    both eyes, receives shots in his eyes  . Obstructive sleep apnea   . OSA (obstructive sleep apnea)   . Prostate cancer ([place])   . RBBB (right bundle branch block)     Past Surgical History:  Procedure Laterality Date  . ANTERIOR CRUCIATE LIGAMENT REPAIR Right   . CARDIAC CATHETERIZATION N/A 03/10/2015   Procedure: Left Heart Cath and Coronary Angiography;  Surgeon: Lorretta Harp, MD;  Location: [place] [facility];  Service: Cardiovascular;  Laterality: N/A;  . CARDIAC CATHETERIZATION  03/13/2015   Procedure: Coronary Stent Intervention;  Surgeon: Rhandi Despain M Martinique, MD;  Location: [place] [facility];  Service: Cardiovascular;;  . CARDIAC CATHETERIZATION  03/13/2015   Procedure: Intravascular Pressure Wire/FFR Study;  Surgeon: Edvardo Honse M Martinique, MD;  Location: [place] [facility];  Service: Cardiovascular;;  . CORONARY STENT  PLACEMENT    . L knee ligament replacement    . PROSTATECTOMY    . TONSILLECTOMY    . TOOTH EXTRACTION       Current Outpatient Medications  Medication Sig Dispense Refill  . amLODipine (NORVASC) 5 MG tablet TAKE 1 TABLET BY MOUTH EVERY DAY 30 tablet 5  . clopidogrel (PLAVIX) 75 MG tablet TAKE 1 TABLET BY MOUTH DAILY 90 tablet 3  . ELIQUIS 5 MG TABS tablet TAKE 1 TABLET BY MOUTH TWICE A DAY 180 tablet 0  . metoprolol succinate (TOPROL-XL) 25 MG 24 hr tablet Take 3 tablets (75 mg total) by mouth daily. 270 tablet 1  . Multiple Vitamins-Minerals (PRESERVISION AREDS) CAPS Take 1 capsule by mouth 2 (two) times daily.    .  nitroGLYCERIN (NITROSTAT) 0.4 MG SL tablet PLACE ONE TABLET UNDER THE TONGUE EVERY 5 MINUTES AS NEEDED FOR CHEST PAIN 25 tablet 3  . NON FORMULARY as directed. tocotrienols    . Piracetam POWD Take 2 scoop by mouth daily.     Marland Kitchen Ubiquinol 100 MG CAPS Take 100 mg by mouth 2 (two) times daily.     Marland Kitchen VIGAMOX 0.5 % ophthalmic solution INSTILL ONE DROP INTO LEFT EYE 4 TIMES A DAY FOR 2 DAYS AFTER EACH MONTHLY EYE INJECTION  12   No current facility-administered medications for this visit.     Allergies:   Statins and Zetia [ezetimibe]    Social History:  The patient  reports that  has never smoked. he has never used smokeless tobacco. He reports that he does not drink alcohol or use drugs.   Family History:  The patient's family history includes Cancer in his father; Emphysema in his mother.    ROS:  Please see the history of present illness.   Otherwise, review of systems are positive for none.   All other systems are reviewed and negative.    PHYSICAL EXAM: VS:  BP 120/62   Pulse 81   Ht 5\' 11"  (1.803 m)   Wt 196 lb (88.9 kg)   BMI 27.34 kg/m  , BMI Body mass index is 27.34 kg/m. GENERAL:  Well appearing, WM in NAD HEENT:  PERRL, EOMI, sclera are clear. Oropharynx is clear. NECK:  No jugular venous distention, carotid upstroke brisk and symmetric, no bruits, no thyromegaly or adenopathy LUNGS:  Clear to auscultation bilaterally CHEST:  Unremarkable HEART:  RRR,  PMI not displaced or sustained,S1 and S2 within normal limits, no S3, no S4: no clicks, no rubs, no murmurs ABD:  Soft, nontender. BS +, no masses or bruits. No hepatomegaly, no splenomegaly EXT:  2 + pulses throughout, no edema, no cyanosis no clubbing SKIN:  Warm and dry.  No rashes NEURO:  Alert and oriented x 3. Cranial nerves II through XII intact. Some ataxia.  PSYCH:  Cognitively intact     EKG:  EKG is  Not ordered today.   Recent Labs: 03/25/2017: TSH 1.570    Lipid Panel    Component Value Date/Time    CHOL 186 11/27/2015 0919   TRIG 246 (H) 11/27/2015 0919   HDL 29 (L) 11/27/2015 0919   CHOLHDL 6.4 (H) 11/27/2015 0919   VLDL 49 (H) 11/27/2015 0919   LDLCALC 108 11/27/2015 0919   LDLDIRECT 146.0 12/28/2014 1057    Labs dated 05/13/17: cholesterol 257, triglycerides 282, HDL 42, LDL 158. CMET normal.   Wt Readings from Last 3 Encounters:  08/22/17 196 lb (88.9 kg)  07/28/17 196 lb (88.9 kg)  03/25/17 190 lb (86.2 kg)        ASSESSMENT AND PLAN:  1.  Coronary artery disease. History of multiple stents as noted above, most recently 03/10/15 with PTCA of the RCA for instent restenosis, DES of OM, and LAD stenting. Given multiple episodes of in stent restenosis in the RCA with first generation DES I would recommend continued long-term Plavix unless he has significant bleeding. No ASA since on Eliquis.  2.  Paroxysmal atrial fibrillation, maintaining normal sinus rhythm.  Continue long-term Apixaban for CHA2DS2-VASc Score  4 3. CVA- embolic post cath procedure with some persistent ataxia and slurred speech.  4.  Hypercholesterolemia, intolerant of statins and Zetia. Recommend evaluation with our lipid clinic to see if he is a candidate for PCSK 9 inhibitor.  5.  Essential hypertension, controlled on current medication 6.  History of sleep apnea, patient is on CPAP therapy-planning to repeat sleep study.  Follow up in 6 months.    Current medicines are reviewed at length with the patient today.  The patient does not have concerns regarding medicines.  The following changes have been made:  no change  Labs/ tests ordered today include:   No orders of the defined types were placed in this encounter.   Signed, Jnaya Butrick Martinique MD, [facility]    08/22/2017 3:30 PM    [place]

## 2017-08-22 ENCOUNTER — Ambulatory Visit (INDEPENDENT_AMBULATORY_CARE_PROVIDER_SITE_OTHER): Payer: Medicare Other | Admitting: Cardiology

## 2017-08-22 ENCOUNTER — Encounter: Payer: Self-pay | Admitting: Cardiology

## 2017-08-22 VITALS — BP 120/62 | HR 81 | Ht 71.0 in | Wt 196.0 lb

## 2017-08-22 DIAGNOSIS — I4510 Unspecified right bundle-branch block: Secondary | ICD-10-CM

## 2017-08-22 DIAGNOSIS — I480 Paroxysmal atrial fibrillation: Secondary | ICD-10-CM

## 2017-08-22 DIAGNOSIS — I2510 Atherosclerotic heart disease of native coronary artery without angina pectoris: Secondary | ICD-10-CM | POA: Diagnosis not present

## 2017-08-22 DIAGNOSIS — Z9861 Coronary angioplasty status: Secondary | ICD-10-CM | POA: Diagnosis not present

## 2017-08-22 DIAGNOSIS — E7800 Pure hypercholesterolemia, unspecified: Secondary | ICD-10-CM | POA: Diagnosis not present

## 2017-08-22 NOTE — Patient Instructions (Signed)
Continue your current therapy  I would continue Plavix and Eliquis as long as there are no major bleeding issues  We will schedule your to see our Pharm D to explore new cholesterol therapy

## 2017-08-24 ENCOUNTER — Other Ambulatory Visit: Payer: Self-pay | Admitting: Cardiology

## 2017-08-26 DIAGNOSIS — H25812 Combined forms of age-related cataract, left eye: Secondary | ICD-10-CM | POA: Diagnosis not present

## 2017-08-26 DIAGNOSIS — H2512 Age-related nuclear cataract, left eye: Secondary | ICD-10-CM | POA: Diagnosis not present

## 2017-09-01 ENCOUNTER — Ambulatory Visit: Payer: Medicare Other | Attending: Neurology | Admitting: Speech Pathology

## 2017-09-01 DIAGNOSIS — R41841 Cognitive communication deficit: Secondary | ICD-10-CM | POA: Insufficient documentation

## 2017-09-01 DIAGNOSIS — R471 Dysarthria and anarthria: Secondary | ICD-10-CM | POA: Diagnosis not present

## 2017-09-01 NOTE — Patient Instructions (Signed)
Practice reading the sheets given by your therapist out loud for 20 minutes, twice a day. Remember to use the strategies we practiced in therapy:  S   Slow L   Loud O  Overarticulate P   Pause
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
## 2017-09-02 ENCOUNTER — Ambulatory Visit (INDEPENDENT_AMBULATORY_CARE_PROVIDER_SITE_OTHER): Payer: Medicare Other | Admitting: Neurology

## 2017-09-02 ENCOUNTER — Encounter: Payer: Self-pay | Admitting: Neurology

## 2017-09-02 VITALS — BP 169/82 | HR 84 | Ht 70.0 in | Wt 198.0 lb

## 2017-09-02 DIAGNOSIS — I2510 Atherosclerotic heart disease of native coronary artery without angina pectoris: Secondary | ICD-10-CM | POA: Diagnosis not present

## 2017-09-02 DIAGNOSIS — G4719 Other hypersomnia: Secondary | ICD-10-CM | POA: Diagnosis not present

## 2017-09-02 DIAGNOSIS — G4733 Obstructive sleep apnea (adult) (pediatric): Secondary | ICD-10-CM

## 2017-09-02 DIAGNOSIS — Z9861 Coronary angioplasty status: Secondary | ICD-10-CM

## 2017-09-02 DIAGNOSIS — I480 Paroxysmal atrial fibrillation: Secondary | ICD-10-CM

## 2017-09-02 DIAGNOSIS — G3184 Mild cognitive impairment, so stated: Secondary | ICD-10-CM

## 2017-09-02 DIAGNOSIS — G479 Sleep disorder, unspecified: Secondary | ICD-10-CM | POA: Diagnosis not present

## 2017-09-02 NOTE — Progress Notes (Signed)
Subjective:    Patient ID: Dominic [name] is a 84 y.o. male.  HPI     Star Age, MD, PhD [facility] [address]O. [place], [place] [address]  Dear Berta Minor,   I saw your patient, Dominic [name], upon your kind request in my clinic today for initial consultation of his sleep disorder, in particular, re-evaluation of his prior diagnosis of obstructive sleep apnea. The patient is unaccompanied today. As you know, Dominic [name] is an 84 year old right-handed gentleman with an underlying complex medical history of coronary artery disease with status post stent placement, hyperlipidemia, hypertension, history of lung nodule, macular degeneration, parox. atrial fibrillation, bilateral cataracts, mild cognitive impairment, prostate cancer with status post prostatectomy, right bundle branch block, and overweight state, who was previously diagnosed with obstructive sleep apnea and placed on CPAP therapy. Patient is currently no longer using a CPAP machine. He did not feel that CPAP was helpful. He had a ResMed F9 Elite CPAP machine, pressure set at 9 cm. He last used his CPAP about a year ago. He had sleep study testing nearly 10 years ago. His baseline sleep study from 04/19/2008 showed an AHI of 11.5 per hour. He had a CPAP titration study on 05/09/2008, after which he was placed on AutoPap therapy. A CPAP download was not available for my review today. I reviewed your office note from 07/28/2017.  His Epworth sleepiness score is 15 out of 24, fatigue score is 22 out of 63. He is a non-smoker, no alcohol for 40 years, no daily caffeine. No FHx of OSA, as far as he knows. He has 4 kids, 15 GC, 3rd GGC on the way. Retired Electrical engineer. Had a tonsillectomy as a child. He does not have a set BT or rise time currently, as his wife has a variable sleep schedule. He does not report night to night nocturia, he does not have morning headaches. He would be willing to get  retested for sleep apnea.  His Past Medical History Is Significant For: Past Medical History:  Diagnosis Date  . Amputated finger    a. L index d/t to dog bite.  . Atrial fibrillation with RVR ([place])    a. New onset diagnosed 11/20/2013, spont converted to NSR.  Marland Kitchen Cataracts, bilateral   . Concussion   . Coronary artery disease 09/2004, 04/2005, 02/2015   a. Stent to prox and mid RCA 08/2001. b. DES to RCA for ISR 08/2004. c. DES to [facility] for ISR 05/2005. d. Low risk nuc 10/2013 (done for CP in setting of new AF). e. PCI to the mid-RCA for in-stent restenosis with cutting balloon angioplasty f. PCI to an OM2 lesion  . Dyslipidemia    a. Intol of statins.  . Fracture acetabulum-closed ([place]) 2015  . Fractured pelvis ([place]) 2015  . Hypertension   . Lung nodule    , right upper lobe  . Macular degeneration    both eyes, receives shots in his eyes  . Obstructive sleep apnea   . OSA (obstructive sleep apnea)   . Prostate cancer ([place])   . RBBB (right bundle branch block)     His Past Surgical History Is Significant For: Past Surgical History:  Procedure Laterality Date  . ANTERIOR CRUCIATE LIGAMENT REPAIR Right   . CARDIAC CATHETERIZATION N/A 03/10/2015   Procedure: Left Heart Cath and Coronary Angiography;  Surgeon: Lorretta Harp, MD;  Location: [facility];  Service: Cardiovascular;  Laterality: N/A;  .  CARDIAC CATHETERIZATION  03/13/2015   Procedure: Coronary Stent Intervention;  Surgeon: Peter M Martinique, MD;  Location: [facility];  Service: Cardiovascular;;  . CARDIAC CATHETERIZATION  03/13/2015   Procedure: Intravascular Pressure Wire/FFR Study;  Surgeon: Peter M Martinique, MD;  Location: [facility];  Service: Cardiovascular;;  . CORONARY STENT PLACEMENT    . L knee ligament replacement    . PROSTATECTOMY    . TONSILLECTOMY    . TOOTH EXTRACTION      His Family History Is Significant For: Family History  Problem Relation Age of Onset  . Emphysema Mother   .  Cancer Father     His Social History Is Significant For: Social History   Socioeconomic History  . Marital status: Married    Spouse name: None  . Number of children: 6  . Years of education: None  . Highest education level: Some college, no degree  Social Needs  . Financial resource strain: None  . Food insecurity - worry: None  . Food insecurity - inability: None  . Transportation needs - medical: None  . Transportation needs - non-medical: None  Occupational History  . None  Tobacco Use  . Smoking status: Never Smoker  . Smokeless tobacco: Never Used  Substance and Sexual Activity  . Alcohol use: No  . Drug use: No  . Sexual activity: None  Other Topics Concern  . None  Social History Narrative   Lives at home with his wife   Right handed   Drinks no caffeine    His Allergies Are:  Allergies  Allergen Reactions  . Statins Other (See Comments)    Myalgia  . Zetia [Ezetimibe] Other (See Comments)    myalgia  :   His Current Medications Are:  Outpatient Encounter Medications as of 09/02/2017  Medication Sig  . amLODipine (NORVASC) 5 MG tablet TAKE 1 TABLET BY MOUTH EVERY DAY  . clopidogrel (PLAVIX) 75 MG tablet TAKE 1 TABLET BY MOUTH DAILY  . ELIQUIS 5 MG TABS tablet TAKE 1 TABLET BY MOUTH TWICE A DAY  . metoprolol succinate (TOPROL-XL) 25 MG 24 hr tablet Take 3 tablets (75 mg total) by mouth daily.  . Multiple Vitamins-Minerals (PRESERVISION AREDS) CAPS Take 1 capsule by mouth 2 (two) times daily.  . nitroGLYCERIN (NITROSTAT) 0.4 MG SL tablet PLACE ONE TABLET UNDER THE TONGUE EVERY 5 MINUTES AS NEEDED FOR CHEST PAIN  . NON FORMULARY as directed. tocotrienols  . Piracetam POWD Take 2 scoop by mouth daily.   Marland Kitchen Ubiquinol 100 MG CAPS Take 100 mg by mouth 2 (two) times daily.   [place] Kitchen VIGAMOX 0.5 % ophthalmic solution INSTILL ONE DROP INTO LEFT EYE 4 TIMES A DAY FOR 2 DAYS AFTER EACH MONTHLY EYE INJECTION   No facility-administered encounter medications on file as of  09/02/2017.   :  Review of Systems:  Out of a complete 14 point review of systems, all are reviewed and negative with the exception of these symptoms as listed below: Review of Systems  Neurological:       Pt presents today to discuss his sleep. Pt has had a sleep study in the past and prescribed a cpap. Pt feels that his old cpap didn't help, so stopped using it. Pt thinks he used [employer] as his DME.  Epworth Sleepiness Scale 0= would never doze 1= slight chance of dozing 2= moderate chance of dozing 3= high chance of dozing  Sitting and reading: 2 Watching TV: 3 Sitting inactive  in a public place (ex. Theater or meeting): 1 As a passenger in a car for an hour without a break: 2 Lying down to rest in the afternoon: 2 Sitting and talking to someone: 2 Sitting quietly after lunch (no alcohol): 2 In a car, while stopped in traffic: 1 Total: 15     Objective:  Neurological Exam  Physical Exam Physical Examination:   Vitals:   09/02/17 1555  BP: (!) 169/82  Pulse: 84   General Examination: The patient is a very pleasant 84 y.o. male in no acute distress. He appears well-developed and well-nourished and well groomed.   HEENT: Normocephalic, atraumatic, pupils are equal, round and reactive to light and accommodation. Extraocular tracking is good without limitation to gaze excursion or nystagmus noted. Normal smooth pursuit is noted. Hearing is grossly intact. Tympanic membranes are clear bilaterally. Face is symmetric with normal facial animation and normal facial sensation. Speech is clear with no dysarthria noted. There is no hypophonia. There is no lip, neck/head, jaw or voice tremor. Neck is supple with full range of passive and active motion. There are no carotid bruits on auscultation. Oropharynx exam reveals: moderate mouth dryness, adequate dental hygiene with full dentures, and mild airway crowding, due to redundant soft palate. Mallampati is class II. Tongue protrudes  centrally and palate elevates symmetrically. Tonsils are absent. Neck size is 17 inches.   Chest: Clear to auscultation without wheezing, rhonchi or crackles noted.  Heart: S1+S2+0, regular and normal without murmurs, rubs or gallops noted.   Abdomen: Soft, non-tender and non-distended with normal bowel sounds appreciated on auscultation.  Extremities: There is missing finger tip R index finger.   Skin: Warm and dry without trophic changes noted.  Musculoskeletal: exam reveals no obvious joint deformities, tenderness or joint swelling or erythema.   Neurologically:  Mental status: The patient is awake, alert and oriented in all 4 spheres. His immediate and remote memory, attention, language skills and fund of knowledge are fairly appropriate.  Cranial nerves II - XII are as described above under HEENT exam. In addition: shoulder shrug is normal with equal shoulder height noted. Motor exam: Normal bulk, strength and tone is noted. There is no drift, tremor or rebound. Fine motor skills and coordination: grossly intact.  Cerebellar testing: No dysmetria or intention tremor. There is no truncal or gait ataxia.  Sensory exam: intact to light touch in the upper and lower extremities.  Gait, station and balance: He stands easily. No veering to one side is noted. No leaning to one side is noted. Posture is age-appropriate and stance is narrow based. Gait shows normal stride length and normal pace. No problems turning are noted.   Assessment and Plan:   In summary, ILAI HILLER is a very pleasant 84 y.o.-year old male with an underlying complex medical history of coronary artery disease with status post stent placement, hyperlipidemia, hypertension, history of lung nodule, macular degeneration, parox. atrial fibrillation, bilateral cataracts, mild cognitive impairment, prostate cancer with status post prostatectomy, right bundle branch block, and overweight state, whopresents for sleep consultation  with a prior diagnosis of obstructive sleep apnea. He tried CPAP but did not feel he benefited from it and gave up using his CPAP at least a year ago. He would be willing to get retested and consider CPAP therapy again. I had a long chat with the patient about my findings and the diagnosis of OSA, its prognosis and treatment options. We talked about medical treatments, surgical interventions and non-pharmacological approaches.  I explained in particular the risks and ramifications of untreated moderate to severe OSA, especially with respect to developing cardiovascular disease down the Road, including congestive heart failure, difficult to treat hypertension, cardiac arrhythmias, or stroke. Even type 2 diabetes has, in part, been linked to untreated OSA. Symptoms of untreated OSA include daytime sleepiness, memory problems, mood irritability and mood disorder such as depression and anxiety, lack of energy, as well as recurrent headaches, especially morning headaches. We talked about trying to maintain a healthy lifestyle in general, as well as the importance of weight control. I encouraged the patient to eat healthy, exercise daily and keep well hydrated, to keep a scheduled bedtime and wake time routine, to not skip any meals and eat healthy snacks in between meals. I advised the patient not to drive when feeling sleepy. I recommended the following at this time: sleep study with potential positive airway pressure titration. (We will score hypopneas at 4%).   I explained the sleep test procedure to the patient. I also explained the CPAP treatment option to the patient, who indicated that he would be willing to try CPAP if the need arises. I explained the importance of being compliant with PAP treatment, not only for insurance purposes but primarily to improve His symptoms, and for the patient's long term health benefit, including to reduce His cardiovascular risks. I answered all his questions today and the  patient was in agreement. I would like to see him back after the sleep study is completed and encouraged him to call with any interim questions, concerns, problems or updates.   Thank you very much for allowing me to participate in the care of this nice patient. If I can be of any further assistance to you please do not hesitate to talk to me.  Sincerely,   Star Age, MD, PhD

## 2017-09-02 NOTE — Patient Instructions (Signed)
Thank you for choosing [facility] for your sleep related care! It was nice to meet you today! I appreciate that you entrust me with your sleep related healthcare concerns. I hope, I was able to address at least some of your concerns today, and that I can help you feel reassured and also get better.    Here is what we discussed today and what we came up with as our plan for you:    Based on your symptoms and your exam I believe you may still be at risk for obstructive sleep apnea and would benefit from reevaluation as it has been many years and you need new supplies and updated machine. Therefore, I think we should proceed with a sleep study to determine how severe your sleep apnea is. If you have more than mild OSA, I want you to consider ongoing treatment with CPAP. Please remember, the risks and ramifications of moderate to severe obstructive sleep apnea or OSA are: Cardiovascular disease, including congestive heart failure, stroke, difficult to control hypertension, arrhythmias, and even type 2 diabetes has been linked to untreated OSA. Sleep apnea causes disruption of sleep and sleep deprivation in most cases, which, in turn, can cause recurrent headaches, problems with memory, mood, concentration, focus, and vigilance. Most people with untreated sleep apnea report excessive daytime sleepiness, which can affect their ability to drive. Please do not drive if you feel sleepy.   I will likely see you back after your sleep study to go over the test results and where to go from there. We will call you after your sleep study to advise about the results (most likely, you will hear from Kristen, my nurse) and to set up an appointment at the time, as necessary.    Our sleep lab administrative assistant will call you to schedule your sleep study. If you don't hear back from her by about 2 weeks from now, please feel free to call her at [phone]. You can leave a message with your phone  number and concerns, if you get the voicemail box. She will call back as soon as possible.     

## 2017-09-03 ENCOUNTER — Other Ambulatory Visit: Payer: Self-pay

## 2017-09-03 ENCOUNTER — Other Ambulatory Visit (HOSPITAL_COMMUNITY): Payer: Medicare Other

## 2017-09-03 ENCOUNTER — Observation Stay (HOSPITAL_COMMUNITY)
Admission: EM | Admit: 2017-09-03 | Discharge: 2017-09-05 | Disposition: A | Discharge status: Home / Self Care | Payer: Medicare Other | Attending: Cardiology | Admitting: Cardiology

## 2017-09-03 ENCOUNTER — Encounter (HOSPITAL_COMMUNITY): Payer: Self-pay

## 2017-09-03 ENCOUNTER — Emergency Department (HOSPITAL_COMMUNITY): Payer: Medicare Other

## 2017-09-03 DIAGNOSIS — Z79899 Other long term (current) drug therapy: Secondary | ICD-10-CM | POA: Diagnosis not present

## 2017-09-03 DIAGNOSIS — Z809 Family history of malignant neoplasm, unspecified: Secondary | ICD-10-CM | POA: Insufficient documentation

## 2017-09-03 DIAGNOSIS — Z7901 Long term (current) use of anticoagulants: Secondary | ICD-10-CM | POA: Insufficient documentation

## 2017-09-03 DIAGNOSIS — I4891 Unspecified atrial fibrillation: Secondary | ICD-10-CM

## 2017-09-03 DIAGNOSIS — Z825 Family history of asthma and other chronic lower respiratory diseases: Secondary | ICD-10-CM | POA: Insufficient documentation

## 2017-09-03 DIAGNOSIS — I480 Paroxysmal atrial fibrillation: Secondary | ICD-10-CM | POA: Insufficient documentation

## 2017-09-03 DIAGNOSIS — Z9861 Coronary angioplasty status: Secondary | ICD-10-CM

## 2017-09-03 DIAGNOSIS — I25110 Atherosclerotic heart disease of native coronary artery with unstable angina pectoris: Secondary | ICD-10-CM

## 2017-09-03 DIAGNOSIS — Z89022 Acquired absence of left finger(s): Secondary | ICD-10-CM | POA: Insufficient documentation

## 2017-09-03 DIAGNOSIS — Z7902 Long term (current) use of antithrombotics/antiplatelets: Secondary | ICD-10-CM | POA: Diagnosis not present

## 2017-09-03 DIAGNOSIS — R079 Chest pain, unspecified: Secondary | ICD-10-CM | POA: Diagnosis not present

## 2017-09-03 DIAGNOSIS — R7989 Other specified abnormal findings of blood chemistry: Secondary | ICD-10-CM

## 2017-09-03 DIAGNOSIS — I248 Other forms of acute ischemic heart disease: Secondary | ICD-10-CM | POA: Diagnosis not present

## 2017-09-03 DIAGNOSIS — H3530 Unspecified macular degeneration: Secondary | ICD-10-CM | POA: Insufficient documentation

## 2017-09-03 DIAGNOSIS — Z8546 Personal history of malignant neoplasm of prostate: Secondary | ICD-10-CM | POA: Diagnosis not present

## 2017-09-03 DIAGNOSIS — I2510 Atherosclerotic heart disease of native coronary artery without angina pectoris: Secondary | ICD-10-CM

## 2017-09-03 DIAGNOSIS — R748 Abnormal levels of other serum enzymes: Secondary | ICD-10-CM | POA: Diagnosis present

## 2017-09-03 DIAGNOSIS — I4510 Unspecified right bundle-branch block: Secondary | ICD-10-CM | POA: Diagnosis not present

## 2017-09-03 DIAGNOSIS — E785 Hyperlipidemia, unspecified: Secondary | ICD-10-CM | POA: Diagnosis not present

## 2017-09-03 DIAGNOSIS — E876 Hypokalemia: Secondary | ICD-10-CM | POA: Insufficient documentation

## 2017-09-03 DIAGNOSIS — I10 Essential (primary) hypertension: Secondary | ICD-10-CM | POA: Diagnosis present

## 2017-09-03 DIAGNOSIS — R778 Other specified abnormalities of plasma proteins: Secondary | ICD-10-CM

## 2017-09-03 DIAGNOSIS — Z888 Allergy status to other drugs, medicaments and biological substances status: Secondary | ICD-10-CM | POA: Diagnosis not present

## 2017-09-03 DIAGNOSIS — Z955 Presence of coronary angioplasty implant and graft: Secondary | ICD-10-CM | POA: Insufficient documentation

## 2017-09-03 DIAGNOSIS — Z8673 Personal history of transient ischemic attack (TIA), and cerebral infarction without residual deficits: Secondary | ICD-10-CM | POA: Diagnosis not present

## 2017-09-03 DIAGNOSIS — R0789 Other chest pain: Secondary | ICD-10-CM | POA: Diagnosis not present

## 2017-09-03 DIAGNOSIS — G4733 Obstructive sleep apnea (adult) (pediatric): Secondary | ICD-10-CM | POA: Insufficient documentation

## 2017-09-03 DIAGNOSIS — I214 Non-ST elevation (NSTEMI) myocardial infarction: Secondary | ICD-10-CM | POA: Diagnosis not present

## 2017-09-03 LAB — CBC
HCT: 44.7 % (ref 39.0–52.0)
HEMOGLOBIN: 15.2 g/dL (ref 13.0–17.0)
MCH: 29.1 pg (ref 26.0–34.0)
MCHC: 34 g/dL (ref 30.0–36.0)
MCV: 85.6 fL (ref 78.0–100.0)
Platelets: 162 10*3/uL (ref 150–400)
RBC: 5.22 MIL/uL (ref 4.22–5.81)
RDW: 13.3 % (ref 11.5–15.5)
WBC: 7.1 10*3/uL (ref 4.0–10.5)

## 2017-09-03 LAB — BASIC METABOLIC PANEL
ANION GAP: 10 (ref 5–15)
BUN: 14 mg/dL (ref 6–20)
CALCIUM: 8.8 mg/dL — AB (ref 8.9–10.3)
CHLORIDE: 108 mmol/L (ref 101–111)
CO2: 22 mmol/L (ref 22–32)
Creatinine, Ser: 0.8 mg/dL (ref 0.61–1.24)
GFR calc non Af Amer: >60 mL/min (ref >60)
Glucose, Bld: 126 mg/dL — ABNORMAL HIGH (ref 65–99)
Potassium: 3.2 mmol/L — ABNORMAL LOW (ref 3.5–5.1)
Sodium: 140 mmol/L (ref 135–145)

## 2017-09-03 LAB — TROPONIN I
TROPONIN I: 1.79 ng/mL — AB (ref <0.03)
Troponin I: 1.42 ng/mL (ref <0.03)

## 2017-09-03 LAB — I-STAT TROPONIN, ED
TROPONIN I, POC: 0.01 ng/mL (ref 0.00–0.08)
TROPONIN I, POC: 0.48 ng/mL — AB (ref 0.00–0.08)

## 2017-09-03 MED ORDER — METOPROLOL SUCCINATE ER 25 MG PO TB24
75.0000 mg | ORAL_TABLET | Freq: Every day | ORAL | Status: DC
  Administered 2017-09-04 – 2017-09-05 (×2): 75 mg via ORAL
  Filled 2017-09-03 (×2): qty 1

## 2017-09-03 MED ORDER — POTASSIUM CHLORIDE CRYS ER 20 MEQ PO TBCR
40.0000 meq | EXTENDED_RELEASE_TABLET | Freq: Once | ORAL | Status: AC
  Administered 2017-09-03: 40 meq via ORAL
  Filled 2017-09-03: qty 2

## 2017-09-03 MED ORDER — METOPROLOL TARTRATE 5 MG/5ML IV SOLN
10.0000 mg | Freq: Once | INTRAVENOUS | Status: AC
  Administered 2017-09-03: 10 mg via INTRAVENOUS
  Filled 2017-09-03: qty 10

## 2017-09-03 MED ORDER — AMLODIPINE BESYLATE 5 MG PO TABS
5.0000 mg | ORAL_TABLET | Freq: Every day | ORAL | Status: DC
  Administered 2017-09-04 – 2017-09-05 (×2): 5 mg via ORAL
  Filled 2017-09-03 (×2): qty 1

## 2017-09-03 MED ORDER — CLOPIDOGREL BISULFATE 75 MG PO TABS
75.0000 mg | ORAL_TABLET | Freq: Every day | ORAL | Status: DC
  Administered 2017-09-03 – 2017-09-05 (×3): 75 mg via ORAL
  Filled 2017-09-03 (×3): qty 1

## 2017-09-03 MED ORDER — METOPROLOL SUCCINATE ER 25 MG PO TB24
25.0000 mg | ORAL_TABLET | Freq: Every day | ORAL | Status: DC
  Administered 2017-09-03: 25 mg via ORAL
  Filled 2017-09-03: qty 1

## 2017-09-03 MED ORDER — APIXABAN 5 MG PO TABS
5.0000 mg | ORAL_TABLET | Freq: Two times a day (BID) | ORAL | Status: DC
  Administered 2017-09-03 – 2017-09-05 (×4): 5 mg via ORAL
  Filled 2017-09-03 (×6): qty 1

## 2017-09-03 MED ORDER — METOPROLOL SUCCINATE ER 50 MG PO TB24
50.0000 mg | ORAL_TABLET | Freq: Once | ORAL | Status: DC
  Filled 2017-09-03: qty 1

## 2017-09-03 MED ORDER — ACETAMINOPHEN 325 MG PO TABS: 650.0000 mg | ORAL_TABLET | ORAL | Status: DC | PRN

## 2017-09-03 MED ORDER — METOPROLOL SUCCINATE ER 50 MG PO TB24: 75.0000 mg | ORAL_TABLET | Freq: Every day | ORAL | Status: DC

## 2017-09-03 MED ORDER — ONDANSETRON HCL 4 MG/2ML IJ SOLN: 4.0000 mg | Freq: Four times a day (QID) | INTRAMUSCULAR | Status: DC | PRN
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
## 2017-09-03 NOTE — ED Notes (Signed)
Horton, MD notified of troponin.

## 2017-09-03 NOTE — H&P (Signed)
History & Physical    Patient ID: Dominic [name] MRN: [id number], DOB/AGE: August 10, 1933   Admit date: 09/03/2017   Primary Physician: Lawerance Cruel, MD Primary Cardiologist: Dr. Martinique    Patient Profile    84 yo male with PMH of CAD s/p stent to the mRCA, LAD/D1/OM, HTN, HL, sleep apnea, and PAF who presented in Afib RVR with chest tightness.   Past Medical History   Past Medical History:  Diagnosis Date  . Amputated finger    a. L index d/t to dog bite.  . Atrial fibrillation with RVR ([place])    a. New onset diagnosed 11/20/2013, spont converted to NSR.  Marland Kitchen Cataracts, bilateral   . Concussion   . Coronary artery disease 09/2004, 04/2005, 02/2015   a. Stent to prox and mid RCA 08/2001. b. DES to RCA for ISR 08/2004. c. DES to [facility] for ISR 05/2005. d. Low risk nuc 10/2013 (done for CP in setting of new AF). e. PCI to the mid-RCA for in-stent restenosis with cutting balloon angioplasty f. PCI to an OM2 lesion  . Dyslipidemia    a. Intol of statins.  . Fracture acetabulum-closed ([place]) 2015  . Fractured pelvis () 2015  . Hypertension   . Lung nodule    , right upper lobe  . Macular degeneration    both eyes, receives shots in his eyes  . Obstructive sleep apnea   . OSA (obstructive sleep apnea)   . Prostate cancer ([place])   . RBBB (right bundle branch block)     Past Surgical History:  Procedure Laterality Date  . ANTERIOR CRUCIATE LIGAMENT REPAIR Right   . CARDIAC CATHETERIZATION N/A 03/10/2015   Procedure: Left Heart Cath and Coronary Angiography;  Surgeon: Lorretta Harp, MD;  Location: [facility] CV LAB;  Service: Cardiovascular;  Laterality: N/A;  . CARDIAC CATHETERIZATION  03/13/2015   Procedure: Coronary Stent Intervention;  Surgeon: Peter M Martinique, MD;  Location: [facility] CV LAB;  Service: Cardiovascular;;  . CARDIAC CATHETERIZATION  03/13/2015   Procedure: Intravascular Pressure Wire/FFR Study;  Surgeon: Peter M Martinique, MD;  Location: [facility] CV LAB;   Service: Cardiovascular;;  . CORONARY STENT PLACEMENT    . L knee ligament replacement    . PROSTATECTOMY    . TONSILLECTOMY    . TOOTH EXTRACTION       Allergies  Allergies  Allergen Reactions  . Statins Other (See Comments)    Myalgia  . Zetia [Ezetimibe] Other (See Comments)    myalgia    History of Present Illness    Dominic [name] is a 84 yo male with PMH CAD s/p stent to the mRCA, LAD/D1/OM, HTN, HL, sleep apnea, and PAF. Had initial cath back in 3/06 with DES to the RCA with ISR in 12/06 on repeat cath treated with 2nd DES. Admitted back in /16 with NSTEMI and cathed with successful PCI to the [facility] for ISR with cutting balloon angioplasty and PCI to OM2. Underwent staged procedure to the LAD/D1 but developed ataxia, speech difficulties and was dx with embolic CVA.   He was last seen in the office on 08/22/17 for follow up. Reported being followed by neurology and working on his speech therapy. Not working anymore 2/2 his impaired balance. Noted he is intolerant to statins and Zetia. He has been continued on long-term plavix given his recurrent ISR. Also on Eliquis for his PAF. He was referred to the lipid clinic for consideration of PCSK9s.   Reports being  in his usual state of health until last night. Around midnight he developed sudden onset of SScp with pain into the left arm. Noted his heart rate was elevated when he took it, but did not feel any palpitations. Presented to the ED with symptoms.   In the ED he was noted to be in Afib RVR. Labs showed K+ 3.2, Cr 0.8, Hgb 15.2, POC trop 0.01>>0.48. He was given IV lopressor and since converted to SB. No further chest pain since arrival.  Home Medications    Prior to Admission medications   Medication Sig Start Date End Date Taking? Authorizing Provider  amLODipine (NORVASC) 5 MG tablet TAKE 1 TABLET BY MOUTH EVERY DAY 08/19/17  Yes Martinique, Peter M, MD  clopidogrel (PLAVIX) 75 MG tablet TAKE 1 TABLET BY MOUTH DAILY 03/24/17  Yes Martinique,  Peter M, MD  ELIQUIS 5 MG TABS tablet TAKE 1 TABLET BY MOUTH TWICE A DAY 08/25/17  Yes Martinique, Peter M, MD  ketorolac (ACULAR) 0.4 % SOLN Place 1 drop into the left eye 4 (four) times daily.   Yes [provider]  metoprolol succinate (TOPROL-XL) 25 MG 24 hr tablet Take 3 tablets (75 mg total) by mouth daily. 03/28/17  Yes Martinique, Peter M, MD  Multiple Vitamins-Minerals (PRESERVISION AREDS) CAPS Take 1 capsule by mouth 2 (two) times daily.   Yes [provider]  NON FORMULARY as directed. tocotrienols   Yes [provider]  ofloxacin (OCUFLOX) 0.3 % ophthalmic solution Place 1 drop into the left eye 4 (four) times daily.   Yes [provider]  Piracetam POWD Take 2 scoop by mouth daily.    Yes [provider]  prednisoLONE acetate (PRED FORTE) 1 % ophthalmic suspension Place 1 drop into the left eye 4 (four) times daily.   Yes [provider]  Ubiquinol 100 MG CAPS Take 100 mg by mouth 2 (two) times daily.    Yes [provider]  VIGAMOX 0.5 % ophthalmic solution INSTILL ONE DROP INTO LEFT EYE 4 TIMES A DAY FOR 2 DAYS AFTER EACH MONTHLY EYE INJECTION 02/27/15  Yes [provider]  nitroGLYCERIN (NITROSTAT) 0.4 MG SL tablet PLACE ONE TABLET UNDER THE TONGUE EVERY 5 MINUTES AS NEEDED FOR CHEST PAIN 02/27/15   Darlin Coco, MD    Family History    Family History  Problem Relation Age of Onset  . Emphysema Mother   . Cancer Father     Social History    Social History   Socioeconomic History  . Marital status: Married    Spouse name: Not on file  . Number of children: 6  . Years of education: Not on file  . Highest education level: Some college, no degree  Social Needs  . Financial resource strain: Not on file  . Food insecurity - worry: Not on file  . Food insecurity - inability: Not on file  . Transportation needs - medical: Not on file  . Transportation needs - non-medical: Not on file  Occupational History    . Not on file  Tobacco Use  . Smoking status: Never Smoker  . Smokeless tobacco: Never Used  Substance and Sexual Activity  . Alcohol use: No  . Drug use: No  . Sexual activity: Not on file  Other Topics Concern  . Not on file  Social History Narrative   Lives at home with his wife   Right handed   Drinks no caffeine     Review of Systems  See HPI  All other systems reviewed and are otherwise negative except as noted above.  Physical Exam    Blood pressure 118/72, pulse (!) 49, temperature 98.2 F (36.8 C), temperature source Oral, resp. rate 16, height 5\' 10"  (1.778 m), weight 196 lb (88.9 kg), SpO2 99 %.  General: Pleasant, older WM, NAD Psych: Normal affect. Neuro: Alert and oriented X 3. Moves all extremities spontaneously. HEENT: Normal  Neck: Supple without bruits or JVD. Lungs:  Resp regular and unlabored, CTA. Heart: RRR no s3, s4, or murmurs. Abdomen: Soft, non-tender, non-distended, BS + x 4.  Extremities: No clubbing, cyanosis or edema. DP/PT/Radials 2+ and equal bilaterally.  Labs    Troponin [facility] of Care Test) Recent Labs    09/03/17 0648  TROPIPOC 0.48*   No results for input(s): CKTOTAL, CKMB, TROPONINI in the last 72 hours. Lab Results  Component Value Date   WBC 7.1 09/03/2017   HGB 15.2 09/03/2017   HCT 44.7 09/03/2017   MCV 85.6 09/03/2017   PLT 162 09/03/2017    Recent Labs  Lab 09/03/17 0336  NA 140  K 3.2*  CL 108  CO2 22  BUN 14  CREATININE 0.80  CALCIUM 8.8*  GLUCOSE 126*   Lab Results  Component Value Date   CHOL 186 11/27/2015   HDL 29 (L) 11/27/2015   LDLCALC 108 11/27/2015   TRIG 246 (H) 11/27/2015   No results found for: [facility]   Radiology Studies    Dg Chest 2 View  Result Date: 09/03/2017 CLINICAL DATA:  Chest pain. EXAM: CHEST - 2 VIEW COMPARISON:  03/09/2015 FINDINGS: Chronic elevation of right hemidiaphragm with adjacent atelectasis or scarring. Unchanged heart size and mediastinal contours. Coronary  stent visualized. Mild bronchial thickening. No confluent consolidation. No pleural effusion or pneumothorax. IMPRESSION: Chronic elevation of right hemidiaphragm with right lung base atelectasis or scarring. No acute abnormality. Electronically Signed   By: Jeb Levering M.D.   On: 09/03/2017 04:30    ECG & Cardiac Imaging    EKG: Afib RVR with conversion to SB.   Assessment & Plan    84 yo male with PMH of CAD s/p stent to the mRCA, LAD/D1/OM, HTN, HL, sleep apnea, and PAF who presented in Afib RVR with chest tightness.   1. Afib RVR: Developed sudden onset of chest pain with radiation into the left arm. Noted to be in Afib RVR on arrival to the ED. Given IV lopressor with improvement in rate and eventual conversion to SB. Has been on Eliquis as outpatient for [facility] as ChadsVasc at least 4.  -- will continue Eliquis for now as no plans for ischemic work up at this time. -- continue Toprol dose in the am, may need to reduce if remains bradycardiac   2. Elevated Trop: Suspect this could be demand ischemia in the setting of elevated HR. Known Hx of CAD with recurrent ISR. Long-term plavix. Last cath in 4235 developed embolic CVA. Not inclined to recath unless significant troponin elevation.  -- cycle troponins -- check echo  3. HTN: stable  4. HL: intolerant to statins. Planned to see PharmD in lipid clinic as outpatient.   5. Hypokalemia: 3.2, corrected in the ED.  -- BMET in the am   Severity of Illness: The appropriate patient status for this patient is OBSERVATION. Observation status is judged to be reasonable and necessary in order to provide the required intensity of service to ensure the patient's safety. The patient's presenting symptoms, physical exam findings,  and initial radiographic and laboratory data in the context of their medical condition is felt to place them at decreased risk for further clinical deterioration. Furthermore, it is anticipated that the patient will be  medically stable for discharge from the hospital within 2 midnights of admission. The following factors support the patient status of observation.   " The patient's presenting symptoms include chest pain, left arm pain. " The physical exam findings include initially elevated HR, now bradycardic. " The initial radiographic and laboratory data are elevated trop, hypokalemia.  [place] Pall, NP-C Pager [phone] 09/03/2017, 10:38 AM

## 2017-09-03 NOTE — ED Notes (Signed)
Attempted report and was asked to call back in 3 minutes.

## 2017-09-03 NOTE — ED Notes (Signed)
Spoke with Kerin Ransom, PA with cardiology- he will pass on for someone in his group to come eval pt. EDP and pt updated

## 2017-09-03 NOTE — ED Triage Notes (Addendum)
Per [facility], pt reports chest tightness starting around 0100 with radiating down the right arm. Pt reports tightness 2/10. Pt reports hx of 2 MI and stroke. Pt took 2 NTG at home. EMS gave 1 NTG and 324 ASA with no change. Pt received 500 mL of fluid via EMS. EMS reports R BBB on EKG. Pt heart rate in truck ranged from 120-170. Pt denies SOB, N/V, or diaphoresis.

## 2017-09-03 NOTE — ED Notes (Signed)
Pt given pillow and has no needs.

## 2017-09-03 NOTE — ED Notes (Signed)
Heart Healthy Diet was ordered for dinner.

## 2017-09-03 NOTE — ED Notes (Signed)
Cardiology at bedside.

## 2017-09-03 NOTE — ED Notes (Signed)
ED Provider at bedside. 

## 2017-09-03 NOTE — ED Notes (Signed)
Waiting for admitting team to come eval/dispo pt

## 2017-09-03 NOTE — ED Notes (Signed)
Dr. Martinique notified of elevated troponin

## 2017-09-03 NOTE — ED Notes (Addendum)
call wife, Izora Gala with updates  [phone].

## 2017-09-03 NOTE — ED Notes (Signed)
Dr Dina Rich informed of pt troponin .[place]

## 2017-09-04 ENCOUNTER — Encounter (HOSPITAL_COMMUNITY): Payer: Self-pay

## 2017-09-04 ENCOUNTER — Ambulatory Visit: Payer: Medicare Other

## 2017-09-04 ENCOUNTER — Observation Stay (HOSPITAL_BASED_OUTPATIENT_CLINIC_OR_DEPARTMENT_OTHER): Payer: Medicare Other

## 2017-09-04 ENCOUNTER — Other Ambulatory Visit: Payer: Self-pay

## 2017-09-04 DIAGNOSIS — R072 Precordial pain: Secondary | ICD-10-CM | POA: Diagnosis not present

## 2017-09-04 DIAGNOSIS — I214 Non-ST elevation (NSTEMI) myocardial infarction: Principal | ICD-10-CM

## 2017-09-04 DIAGNOSIS — I4891 Unspecified atrial fibrillation: Secondary | ICD-10-CM | POA: Diagnosis not present

## 2017-09-04 DIAGNOSIS — I25110 Atherosclerotic heart disease of native coronary artery with unstable angina pectoris: Secondary | ICD-10-CM | POA: Diagnosis not present

## 2017-09-04 LAB — BASIC METABOLIC PANEL
ANION GAP: 10 (ref 5–15)
BUN: 17 mg/dL (ref 6–20)
CALCIUM: 9 mg/dL (ref 8.9–10.3)
CO2: 18 mmol/L — ABNORMAL LOW (ref 22–32)
CREATININE: 0.99 mg/dL (ref 0.61–1.24)
Chloride: 115 mmol/L — ABNORMAL HIGH (ref 101–111)
Glucose, Bld: 85 mg/dL (ref 65–99)
Potassium: 4.3 mmol/L (ref 3.5–5.1)
SODIUM: 143 mmol/L (ref 135–145)

## 2017-09-04 LAB — TROPONIN I: TROPONIN I: 0.83 ng/mL — AB (ref <0.03)

## 2017-09-04 LAB — TSH: TSH: 1.882 u[IU]/mL (ref 0.350–4.500)

## 2017-09-04 LAB — ECHOCARDIOGRAM COMPLETE
HEIGHTINCHES: 70 in
Weight: 3110.4 oz

## 2017-09-04 NOTE — Progress Notes (Signed)
  Echocardiogram 2D Echocardiogram has been performed.  Dominic [name] 09/04/2017, 9:37 AM

## 2017-09-04 NOTE — Progress Notes (Signed)
Pt resting comfortably in room. Wife at bedside. No needs at the moment. Will continue to monitor.

## 2017-09-04 NOTE — Plan of Care (Signed)
  Clinical Measurements: Ability to maintain clinical measurements within normal limits will improve 09/04/2017 0015 - Progressing by Anshika Pethtel, Roma Kayser, RN   Clinical Measurements: Diagnostic test results will improve 09/04/2017 0015 - Progressing by Santasia Rew, Roma Kayser, RN   Clinical Measurements: Cardiovascular complication will be avoided 09/04/2017 0015 - Progressing by Verla Bryngelson, Roma Kayser, RN

## 2017-09-04 NOTE — Progress Notes (Signed)
Progress Note  Patient Name: Dominic [name] Date of Encounter: 09/04/2017  Primary Cardiologist: Peter Martinique MD   Subjective   Feels fine. No recurrent chest pain. No dyspnea or palpitations.  Inpatient Medications    Scheduled Meds: . amLODipine  5 mg Oral Daily  . apixaban  5 mg Oral BID  . clopidogrel  75 mg Oral Daily  . metoprolol succinate  75 mg Oral Daily   Continuous Infusions:  PRN Meds: acetaminophen, ondansetron (ZOFRAN) IV   Vital Signs    Vitals:   09/03/17 2015 09/04/17 0018 09/04/17 0504 09/04/17 0828  BP: (!) 150/74 (!) 155/73 (!) 150/71 (!) 158/59  Pulse: 66 67 72 74  Resp: 18 18 18    Temp: 97.7 F (36.5 C) 98.4 F (36.9 C) 98.5 F (36.9 C)   TempSrc: Oral Oral Oral   SpO2: 97% 97% 95% 96%  Weight:   194 lb 6.4 oz (88.2 kg)   Height:        Intake/Output Summary (Last 24 hours) at 09/04/2017 1006 Last data filed at 09/04/2017 0846 Gross per 24 hour  Intake 360 ml  Output 775 ml  Net -415 ml   Filed Weights   09/03/17 0324 09/04/17 0504  Weight: 196 lb (88.9 kg) 194 lb 6.4 oz (88.2 kg)    Telemetry    NSR - Personally Reviewed  ECG    None today   Physical Exam   GEN: No acute distress.   Neck: No JVD Cardiac: RRR, no murmurs, rubs, or gallops.  Respiratory: Clear to auscultation bilaterally. GI: Soft, nontender, non-distended  MS: No edema; No deformity. Neuro:  Nonfocal  Psych: Normal affect   Labs    Chemistry Recent Labs  Lab 09/03/17 0336 09/04/17 0114  NA 140 143  K 3.2* 4.3  CL 108 115*  CO2 22 18*  GLUCOSE 126* 85  BUN 14 17  CREATININE 0.80 0.99  CALCIUM 8.8* 9.0  GFRNONAA >60 >60  GFRAA >60 >60  ANIONGAP 10 10     Hematology Recent Labs  Lab 09/03/17 0336  WBC 7.1  RBC 5.22  HGB 15.2  HCT 44.7  MCV 85.6  MCH 29.1  MCHC 34.0  RDW 13.3  PLT 162    Cardiac Enzymes Recent Labs  Lab 09/03/17 1540 09/03/17 2037 09/04/17 0114  TROPONINI 1.79* 1.42* 0.83*    Recent Labs  Lab  09/03/17 0340 09/03/17 0648  TROPIPOC 0.01 0.48*     BNPNo results for input(s): BNP, PROBNP in the last 168 hours.   DDimer No results for input(s): DDIMER in the last 168 hours.   Radiology    Dg Chest 2 View  Result Date: 09/03/2017 CLINICAL DATA:  Chest pain. EXAM: CHEST - 2 VIEW COMPARISON:  03/09/2015 FINDINGS: Chronic elevation of right hemidiaphragm with adjacent atelectasis or scarring. Unchanged heart size and mediastinal contours. Coronary stent visualized. Mild bronchial thickening. No confluent consolidation. No pleural effusion or pneumothorax. IMPRESSION: Chronic elevation of right hemidiaphragm with right lung base atelectasis or scarring. No acute abnormality. Electronically Signed   By: Jeb Levering M.D.   On: 09/03/2017 04:30    Cardiac Studies   Echo pending  Patient Profile     84 y.o. male with PMH of CAD s/p stent to the mRCA, LAD/D1/OM, HTN, HL, sleep apnea, and PAF who presented in Afib RVR with chest tightness.     Assessment & Plan    1. Afib with RVR. Converted spontaneously to NSR. No recurrence since  admit. Pulse 60-65. Could consider increase in Toprol dose but will monitor for now. On Eliquis for anticoagulation.  2. NSTEMI type 2 with demand ischemia due to #1. No recurrent chest pain. Patient does have significant history of CAD with prior stents. On chronic plavix. Due to history of CVA following last intervention I would not favor invasive evaluation unless he has refractory angina on medical therapy. 3. HTN. Elevated. Awaiting am meds.  4. HLD. Was scheduled to see Pharm D today as outpatient. This will need to be rescheduled. Intolerant to statins.  5. Hypokalemia resolved.   For questions or updates, please contact [place] Please consult [email or web address] for contact info under Cardiology/STEMI.      Signed, Peter Martinique, MD  09/04/2017, 10:06 AM

## 2017-09-04 NOTE — Progress Notes (Signed)
   09/04/17 1400  Clinical Encounter Type  Visited With Patient and family together  Visit Type Initial  Referral From Nurse  Consult/Referral To Chaplain  Spiritual Encounters  Spiritual Needs Literature   Followed up on a SCC for HCPA.  Patient was sitting up in bed with his wife at bedside. Patient did want the information.  I reviewed the form with patient and his wife.  Answered questions for them.  Family wants to talk over about Healthcare agent part and then they will contact us to complete it.  Spent some time talking about their family and the hospital stay.  Will follow and support as needed. Chaplain Katherene Ponto

## 2017-09-05 ENCOUNTER — Ambulatory Visit: Payer: Medicare Other

## 2017-09-05 ENCOUNTER — Telehealth: Payer: Self-pay | Admitting: Neurology

## 2017-09-05 ENCOUNTER — Telehealth: Payer: Self-pay | Admitting: Cardiology

## 2017-09-05 DIAGNOSIS — I248 Other forms of acute ischemic heart disease: Secondary | ICD-10-CM | POA: Diagnosis not present

## 2017-09-05 DIAGNOSIS — I214 Non-ST elevation (NSTEMI) myocardial infarction: Secondary | ICD-10-CM | POA: Diagnosis not present

## 2017-09-05 DIAGNOSIS — I4891 Unspecified atrial fibrillation: Secondary | ICD-10-CM | POA: Diagnosis not present

## 2017-09-05 MED ORDER — ACETAMINOPHEN 325 MG PO TABS: 650.0000 mg | ORAL_TABLET | Freq: Four times a day (QID) | ORAL | Status: DC | PRN

## 2017-09-05 NOTE — Progress Notes (Addendum)
Pt got discharged to home, discharge instructions provided and patient showed understanding to it, IV taken out,Telemonitor DC,pt left unit in wheelchair with all of the belongings accompanied with a family member (wife), Rolling walker provided on leaving.  Palma Holter, RN

## 2017-09-05 NOTE — Progress Notes (Signed)
Rolling walker ordered as requested. B Lathaniel Legate RN,MHA,BSN [phone] 

## 2017-09-05 NOTE — Care Management Obs Status (Signed)
[place] NOTIFICATION   Patient Details  Name: TRENDON ZARING MRN: [id number] Date of Birth: 1933/10/29   Medicare Observation Status Notification Given:  Yes    Carles Collet, RN 09/05/2017, 10:04 AM

## 2017-09-05 NOTE — Evaluation (Signed)
Physical Therapy Evaluation Patient Details Name: Dominic [name] MRN: [id number] DOB: 02-11-1934 Today's Date: 09/05/2017   History of Present Illness  Pt admitted with afib with rvr. Pt also thought to have small demand ischemia NSTEMI.  PMH - CAD, HTN, afib, CVA, hip fx  Clinical Impression  Pt presents with slightly unsteady gait that improved with use of rolling walker. Pt has received OPPT multiple times and wife/pt will set this up again with pt's primary care physician. Pt instructed in home ex's including repeated sit to stands and balance activities (single leg stance, narrow base of support) with support of countertop.     Follow Up Recommendations Outpatient PT(Pt will set up with primary care physician)    Equipment Recommendations  Rolling walker with 5" wheels    Recommendations for Other Services       Precautions / Restrictions Precautions Precautions: Fall Restrictions Weight Bearing Restrictions: No      Mobility  Bed Mobility               General bed mobility comments: Pt up in chair  Transfers Overall transfer level: Modified independent Equipment used: None             General transfer comment: Incr effort  Ambulation/Gait Ambulation/Gait assistance: Supervision Ambulation Distance (Feet): 300 Feet Assistive device: None;Rolling walker (2 wheeled) Gait Pattern/deviations: Step-through pattern;Decreased stride length;Wide base of support;Drifts right/left;Trunk flexed Gait velocity: decr Gait velocity interpretation: Below normal speed for age/gender General Gait Details: Pt with slightly unsteady gait without loss of balance. Pt with incr stability and safety when using walker. Pt constantly looking down at feet and needed frequent cues to look up.   Stairs            Wheelchair Mobility    Modified Rankin (Stroke Patients Only)       Balance Overall balance assessment: Needs assistance Sitting-balance support: No upper  extremity supported;Feet supported Sitting balance-Leahy Scale: Good     Standing balance support: No upper extremity supported;During functional activity Standing balance-Leahy Scale: Fair                               Pertinent Vitals/Pain Pain Assessment: No/denies pain    Home Living Family/patient expects to be discharged to:: Private residence Living Arrangements: Spouse/significant other Available Help at Discharge: Family Type of Home: House Home Access: Level entry     Home Layout: One level Home Equipment: Cane - single point      Prior Function Level of Independence: Independent         Comments: reports some falls in the last 6 months     Hand Dominance   Dominant Hand: Right    Extremity/Trunk Assessment   Upper Extremity Assessment Upper Extremity Assessment: Overall WFL for tasks assessed    Lower Extremity Assessment Lower Extremity Assessment: Overall WFL for tasks assessed       Communication   Communication: No difficulties  Cognition Arousal/Alertness: Awake/alert Behavior During Therapy: WFL for tasks assessed/performed Overall Cognitive Status: History of cognitive impairments - at baseline                                 General Comments: Wife reports mild cognitive impairment      General Comments      Exercises     Assessment/Plan    PT Assessment All further  PT needs can be met in the next venue of care  PT Problem List Decreased balance;Decreased mobility       PT Treatment Interventions      PT Goals (Current goals can be found in the Care Plan section)  Acute Rehab PT Goals PT Goal Formulation: All assessment and education complete, DC therapy    Frequency     Barriers to discharge        Co-evaluation               AM-PAC PT "6 Clicks" Daily Activity  Outcome Measure Difficulty turning over in bed (including adjusting bedclothes, sheets and blankets)?: None Difficulty  moving from lying on back to sitting on the side of the bed? : None Difficulty sitting down on and standing up from a chair with arms (e.g., wheelchair, bedside commode, etc,.)?: A Little Help needed moving to and from a bed to chair (including a wheelchair)?: None Help needed walking in hospital room?: None Help needed climbing 3-5 steps with a railing? : A Little 6 Click Score: 22    End of Session Equipment Utilized During Treatment: Gait belt Activity Tolerance: Patient tolerated treatment well Patient left: in chair;with call bell/phone within reach;with family/visitor present Nurse Communication: Mobility status PT Visit Diagnosis: Unsteadiness on feet (R26.81);History of falling (Z91.81)    Time: 0349-1791 PT Time Calculation (min) (ACUTE ONLY): 18 min   Charges:   PT Evaluation $PT Eval Low Complexity: 1 Low     PT G CodesMarland Kitchen        [facility] PT [place] 09/05/2017, 1:39 PM

## 2017-09-05 NOTE — Discharge Instructions (Addendum)
° °Atrial Fibrillation °Atrial fibrillation is a type of heartbeat that is irregular or fast (rapid). If you have this condition, your heart keeps quivering in a weird (chaotic) way. This condition can make it so your heart cannot pump blood normally. Having this condition gives a person more risk for stroke, heart failure, and other heart problems. There are different types of atrial fibrillation. Talk with your doctor to learn about the type that you have. °Follow these instructions at home: °· Take over-the-counter and prescription medicines only as told by your doctor. °· If your doctor prescribed a blood-thinning medicine, take it exactly as told. Taking too much of it can cause bleeding. If you do not take enough of it, you will not have the protection that you need against stroke and other problems. °· Do not use any tobacco products. These include cigarettes, chewing tobacco, and e-cigarettes. If you need help quitting, ask your doctor. °· If you have apnea (obstructive sleep apnea), manage it as told by your doctor. °· Do not drink alcohol. °· Do not drink beverages that have caffeine. These include coffee, soda, and tea. °· Maintain a healthy weight. Do not use diet pills unless your doctor says they are safe for you. Diet pills may make heart problems worse. °· Follow diet instructions as told by your doctor. °· Exercise regularly as told by your doctor. °· Keep all follow-up visits as told by your doctor. This is important. °Contact a doctor if: °· You notice a change in the speed, rhythm, or strength of your heartbeat. °· You are taking a blood-thinning medicine and you notice more bruising. °· You get tired more easily when you move or exercise. °Get help right away if: °· You have pain in your chest or your belly (abdomen). °· You have sweating or weakness. °· You feel sick to your stomach (nauseous). °· You notice blood in your throw up (vomit), poop (stool), or pee (urine). °· You are short of  breath. °· You suddenly have swollen feet and ankles. °· You feel dizzy. °· Your suddenly get weak or numb in your face, arms, or legs, especially if it happens on one side of your body. °· You have trouble talking, trouble understanding, or both. °· Your face or your eyelid droops on one side. °These symptoms may be an emergency. Do not wait to see if the symptoms will go away. Get medical help right away. Call your local emergency services (911 in the [place] Do not drive yourself to the hospital. °This information is not intended to replace advice given to you by your health care provider. Make sure you discuss any questions you have with your health care provider. °Document Released: 03/12/2008 Document Revised: 11/09/2015 Document Reviewed: 09/28/2014 °Elsevier Interactive Patient Education © 2018 [employer] Inc[employer] °Information on my medicine - ELIQUIS® (apixaban) ° °Why was Eliquis® prescribed for you? °Eliquis® was prescribed for you to reduce the risk of forming blood clots that can cause a stroke if you have a medical condition called atrial fibrillation (a type of irregular heartbeat) OR to reduce the risk of a blood clots forming after orthopedic surgery. ° °What do You need to know about Eliquis® ? °Take your Eliquis® TWICE DAILY - one tablet in the morning and one tablet in the evening with or without food.  It would be best to take the doses about the same time each day. ° °If you have difficulty swallowing the tablet whole please discuss with your pharmacist how to   take the medication safely. ° °Take Eliquis® exactly as prescribed by your doctor and DO NOT stop taking Eliquis® without talking to the doctor who prescribed the medication.  Stopping may increase your risk of developing a new clot or stroke.  Refill your prescription before you run out. ° °After discharge, you should have regular check-up appointments with your healthcare provider that is prescribing your Eliquis®.  In the future your dose may  need to be changed if your kidney function or weight changes by a significant amount or as you get older. ° °What do you do if you miss a dose? °If you miss a dose, take it as soon as you remember on the same day and resume taking twice daily.  Do not take more than one dose of ELIQUIS at the same time. ° °Important Safety Information °A possible side effect of Eliquis® is bleeding. You should call your healthcare provider right away if you experience any of the following: °? Bleeding from an injury or your nose that does not stop. °? Unusual colored urine (red or dark brown) or unusual colored stools (red or black). °? Unusual bruising for unknown reasons. °? A serious fall or if you hit your head (even if there is no bleeding). ° °Some medicines may interact with Eliquis® and might increase your risk of bleeding or clotting while on Eliquis®. To help avoid this, consult your healthcare provider or pharmacist prior to using any new prescription or non-prescription medications, including herbals, vitamins, non-steroidal anti-inflammatory drugs (NSAIDs) and supplements. ° °This website has more information on Eliquis® (apixaban): www.Eliquis.com. ° ° ° °

## 2017-09-05 NOTE — Discharge Summary (Signed)
Discharge Summary    Patient ID: Dominic [name],  MRN: [id number], DOB/AGE: 1933/07/23 84 y.o.  Admit date: 09/03/2017 Discharge date: 09/05/2017  Primary Care Provider: Lawerance Cruel Primary Cardiologist:   Discharge Diagnoses    Principal Problem:   NSTEMI (non-ST elevated myocardial infarction) [facility]) Active Problems:   CAD S/P percutaneous coronary angioplasty   Atrial fibrillation with RVR (HCC)   Hypertension   PAF (paroxysmal atrial fibrillation) (HCC)   Chronic anticoagulation   Hyperlipidemia   RBBB (right bundle branch block)   OSA (obstructive sleep apnea)   History of embolic stroke   Allergies Allergies  Allergen Reactions  . Statins Other (See Comments)    Myalgia  . Zetia [Ezetimibe] Other (See Comments)    myalgia    Diagnostic Studies/Procedures    Echo 09/04/17 _____________   History of Present Illness     84 y/o with known CAD and PAF, admitted through the ED 09/03/17 with chest pain and AF with RVR.   Hospital Course      84 y/o male followed by Dr Martinique with a history of known CAD s/p prior PCIs. His last PCI in Sept 1497 was complicated by embolic stroke. The pt has know PAF as well and is on chronic Eliquis. He presented to the ED 09/03/17 with chest pain and was found to be in AF with RVR. He converted to NSR after IV Lopressor. He did bump his Troponin to 1.79 and Dr Martinique feels he had a small demand ischemia NSTEMI. The pt's echo revealed normal LVF. Dr Martinique felt conservative Rx was indicated. The pt asked for a PT consult prior to discharge-? Home walker. He'll be discharged later today with plans to f/u as an OP in 2 weeks.  _____________  Discharge Vitals Blood pressure 116/66, pulse (!) 54, temperature (!) 97.5 F (36.4 C), temperature source Oral, resp. rate 20, height 5\' 10"  (1.778 m), weight 194 lb 11.2 oz (88.3 kg), SpO2 97 %.  Filed Weights   09/03/17 0324 09/04/17 0504 09/05/17 0551  Weight: 196 lb (88.9 kg) 194 lb 6.4  oz (88.2 kg) 194 lb 11.2 oz (88.3 kg)    Labs & Radiologic Studies    CBC Recent Labs    09/03/17 0336  WBC 7.1  HGB 15.2  HCT 44.7  MCV 85.6  PLT 026   Basic Metabolic Panel Recent Labs    09/03/17 0336 09/04/17 0114  NA 140 143  K 3.2* 4.3  CL 108 115*  CO2 22 18*  GLUCOSE 126* 85  BUN 14 17  CREATININE 0.80 0.99  CALCIUM 8.8* 9.0   Liver Function Tests No results for input(s): AST, ALT, ALKPHOS, BILITOT, PROT, ALBUMIN in the last 72 hours. No results for input(s): LIPASE, AMYLASE in the last 72 hours. Cardiac Enzymes Recent Labs    09/03/17 1540 09/03/17 2037 09/04/17 0114  TROPONINI 1.79* 1.42* 0.83*   BNP Invalid input(s): POCBNP D-Dimer No results for input(s): DDIMER in the last 72 hours. Hemoglobin A1C No results for input(s): HGBA1C in the last 72 hours. Fasting Lipid Panel No results for input(s): CHOL, HDL, LDLCALC, TRIG, CHOLHDL, LDLDIRECT in the last 72 hours. Thyroid Function Tests Recent Labs    09/04/17 0114  TSH 1.882   _____________  Dg Chest 2 View  Result Date: 09/03/2017 CLINICAL DATA:  Chest pain. EXAM: CHEST - 2 VIEW COMPARISON:  03/09/2015 FINDINGS: Chronic elevation of right hemidiaphragm with adjacent atelectasis or scarring. Unchanged heart size and  mediastinal contours. Coronary stent visualized. Mild bronchial thickening. No confluent consolidation. No pleural effusion or pneumothorax. IMPRESSION: Chronic elevation of right hemidiaphragm with right lung base atelectasis or scarring. No acute abnormality. Electronically Signed   By: Jeb Levering M.D.   On: 09/03/2017 04:30   Disposition   Pt is being discharged home today in good condition.  Follow-up Plans & Appointments    Follow-up Information    Martinique, Peter M, MD Follow up.   Specialty:  Cardiology Why:  office will conatct you for follow up Contact information: [address] [place] [address] [phone]            Discharge  Medications   Allergies as of 09/05/2017      Reactions   Statins Other (See Comments)   Myalgia   Zetia [ezetimibe] Other (See Comments)   myalgia      Medication List    TAKE these medications   acetaminophen 325 MG tablet Commonly known as:  TYLENOL Take 2 tablets (650 mg total) by mouth every 6 (six) hours as needed for mild pain or headache.   amLODipine 5 MG tablet Commonly known as:  NORVASC TAKE 1 TABLET BY MOUTH EVERY DAY   clopidogrel 75 MG tablet Commonly known as:  PLAVIX TAKE 1 TABLET BY MOUTH DAILY   ELIQUIS 5 MG Tabs tablet Generic drug:  apixaban TAKE 1 TABLET BY MOUTH TWICE A DAY   ketorolac 0.4 % Soln Commonly known as:  ACULAR Place 1 drop into the left eye 4 (four) times daily.   metoprolol succinate 25 MG 24 hr tablet Commonly known as:  TOPROL-XL Take 3 tablets (75 mg total) by mouth daily.   nitroGLYCERIN 0.4 MG SL tablet Commonly known as:  NITROSTAT PLACE ONE TABLET UNDER THE TONGUE EVERY 5 MINUTES AS NEEDED FOR CHEST PAIN   NON FORMULARY as directed. tocotrienols   ofloxacin 0.3 % ophthalmic solution Commonly known as:  OCUFLOX Place 1 drop into the left eye 4 (four) times daily.   Piracetam Powd Take 2 scoop by mouth daily.   prednisoLONE acetate 1 % ophthalmic suspension Commonly known as:  PRED FORTE Place 1 drop into the left eye 4 (four) times daily.   PRESERVISION AREDS Caps Take 1 capsule by mouth 2 (two) times daily.   Ubiquinol 100 MG Caps Take 100 mg by mouth 2 (two) times daily.   VIGAMOX 0.5 % ophthalmic solution Generic drug:  moxifloxacin INSTILL ONE DROP INTO LEFT EYE 4 TIMES A DAY FOR 2 DAYS AFTER EACH MONTHLY EYE INJECTION         Outstanding Labs/Studies    Duration of Discharge Encounter   Greater than 30 minutes including physician time.  Angelena Form PA 09/05/2017, 12:14 PM

## 2017-09-05 NOTE — Telephone Encounter (Signed)
Closed encounter °

## 2017-09-05 NOTE — Telephone Encounter (Signed)
Pts wife called stating the pt is currently at [facility] and will call back to r/s B12 injection

## 2017-09-05 NOTE — Telephone Encounter (Signed)
FYI- Pt was scheduled today for B12 inj. Will call back to r/s appt once discharged from the hospital

## 2017-09-05 NOTE — Plan of Care (Signed)
  Problem: Education: Goal: Knowledge of General Education information will improve Outcome: Progressing   Problem: Health Behavior/Discharge Planning: Goal: Ability to manage health-related needs will improve Outcome: Progressing   Problem: Clinical Measurements: Goal: Ability to maintain clinical measurements within normal limits will improve Outcome: Progressing Goal: Will remain free from infection Outcome: Progressing Goal: Diagnostic test results will improve Outcome: Progressing Goal: Respiratory complications will improve Outcome: Progressing Goal: Cardiovascular complication will be avoided Outcome: Progressing   Problem: Clinical Measurements: Goal: Ability to maintain clinical measurements within normal limits will improve Outcome: Progressing Goal: Will remain free from infection Outcome: Progressing Goal: Diagnostic test results will improve Outcome: Progressing Goal: Respiratory complications will improve Outcome: Progressing Goal: Cardiovascular complication will be avoided Outcome: Progressing   Problem: Activity: Goal: Risk for activity intolerance will decrease Outcome: Progressing Note:  Very unsteady unchanged from baseline   Problem: Nutrition: Goal: Adequate nutrition will be maintained Outcome: Progressing   Problem: Coping: Goal: Level of anxiety will decrease Outcome: Progressing   Problem: Elimination: Goal: Will not experience complications related to bowel motility Outcome: Progressing Note:  Had an incontinence episode Goal: Will not experience complications related to urinary retention Outcome: Progressing   Problem: Pain Managment: Goal: General experience of comfort will improve Outcome: Progressing   Problem: Safety: Goal: Ability to remain free from injury will improve Outcome: Progressing   Problem: Skin Integrity: Goal: Risk for impaired skin integrity will decrease Outcome: Progressing   Problem: Spiritual  Needs Goal: Ability to function at adequate level Outcome: Progressing

## 2017-09-05 NOTE — Progress Notes (Signed)
Progress Note  Patient Name: Dominic [name] Date of Encounter: 09/05/2017  Primary Cardiologist: Alyx Mcguirk [place] MD   Subjective   Feels fine. No recurrent chest pain. No dyspnea or palpitations.  Inpatient Medications    Scheduled Meds: . amLODipine  5 mg Oral Daily  . apixaban  5 mg Oral BID  . clopidogrel  75 mg Oral Daily  . metoprolol succinate  75 mg Oral Daily   Continuous Infusions:  PRN Meds: acetaminophen, ondansetron (ZOFRAN) IV   Vital Signs    Vitals:   09/04/17 2124 09/05/17 0551 09/05/17 0800 09/05/17 0823  BP: (!) 144/73 130/68 (!) 145/69   Pulse: 70 (!) 59 (!) 55 62  Resp: 18  (!) 22   Temp: 97.6 F (36.4 C) 98.2 F (36.8 C) 98 F (36.7 C)   TempSrc: Oral Oral Oral   SpO2: 97% 96% 97%   Weight:  194 lb 11.2 oz (88.3 kg)    Height:        Intake/Output Summary (Last 24 hours) at 09/05/2017 0948 Last data filed at 09/05/2017 0837 Gross per 24 hour  Intake 600 ml  Output 1750 ml  Net -1150 ml   Filed Weights   09/03/17 0324 09/04/17 0504 09/05/17 0551  Weight: 196 lb (88.9 kg) 194 lb 6.4 oz (88.2 kg) 194 lb 11.2 oz (88.3 kg)    Telemetry    NSR - Personally Reviewed  ECG    None today   Physical Exam   GENERAL:  Well appearing WM in NAD HEENT:  PERRL, EOMI, sclera are clear. Oropharynx is clear. NECK:  No jugular venous distention, carotid upstroke brisk and symmetric, no bruits, no thyromegaly or adenopathy LUNGS:  Clear to auscultation bilaterally CHEST:  Unremarkable HEART:  RRR,  PMI not displaced or sustained,S1 and S2 within normal limits, no S3, no S4: no clicks, no rubs, no murmurs ABD:  Soft, nontender. BS +, no masses or bruits. No hepatomegaly, no splenomegaly EXT:  2 + pulses throughout, no edema, no cyanosis no clubbing SKIN:  Warm and dry.  No rashes NEURO:  Alert and oriented x 3. Cranial nerves II through XII intact. PSYCH:  Cognitively intact    Labs    Chemistry Recent Labs  Lab 09/03/17 0336 09/04/17 0114   NA 140 143  K 3.2* 4.3  CL 108 115*  CO2 22 18*  GLUCOSE 126* 85  BUN 14 17  CREATININE 0.80 0.99  CALCIUM 8.8* 9.0  GFRNONAA >60 >60  GFRAA >60 >60  ANIONGAP 10 10     Hematology Recent Labs  Lab 09/03/17 0336  WBC 7.1  RBC 5.22  HGB 15.2  HCT 44.7  MCV 85.6  MCH 29.1  MCHC 34.0  RDW 13.3  PLT 162    Cardiac Enzymes Recent Labs  Lab 09/03/17 1540 09/03/17 2037 09/04/17 0114  TROPONINI 1.79* 1.42* 0.83*    Recent Labs  Lab 09/03/17 0340 09/03/17 0648  TROPIPOC 0.01 0.48*     BNPNo results for input(s): BNP, PROBNP in the last 168 hours.   DDimer No results for input(s): DDIMER in the last 168 hours.   Radiology    No results found.  Cardiac Studies   Echo pending  Patient Profile     Dominic [name] with PMH of CAD s/p stent to the mRCA, LAD/D1/OM, HTN, HL, sleep apnea, and PAF who presented in Afib RVR with chest tightness.     Assessment & Plan    1. Afib with  RVR. Converted spontaneously to NSR. No recurrence since admit. Pulse 55-60.  On Eliquis for anticoagulation.  2. NSTEMI type 2 with demand ischemia due to #1. No recurrent chest pain. Patient does have significant history of CAD with prior stents. On chronic plavix. Due to history of CVA following last intervention I would not favor invasive evaluation unless he has refractory angina on medical therapy. 3. HTN. Improved today  4. HLD. Was scheduled to see Pharm D today as outpatient. This will need to be rescheduled. Intolerant to statins.  5. Hypokalemia resolved.   Patient is stable for DC today. Continue current meds.  For questions or updates, please contact [place] Please consult [email or web address] for contact info under Cardiology/STEMI.      Signed, [name], MD  09/05/2017, 9:48 AM

## 2017-09-08 ENCOUNTER — Telehealth: Payer: Self-pay | Admitting: Neurology

## 2017-09-08 NOTE — Telephone Encounter (Signed)
FYI Pt wife called to r/s Nurse visit for pt. She agreed to 03-26 @ 2:00 she was advised pt would need to check in by 1:45  No call back requested

## 2017-09-09 ENCOUNTER — Telehealth: Payer: Self-pay | Admitting: *Deleted

## 2017-09-09 ENCOUNTER — Ambulatory Visit (INDEPENDENT_AMBULATORY_CARE_PROVIDER_SITE_OTHER): Payer: Medicare Other | Admitting: *Deleted

## 2017-09-09 DIAGNOSIS — E538 Deficiency of other specified B group vitamins: Secondary | ICD-10-CM

## 2017-09-09 MED ORDER — CYANOCOBALAMIN 1000 MCG/ML IJ SOLN
1000.0000 ug | Freq: Once | INTRAMUSCULAR | Status: AC
  Administered 2017-09-09: 1000 ug via INTRAMUSCULAR

## 2017-09-09 NOTE — Telephone Encounter (Signed)
   [facility] Pre-operative Risk Assessment    Request for surgical clearance:  1. What type of surgery is being performed? CATARACT SURGERY   2. When is this surgery scheduled? 09/30/17   3. What type of clearance is required (medical clearance vs. Pharmacy clearance to hold med vs. Both)? MEDICAL  4. Are there any medications that need to be held prior to surgery and how long?NO NEED TO D/C ANY BLOOD THINNING AGENTS FOR THIS PROCEDURE   5. Practice name and name of physician performing surgery? [facility]    6. What is your office phone and fax number? PHONE [phone] FAX [phone] [place]   7. Anesthesia type (None, local, MAC, general) ? LOCAL

## 2017-09-09 NOTE — Telephone Encounter (Signed)
   Primary Cardiologist: Peter Martinique, MD  Cataract surgery is a low risk procedure. Chart reviewed as part of pre-operative protocol coverage. Given past medical history and time since last visit, based on ACC/AHA guidelines, Dominic [name] would be at acceptable risk for the planned procedure without further cardiovascular testing.   I will route this recommendation to the requesting party via Epic fax function and remove from pre-op pool.  Please call with questions.  Daune Perch, NP 09/09/2017, 4:50 PM

## 2017-09-09 NOTE — Progress Notes (Signed)
Pt given Cyanocobalamin 1000 mg IM injection in L deltoid. Pt tolerated well. Bandaid applied. See MAR.

## 2017-09-11 ENCOUNTER — Ambulatory Visit: Payer: Medicare Other | Admitting: Speech Pathology

## 2017-09-11 DIAGNOSIS — R471 Dysarthria and anarthria: Secondary | ICD-10-CM | POA: Diagnosis not present

## 2017-09-11 DIAGNOSIS — R41841 Cognitive communication deficit: Secondary | ICD-10-CM | POA: Diagnosis not present
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
## 2017-09-11 NOTE — Patient Instructions (Signed)
Practice reading the sheets given by your therapist out loud for 20 minutes, twice a day. Remember to use the strategies we practiced in therapy:  S   Slow L   Loud O  Overarticulate P   Pause

## 2017-09-18 ENCOUNTER — Ambulatory Visit: Payer: Medicare Other | Attending: Neurology | Admitting: Speech Pathology

## 2017-09-18 DIAGNOSIS — R29898 Other symptoms and signs involving the musculoskeletal system: Secondary | ICD-10-CM | POA: Insufficient documentation

## 2017-09-18 DIAGNOSIS — M6281 Muscle weakness (generalized): Secondary | ICD-10-CM | POA: Insufficient documentation

## 2017-09-18 DIAGNOSIS — R296 Repeated falls: Secondary | ICD-10-CM | POA: Diagnosis not present

## 2017-09-18 DIAGNOSIS — R262 Difficulty in walking, not elsewhere classified: Secondary | ICD-10-CM | POA: Diagnosis not present

## 2017-09-18 DIAGNOSIS — R41841 Cognitive communication deficit: Secondary | ICD-10-CM | POA: Insufficient documentation

## 2017-09-18 DIAGNOSIS — R471 Dysarthria and anarthria: Secondary | ICD-10-CM | POA: Diagnosis not present
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
## 2017-09-19 ENCOUNTER — Ambulatory Visit: Payer: Medicare Other

## 2017-09-19 DIAGNOSIS — M6281 Muscle weakness (generalized): Secondary | ICD-10-CM | POA: Diagnosis not present

## 2017-09-19 DIAGNOSIS — R296 Repeated falls: Secondary | ICD-10-CM | POA: Diagnosis not present

## 2017-09-19 DIAGNOSIS — R471 Dysarthria and anarthria: Secondary | ICD-10-CM | POA: Diagnosis not present

## 2017-09-19 DIAGNOSIS — R41841 Cognitive communication deficit: Secondary | ICD-10-CM

## 2017-09-19 DIAGNOSIS — R262 Difficulty in walking, not elsewhere classified: Secondary | ICD-10-CM | POA: Diagnosis not present

## 2017-09-19 DIAGNOSIS — R29898 Other symptoms and signs involving the musculoskeletal system: Secondary | ICD-10-CM | POA: Diagnosis not present

## 2017-09-19 NOTE — Patient Instructions (Signed)
  Please complete the assigned speech therapy homework prior to your next session and return it to the speech therapist at your next visit.  
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
## 2017-09-22 ENCOUNTER — Ambulatory Visit: Payer: Medicare Other | Admitting: Speech Pathology

## 2017-09-22 DIAGNOSIS — R471 Dysarthria and anarthria: Secondary | ICD-10-CM | POA: Diagnosis not present

## 2017-09-22 DIAGNOSIS — M6281 Muscle weakness (generalized): Secondary | ICD-10-CM | POA: Diagnosis not present

## 2017-09-22 DIAGNOSIS — H2511 Age-related nuclear cataract, right eye: Secondary | ICD-10-CM | POA: Diagnosis not present

## 2017-09-22 DIAGNOSIS — R296 Repeated falls: Secondary | ICD-10-CM | POA: Diagnosis not present

## 2017-09-22 DIAGNOSIS — R29898 Other symptoms and signs involving the musculoskeletal system: Secondary | ICD-10-CM | POA: Diagnosis not present

## 2017-09-22 DIAGNOSIS — R262 Difficulty in walking, not elsewhere classified: Secondary | ICD-10-CM | POA: Diagnosis not present

## 2017-09-22 DIAGNOSIS — R41841 Cognitive communication deficit: Secondary | ICD-10-CM | POA: Diagnosis not present

## 2017-09-22 DIAGNOSIS — H25011 Cortical age-related cataract, right eye: Secondary | ICD-10-CM | POA: Diagnosis not present
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
## 2017-09-25 ENCOUNTER — Ambulatory Visit: Payer: Medicare Other | Admitting: Speech Pathology

## 2017-09-25 DIAGNOSIS — R41841 Cognitive communication deficit: Secondary | ICD-10-CM | POA: Diagnosis not present

## 2017-09-25 DIAGNOSIS — R262 Difficulty in walking, not elsewhere classified: Secondary | ICD-10-CM | POA: Diagnosis not present

## 2017-09-25 DIAGNOSIS — R296 Repeated falls: Secondary | ICD-10-CM | POA: Diagnosis not present

## 2017-09-25 DIAGNOSIS — R29898 Other symptoms and signs involving the musculoskeletal system: Secondary | ICD-10-CM | POA: Diagnosis not present

## 2017-09-25 DIAGNOSIS — R471 Dysarthria and anarthria: Secondary | ICD-10-CM

## 2017-09-25 DIAGNOSIS — M6281 Muscle weakness (generalized): Secondary | ICD-10-CM | POA: Diagnosis not present
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
## 2017-09-29 ENCOUNTER — Encounter: Payer: Self-pay | Admitting: Physical Therapy

## 2017-09-29 ENCOUNTER — Ambulatory Visit: Payer: Medicare Other | Admitting: Physical Therapy

## 2017-09-29 DIAGNOSIS — M6281 Muscle weakness (generalized): Secondary | ICD-10-CM | POA: Diagnosis not present

## 2017-09-29 DIAGNOSIS — R29898 Other symptoms and signs involving the musculoskeletal system: Secondary | ICD-10-CM | POA: Diagnosis not present

## 2017-09-29 DIAGNOSIS — R41841 Cognitive communication deficit: Secondary | ICD-10-CM | POA: Diagnosis not present

## 2017-09-29 DIAGNOSIS — R262 Difficulty in walking, not elsewhere classified: Secondary | ICD-10-CM | POA: Diagnosis not present

## 2017-09-29 DIAGNOSIS — R296 Repeated falls: Secondary | ICD-10-CM | POA: Diagnosis not present

## 2017-09-29 DIAGNOSIS — R471 Dysarthria and anarthria: Secondary | ICD-10-CM | POA: Diagnosis not present
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
## 2017-09-30 ENCOUNTER — Ambulatory Visit: Payer: Medicare Other | Admitting: Physician Assistant

## 2017-09-30 DIAGNOSIS — H25811 Combined forms of age-related cataract, right eye: Secondary | ICD-10-CM | POA: Diagnosis not present

## 2017-09-30 DIAGNOSIS — H2511 Age-related nuclear cataract, right eye: Secondary | ICD-10-CM | POA: Diagnosis not present

## 2017-10-01 ENCOUNTER — Ambulatory Visit: Payer: Medicare Other | Admitting: Speech Pathology

## 2017-10-01 DIAGNOSIS — R471 Dysarthria and anarthria: Secondary | ICD-10-CM | POA: Diagnosis not present

## 2017-10-01 DIAGNOSIS — R41841 Cognitive communication deficit: Secondary | ICD-10-CM | POA: Diagnosis not present

## 2017-10-01 DIAGNOSIS — R296 Repeated falls: Secondary | ICD-10-CM | POA: Diagnosis not present

## 2017-10-01 DIAGNOSIS — R29898 Other symptoms and signs involving the musculoskeletal system: Secondary | ICD-10-CM | POA: Diagnosis not present

## 2017-10-01 DIAGNOSIS — R262 Difficulty in walking, not elsewhere classified: Secondary | ICD-10-CM | POA: Diagnosis not present

## 2017-10-01 DIAGNOSIS — M6281 Muscle weakness (generalized): Secondary | ICD-10-CM | POA: Diagnosis not present
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
## 2017-10-02 ENCOUNTER — Ambulatory Visit: Payer: Medicare Other | Admitting: Speech Pathology

## 2017-10-02 DIAGNOSIS — R296 Repeated falls: Secondary | ICD-10-CM | POA: Diagnosis not present

## 2017-10-02 DIAGNOSIS — R471 Dysarthria and anarthria: Secondary | ICD-10-CM | POA: Diagnosis not present

## 2017-10-02 DIAGNOSIS — M6281 Muscle weakness (generalized): Secondary | ICD-10-CM | POA: Diagnosis not present

## 2017-10-02 DIAGNOSIS — R41841 Cognitive communication deficit: Secondary | ICD-10-CM | POA: Diagnosis not present

## 2017-10-02 DIAGNOSIS — R29898 Other symptoms and signs involving the musculoskeletal system: Secondary | ICD-10-CM | POA: Diagnosis not present

## 2017-10-02 DIAGNOSIS — R262 Difficulty in walking, not elsewhere classified: Secondary | ICD-10-CM | POA: Diagnosis not present

## 2017-10-02 NOTE — Patient Instructions (Signed)
Use a visual reminder (write "slow down") on piece of paper to remind you to speak slowly when you're on the phone.   Continue practicing reading out loud, slowly and loudly, every day with your wife.  When you start to run out of air, stop talking (pause), breathe, and then continue.   If you notice yourself talking fast, stop and repeat your last sentence SLOWLY.   Come up with a signal that Dominic [name] can give you when she notices you talking too quickly. If you see her give that signal, go back and repeat yourself SLOWLY.
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
## 2017-10-06 ENCOUNTER — Ambulatory Visit (INDEPENDENT_AMBULATORY_CARE_PROVIDER_SITE_OTHER): Payer: Medicare Other | Admitting: Neurology

## 2017-10-06 DIAGNOSIS — R0683 Snoring: Secondary | ICD-10-CM

## 2017-10-06 DIAGNOSIS — G4720 Circadian rhythm sleep disorder, unspecified type: Secondary | ICD-10-CM

## 2017-10-06 DIAGNOSIS — G4733 Obstructive sleep apnea (adult) (pediatric): Secondary | ICD-10-CM

## 2017-10-06 DIAGNOSIS — G479 Sleep disorder, unspecified: Secondary | ICD-10-CM

## 2017-10-06 DIAGNOSIS — I480 Paroxysmal atrial fibrillation: Secondary | ICD-10-CM

## 2017-10-06 DIAGNOSIS — G4719 Other hypersomnia: Secondary | ICD-10-CM

## 2017-10-06 DIAGNOSIS — G3184 Mild cognitive impairment, so stated: Secondary | ICD-10-CM

## 2017-10-06 DIAGNOSIS — G4761 Periodic limb movement disorder: Secondary | ICD-10-CM

## 2017-10-06 HISTORY — PX: CATARACT EXTRACTION: SUR2

## 2017-10-07 ENCOUNTER — Ambulatory Visit: Payer: Medicare Other | Admitting: Physician Assistant

## 2017-10-07 NOTE — Progress Notes (Deleted)
Cardiology Office Note    Date:  10/07/2017   ID:  [name], [name] Jun 07, 1934, MRN [id number]  PCP:  Dominic Cruel, MD  Cardiologist:  Dr. Martinique   No chief complaint on file.   History of Present Illness:  Dominic [name] is a 84 y.o. male with PMH of CAD, PAF, HTN, HLD intolerant to statins, OSA and RBBB.  He has been diagnosed with PAF since June 2015 and has been controlled on Eliquis.  He had a prior history of BMS to RCA and a subsequent DES to RCA in March 2006.  Cardiac catheterization in December 2006 showed significant RCA in-stent restenosis treated with another DES.  Last cardiac catheterization on 03/09/2015 showed in-stent restenosis of RCA stent and this was treated with cutting balloon angioplasty, he also underwent PCI DES to OM 2 lesion.  There was residual LAD and D1 bifurcation lesion and a staged PCI was done on 03/13/2015.  Following the procedure, patient developed ataxia, speech difficulty and was diagnosed with embolic CVA.  He is not on aspirin to the need for Eliquis.  Patient was last seen by Dr. Martinique on 08/22/2017, she was doing well at the time maintaining normal sinus rhythm.  She was admitted from 3/20-2/22 with paroxysmal atrial fibrillation with RVR along with chest tightness.  Troponin was mildly elevated which was felt to be demand ischemia.  Medical therapy was recommended.  Patient converted spontaneously to normal sinus rhythm.  Echocardiogram obtained on 09/04/2017 showed EF 60-65%, trivial aortic regurgitation, mildly dilated left atrium.  He will need outpatient visit with clinical pharmacist to discuss PCSK 9 inhibitor.   No EKG unless HR > 100   Past Medical History:  Diagnosis Date  . Amputated finger    a. L index d/t to dog bite.  . Atrial fibrillation with RVR ([place])    a. New onset diagnosed 11/20/2013, spont converted to NSR.  Marland Kitchen Cataracts, bilateral   . Concussion   . Coronary artery disease 09/2004, 04/2005, 02/2015   a. Stent to prox  and mid RCA 08/2001. b. DES to RCA for ISR 08/2004. c. DES to [facility] for ISR 05/2005. d. Low risk nuc 10/2013 (done for CP in setting of new AF). e. PCI to the mid-RCA for in-stent restenosis with cutting balloon angioplasty f. PCI to an OM2 lesion  . Dyslipidemia    a. Intol of statins.  . Fracture acetabulum-closed ([place]) 2015  . Fractured pelvis ([place]) 2015  . Hypertension   . Lung nodule    , right upper lobe  . Macular degeneration    both eyes, receives shots in his eyes  . Obstructive sleep apnea   . OSA (obstructive sleep apnea)   . Prostate cancer ([place])   . RBBB (right bundle branch block)     Past Surgical History:  Procedure Laterality Date  . ANTERIOR CRUCIATE LIGAMENT REPAIR Right   . CARDIAC CATHETERIZATION N/A 03/10/2015   Procedure: Left Heart Cath and Coronary Angiography;  Surgeon: Dominic Harp, MD;  Location: [place] [facility];  Service: Cardiovascular;  Laterality: N/A;  . CARDIAC CATHETERIZATION  03/13/2015   Procedure: Coronary Stent Intervention;  Surgeon: Dominic M Martinique, MD;  Location: [place] [facility];  Service: Cardiovascular;;  . CARDIAC CATHETERIZATION  03/13/2015   Procedure: Intravascular Pressure Wire/FFR Study;  Surgeon: Dominic M Martinique, MD;  Location: [place] [facility];  Service: Cardiovascular;;  . CORONARY STENT PLACEMENT    . L knee ligament replacement    .  PROSTATECTOMY    . TONSILLECTOMY    . TOOTH EXTRACTION      Current Medications: Outpatient Medications Prior to Visit  Medication Sig Dispense Refill  . acetaminophen (TYLENOL) 325 MG tablet Take 2 tablets (650 mg total) by mouth every 6 (six) hours as needed for mild pain or headache.    Marland Kitchen amLODipine (NORVASC) 5 MG tablet TAKE 1 TABLET BY MOUTH EVERY DAY 30 tablet 5  . clopidogrel (PLAVIX) 75 MG tablet TAKE 1 TABLET BY MOUTH DAILY 90 tablet 3  . ELIQUIS 5 MG TABS tablet TAKE 1 TABLET BY MOUTH TWICE A DAY 180 tablet 1  . ketorolac (ACULAR) 0.4 % SOLN Place 1 drop into the left eye 4 (four)  times daily.    . metoprolol succinate (TOPROL-XL) 25 MG 24 hr tablet Take 3 tablets (75 mg total) by mouth daily. 270 tablet 1  . Multiple Vitamins-Minerals (PRESERVISION AREDS) CAPS Take 1 capsule by mouth 2 (two) times daily.    . nitroGLYCERIN (NITROSTAT) 0.4 MG SL tablet PLACE ONE TABLET UNDER THE TONGUE EVERY 5 MINUTES AS NEEDED FOR CHEST PAIN 25 tablet 3  . NON FORMULARY as directed. tocotrienols    . ofloxacin (OCUFLOX) 0.3 % ophthalmic solution Place 1 drop into the left eye 4 (four) times daily.    . Piracetam POWD Take 2 scoop by mouth daily.     . prednisoLONE acetate (PRED FORTE) 1 % ophthalmic suspension Place 1 drop into the left eye 4 (four) times daily.    Marland Kitchen Ubiquinol 100 MG CAPS Take 100 mg by mouth 2 (two) times daily.     Marland Kitchen VIGAMOX 0.5 % ophthalmic solution INSTILL ONE DROP INTO LEFT EYE 4 TIMES A DAY FOR 2 DAYS AFTER EACH MONTHLY EYE INJECTION  12   No facility-administered medications prior to visit.      Allergies:   Statins and Zetia [ezetimibe]   Social History   Socioeconomic History  . Marital status: Married    Spouse name: Not on file  . Number of children: 6  . Years of education: Not on file  . Highest education level: Some college, no degree  Occupational History  . Not on file  Social Needs  . Financial resource strain: Not on file  . Food insecurity:    Worry: Not on file    Inability: Not on file  . Transportation needs:    Medical: Not on file    Non-medical: Not on file  Tobacco Use  . Smoking status: Never Smoker  . Smokeless tobacco: Never Used  Substance and Sexual Activity  . Alcohol use: No  . Drug use: No  . Sexual activity: Not on file  Lifestyle  . Physical activity:    Days per week: Not on file    Minutes per session: Not on file  . Stress: Not on file  Relationships  . Social connections:    Talks on phone: Not on file    Gets together: Not on file    Attends religious service: Not on file    Active member of club or  organization: Not on file    Attends meetings of clubs or organizations: Not on file    Relationship status: Not on file  Other Topics Concern  . Not on file  Social History Narrative   Lives at home with his wife   Right handed   Drinks no caffeine     Family History:  The patient's ***family history includes Cancer in  his father; Emphysema in his mother.   ROS:   Please see the history of present illness.    ROS All other systems reviewed and are negative.   PHYSICAL EXAM:   VS:  There were no vitals taken for this visit.   GEN: Well nourished, well developed, in no acute distress  HEENT: normal  Neck: no JVD, carotid bruits, or masses Cardiac: ***RRR; no murmurs, rubs, or gallops,no edema  Respiratory:  clear to auscultation bilaterally, normal work of breathing GI: soft, nontender, nondistended, + BS MS: no deformity or atrophy  Skin: warm and dry, no rash Neuro:  Alert and Oriented x 3, Strength and sensation are intact Psych: euthymic mood, full affect  Wt Readings from Last 3 Encounters:  09/05/17 194 lb 11.2 oz (88.3 kg)  09/02/17 198 lb (89.8 kg)  08/22/17 196 lb (88.9 kg)      Studies/Labs Reviewed:   EKG:  EKG is*** ordered today.  The ekg ordered today demonstrates ***  Recent Labs: 09/03/2017: Hemoglobin 15.2; Platelets 162 09/04/2017: BUN 17; Creatinine, Ser 0.99; Potassium 4.3; Sodium 143; TSH 1.882   Lipid Panel    Component Value Date/Time   CHOL 186 11/27/2015 0919   TRIG 246 (H) 11/27/2015 0919   HDL 29 (L) 11/27/2015 0919   CHOLHDL 6.4 (H) 11/27/2015 0919   VLDL 49 (H) 11/27/2015 0919   LDLCALC 108 11/27/2015 0919   LDLDIRECT 146.0 12/28/2014 1057    Additional studies/ records that were reviewed today include:   Cath 03/10/2015 Conclusion    Mid RCA lesion, 80% stenosed.  Mid LAD lesion, 75% stenosed.  Ost 2nd Diag to 2nd Diag lesion, 99% stenosed.  2nd Mrg lesion, 95% stenosed. There is a 0% residual stenosis post  intervention.  There is a 0% residual stenosis post intervention.  The left ventricular systolic function is normal.   Cath 03/13/2015 Conclusion    Mid LAD lesion, 75% stenosed. There is a 0% residual stenosis post intervention.  A drug-eluting stent was placed.  Ost 2nd Diag to 2nd Diag lesion, 99% stenosed. There is a 30% residual stenosis post intervention.   Successful PCI of the mid LAD with a DES and Angiosculpt balloon angioplasty of the second diagonal.   Plan: DAPT for 3 months then consider stopping ASA. May resume Eliquis tomorrow if no complications.    Echo 09/04/2017 LV EF: 60% -   65% Study Conclusions  - Left ventricle: The cavity size was normal. Wall thickness was   increased in a pattern of mild LVH. Systolic function was normal.   The estimated ejection fraction was in the range of 60% to 65%. - Aortic valve: There was trivial regurgitation. Valve area (VTI):   3.09 cm^2. Valve area (Vmax): 3.51 cm^2. Valve area (Vmean): 3.77   cm^2. - Mitral valve: Calcified annulus. Mildly thickened leaflets . - Left atrium: The atrium was mildly dilated.   ASSESSMENT:    No diagnosis found.   PLAN:  In order of problems listed above:  1. ***    Medication Adjustments/Labs and Tests Ordered: Current medicines are reviewed at length with the patient today.  Concerns regarding medicines are outlined above.  Medication changes, Labs and Tests ordered today are listed in the Patient Instructions below. There are no Patient Instructions on file for this visit.   Hilbert Corrigan, [place]  10/07/2017 8:15 AM    [address], [place], [place]  [address] Phone: [phone]; Fax: [phone]

## 2017-10-08 ENCOUNTER — Ambulatory Visit: Payer: Medicare Other | Admitting: Physical Therapy

## 2017-10-08 ENCOUNTER — Encounter: Payer: Self-pay | Admitting: *Deleted

## 2017-10-08 ENCOUNTER — Encounter: Payer: Self-pay | Admitting: Physical Therapy

## 2017-10-08 DIAGNOSIS — R262 Difficulty in walking, not elsewhere classified: Secondary | ICD-10-CM | POA: Diagnosis not present

## 2017-10-08 DIAGNOSIS — R29898 Other symptoms and signs involving the musculoskeletal system: Secondary | ICD-10-CM

## 2017-10-08 DIAGNOSIS — R296 Repeated falls: Secondary | ICD-10-CM | POA: Diagnosis not present

## 2017-10-08 DIAGNOSIS — R471 Dysarthria and anarthria: Secondary | ICD-10-CM | POA: Diagnosis not present

## 2017-10-08 DIAGNOSIS — M6281 Muscle weakness (generalized): Secondary | ICD-10-CM

## 2017-10-08 DIAGNOSIS — R41841 Cognitive communication deficit: Secondary | ICD-10-CM | POA: Diagnosis not present
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
## 2017-10-09 ENCOUNTER — Telehealth: Payer: Self-pay

## 2017-10-09 NOTE — Telephone Encounter (Signed)
I called pt to discuss his sleep study results. No answer, left a message asking him to call me back. 

## 2017-10-09 NOTE — Procedures (Signed)
PATIENT'S NAME:  Dominic [name], Dominic [name] DOB:      [birth year]      MR#:    [id number]     DATE OF RECORDING: 10/06/2017 REFERRING M.D.: Dr. Sarina Ill,             PCP: Lawerance Cruel, MD Study Performed:   Baseline Polysomnogram HISTORY: 84 year old man with a history of coronary artery disease with status post stent placement, hyperlipidemia, hypertension, history of lung nodule, macular degeneration, parox. atrial fibrillation, bilateral cataracts, mild cognitive impairment, prostate cancer with status post prostatectomy, right bundle branch block, and overweight state, who was previously diagnosed with obstructive sleep apnea and placed on CPAP therapy. Patient is currently no longer using a CPAP machine. He did not feel that CPAP was helpful. The patient's weight 198 pounds with a height of 70 (inches), resulting in a BMI of 28.4 kg/m2. The patient's neck circumference measured 17 inches.  CURRENT MEDICATIONS: Amlodipine, Clipidogrel, Eliquis, Metoprolol Succinate, Multi-Vitamin, Nitroglycerin, Piracetam, Ubiquinol and Vigamox.   PROCEDURE:  This is a multichannel digital polysomnogram utilizing the Somnostar 11.2 system.  Electrodes and sensors were applied and monitored per AASM Specifications.   EEG, EOG, Chin and Limb EMG, were sampled at 200 Hz.  ECG, Snore and Nasal Pressure, Thermal Airflow, Respiratory Effort, CPAP Flow and Pressure, Oximetry was sampled at 50 Hz. Digital video and audio were recorded.      BASELINE STUDY  Lights Out was at 22:14 and Lights On at 05:00.  Total recording time (TRT) was 406 minutes, with a total sleep time (TST) of  330 minutes.   The patient's sleep latency was 13.5 minutes.  REM latency was 48.5 minutes.  The sleep efficiency was 81.3 %.     SLEEP ARCHITECTURE: WASO (Wake after sleep onset) was 61.5 minutes with mild sleep fragmentation noted.  There were 18 minutes in Stage N1, 265.5 minutes Stage N2, 0 minutes Stage N3 and 46.5 minutes in Stage REM.  The  percentage of Stage N1 was 5.5%, Stage N2 was 80.5%, which is markedly increased, Stage N3 was absent, and Stage R (REM sleep) was 14.1%, which is reduced. The arousals were noted as: 36 were spontaneous, 34 were associated with PLMs, 6 were associated with respiratory events.  RESPIRATORY ANALYSIS:  There were a total of 6 respiratory events:  0 obstructive apneas, 0 central apneas and 0 mixed apneas with a total of 0 apneas and an apnea index (AI) of 0 /hour. There were 6 hypopneas with a hypopnea index of 1.1 /hour. The patient also had 0 respiratory event related arousals (RERAs).      The total APNEA/HYPOPNEA INDEX (AHI) was 1.1/hour and the total RESPIRATORY DISTURBANCE INDEX was 0. 1.1 /hour.  3 events occurred in REM sleep and 6 events in NREM. The REM AHI was 3.9/hour, versus a non-REM AHI of .6. The patient spent 29.5 minutes of total sleep time in the supine position and 301 minutes in non-supine.. The supine AHI was 0.0 versus a non-supine AHI of 1.2.  OXYGEN SATURATION & C02:  The Wake baseline 02 saturation was 94%, with the lowest being 90%. Time spent below 89% saturation equaled 0 minutes.  PERIODIC LIMB MOVEMENTS: The patient had a total of 124 Periodic Limb Movements.  The Periodic Limb Movement (PLM) index was 22.5 and the PLM Arousal index was 6.2/hour.  Audio and video analysis did not show any abnormal or unusual movements, behaviors, phonations or vocalizations. The patient took no bathroom breaks. Mild intermittent  snoring was noted. EKG was in keeping with normal sinus rhythm (NSR).  Post-study, the patient indicated that sleep was the same as usual.   IMPRESSION:  1. Periodic Limb Movement Disorder (PLMD) 2. Primary Snoring 3. Dysfunction associated with sleep stages or arousal from sleep  RECOMMENDATIONS:  1. This study does not demonstrate any significant obstructive or central sleep disordered breathing with the exception of mild intermittent snoring.  2. Mild  PLMs (periodic limb movements of sleep) were noted during this study with minimal arousals; clinical correlation is recommended.  3. This study shows sleep fragmentation and abnormal sleep stage percentages; these are nonspecific findings and per se do not signify an intrinsic sleep disorder or a cause for the patient's sleep-related symptoms. Causes include (but are not limited to) the first night effect of the sleep study, circadian rhythm disturbances, medication effect or an underlying mood disorder or medical problem.  4. The patient should be cautioned not to drive, work at heights, or operate dangerous or heavy equipment when tired or sleepy. Review and reiteration of good sleep hygiene measures should be pursued with any patient. 5. The patient can follow-up with the referring provider, who will be notified of the test results.  I certify that I have reviewed the entire raw data recording prior to the issuance of this report in accordance with the Standards of Accreditation of the American Academy of Sleep Medicine (AASM)  Star Age, MD, PhD Diplomat, American Board of Psychiatry and Neurology (Neurology and Sleep Medicine)

## 2017-10-09 NOTE — Telephone Encounter (Signed)
Pt's wife Izora Gala, per DPR, returned my call. I advised her of pt's sleep study results. Pt's wife verbalized understanding and will inform pt of these results. Pt will follow up with Dr. Jaynee Eagles as planned. Pt's wife had no questions at this time but will call us back if questions or concerns arise.

## 2017-10-09 NOTE — Telephone Encounter (Signed)
-----   Message from Star Age, MD sent at 10/09/2017  8:30 AM EDT ----- Patient referred by Dr. Jaynee Eagles, seen by me on 09/02/17, diagnostic PSG on 10/06/17.   Please call and notify the patient that the recent sleep study did not show any significant obstructive sleep apnea; mild intermittent snoring was noted. Mild leg movements/twitching was noted, not very disruptive to sleep. Please inform patient that he can follow up with Dr. Jaynee Eagles as planned.   Thanks,  Star Age, MD, PhD [facility])

## 2017-10-09 NOTE — Progress Notes (Signed)
Patient referred by Dr. Jaynee Eagles, seen by me on 09/02/17, diagnostic PSG on 10/06/17.   Please call and notify the patient that the recent sleep study did not show any significant obstructive sleep apnea; mild intermittent snoring was noted. Mild leg movements/twitching was noted, not very disruptive to sleep. Please inform patient that he can follow up with Dr. Jaynee Eagles as planned.   Thanks,  Star Age, MD, PhD [facility])

## 2017-10-10 ENCOUNTER — Ambulatory Visit (INDEPENDENT_AMBULATORY_CARE_PROVIDER_SITE_OTHER): Payer: Medicare Other | Admitting: *Deleted

## 2017-10-10 ENCOUNTER — Encounter: Payer: Self-pay | Admitting: *Deleted

## 2017-10-10 DIAGNOSIS — E538 Deficiency of other specified B group vitamins: Secondary | ICD-10-CM | POA: Diagnosis not present

## 2017-10-10 MED ORDER — CYANOCOBALAMIN 1000 MCG/ML IJ SOLN
1000.0000 ug | Freq: Once | INTRAMUSCULAR | Status: AC
  Administered 2017-10-10: 1000 ug via INTRAMUSCULAR

## 2017-10-10 NOTE — Progress Notes (Signed)
Pt here for Vitamin B12 1000 mcg IM injection. Administered in R deltoid. See MAR. Pt tolerated well. No bleeding, bandaid was applied.

## 2017-10-13 ENCOUNTER — Encounter: Payer: Self-pay | Admitting: Physical Therapy

## 2017-10-13 ENCOUNTER — Ambulatory Visit: Payer: Medicare Other | Admitting: Physical Therapy

## 2017-10-13 DIAGNOSIS — R29898 Other symptoms and signs involving the musculoskeletal system: Secondary | ICD-10-CM

## 2017-10-13 DIAGNOSIS — R262 Difficulty in walking, not elsewhere classified: Secondary | ICD-10-CM

## 2017-10-13 DIAGNOSIS — M6281 Muscle weakness (generalized): Secondary | ICD-10-CM

## 2017-10-13 DIAGNOSIS — R296 Repeated falls: Secondary | ICD-10-CM | POA: Diagnosis not present

## 2017-10-13 DIAGNOSIS — R471 Dysarthria and anarthria: Secondary | ICD-10-CM | POA: Diagnosis not present

## 2017-10-13 DIAGNOSIS — R41841 Cognitive communication deficit: Secondary | ICD-10-CM | POA: Diagnosis not present
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
## 2017-10-15 ENCOUNTER — Ambulatory Visit: Payer: Medicare Other | Attending: Neurology | Admitting: Physical Therapy

## 2017-10-15 ENCOUNTER — Encounter: Payer: Self-pay | Admitting: Physical Therapy

## 2017-10-15 DIAGNOSIS — R29898 Other symptoms and signs involving the musculoskeletal system: Secondary | ICD-10-CM

## 2017-10-15 DIAGNOSIS — R262 Difficulty in walking, not elsewhere classified: Secondary | ICD-10-CM | POA: Insufficient documentation

## 2017-10-15 DIAGNOSIS — R296 Repeated falls: Secondary | ICD-10-CM | POA: Insufficient documentation

## 2017-10-15 DIAGNOSIS — M6281 Muscle weakness (generalized): Secondary | ICD-10-CM | POA: Insufficient documentation
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
## 2017-10-16 ENCOUNTER — Encounter (INDEPENDENT_AMBULATORY_CARE_PROVIDER_SITE_OTHER): Payer: Medicare Other | Admitting: Ophthalmology

## 2017-10-16 DIAGNOSIS — H353231 Exudative age-related macular degeneration, bilateral, with active choroidal neovascularization: Secondary | ICD-10-CM

## 2017-10-16 DIAGNOSIS — H35033 Hypertensive retinopathy, bilateral: Secondary | ICD-10-CM | POA: Diagnosis not present

## 2017-10-16 DIAGNOSIS — I10 Essential (primary) hypertension: Secondary | ICD-10-CM

## 2017-10-16 DIAGNOSIS — H43813 Vitreous degeneration, bilateral: Secondary | ICD-10-CM | POA: Diagnosis not present

## 2017-10-20 ENCOUNTER — Encounter: Payer: Self-pay | Admitting: Physical Therapy

## 2017-10-20 ENCOUNTER — Ambulatory Visit: Payer: Medicare Other | Admitting: Physical Therapy

## 2017-10-20 DIAGNOSIS — R296 Repeated falls: Secondary | ICD-10-CM

## 2017-10-20 DIAGNOSIS — M6281 Muscle weakness (generalized): Secondary | ICD-10-CM

## 2017-10-20 DIAGNOSIS — R29898 Other symptoms and signs involving the musculoskeletal system: Secondary | ICD-10-CM | POA: Diagnosis not present

## 2017-10-20 DIAGNOSIS — R262 Difficulty in walking, not elsewhere classified: Secondary | ICD-10-CM | POA: Diagnosis not present
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
## 2017-10-22 ENCOUNTER — Ambulatory Visit: Payer: Medicare Other | Admitting: Physical Therapy

## 2017-10-22 ENCOUNTER — Encounter: Payer: Self-pay | Admitting: Physical Therapy

## 2017-10-22 DIAGNOSIS — R29898 Other symptoms and signs involving the musculoskeletal system: Secondary | ICD-10-CM

## 2017-10-22 DIAGNOSIS — M6281 Muscle weakness (generalized): Secondary | ICD-10-CM | POA: Diagnosis not present

## 2017-10-22 DIAGNOSIS — R296 Repeated falls: Secondary | ICD-10-CM | POA: Diagnosis not present

## 2017-10-22 DIAGNOSIS — R262 Difficulty in walking, not elsewhere classified: Secondary | ICD-10-CM | POA: Diagnosis not present
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
## 2017-10-27 ENCOUNTER — Encounter: Payer: Self-pay | Admitting: Physical Therapy

## 2017-10-27 ENCOUNTER — Ambulatory Visit: Payer: Medicare Other | Admitting: Physical Therapy

## 2017-10-27 DIAGNOSIS — M6281 Muscle weakness (generalized): Secondary | ICD-10-CM

## 2017-10-27 DIAGNOSIS — R262 Difficulty in walking, not elsewhere classified: Secondary | ICD-10-CM

## 2017-10-27 DIAGNOSIS — R296 Repeated falls: Secondary | ICD-10-CM

## 2017-10-27 DIAGNOSIS — R29898 Other symptoms and signs involving the musculoskeletal system: Secondary | ICD-10-CM | POA: Diagnosis not present
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
## 2017-10-29 ENCOUNTER — Encounter: Payer: Self-pay | Admitting: Physical Therapy

## 2017-10-29 ENCOUNTER — Ambulatory Visit: Payer: Medicare Other | Admitting: Physical Therapy

## 2017-10-29 DIAGNOSIS — R262 Difficulty in walking, not elsewhere classified: Secondary | ICD-10-CM | POA: Diagnosis not present

## 2017-10-29 DIAGNOSIS — R296 Repeated falls: Secondary | ICD-10-CM

## 2017-10-29 DIAGNOSIS — M6281 Muscle weakness (generalized): Secondary | ICD-10-CM | POA: Diagnosis not present

## 2017-10-29 DIAGNOSIS — R29898 Other symptoms and signs involving the musculoskeletal system: Secondary | ICD-10-CM | POA: Diagnosis not present
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
## 2017-11-03 ENCOUNTER — Ambulatory Visit: Payer: Medicare Other | Admitting: Physical Therapy

## 2017-11-03 ENCOUNTER — Encounter: Payer: Self-pay | Admitting: Physical Therapy

## 2017-11-03 DIAGNOSIS — M6281 Muscle weakness (generalized): Secondary | ICD-10-CM | POA: Diagnosis not present

## 2017-11-03 DIAGNOSIS — R296 Repeated falls: Secondary | ICD-10-CM

## 2017-11-03 DIAGNOSIS — R262 Difficulty in walking, not elsewhere classified: Secondary | ICD-10-CM

## 2017-11-03 DIAGNOSIS — R29898 Other symptoms and signs involving the musculoskeletal system: Secondary | ICD-10-CM | POA: Diagnosis not present
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
## 2017-11-05 ENCOUNTER — Ambulatory Visit: Payer: Medicare Other | Admitting: Physical Therapy

## 2017-11-05 ENCOUNTER — Encounter: Payer: Self-pay | Admitting: Physical Therapy

## 2017-11-05 DIAGNOSIS — R262 Difficulty in walking, not elsewhere classified: Secondary | ICD-10-CM

## 2017-11-05 DIAGNOSIS — R29898 Other symptoms and signs involving the musculoskeletal system: Secondary | ICD-10-CM

## 2017-11-05 DIAGNOSIS — R296 Repeated falls: Secondary | ICD-10-CM | POA: Diagnosis not present

## 2017-11-05 DIAGNOSIS — M6281 Muscle weakness (generalized): Secondary | ICD-10-CM
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
## 2017-11-07 ENCOUNTER — Ambulatory Visit (INDEPENDENT_AMBULATORY_CARE_PROVIDER_SITE_OTHER): Payer: Medicare Other | Admitting: *Deleted

## 2017-11-07 DIAGNOSIS — E538 Deficiency of other specified B group vitamins: Secondary | ICD-10-CM | POA: Diagnosis not present

## 2017-11-07 MED ORDER — CYANOCOBALAMIN 1000 MCG/ML IJ SOLN
1000.0000 ug | Freq: Once | INTRAMUSCULAR | Status: AC
  Administered 2017-11-07: 1000 ug via INTRAMUSCULAR

## 2017-11-07 NOTE — Progress Notes (Signed)
Pt given Vitamin B12 1000 mcg IM injection. Tolerated well. bandaid applied. See MAR. He was given instructions to start taking B12 daily 1000 mcg oral and we will plan to recheck his labs in 3 months per Dr. Jaynee Eagles.

## 2017-11-07 NOTE — Patient Instructions (Signed)
Begin taking Vitamin B12 1000 mcg by mouth daily. You can purchase this over the counter. We will plan to recheck your B12 in about 3 months.

## 2017-11-12 ENCOUNTER — Ambulatory Visit: Payer: Medicare Other | Admitting: Physical Therapy

## 2017-11-12 ENCOUNTER — Encounter: Payer: Self-pay | Admitting: Physical Therapy

## 2017-11-12 DIAGNOSIS — R262 Difficulty in walking, not elsewhere classified: Secondary | ICD-10-CM

## 2017-11-12 DIAGNOSIS — R296 Repeated falls: Secondary | ICD-10-CM | POA: Diagnosis not present

## 2017-11-12 DIAGNOSIS — M6281 Muscle weakness (generalized): Secondary | ICD-10-CM

## 2017-11-12 DIAGNOSIS — R29898 Other symptoms and signs involving the musculoskeletal system: Secondary | ICD-10-CM | POA: Diagnosis not present
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
## 2017-11-17 ENCOUNTER — Ambulatory Visit: Payer: Medicare Other | Attending: Neurology | Admitting: Physical Therapy

## 2017-11-17 DIAGNOSIS — R296 Repeated falls: Secondary | ICD-10-CM | POA: Insufficient documentation

## 2017-11-17 DIAGNOSIS — R262 Difficulty in walking, not elsewhere classified: Secondary | ICD-10-CM | POA: Insufficient documentation

## 2017-11-17 DIAGNOSIS — M6281 Muscle weakness (generalized): Secondary | ICD-10-CM | POA: Insufficient documentation

## 2017-11-17 DIAGNOSIS — R29898 Other symptoms and signs involving the musculoskeletal system: Secondary | ICD-10-CM | POA: Insufficient documentation

## 2017-11-19 ENCOUNTER — Encounter: Payer: Self-pay | Admitting: Physical Therapy

## 2017-11-19 ENCOUNTER — Ambulatory Visit: Payer: Medicare Other | Admitting: Physical Therapy

## 2017-11-19 DIAGNOSIS — R29898 Other symptoms and signs involving the musculoskeletal system: Secondary | ICD-10-CM | POA: Diagnosis not present

## 2017-11-19 DIAGNOSIS — R296 Repeated falls: Secondary | ICD-10-CM

## 2017-11-19 DIAGNOSIS — R262 Difficulty in walking, not elsewhere classified: Secondary | ICD-10-CM | POA: Diagnosis not present

## 2017-11-19 DIAGNOSIS — M6281 Muscle weakness (generalized): Secondary | ICD-10-CM
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
## 2017-11-24 ENCOUNTER — Encounter: Payer: Self-pay | Admitting: Physical Therapy

## 2017-11-24 ENCOUNTER — Ambulatory Visit: Payer: Medicare Other | Admitting: Physical Therapy

## 2017-11-24 DIAGNOSIS — M6281 Muscle weakness (generalized): Secondary | ICD-10-CM | POA: Diagnosis not present

## 2017-11-24 DIAGNOSIS — R262 Difficulty in walking, not elsewhere classified: Secondary | ICD-10-CM | POA: Diagnosis not present

## 2017-11-24 DIAGNOSIS — R296 Repeated falls: Secondary | ICD-10-CM

## 2017-11-24 DIAGNOSIS — R29898 Other symptoms and signs involving the musculoskeletal system: Secondary | ICD-10-CM
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
## 2017-11-25 ENCOUNTER — Encounter: Payer: Self-pay | Admitting: Physical Therapy

## 2017-11-25 ENCOUNTER — Ambulatory Visit: Payer: Medicare Other | Admitting: Physical Therapy

## 2017-11-25 DIAGNOSIS — M6281 Muscle weakness (generalized): Secondary | ICD-10-CM

## 2017-11-25 DIAGNOSIS — R262 Difficulty in walking, not elsewhere classified: Secondary | ICD-10-CM | POA: Diagnosis not present

## 2017-11-25 DIAGNOSIS — R29898 Other symptoms and signs involving the musculoskeletal system: Secondary | ICD-10-CM

## 2017-11-25 DIAGNOSIS — R296 Repeated falls: Secondary | ICD-10-CM | POA: Diagnosis not present
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
## 2017-12-01 ENCOUNTER — Ambulatory Visit: Payer: Medicare Other | Admitting: Physical Therapy

## 2017-12-01 ENCOUNTER — Encounter: Payer: Self-pay | Admitting: Physical Therapy

## 2017-12-01 DIAGNOSIS — R296 Repeated falls: Secondary | ICD-10-CM

## 2017-12-01 DIAGNOSIS — R29898 Other symptoms and signs involving the musculoskeletal system: Secondary | ICD-10-CM | POA: Diagnosis not present

## 2017-12-01 DIAGNOSIS — M546 Pain in thoracic spine: Secondary | ICD-10-CM | POA: Diagnosis not present

## 2017-12-01 DIAGNOSIS — M6281 Muscle weakness (generalized): Secondary | ICD-10-CM

## 2017-12-01 DIAGNOSIS — R262 Difficulty in walking, not elsewhere classified: Secondary | ICD-10-CM | POA: Diagnosis not present

## 2017-12-01 NOTE — Addendum Note (Signed)
Addended by: Sumner Boast on: 12/01/2017 04:16 PM   Modules accepted: Orders
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
## 2017-12-03 ENCOUNTER — Ambulatory Visit: Payer: Medicare Other | Admitting: Physical Therapy

## 2017-12-03 ENCOUNTER — Encounter: Payer: Self-pay | Admitting: Physical Therapy

## 2017-12-03 DIAGNOSIS — R296 Repeated falls: Secondary | ICD-10-CM

## 2017-12-03 DIAGNOSIS — M6281 Muscle weakness (generalized): Secondary | ICD-10-CM

## 2017-12-03 DIAGNOSIS — R29898 Other symptoms and signs involving the musculoskeletal system: Secondary | ICD-10-CM | POA: Diagnosis not present

## 2017-12-03 DIAGNOSIS — R262 Difficulty in walking, not elsewhere classified: Secondary | ICD-10-CM | POA: Diagnosis not present
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
## 2017-12-11 ENCOUNTER — Encounter: Payer: Self-pay | Admitting: Physical Therapy

## 2017-12-11 ENCOUNTER — Ambulatory Visit: Payer: Medicare Other | Admitting: Physical Therapy

## 2017-12-11 DIAGNOSIS — R262 Difficulty in walking, not elsewhere classified: Secondary | ICD-10-CM

## 2017-12-11 DIAGNOSIS — M6281 Muscle weakness (generalized): Secondary | ICD-10-CM

## 2017-12-11 DIAGNOSIS — R296 Repeated falls: Secondary | ICD-10-CM

## 2017-12-11 DIAGNOSIS — R29898 Other symptoms and signs involving the musculoskeletal system: Secondary | ICD-10-CM
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
## 2017-12-15 ENCOUNTER — Ambulatory Visit: Payer: Medicare Other | Attending: Neurology | Admitting: Physical Therapy

## 2017-12-15 ENCOUNTER — Encounter: Payer: Self-pay | Admitting: Physical Therapy

## 2017-12-15 DIAGNOSIS — R262 Difficulty in walking, not elsewhere classified: Secondary | ICD-10-CM | POA: Diagnosis not present

## 2017-12-15 DIAGNOSIS — R41841 Cognitive communication deficit: Secondary | ICD-10-CM | POA: Insufficient documentation

## 2017-12-15 DIAGNOSIS — R471 Dysarthria and anarthria: Secondary | ICD-10-CM | POA: Diagnosis not present

## 2017-12-15 DIAGNOSIS — R2689 Other abnormalities of gait and mobility: Secondary | ICD-10-CM | POA: Diagnosis not present

## 2017-12-15 DIAGNOSIS — M6281 Muscle weakness (generalized): Secondary | ICD-10-CM | POA: Diagnosis not present

## 2017-12-15 DIAGNOSIS — R29898 Other symptoms and signs involving the musculoskeletal system: Secondary | ICD-10-CM

## 2017-12-15 DIAGNOSIS — R296 Repeated falls: Secondary | ICD-10-CM | POA: Diagnosis not present
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
## 2017-12-17 ENCOUNTER — Ambulatory Visit: Payer: Medicare Other | Admitting: Physical Therapy

## 2017-12-17 ENCOUNTER — Encounter: Payer: Self-pay | Admitting: Physical Therapy

## 2017-12-17 DIAGNOSIS — R262 Difficulty in walking, not elsewhere classified: Secondary | ICD-10-CM | POA: Diagnosis not present

## 2017-12-17 DIAGNOSIS — R296 Repeated falls: Secondary | ICD-10-CM

## 2017-12-17 DIAGNOSIS — M6281 Muscle weakness (generalized): Secondary | ICD-10-CM | POA: Diagnosis not present

## 2017-12-17 DIAGNOSIS — R29898 Other symptoms and signs involving the musculoskeletal system: Secondary | ICD-10-CM | POA: Diagnosis not present

## 2017-12-17 DIAGNOSIS — R471 Dysarthria and anarthria: Secondary | ICD-10-CM | POA: Diagnosis not present

## 2017-12-17 DIAGNOSIS — R41841 Cognitive communication deficit: Secondary | ICD-10-CM | POA: Diagnosis not present
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
## 2017-12-24 ENCOUNTER — Encounter: Payer: Self-pay | Admitting: Physical Therapy

## 2017-12-24 ENCOUNTER — Ambulatory Visit: Payer: Medicare Other | Admitting: Physical Therapy

## 2017-12-24 DIAGNOSIS — M6281 Muscle weakness (generalized): Secondary | ICD-10-CM

## 2017-12-24 DIAGNOSIS — R471 Dysarthria and anarthria: Secondary | ICD-10-CM

## 2017-12-24 DIAGNOSIS — R262 Difficulty in walking, not elsewhere classified: Secondary | ICD-10-CM | POA: Diagnosis not present

## 2017-12-24 DIAGNOSIS — R29898 Other symptoms and signs involving the musculoskeletal system: Secondary | ICD-10-CM

## 2017-12-24 DIAGNOSIS — R2689 Other abnormalities of gait and mobility: Secondary | ICD-10-CM

## 2017-12-24 DIAGNOSIS — R41841 Cognitive communication deficit: Secondary | ICD-10-CM

## 2017-12-24 DIAGNOSIS — R296 Repeated falls: Secondary | ICD-10-CM | POA: Diagnosis not present
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
## 2017-12-25 ENCOUNTER — Ambulatory Visit: Payer: Medicare Other | Admitting: Physical Therapy

## 2017-12-25 ENCOUNTER — Encounter: Payer: Self-pay | Admitting: Physical Therapy

## 2017-12-25 DIAGNOSIS — R471 Dysarthria and anarthria: Secondary | ICD-10-CM | POA: Diagnosis not present

## 2017-12-25 DIAGNOSIS — R29898 Other symptoms and signs involving the musculoskeletal system: Secondary | ICD-10-CM | POA: Diagnosis not present

## 2017-12-25 DIAGNOSIS — M6281 Muscle weakness (generalized): Secondary | ICD-10-CM

## 2017-12-25 DIAGNOSIS — R296 Repeated falls: Secondary | ICD-10-CM | POA: Diagnosis not present

## 2017-12-25 DIAGNOSIS — R262 Difficulty in walking, not elsewhere classified: Secondary | ICD-10-CM

## 2017-12-25 DIAGNOSIS — R41841 Cognitive communication deficit: Secondary | ICD-10-CM | POA: Diagnosis not present
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
## 2017-12-29 ENCOUNTER — Encounter: Payer: Self-pay | Admitting: Physical Therapy

## 2017-12-29 ENCOUNTER — Ambulatory Visit: Payer: Medicare Other | Admitting: Physical Therapy

## 2017-12-29 DIAGNOSIS — R471 Dysarthria and anarthria: Secondary | ICD-10-CM | POA: Diagnosis not present

## 2017-12-29 DIAGNOSIS — R296 Repeated falls: Secondary | ICD-10-CM

## 2017-12-29 DIAGNOSIS — M6281 Muscle weakness (generalized): Secondary | ICD-10-CM

## 2017-12-29 DIAGNOSIS — R262 Difficulty in walking, not elsewhere classified: Secondary | ICD-10-CM | POA: Diagnosis not present

## 2017-12-29 DIAGNOSIS — R2689 Other abnormalities of gait and mobility: Secondary | ICD-10-CM

## 2017-12-29 DIAGNOSIS — R41841 Cognitive communication deficit: Secondary | ICD-10-CM | POA: Diagnosis not present

## 2017-12-29 DIAGNOSIS — R29898 Other symptoms and signs involving the musculoskeletal system: Secondary | ICD-10-CM | POA: Diagnosis not present
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
## 2017-12-31 ENCOUNTER — Encounter: Payer: Self-pay | Admitting: Physical Therapy

## 2017-12-31 ENCOUNTER — Ambulatory Visit: Payer: Medicare Other | Admitting: Physical Therapy

## 2017-12-31 DIAGNOSIS — R29898 Other symptoms and signs involving the musculoskeletal system: Secondary | ICD-10-CM | POA: Diagnosis not present

## 2017-12-31 DIAGNOSIS — R296 Repeated falls: Secondary | ICD-10-CM | POA: Diagnosis not present

## 2017-12-31 DIAGNOSIS — M6281 Muscle weakness (generalized): Secondary | ICD-10-CM | POA: Diagnosis not present

## 2017-12-31 DIAGNOSIS — R471 Dysarthria and anarthria: Secondary | ICD-10-CM | POA: Diagnosis not present

## 2017-12-31 DIAGNOSIS — R262 Difficulty in walking, not elsewhere classified: Secondary | ICD-10-CM | POA: Diagnosis not present

## 2017-12-31 DIAGNOSIS — R41841 Cognitive communication deficit: Secondary | ICD-10-CM | POA: Diagnosis not present
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
## 2018-01-05 ENCOUNTER — Encounter: Payer: Medicare Other | Admitting: Physical Therapy

## 2018-01-07 ENCOUNTER — Ambulatory Visit: Payer: Medicare Other | Admitting: Physical Therapy

## 2018-01-08 ENCOUNTER — Encounter (INDEPENDENT_AMBULATORY_CARE_PROVIDER_SITE_OTHER): Payer: Medicare Other | Admitting: Ophthalmology

## 2018-01-08 DIAGNOSIS — H353231 Exudative age-related macular degeneration, bilateral, with active choroidal neovascularization: Secondary | ICD-10-CM

## 2018-01-08 DIAGNOSIS — H43813 Vitreous degeneration, bilateral: Secondary | ICD-10-CM | POA: Diagnosis not present

## 2018-01-08 DIAGNOSIS — H35033 Hypertensive retinopathy, bilateral: Secondary | ICD-10-CM | POA: Diagnosis not present

## 2018-01-08 DIAGNOSIS — I10 Essential (primary) hypertension: Secondary | ICD-10-CM

## 2018-01-09 ENCOUNTER — Ambulatory Visit: Payer: Medicare Other | Admitting: Physical Therapy

## 2018-02-03 ENCOUNTER — Other Ambulatory Visit: Payer: Self-pay | Admitting: *Deleted

## 2018-02-03 DIAGNOSIS — E538 Deficiency of other specified B group vitamins: Secondary | ICD-10-CM

## 2018-02-03 NOTE — Progress Notes (Signed)
Repeat Vit B12 and MMA labs ordered per v.o. Dr. Jaynee Eagles.

## 2018-02-04 ENCOUNTER — Telehealth: Payer: Self-pay | Admitting: *Deleted

## 2018-02-04 ENCOUNTER — Other Ambulatory Visit (INDEPENDENT_AMBULATORY_CARE_PROVIDER_SITE_OTHER): Payer: Self-pay

## 2018-02-04 DIAGNOSIS — E538 Deficiency of other specified B group vitamins: Secondary | ICD-10-CM

## 2018-02-04 DIAGNOSIS — Z0289 Encounter for other administrative examinations: Secondary | ICD-10-CM

## 2018-02-04 NOTE — Telephone Encounter (Signed)
Called pt and informed him that we would like to check his B12 & MMA again to see how his lab levels are. He verbalized understanding. Discussed he doesn't need an appointment, can come during office hours by 4:30 pm. He stated he would try to come today.

## 2018-02-05 ENCOUNTER — Telehealth: Payer: Self-pay | Admitting: *Deleted

## 2018-02-05 DIAGNOSIS — H401223 Low-tension glaucoma, left eye, severe stage: Secondary | ICD-10-CM | POA: Diagnosis not present

## 2018-02-05 DIAGNOSIS — H40053 Ocular hypertension, bilateral: Secondary | ICD-10-CM | POA: Diagnosis not present

## 2018-02-05 DIAGNOSIS — H401212 Low-tension glaucoma, right eye, moderate stage: Secondary | ICD-10-CM | POA: Diagnosis not present

## 2018-02-05 NOTE — Telephone Encounter (Addendum)
Called pt and LVM (ok per DPR) and informed him that his B12 level is now normal at 550. He will need to continue oral Vitamin B12 1000 mcg daily for life. He does not need anymore B12 injections right now. Left office number for patient to call back if he has any questions.    ----- Message from Melvenia Beam, MD sent at 02/05/2018 10:02 AM EDT ----- B12 is improved at 550. Continue Oral B12 for life. thanks

## 2018-02-07 LAB — METHYLMALONIC ACID, SERUM: Methylmalonic Acid: 188 nmol/L (ref 0–378)

## 2018-02-07 LAB — VITAMIN B12: VITAMIN B 12: 550 pg/mL (ref 232–1245)

## 2018-02-15 ENCOUNTER — Emergency Department (HOSPITAL_BASED_OUTPATIENT_CLINIC_OR_DEPARTMENT_OTHER)
Admission: EM | Admit: 2018-02-15 | Discharge: 2018-02-15 | Disposition: A | Discharge status: Home / Self Care | Payer: Medicare Other | Attending: Emergency Medicine | Admitting: Emergency Medicine

## 2018-02-15 ENCOUNTER — Emergency Department (HOSPITAL_BASED_OUTPATIENT_CLINIC_OR_DEPARTMENT_OTHER): Payer: Medicare Other

## 2018-02-15 ENCOUNTER — Other Ambulatory Visit: Payer: Self-pay

## 2018-02-15 ENCOUNTER — Other Ambulatory Visit: Payer: Self-pay | Admitting: Cardiology

## 2018-02-15 ENCOUNTER — Encounter (HOSPITAL_BASED_OUTPATIENT_CLINIC_OR_DEPARTMENT_OTHER): Payer: Self-pay | Admitting: Emergency Medicine

## 2018-02-15 DIAGNOSIS — Z7901 Long term (current) use of anticoagulants: Secondary | ICD-10-CM | POA: Diagnosis not present

## 2018-02-15 DIAGNOSIS — Y939 Activity, unspecified: Secondary | ICD-10-CM | POA: Insufficient documentation

## 2018-02-15 DIAGNOSIS — S61512A Laceration without foreign body of left wrist, initial encounter: Secondary | ICD-10-CM | POA: Diagnosis not present

## 2018-02-15 DIAGNOSIS — I2510 Atherosclerotic heart disease of native coronary artery without angina pectoris: Secondary | ICD-10-CM | POA: Insufficient documentation

## 2018-02-15 DIAGNOSIS — I10 Essential (primary) hypertension: Secondary | ICD-10-CM | POA: Insufficient documentation

## 2018-02-15 DIAGNOSIS — Y92009 Unspecified place in unspecified non-institutional (private) residence as the place of occurrence of the external cause: Secondary | ICD-10-CM | POA: Diagnosis not present

## 2018-02-15 DIAGNOSIS — R51 Headache: Secondary | ICD-10-CM | POA: Insufficient documentation

## 2018-02-15 DIAGNOSIS — S199XXA Unspecified injury of neck, initial encounter: Secondary | ICD-10-CM | POA: Diagnosis not present

## 2018-02-15 DIAGNOSIS — Z79899 Other long term (current) drug therapy: Secondary | ICD-10-CM | POA: Insufficient documentation

## 2018-02-15 DIAGNOSIS — M542 Cervicalgia: Secondary | ICD-10-CM | POA: Insufficient documentation

## 2018-02-15 DIAGNOSIS — W010XXA Fall on same level from slipping, tripping and stumbling without subsequent striking against object, initial encounter: Secondary | ICD-10-CM | POA: Insufficient documentation

## 2018-02-15 DIAGNOSIS — W19XXXA Unspecified fall, initial encounter: Secondary | ICD-10-CM

## 2018-02-15 DIAGNOSIS — Y999 Unspecified external cause status: Secondary | ICD-10-CM | POA: Diagnosis not present

## 2018-02-15 DIAGNOSIS — S0990XA Unspecified injury of head, initial encounter: Secondary | ICD-10-CM | POA: Diagnosis not present

## 2018-02-15 DIAGNOSIS — I4510 Unspecified right bundle-branch block: Secondary | ICD-10-CM | POA: Diagnosis not present

## 2018-02-15 LAB — CBC WITH DIFFERENTIAL/PLATELET
Basophils Absolute: 0 10*3/uL (ref 0.0–0.1)
Basophils Relative: 1 %
Eosinophils Absolute: 0.1 10*3/uL (ref 0.0–0.7)
Eosinophils Relative: 2 %
HCT: 44.5 % (ref 39.0–52.0)
Hemoglobin: 15.2 g/dL (ref 13.0–17.0)
Lymphocytes Relative: 14 %
Lymphs Abs: 0.9 10*3/uL (ref 0.7–4.0)
MCH: 28.8 pg (ref 26.0–34.0)
MCHC: 34.2 g/dL (ref 30.0–36.0)
MCV: 84.3 fL (ref 78.0–100.0)
Monocytes Absolute: 0.7 10*3/uL (ref 0.1–1.0)
Monocytes Relative: 10 %
Neutro Abs: 4.9 10*3/uL (ref 1.7–7.7)
Neutrophils Relative %: 73 %
Platelets: 158 10*3/uL (ref 150–400)
RBC: 5.28 MIL/uL (ref 4.22–5.81)
RDW: 13.8 % (ref 11.5–15.5)
WBC: 6.7 10*3/uL (ref 4.0–10.5)

## 2018-02-15 LAB — URINALYSIS, ROUTINE W REFLEX MICROSCOPIC
Bilirubin Urine: NEGATIVE
Glucose, UA: NEGATIVE mg/dL
Hgb urine dipstick: NEGATIVE
Ketones, ur: NEGATIVE mg/dL
Leukocytes, UA: NEGATIVE
Nitrite: NEGATIVE
Protein, ur: NEGATIVE mg/dL
Specific Gravity, Urine: 1.01 (ref 1.005–1.030)
pH: 7 (ref 5.0–8.0)

## 2018-02-15 LAB — BASIC METABOLIC PANEL
Anion gap: 9 (ref 5–15)
BUN: 14 mg/dL (ref 8–23)
CO2: 23 mmol/L (ref 22–32)
Calcium: 9 mg/dL (ref 8.9–10.3)
Chloride: 108 mmol/L (ref 98–111)
Creatinine, Ser: 0.94 mg/dL (ref 0.61–1.24)
GFR calc Af Amer: >60 mL/min (ref >60)
GFR calc non Af Amer: >60 mL/min (ref >60)
Glucose, Bld: 150 mg/dL — ABNORMAL HIGH (ref 70–99)
Potassium: 3.7 mmol/L (ref 3.5–5.1)
Sodium: 140 mmol/L (ref 135–145)

## 2018-02-15 NOTE — ED Notes (Signed)
ED Provider at bedside. 

## 2018-02-15 NOTE — ED Notes (Signed)
Ginger Ale given per MD.

## 2018-02-15 NOTE — Discharge Instructions (Signed)
You can take Tylenol as prescribed over-the-counter, as needed for your neck pain.  Use ice and heat alternating 20 minutes on, 20 minutes off.  Wash your wound daily with warm soapy water, apply antibiotic ointment, and clean dressing.  Please follow-up with your doctor this week for recheck and further evaluation of your recurrent falls.  Please return to the emergency department if you develop any new or worsening symptoms.

## 2018-02-15 NOTE — ED Notes (Addendum)
Pt still unable to void. Pt on cardiac monitor and auto VS.

## 2018-02-15 NOTE — ED Notes (Signed)
Pt d/c home with family.
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
## 2018-02-15 NOTE — ED Notes (Signed)
Water given per RN.

## 2018-02-15 NOTE — ED Triage Notes (Signed)
Fall - lost balance while getting up from chair and fell to the floor, hitting top of head. No bleeding.  Has slight swelling area. Is on Plavix and Eloquis.  Also abrasions to left hand and wrist.  No LOC. Sts he loses  His balance since his stroke.  Also had a fall last night.

## 2018-02-18 DIAGNOSIS — R2689 Other abnormalities of gait and mobility: Secondary | ICD-10-CM | POA: Diagnosis not present

## 2018-02-18 DIAGNOSIS — T565X1S Toxic effect of zinc and its compounds, accidental (unintentional), sequela: Secondary | ICD-10-CM | POA: Diagnosis not present

## 2018-02-18 DIAGNOSIS — D485 Neoplasm of uncertain behavior of skin: Secondary | ICD-10-CM | POA: Diagnosis not present

## 2018-02-18 DIAGNOSIS — E538 Deficiency of other specified B group vitamins: Secondary | ICD-10-CM | POA: Diagnosis not present

## 2018-02-24 DIAGNOSIS — T565X1S Toxic effect of zinc and its compounds, accidental (unintentional), sequela: Secondary | ICD-10-CM | POA: Diagnosis not present

## 2018-03-11 DIAGNOSIS — D485 Neoplasm of uncertain behavior of skin: Secondary | ICD-10-CM | POA: Diagnosis not present

## 2018-03-11 DIAGNOSIS — C4441 Basal cell carcinoma of skin of scalp and neck: Secondary | ICD-10-CM | POA: Diagnosis not present

## 2018-03-13 ENCOUNTER — Other Ambulatory Visit: Payer: Self-pay

## 2018-03-13 ENCOUNTER — Ambulatory Visit: Payer: Medicare Other | Attending: Neurology

## 2018-03-13 DIAGNOSIS — M6281 Muscle weakness (generalized): Secondary | ICD-10-CM | POA: Insufficient documentation

## 2018-03-13 DIAGNOSIS — R296 Repeated falls: Secondary | ICD-10-CM | POA: Diagnosis not present

## 2018-03-13 DIAGNOSIS — R208 Other disturbances of skin sensation: Secondary | ICD-10-CM | POA: Diagnosis not present

## 2018-03-13 DIAGNOSIS — R2689 Other abnormalities of gait and mobility: Secondary | ICD-10-CM | POA: Diagnosis not present

## 2018-03-13 DIAGNOSIS — R278 Other lack of coordination: Secondary | ICD-10-CM | POA: Insufficient documentation
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
## 2018-03-16 ENCOUNTER — Other Ambulatory Visit: Payer: Self-pay | Admitting: Cardiology

## 2018-03-18 ENCOUNTER — Ambulatory Visit: Payer: Medicare Other | Attending: Neurology | Admitting: Physical Therapy

## 2018-03-18 ENCOUNTER — Encounter: Payer: Self-pay | Admitting: Physical Therapy

## 2018-03-18 DIAGNOSIS — R208 Other disturbances of skin sensation: Secondary | ICD-10-CM | POA: Insufficient documentation

## 2018-03-18 DIAGNOSIS — R278 Other lack of coordination: Secondary | ICD-10-CM | POA: Diagnosis not present

## 2018-03-18 DIAGNOSIS — M6281 Muscle weakness (generalized): Secondary | ICD-10-CM | POA: Diagnosis not present

## 2018-03-18 DIAGNOSIS — R2689 Other abnormalities of gait and mobility: Secondary | ICD-10-CM

## 2018-03-18 DIAGNOSIS — R296 Repeated falls: Secondary | ICD-10-CM | POA: Diagnosis not present
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
## 2018-03-19 ENCOUNTER — Encounter (INDEPENDENT_AMBULATORY_CARE_PROVIDER_SITE_OTHER): Payer: Medicare Other | Admitting: Ophthalmology

## 2018-03-19 DIAGNOSIS — H35033 Hypertensive retinopathy, bilateral: Secondary | ICD-10-CM

## 2018-03-19 DIAGNOSIS — I10 Essential (primary) hypertension: Secondary | ICD-10-CM | POA: Diagnosis not present

## 2018-03-19 DIAGNOSIS — H43813 Vitreous degeneration, bilateral: Secondary | ICD-10-CM | POA: Diagnosis not present

## 2018-03-19 DIAGNOSIS — H353231 Exudative age-related macular degeneration, bilateral, with active choroidal neovascularization: Secondary | ICD-10-CM | POA: Diagnosis not present

## 2018-03-20 ENCOUNTER — Encounter: Payer: Self-pay | Admitting: Physical Therapy

## 2018-03-20 ENCOUNTER — Ambulatory Visit: Payer: Medicare Other | Admitting: Physical Therapy

## 2018-03-20 DIAGNOSIS — R278 Other lack of coordination: Secondary | ICD-10-CM | POA: Diagnosis not present

## 2018-03-20 DIAGNOSIS — M6281 Muscle weakness (generalized): Secondary | ICD-10-CM

## 2018-03-20 DIAGNOSIS — R296 Repeated falls: Secondary | ICD-10-CM

## 2018-03-20 DIAGNOSIS — R208 Other disturbances of skin sensation: Secondary | ICD-10-CM | POA: Diagnosis not present

## 2018-03-20 DIAGNOSIS — R2689 Other abnormalities of gait and mobility: Secondary | ICD-10-CM | POA: Diagnosis not present
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
## 2018-03-25 ENCOUNTER — Ambulatory Visit: Payer: Medicare Other | Admitting: Physical Therapy

## 2018-03-25 ENCOUNTER — Encounter: Payer: Self-pay | Admitting: Physical Therapy

## 2018-03-25 VITALS — BP 145/69 | HR 70

## 2018-03-25 DIAGNOSIS — R208 Other disturbances of skin sensation: Secondary | ICD-10-CM | POA: Diagnosis not present

## 2018-03-25 DIAGNOSIS — M6281 Muscle weakness (generalized): Secondary | ICD-10-CM | POA: Diagnosis not present

## 2018-03-25 DIAGNOSIS — R2689 Other abnormalities of gait and mobility: Secondary | ICD-10-CM | POA: Diagnosis not present

## 2018-03-25 DIAGNOSIS — R296 Repeated falls: Secondary | ICD-10-CM

## 2018-03-25 DIAGNOSIS — R278 Other lack of coordination: Secondary | ICD-10-CM
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
## 2018-03-27 ENCOUNTER — Ambulatory Visit: Payer: Medicare Other | Admitting: Physical Therapy

## 2018-03-27 ENCOUNTER — Encounter: Payer: Self-pay | Admitting: Physical Therapy

## 2018-03-27 VITALS — BP 148/65 | HR 67

## 2018-03-27 DIAGNOSIS — R296 Repeated falls: Secondary | ICD-10-CM

## 2018-03-27 DIAGNOSIS — R2689 Other abnormalities of gait and mobility: Secondary | ICD-10-CM

## 2018-03-27 DIAGNOSIS — R208 Other disturbances of skin sensation: Secondary | ICD-10-CM

## 2018-03-27 DIAGNOSIS — M6281 Muscle weakness (generalized): Secondary | ICD-10-CM

## 2018-03-27 DIAGNOSIS — R278 Other lack of coordination: Secondary | ICD-10-CM | POA: Diagnosis not present
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
## 2018-04-01 ENCOUNTER — Ambulatory Visit: Payer: Medicare Other | Admitting: Physical Therapy

## 2018-04-01 ENCOUNTER — Encounter: Payer: Self-pay | Admitting: Physical Therapy

## 2018-04-01 DIAGNOSIS — R296 Repeated falls: Secondary | ICD-10-CM | POA: Diagnosis not present

## 2018-04-01 DIAGNOSIS — R208 Other disturbances of skin sensation: Secondary | ICD-10-CM | POA: Diagnosis not present

## 2018-04-01 DIAGNOSIS — R2689 Other abnormalities of gait and mobility: Secondary | ICD-10-CM

## 2018-04-01 DIAGNOSIS — R278 Other lack of coordination: Secondary | ICD-10-CM

## 2018-04-01 DIAGNOSIS — M6281 Muscle weakness (generalized): Secondary | ICD-10-CM | POA: Diagnosis not present
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
## 2018-04-03 ENCOUNTER — Ambulatory Visit: Payer: Medicare Other | Admitting: Physical Therapy

## 2018-04-03 ENCOUNTER — Encounter: Payer: Self-pay | Admitting: Physical Therapy

## 2018-04-03 DIAGNOSIS — R296 Repeated falls: Secondary | ICD-10-CM | POA: Diagnosis not present

## 2018-04-03 DIAGNOSIS — M6281 Muscle weakness (generalized): Secondary | ICD-10-CM | POA: Diagnosis not present

## 2018-04-03 DIAGNOSIS — R278 Other lack of coordination: Secondary | ICD-10-CM | POA: Diagnosis not present

## 2018-04-03 DIAGNOSIS — R2689 Other abnormalities of gait and mobility: Secondary | ICD-10-CM | POA: Diagnosis not present

## 2018-04-03 DIAGNOSIS — R208 Other disturbances of skin sensation: Secondary | ICD-10-CM
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
## 2018-04-08 ENCOUNTER — Encounter: Payer: Self-pay | Admitting: Physical Therapy

## 2018-04-08 ENCOUNTER — Ambulatory Visit: Payer: Medicare Other | Admitting: Physical Therapy

## 2018-04-08 DIAGNOSIS — R296 Repeated falls: Secondary | ICD-10-CM | POA: Diagnosis not present

## 2018-04-08 DIAGNOSIS — R208 Other disturbances of skin sensation: Secondary | ICD-10-CM

## 2018-04-08 DIAGNOSIS — M6281 Muscle weakness (generalized): Secondary | ICD-10-CM | POA: Diagnosis not present

## 2018-04-08 DIAGNOSIS — R2689 Other abnormalities of gait and mobility: Secondary | ICD-10-CM | POA: Diagnosis not present

## 2018-04-08 DIAGNOSIS — R278 Other lack of coordination: Secondary | ICD-10-CM | POA: Diagnosis not present
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
## 2018-04-10 ENCOUNTER — Encounter: Payer: Self-pay | Admitting: Physical Therapy

## 2018-04-10 ENCOUNTER — Ambulatory Visit: Payer: Medicare Other | Admitting: Physical Therapy

## 2018-04-10 DIAGNOSIS — M6281 Muscle weakness (generalized): Secondary | ICD-10-CM | POA: Diagnosis not present

## 2018-04-10 DIAGNOSIS — R278 Other lack of coordination: Secondary | ICD-10-CM

## 2018-04-10 DIAGNOSIS — R208 Other disturbances of skin sensation: Secondary | ICD-10-CM | POA: Diagnosis not present

## 2018-04-10 DIAGNOSIS — R296 Repeated falls: Secondary | ICD-10-CM | POA: Diagnosis not present

## 2018-04-10 DIAGNOSIS — R2689 Other abnormalities of gait and mobility: Secondary | ICD-10-CM | POA: Diagnosis not present

## 2018-04-11 ENCOUNTER — Other Ambulatory Visit: Payer: Self-pay | Admitting: Cardiology
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
## 2018-04-13 DIAGNOSIS — M9903 Segmental and somatic dysfunction of lumbar region: Secondary | ICD-10-CM | POA: Diagnosis not present

## 2018-04-13 DIAGNOSIS — M5136 Other intervertebral disc degeneration, lumbar region: Secondary | ICD-10-CM | POA: Diagnosis not present

## 2018-04-13 DIAGNOSIS — M9905 Segmental and somatic dysfunction of pelvic region: Secondary | ICD-10-CM | POA: Diagnosis not present

## 2018-04-13 DIAGNOSIS — M9904 Segmental and somatic dysfunction of sacral region: Secondary | ICD-10-CM | POA: Diagnosis not present

## 2018-04-14 DIAGNOSIS — M9903 Segmental and somatic dysfunction of lumbar region: Secondary | ICD-10-CM | POA: Diagnosis not present

## 2018-04-14 DIAGNOSIS — M9904 Segmental and somatic dysfunction of sacral region: Secondary | ICD-10-CM | POA: Diagnosis not present

## 2018-04-14 DIAGNOSIS — M5134 Other intervertebral disc degeneration, thoracic region: Secondary | ICD-10-CM | POA: Diagnosis not present

## 2018-04-14 DIAGNOSIS — M9905 Segmental and somatic dysfunction of pelvic region: Secondary | ICD-10-CM | POA: Diagnosis not present

## 2018-04-15 DIAGNOSIS — M9905 Segmental and somatic dysfunction of pelvic region: Secondary | ICD-10-CM | POA: Diagnosis not present

## 2018-04-15 DIAGNOSIS — M5134 Other intervertebral disc degeneration, thoracic region: Secondary | ICD-10-CM | POA: Diagnosis not present

## 2018-04-15 DIAGNOSIS — M9903 Segmental and somatic dysfunction of lumbar region: Secondary | ICD-10-CM | POA: Diagnosis not present

## 2018-04-15 DIAGNOSIS — M9904 Segmental and somatic dysfunction of sacral region: Secondary | ICD-10-CM | POA: Diagnosis not present

## 2018-04-16 DIAGNOSIS — M9903 Segmental and somatic dysfunction of lumbar region: Secondary | ICD-10-CM | POA: Diagnosis not present

## 2018-04-16 DIAGNOSIS — M9905 Segmental and somatic dysfunction of pelvic region: Secondary | ICD-10-CM | POA: Diagnosis not present

## 2018-04-16 DIAGNOSIS — M5134 Other intervertebral disc degeneration, thoracic region: Secondary | ICD-10-CM | POA: Diagnosis not present

## 2018-04-16 DIAGNOSIS — M9904 Segmental and somatic dysfunction of sacral region: Secondary | ICD-10-CM | POA: Diagnosis not present

## 2018-04-17 ENCOUNTER — Ambulatory Visit: Payer: Medicare Other | Attending: Neurology | Admitting: Physical Therapy

## 2018-04-17 ENCOUNTER — Encounter: Payer: Self-pay | Admitting: Physical Therapy

## 2018-04-17 DIAGNOSIS — R2689 Other abnormalities of gait and mobility: Secondary | ICD-10-CM

## 2018-04-17 DIAGNOSIS — M6281 Muscle weakness (generalized): Secondary | ICD-10-CM | POA: Diagnosis not present

## 2018-04-17 DIAGNOSIS — R278 Other lack of coordination: Secondary | ICD-10-CM | POA: Diagnosis not present

## 2018-04-17 DIAGNOSIS — R208 Other disturbances of skin sensation: Secondary | ICD-10-CM

## 2018-04-17 DIAGNOSIS — R296 Repeated falls: Secondary | ICD-10-CM | POA: Diagnosis not present
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
## 2018-04-20 IMAGING — DX DG CHEST 2V
2 series · 2 of 2 positions shown · non-contrast
Comparison: 03/09/2015

CLINICAL DATA: Chest pain.

EXAM:
CHEST - 2 VIEW

[chest pa]
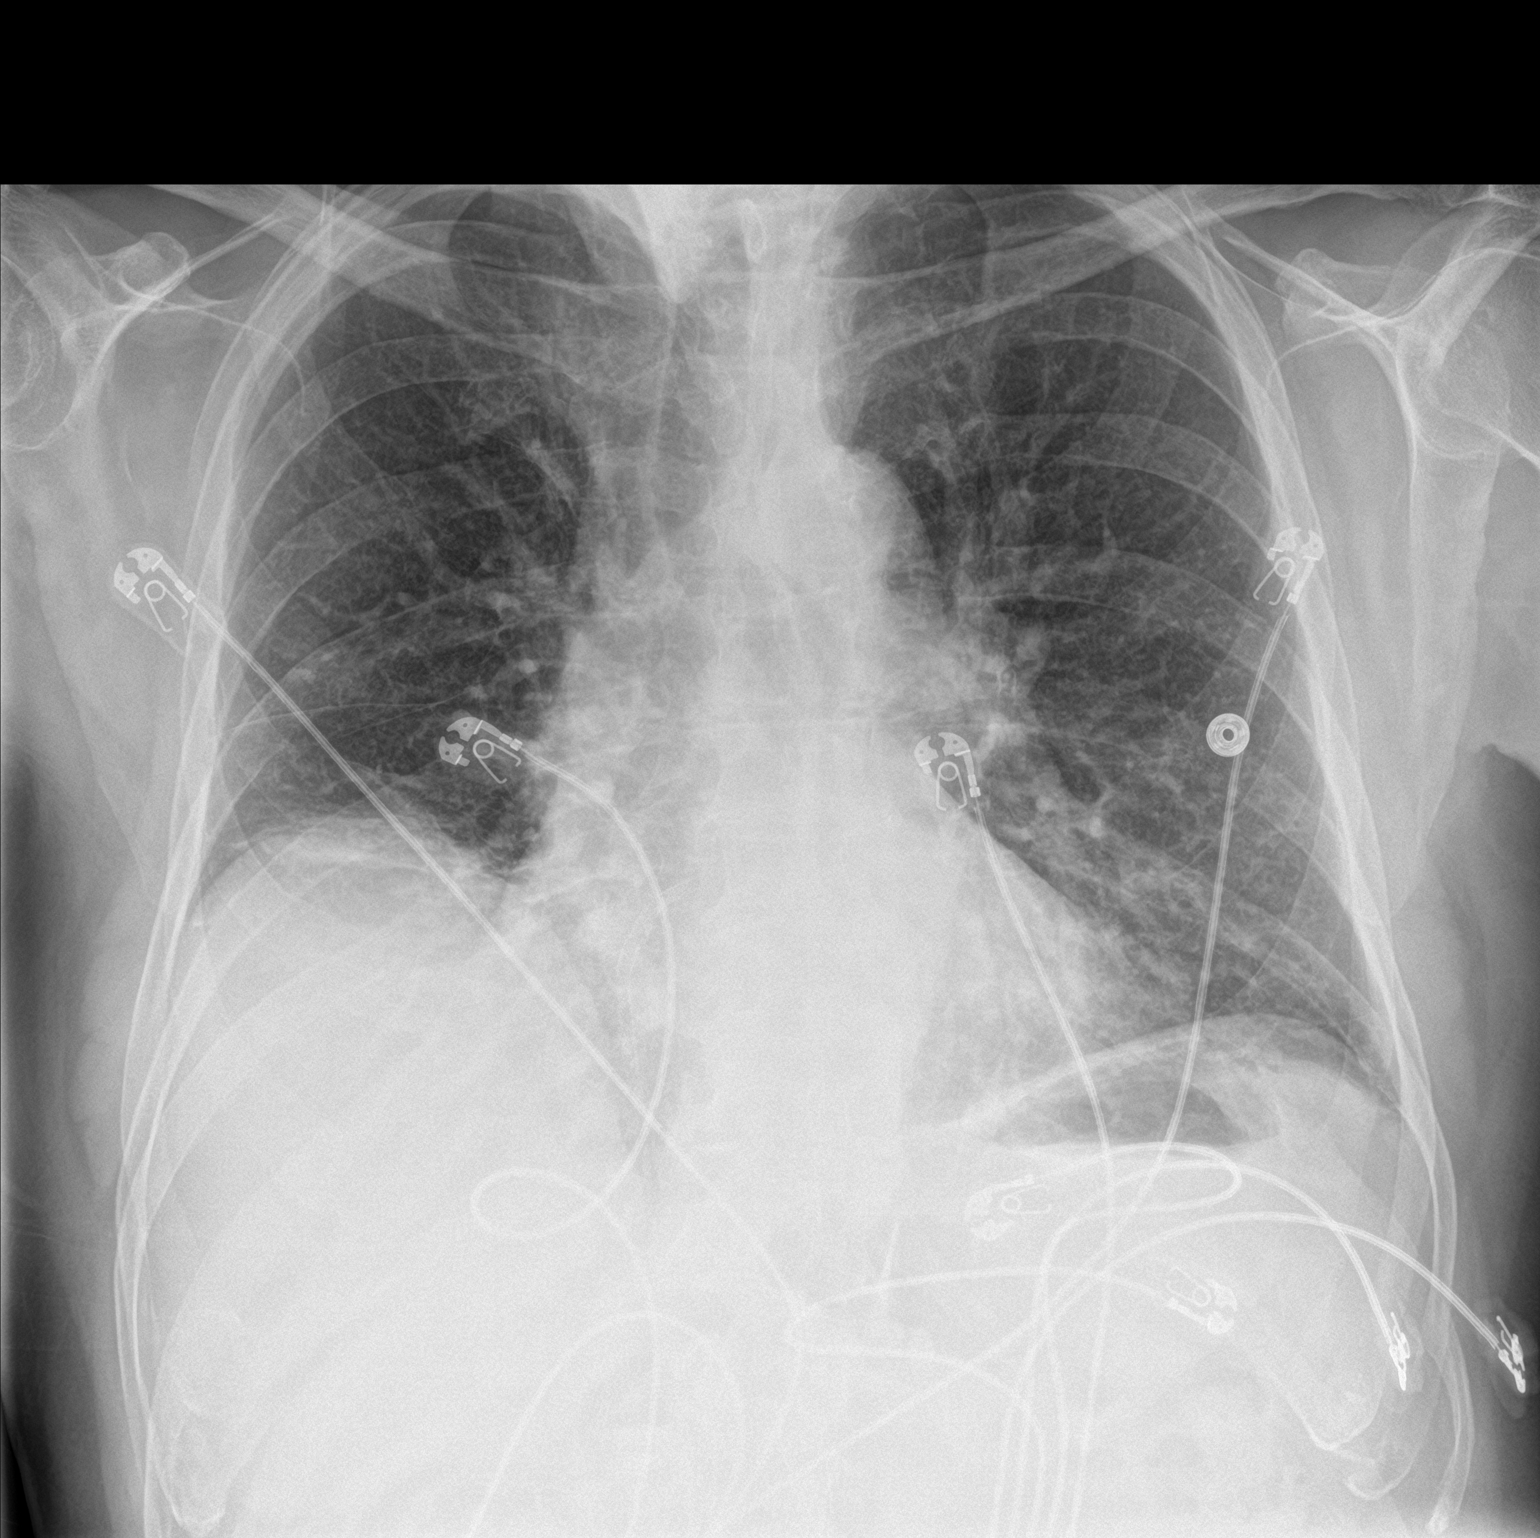

[chest lat]
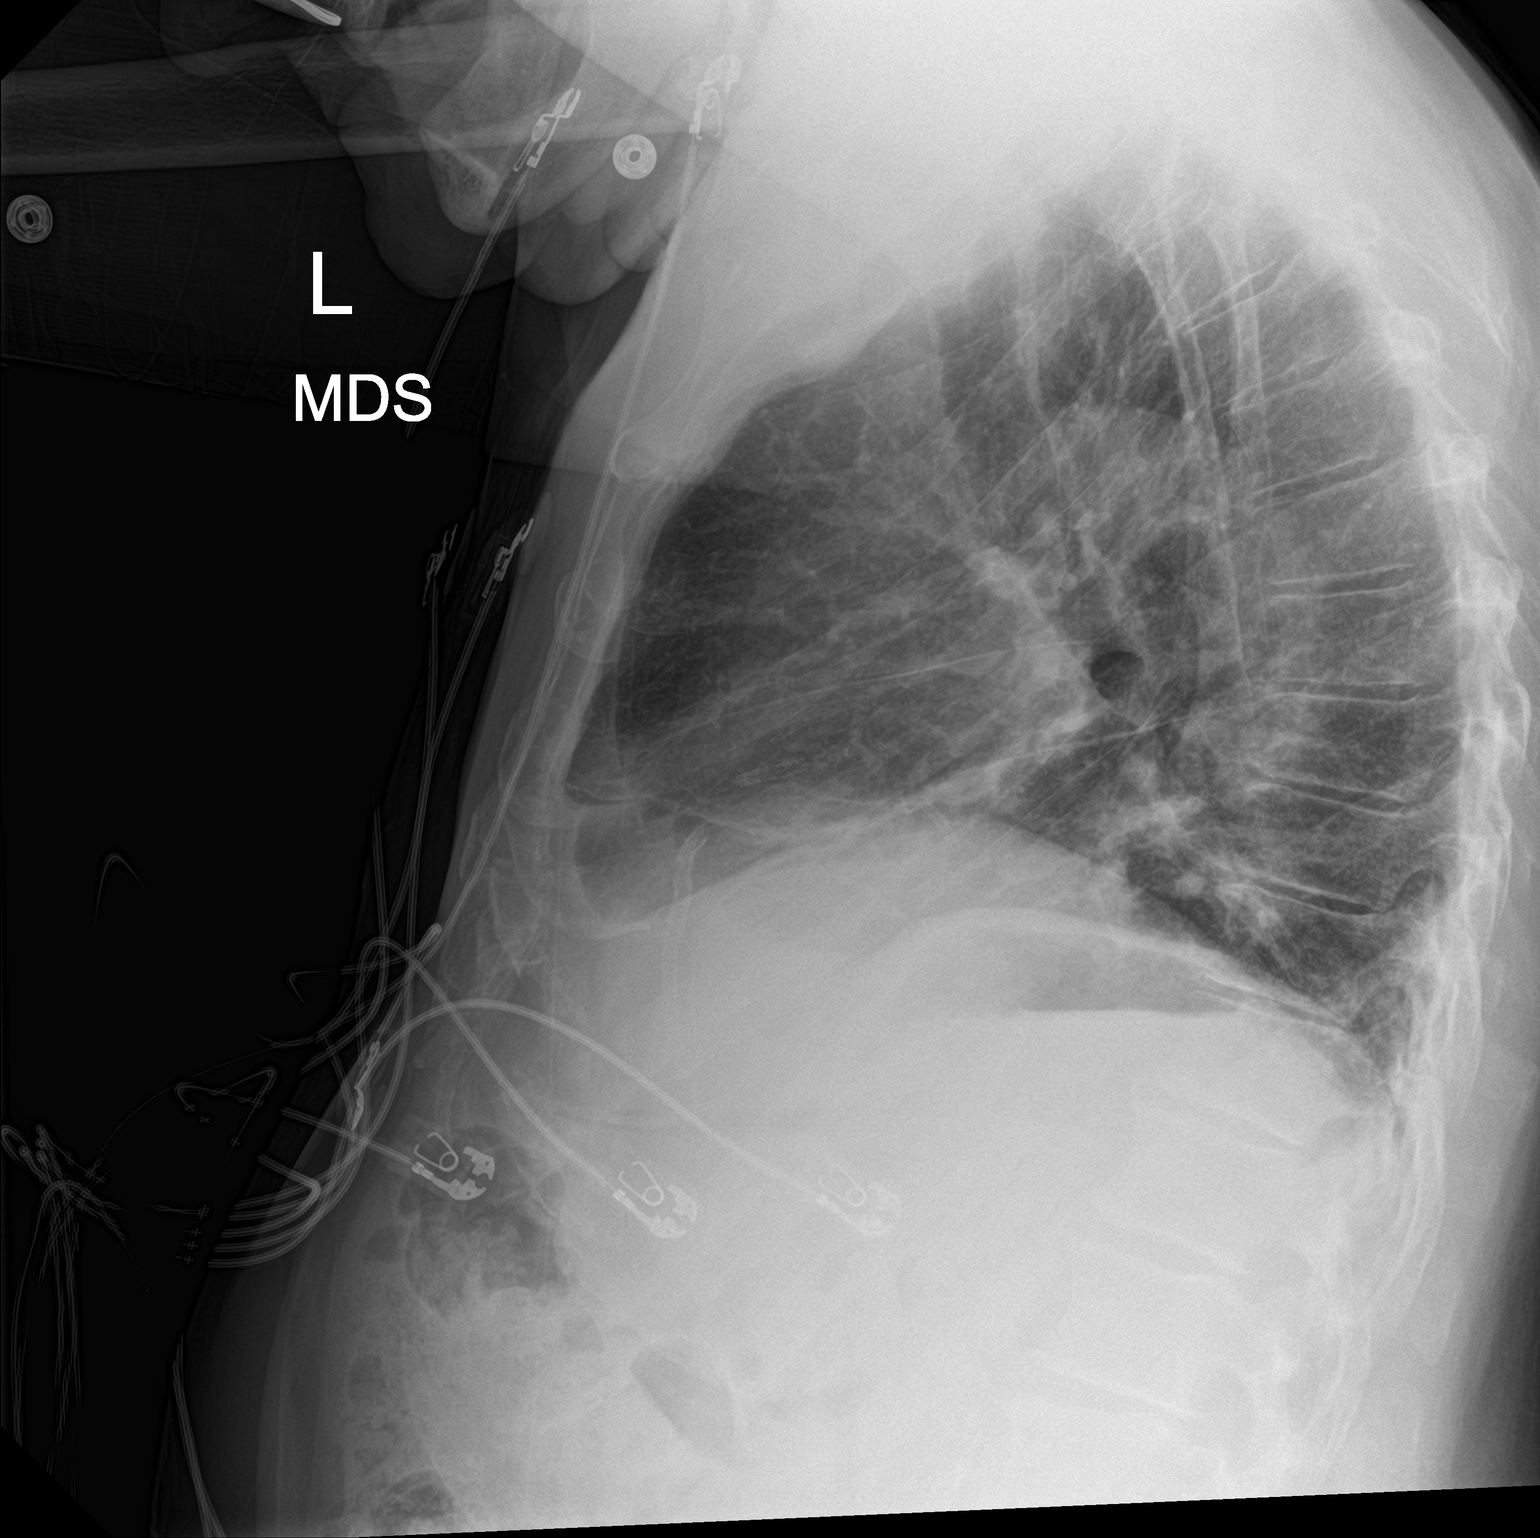

[2 of 2 positions shown; findings below may reference images not displayed]

FINDINGS: Chronic elevation of right hemidiaphragm with adjacent atelectasis
or scarring. Unchanged heart size and mediastinal contours. Coronary
stent visualized. Mild bronchial thickening. No confluent
consolidation. No pleural effusion or pneumothorax.
IMPRESSION: Chronic elevation of right hemidiaphragm with right lung base
atelectasis or scarring. No acute abnormality.

## 2018-04-21 ENCOUNTER — Ambulatory Visit: Payer: Medicare Other | Admitting: Physical Therapy

## 2018-04-21 ENCOUNTER — Encounter: Payer: Self-pay | Admitting: Physical Therapy

## 2018-04-21 DIAGNOSIS — R296 Repeated falls: Secondary | ICD-10-CM

## 2018-04-21 DIAGNOSIS — R2689 Other abnormalities of gait and mobility: Secondary | ICD-10-CM | POA: Diagnosis not present

## 2018-04-21 DIAGNOSIS — R208 Other disturbances of skin sensation: Secondary | ICD-10-CM | POA: Diagnosis not present

## 2018-04-21 DIAGNOSIS — R278 Other lack of coordination: Secondary | ICD-10-CM | POA: Diagnosis not present

## 2018-04-21 DIAGNOSIS — M6281 Muscle weakness (generalized): Secondary | ICD-10-CM
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
## 2018-04-21 NOTE — Patient Instructions (Signed)
Knee to Chest (Flexion)    Pull knee toward chest. Feel stretch in lower back or buttock area. Breathing deeply, Hold __15-20__ seconds. Repeat with other knee. Repeat __3-5__ times. Do __2__ sessions per day.   Lumbar Rotation (Non-Weight Bearing)    Feet on floor, slowly rock knees from side to side in small, pain-free range of motion. Allow lower back to rotate slightly. Repeat __10__ times per set. Do __2__ sets per session.

## 2018-04-22 DIAGNOSIS — M5134 Other intervertebral disc degeneration, thoracic region: Secondary | ICD-10-CM | POA: Diagnosis not present

## 2018-04-22 DIAGNOSIS — M9904 Segmental and somatic dysfunction of sacral region: Secondary | ICD-10-CM | POA: Diagnosis not present

## 2018-04-22 DIAGNOSIS — M9903 Segmental and somatic dysfunction of lumbar region: Secondary | ICD-10-CM | POA: Diagnosis not present

## 2018-04-22 DIAGNOSIS — M9905 Segmental and somatic dysfunction of pelvic region: Secondary | ICD-10-CM | POA: Diagnosis not present

## 2018-04-23 ENCOUNTER — Ambulatory Visit: Payer: Medicare Other

## 2018-04-23 DIAGNOSIS — M9903 Segmental and somatic dysfunction of lumbar region: Secondary | ICD-10-CM | POA: Diagnosis not present

## 2018-04-23 DIAGNOSIS — R2689 Other abnormalities of gait and mobility: Secondary | ICD-10-CM | POA: Diagnosis not present

## 2018-04-23 DIAGNOSIS — R278 Other lack of coordination: Secondary | ICD-10-CM | POA: Diagnosis not present

## 2018-04-23 DIAGNOSIS — M5134 Other intervertebral disc degeneration, thoracic region: Secondary | ICD-10-CM | POA: Diagnosis not present

## 2018-04-23 DIAGNOSIS — R208 Other disturbances of skin sensation: Secondary | ICD-10-CM | POA: Diagnosis not present

## 2018-04-23 DIAGNOSIS — M6281 Muscle weakness (generalized): Secondary | ICD-10-CM

## 2018-04-23 DIAGNOSIS — M9905 Segmental and somatic dysfunction of pelvic region: Secondary | ICD-10-CM | POA: Diagnosis not present

## 2018-04-23 DIAGNOSIS — M9904 Segmental and somatic dysfunction of sacral region: Secondary | ICD-10-CM | POA: Diagnosis not present

## 2018-04-23 DIAGNOSIS — R296 Repeated falls: Secondary | ICD-10-CM | POA: Diagnosis not present

## 2018-04-23 NOTE — Patient Instructions (Signed)
Lumbar Rotation (Non-Weight Bearing)    Feet on floor, slowly rock knees from side to side in small, pain-free range of motion. Allow lower back to rotate slightly.  WHEN FINISHED WITH SLOWLY ROCKING, PERFORM 30 SECOND HOLDS ON EACH SIDE, 3 TIMES PER SIDE. Repeat __10__ times per set. Do __2__ sets per session.    Piriformis Stretch, Supine    Lie supine, one ankle crossed onto opposite knee.   For deeper stretch gently push top knee away from body for  _30__ seconds. Repeat _3__ times per session. Do _2-3__ sessions per day. Perform on each leg.  PROGRESSION: Holding bottom leg behind knee, gently pull legs toward chest until stretch is felt in buttock of top leg. Copyright  VHI. All rights reserved.
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
## 2018-04-28 ENCOUNTER — Encounter: Payer: Self-pay | Admitting: Physical Therapy

## 2018-04-28 ENCOUNTER — Ambulatory Visit: Payer: Medicare Other | Admitting: Physical Therapy

## 2018-04-28 DIAGNOSIS — R208 Other disturbances of skin sensation: Secondary | ICD-10-CM

## 2018-04-28 DIAGNOSIS — R2689 Other abnormalities of gait and mobility: Secondary | ICD-10-CM | POA: Diagnosis not present

## 2018-04-28 DIAGNOSIS — R296 Repeated falls: Secondary | ICD-10-CM

## 2018-04-28 DIAGNOSIS — M6281 Muscle weakness (generalized): Secondary | ICD-10-CM

## 2018-04-28 DIAGNOSIS — R278 Other lack of coordination: Secondary | ICD-10-CM | POA: Diagnosis not present
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
## 2018-04-30 ENCOUNTER — Ambulatory Visit: Payer: Medicare Other

## 2018-04-30 DIAGNOSIS — R278 Other lack of coordination: Secondary | ICD-10-CM

## 2018-04-30 DIAGNOSIS — R2689 Other abnormalities of gait and mobility: Secondary | ICD-10-CM

## 2018-04-30 DIAGNOSIS — R296 Repeated falls: Secondary | ICD-10-CM | POA: Diagnosis not present

## 2018-04-30 DIAGNOSIS — M6281 Muscle weakness (generalized): Secondary | ICD-10-CM

## 2018-04-30 DIAGNOSIS — R208 Other disturbances of skin sensation: Secondary | ICD-10-CM | POA: Diagnosis not present
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
## 2018-05-04 ENCOUNTER — Encounter: Payer: Self-pay | Admitting: Physical Therapy

## 2018-05-04 ENCOUNTER — Ambulatory Visit: Payer: Medicare Other | Admitting: Physical Therapy

## 2018-05-04 DIAGNOSIS — M6281 Muscle weakness (generalized): Secondary | ICD-10-CM | POA: Diagnosis not present

## 2018-05-04 DIAGNOSIS — R278 Other lack of coordination: Secondary | ICD-10-CM | POA: Diagnosis not present

## 2018-05-04 DIAGNOSIS — R2689 Other abnormalities of gait and mobility: Secondary | ICD-10-CM

## 2018-05-04 DIAGNOSIS — R296 Repeated falls: Secondary | ICD-10-CM | POA: Diagnosis not present

## 2018-05-04 DIAGNOSIS — R208 Other disturbances of skin sensation: Secondary | ICD-10-CM | POA: Diagnosis not present
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
## 2018-05-06 ENCOUNTER — Encounter (INDEPENDENT_AMBULATORY_CARE_PROVIDER_SITE_OTHER): Payer: Medicare Other | Admitting: Ophthalmology

## 2018-05-06 DIAGNOSIS — H43813 Vitreous degeneration, bilateral: Secondary | ICD-10-CM

## 2018-05-06 DIAGNOSIS — H35033 Hypertensive retinopathy, bilateral: Secondary | ICD-10-CM

## 2018-05-06 DIAGNOSIS — I10 Essential (primary) hypertension: Secondary | ICD-10-CM

## 2018-05-06 DIAGNOSIS — H353231 Exudative age-related macular degeneration, bilateral, with active choroidal neovascularization: Secondary | ICD-10-CM

## 2018-05-07 ENCOUNTER — Ambulatory Visit: Payer: Medicare Other

## 2018-05-07 DIAGNOSIS — R296 Repeated falls: Secondary | ICD-10-CM | POA: Diagnosis not present

## 2018-05-07 DIAGNOSIS — R208 Other disturbances of skin sensation: Secondary | ICD-10-CM | POA: Diagnosis not present

## 2018-05-07 DIAGNOSIS — R2689 Other abnormalities of gait and mobility: Secondary | ICD-10-CM | POA: Diagnosis not present

## 2018-05-07 DIAGNOSIS — M6281 Muscle weakness (generalized): Secondary | ICD-10-CM | POA: Diagnosis not present

## 2018-05-07 DIAGNOSIS — R278 Other lack of coordination: Secondary | ICD-10-CM
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
## 2018-05-19 DIAGNOSIS — C4441 Basal cell carcinoma of skin of scalp and neck: Secondary | ICD-10-CM | POA: Diagnosis not present

## 2018-05-29 ENCOUNTER — Other Ambulatory Visit: Payer: Self-pay | Admitting: Cardiology

## 2018-06-03 DIAGNOSIS — C61 Malignant neoplasm of prostate: Secondary | ICD-10-CM | POA: Diagnosis not present

## 2018-06-05 DIAGNOSIS — Z961 Presence of intraocular lens: Secondary | ICD-10-CM | POA: Diagnosis not present

## 2018-06-05 DIAGNOSIS — H401212 Low-tension glaucoma, right eye, moderate stage: Secondary | ICD-10-CM | POA: Diagnosis not present

## 2018-06-05 DIAGNOSIS — H353231 Exudative age-related macular degeneration, bilateral, with active choroidal neovascularization: Secondary | ICD-10-CM | POA: Diagnosis not present

## 2018-06-05 DIAGNOSIS — H40053 Ocular hypertension, bilateral: Secondary | ICD-10-CM | POA: Diagnosis not present

## 2018-06-05 DIAGNOSIS — H401223 Low-tension glaucoma, left eye, severe stage: Secondary | ICD-10-CM | POA: Diagnosis not present

## 2018-06-08 DIAGNOSIS — R338 Other retention of urine: Secondary | ICD-10-CM | POA: Diagnosis not present

## 2018-06-08 DIAGNOSIS — C61 Malignant neoplasm of prostate: Secondary | ICD-10-CM | POA: Diagnosis not present

## 2018-06-24 ENCOUNTER — Encounter (INDEPENDENT_AMBULATORY_CARE_PROVIDER_SITE_OTHER): Payer: Medicare HMO | Admitting: Ophthalmology

## 2018-06-24 DIAGNOSIS — I4891 Unspecified atrial fibrillation: Secondary | ICD-10-CM | POA: Diagnosis not present

## 2018-06-24 DIAGNOSIS — E7800 Pure hypercholesterolemia, unspecified: Secondary | ICD-10-CM | POA: Diagnosis not present

## 2018-06-24 DIAGNOSIS — R2681 Unsteadiness on feet: Secondary | ICD-10-CM | POA: Diagnosis not present

## 2018-06-24 DIAGNOSIS — H43813 Vitreous degeneration, bilateral: Secondary | ICD-10-CM

## 2018-06-24 DIAGNOSIS — I10 Essential (primary) hypertension: Secondary | ICD-10-CM

## 2018-06-24 DIAGNOSIS — Z0000 Encounter for general adult medical examination without abnormal findings: Secondary | ICD-10-CM | POA: Diagnosis not present

## 2018-06-24 DIAGNOSIS — Z1389 Encounter for screening for other disorder: Secondary | ICD-10-CM | POA: Diagnosis not present

## 2018-06-24 DIAGNOSIS — E538 Deficiency of other specified B group vitamins: Secondary | ICD-10-CM | POA: Diagnosis not present

## 2018-06-24 DIAGNOSIS — H35033 Hypertensive retinopathy, bilateral: Secondary | ICD-10-CM | POA: Diagnosis not present

## 2018-06-24 DIAGNOSIS — H353231 Exudative age-related macular degeneration, bilateral, with active choroidal neovascularization: Secondary | ICD-10-CM | POA: Diagnosis not present

## 2018-06-24 DIAGNOSIS — I69359 Hemiplegia and hemiparesis following cerebral infarction affecting unspecified side: Secondary | ICD-10-CM | POA: Diagnosis not present

## 2018-06-29 DIAGNOSIS — J069 Acute upper respiratory infection, unspecified: Secondary | ICD-10-CM | POA: Diagnosis not present

## 2018-06-29 DIAGNOSIS — R05 Cough: Secondary | ICD-10-CM | POA: Diagnosis not present

## 2018-07-13 ENCOUNTER — Other Ambulatory Visit: Payer: Self-pay

## 2018-07-13 ENCOUNTER — Emergency Department (HOSPITAL_BASED_OUTPATIENT_CLINIC_OR_DEPARTMENT_OTHER)
Admission: EM | Admit: 2018-07-13 | Discharge: 2018-07-13 | Disposition: A | Discharge status: Home / Self Care | Payer: Medicare HMO | Attending: Emergency Medicine | Admitting: Emergency Medicine

## 2018-07-13 ENCOUNTER — Emergency Department (HOSPITAL_BASED_OUTPATIENT_CLINIC_OR_DEPARTMENT_OTHER): Payer: Medicare HMO

## 2018-07-13 ENCOUNTER — Encounter (HOSPITAL_BASED_OUTPATIENT_CLINIC_OR_DEPARTMENT_OTHER): Payer: Self-pay | Admitting: *Deleted

## 2018-07-13 DIAGNOSIS — Z7902 Long term (current) use of antithrombotics/antiplatelets: Secondary | ICD-10-CM | POA: Insufficient documentation

## 2018-07-13 DIAGNOSIS — Z7901 Long term (current) use of anticoagulants: Secondary | ICD-10-CM | POA: Diagnosis not present

## 2018-07-13 DIAGNOSIS — I10 Essential (primary) hypertension: Secondary | ICD-10-CM | POA: Insufficient documentation

## 2018-07-13 DIAGNOSIS — Y939 Activity, unspecified: Secondary | ICD-10-CM | POA: Diagnosis not present

## 2018-07-13 DIAGNOSIS — Y999 Unspecified external cause status: Secondary | ICD-10-CM | POA: Diagnosis not present

## 2018-07-13 DIAGNOSIS — Y929 Unspecified place or not applicable: Secondary | ICD-10-CM | POA: Diagnosis not present

## 2018-07-13 DIAGNOSIS — Z79899 Other long term (current) drug therapy: Secondary | ICD-10-CM | POA: Insufficient documentation

## 2018-07-13 DIAGNOSIS — W1789XA Other fall from one level to another, initial encounter: Secondary | ICD-10-CM | POA: Insufficient documentation

## 2018-07-13 DIAGNOSIS — I252 Old myocardial infarction: Secondary | ICD-10-CM | POA: Diagnosis not present

## 2018-07-13 DIAGNOSIS — M9901 Segmental and somatic dysfunction of cervical region: Secondary | ICD-10-CM | POA: Diagnosis not present

## 2018-07-13 DIAGNOSIS — M542 Cervicalgia: Secondary | ICD-10-CM | POA: Diagnosis not present

## 2018-07-13 DIAGNOSIS — R51 Headache: Secondary | ICD-10-CM | POA: Diagnosis not present

## 2018-07-13 DIAGNOSIS — M62838 Other muscle spasm: Secondary | ICD-10-CM | POA: Diagnosis not present

## 2018-07-13 DIAGNOSIS — Z955 Presence of coronary angioplasty implant and graft: Secondary | ICD-10-CM | POA: Insufficient documentation

## 2018-07-13 DIAGNOSIS — I2510 Atherosclerotic heart disease of native coronary artery without angina pectoris: Secondary | ICD-10-CM | POA: Diagnosis not present

## 2018-07-13 DIAGNOSIS — M5030 Other cervical disc degeneration, unspecified cervical region: Secondary | ICD-10-CM | POA: Diagnosis not present

## 2018-07-13 MED ORDER — CYCLOBENZAPRINE HCL 5 MG PO TABS
5.0000 mg | ORAL_TABLET | Freq: Once | ORAL | Status: AC
  Administered 2018-07-13: 5 mg via ORAL
  Filled 2018-07-13: qty 1

## 2018-07-13 MED ORDER — CYCLOBENZAPRINE HCL 5 MG PO TABS: 5.0000 mg | ORAL_TABLET | Freq: Two times a day (BID) | ORAL | 0 refills | Status: AC | PRN

## 2018-07-13 NOTE — ED Notes (Signed)
Pt to CT via [employer]
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
## 2018-07-13 NOTE — ED Triage Notes (Addendum)
He lost his balance and fell 2 days ago hitting concrete. Injury to his buttock, neck and head pain. He has been walking with his cane. He is alert oriented per wife. ccollar at triage. He takes blood thinners.

## 2018-07-16 DIAGNOSIS — M542 Cervicalgia: Secondary | ICD-10-CM | POA: Diagnosis not present

## 2018-07-28 ENCOUNTER — Encounter: Payer: Self-pay | Admitting: Neurology

## 2018-07-28 ENCOUNTER — Ambulatory Visit (INDEPENDENT_AMBULATORY_CARE_PROVIDER_SITE_OTHER): Payer: Medicare HMO | Admitting: Neurology

## 2018-07-28 VITALS — BP 147/77 | HR 98 | Ht 70.0 in | Wt 195.0 lb

## 2018-07-28 DIAGNOSIS — R269 Unspecified abnormalities of gait and mobility: Secondary | ICD-10-CM

## 2018-07-28 DIAGNOSIS — F0280 Dementia in other diseases classified elsewhere without behavioral disturbance: Secondary | ICD-10-CM | POA: Diagnosis not present

## 2018-07-28 DIAGNOSIS — W19XXXA Unspecified fall, initial encounter: Secondary | ICD-10-CM

## 2018-07-28 DIAGNOSIS — S134XXA Sprain of ligaments of cervical spine, initial encounter: Secondary | ICD-10-CM | POA: Diagnosis not present

## 2018-07-28 DIAGNOSIS — R69 Illness, unspecified: Secondary | ICD-10-CM | POA: Diagnosis not present

## 2018-07-28 DIAGNOSIS — G301 Alzheimer's disease with late onset: Secondary | ICD-10-CM

## 2018-07-28 NOTE — Progress Notes (Signed)
[name]    Provider:  Dr Jaynee Eagles Referring Provider: Lawerance Cruel, MD Primary Care Physician:  Lawerance Cruel, MD  CC:  CVA and memory loss, falls, gait abnormality  Here with wife. His memory and cognition is worsening. He had a memory test 2 years ago Dxed with [facility] and recommended f/u in one year, will repeat. He has been having falls. Noticeable changes. He went to [facility] and fell backwards. He has severe spasms in his neck. He is a lot slower, walking more carefully, pain in the neck, his neck is feeling better. He has been more irritable since the fall, no dizziness, no headaches, sleep has changed since hititng his head.   Personally reviewed imaging and agree with the following:   07/13/18: 1. No acute intracranial abnormality. No skull fracture. 2. Stable atrophy and chronic small vessel ischemia  MRI brain 03/2017:  1.    Moderately severe generalized cortical atrophy, similar in appearance to the 03/14/2015 MRI  2.    Minimal chronic microvascular ischemic change, 3.    There are no acute findings.   Interval history July 28, 2017: Patient returns today after formal neurocognitive testing revealed mild cognitive impairment however patient did above average and superior on many of the aspects of the testing.However there were deficits in ability to learn and retain new verbal information, processing speed and verbal fluency were below expectation, auditory comprehension was impaired.  Testing did not correspond with MRI findings which showed moderately severe generalized cortical atrophy.  Reviewed these images with patient and wife, answered questions, MRI did not change compared to March 14, 2015.  Very minimal chronic microvascular ischemic change.  Discussed this testing, next steps which include follow-up in 1 year.  Also patient has a history of sleep apnea, it was suggested that he might have this repeated which I think is an excellent  idea.  He also has B12 deficiency which we found his B12 was less than 150 which is quite low, we will repeat this in 6 months he has been on injections.  We will send him to speech therapy for his dysarthria  HPI:  Dominic [name] is a 85 y.o. adult here as a referral from Dr. Harrington Challenger for CVA. PMHx OSA, RBBB, HTN, HLD, CAD, afib with rvr. Patient is here for memory problems since CVA. Strokes were in 2016. Wife is here provides most information. Cannot hold onto concepts well. Also with balance problems. Has been through cognitive behavioral therapy with speech therapy. Her a review of records, patient underwent cardiac catheterization in late September and percutaneous intervention, had significant sensation of dizziness and feeling off balance. He felt unsteady with his gait. Patient had bilateral punctate acute infarcts right greater than left likely embolic related to Procedure. Most likely due to A. fib hold off and all questions due to temporal relationship.Here with his wife. After the procedure he was not the same. He has been struggling since the stroke, he struggling with balance (but he does a PMHx of neuropathy in the feet). He has had physical and speech therapy. But he has been having short-term memory problems for several years beforehand. He saw a nutritionist and they recommend some medication that helped. He fell at work, he tripped over something on the group and now he is on administrative leave. The memory improved back to baseline after the stroke. But memory has worsened. Things did improve with speech therapy recently. Mother had dementia.  Wife pays the bills  but husband says he wouldhave difficulty paying the bills. Even simple bill keeping would be difficult. He finds it dificult to read. He has decreased concentration. He has some minimal difficulty with the day and date but not the year but it is because he doesn't work or notice the day or date. He is having difficulty driving, gets into  some "scrapes". Wife drives mostly. No hallucinations or delusions. They don't get out much so unclear if he has difficulty remembering people. But he walks into the kitchen and firgets what he is doing. No mood difficulties, no depression or anxiety.No other focal neurologic deficits, associated symptoms, inciting events or modifiable factors.  Reviewed notes, labs and imaging from outside physicians, which showed:  MRI brain  03/13/2017: Personally reviewed MRI of the brain images (also reviewed with patient and his wife) and agree with the following: Multiple small areas of infarct involving the right frontal lobe,right parietal lobe, and left parietal lobe. These are consistent with acute embolic infarction.  Review of Systems: Patient complains of symptoms per HPI as well as the following symptoms: memory loss, imbalance. Pertinent negatives and positives per HPI. All others negative.   Social History   Socioeconomic History  . Marital status: Married    Spouse name: Not on file  . Number of children: 6  . Years of education: Not on file  . Highest education level: Some college, no degree  Occupational History  . Not on file  Social Needs  . Financial resource strain: Not on file  . Food insecurity:    Worry: Not on file    Inability: Not on file  . Transportation needs:    Medical: Not on file    Non-medical: Not on file  Tobacco Use  . Smoking status: Never Smoker  . Smokeless tobacco: Never Used  Substance and Sexual Activity  . Alcohol use: No  . Drug use: No  . Sexual activity: Not on file  Lifestyle  . Physical activity:    Days per week: Not on file    Minutes per session: Not on file  . Stress: Not on file  Relationships  . Social connections:    Talks on phone: Not on file    Gets together: Not on file    Attends religious service: Not on file    Active member of club or organization: Not on file    Attends meetings of clubs or organizations: Not on file     Relationship status: Not on file  . Intimate partner violence:    Fear of current or ex partner: Not on file    Emotionally abused: Not on file    Physically abused: Not on file    Forced sexual activity: Not on file  Other Topics Concern  . Not on file  Social History Narrative   Lives at home with his wife   Right handed   Drinks no caffeine    Family History  Problem Relation Age of Onset  . Emphysema Mother   . Cancer Father     Past Medical History:  Diagnosis Date  . Amputated finger    a. L index d/t to dog bite.  . Atrial fibrillation with RVR ([place])    a. New onset diagnosed 11/20/2013, spont converted to NSR.  Marland Kitchen Cataracts, bilateral   . Concussion   . Coronary artery disease 09/2004, 04/2005, 02/2015   a. Stent to prox and mid RCA 08/2001. b. DES to RCA for ISR 08/2004. c.  DES to [facility] for ISR 05/2005. d. Low risk nuc 10/2013 (done for CP in setting of new AF). e. PCI to the mid-RCA for in-stent restenosis with cutting balloon angioplasty f. PCI to an OM2 lesion  . Dyslipidemia    a. Intol of statins.  . Fracture acetabulum-closed ([place]) 2015  . Fractured pelvis ([place]) 2015  . Hypertension   . Lung nodule    , right upper lobe  . Macular degeneration    both eyes, receives shots in his eyes  . Obstructive sleep apnea   . OSA (obstructive sleep apnea)   . Prostate cancer ([place])   . RBBB (right bundle branch block)     Past Surgical History:  Procedure Laterality Date  . ANTERIOR CRUCIATE LIGAMENT REPAIR Right   . CARDIAC CATHETERIZATION N/A 03/10/2015   Procedure: Left Heart Cath and Coronary Angiography;  Surgeon: Lorretta Harp, MD;  Location: [facility];  Service: Cardiovascular;  Laterality: N/A;  . CARDIAC CATHETERIZATION  03/13/2015   Procedure: Coronary Stent Intervention;  Surgeon: Peter M Martinique, MD;  Location: [facility];  Service: Cardiovascular;;  . CARDIAC CATHETERIZATION  03/13/2015   Procedure: Intravascular Pressure Wire/FFR Study;   Surgeon: Peter M Martinique, MD;  Location: [facility];  Service: Cardiovascular;;  . CATARACT EXTRACTION Left 08/2017  . CATARACT EXTRACTION Right 10/06/2017  . CORONARY STENT PLACEMENT    . L knee ligament replacement    . PROSTATECTOMY    . TONSILLECTOMY    . TOOTH EXTRACTION      Current Outpatient Medications  Medication Sig Dispense Refill  . amLODipine (NORVASC) 5 MG tablet TAKE 1 TABLET BY MOUTH EVERY DAY 90 tablet 1  . clopidogrel (PLAVIX) 75 MG tablet TAKE 1 TABLET BY MOUTH EVERY DAY 90 tablet 0  . ELIQUIS 5 MG TABS tablet TAKE 1 TABLET BY MOUTH TWICE A DAY 180 tablet 1  . metoprolol succinate (TOPROL-XL) 25 MG 24 hr tablet Take 3 tablets (75 mg total) by mouth daily. 270 tablet 1  . Multiple Vitamins-Minerals (PRESERVISION AREDS) CAPS Take 1 capsule by mouth 2 (two) times daily.    . nitroGLYCERIN (NITROSTAT) 0.4 MG SL tablet PLACE ONE TABLET UNDER THE TONGUE EVERY 5 MINUTES AS NEEDED FOR CHEST PAIN 25 tablet 3  . NON FORMULARY as directed. tocotrienols    . Piracetam POWD Take 2 scoop by mouth daily.     Marland Kitchen Ubiquinol 100 MG CAPS Take 100 mg by mouth 2 (two) times daily.     Marland Kitchen VIGAMOX 0.5 % ophthalmic solution INSTILL ONE DROP INTO LEFT EYE 4 TIMES A DAY FOR 2 DAYS AFTER EACH MONTHLY EYE INJECTION  12  . vitamin B-12 (CYANOCOBALAMIN) 1000 MCG tablet Take 1,000 mcg by mouth daily.     No current facility-administered medications for this visit.     Allergies as of 07/28/2018 - Review Complete 07/28/2018  Allergen Reaction Noted  . Statins Other (See Comments) 04/28/2013  . Zetia [ezetimibe] Other (See Comments) 04/28/2013    Vitals: BP (!) 147/77   Pulse 98   Ht 5\' 10"  (1.778 m)   Wt 195 lb (88.5 kg)   BMI 27.98 kg/m  Last Weight:  Wt Readings from Last 1 Encounters:  07/28/18 195 lb (88.5 kg)   Last Height:   Ht Readings from Last 1 Encounters:  07/28/18 5\' 10"  (1.778 m)    Physical exam: Exam: Gen: NAD, conversant, Orofacial dyskinesias  CV: RRR, no MRG. No Carotid Bruits. No peripheral edema, warm, nontender Eyes: Conjunctivae clear without exudates or hemorrhage  Neuro: Detailed Neurologic Exam  Speech:    Speech is dysarthric without aphasia with normal comprehension.  Cognition:     MMSE - Mini Mental State Exam 07/28/2018 03/25/2017  Orientation to time 4 5  Orientation to Place 5 5  Registration 3 3  Attention/ Calculation 4 5  Recall 0 1  Language- name 2 objects 2 2  Language- repeat 1 1  Language- follow 3 step command 3 3  Language- read & follow direction 1 1  Write a sentence 0 1  Copy design 1 1  Total score 24 28   Cranial Nerves:    The pupils are equal, round, and reactive to light. Attempted funduscopic exam could not visualize. Visual fields are full to finger confrontation. Extraocular movements are intact. Trigeminal sensation is intact and the muscles of mastication are normal. The face is symmetric. The palate elevates in the midline. Hearing impaired. Voice is normal. Shoulder shrug is normal. The tongue has normal motion without fasciculations.   Coordination:    No dysmetria noted  Gait:    Wide based, imbalance, cannot heel or toe or tandem, shuffling  Motor Observation:    Orofacial dyskinesias Tone:    Normal muscle tone.  No rigidity or cogwheeling.  Posture:    Posture is stooped using a walking aid, reversal of the normal cervical lordosis    Strength:    Strength is V/V in the upper and lower limbs.      Sensation: intact to LT     Reflex Exam:  DTR's:    Deep tendon reflexes in the upper are brisk and in the lower extremities are hyporeflexic bilaterally.   Toes:    The toes are downgoing bilaterally.   Clonus:    Clonus is absent.  Assessment/Plan:  This is an 85 year old male with a family history of Alzheimer's initially seen for progressive memory loss. Patient had an embolic shower of strokes in 2016 however patient feels as though he returned back to his  baseline at that time following strokes but memory has since declined and continues to decline. He would like to know if this is onset of dementia. MMSE was 28 out of 30 2 years ago and today is 24/30.  Diagnosed with MCI by formal memory testing 2 years ago but may be mild/early Alzheimer's dementia at this point. Also gait abnormalities, significant atrophy of brain, wife is concerned about his driving and memory.  - mild Post-concussive syndrome - recently fell and hit head, had significant neck pain and spasms afterwards will ask PT to address(along with gait abnormality).   - lower extremity shuffling, may be lower-body parkinsonism (not parkinson's disease) due to brain atrophy and/or dementia.   - physical therapy for gait abnormality at neuro rehab  - discussed limiting driving and will get Formal driving evaluation  - heat and conservative treatment for his neck spasms and TENS unit and PT  - repeat formal memory testing  PRIOR:  -Formal neurocognitive testing revealed mild cognitive impairment. Patient did above average and superior on many of the aspects of the testing.However there were deficits in ability to learn and retain new verbal information, processing speed and verbal fluency were below expectation, auditory comprehension was impaired.  Testing did not correspond with MRI findings which showed moderately severe generalized cortical atrophy.  - MRi brain showed moderately severe generalized cortical atrophy but  this was unchanged from 2016   -B12 deficiency:  B12 wasextremely low less than 150, he continues to have injections will retest in several months  - Sleep Eval: was negative  -Patient will retest his formal neurocognitive exam in 1 year for Dr. Marion Downer  -Patient has difficulty with speech, slurred speech since his stroke, problems with auditory processing per neuropsych testing, refered to speech therapy in the past   Orders Placed This Encounter  Procedures    . Ambulatory referral to Physical Therapy  . Ambulatory referral to Neuropsychology   Cc: Dr. Lovena Le, MD  [facility] [address] [place], [place] [address]  Phone [phone] Fax [phone]  A total of 25 minutes was spent face-to-face with this patient. Over half this time was spent on counseling patient on the  1. Fall, initial encounter   2. Gait abnormality   3. Whiplash injury to neck, initial encounter   4. Late onset Alzheimer's disease without behavioral disturbance ([place])    diagnosis and different diagnostic and therapeutic options, counseling and coordination of care, risks ans benefits of management, compliance, or risk factor reduction and education.   Marland Kitchen

## 2018-07-28 NOTE — Patient Instructions (Addendum)
-   physical therapy for gait abnormality here at neuro rehab  - discussed limiting driving and will get Formal driving evaluation  - heat and conservative treatment for his neck spasms and TENS unit and PT  - repeat formal memory testing   Post-Concussion Syndrome  Post-concussion syndrome is when symptoms last longer than normal after a head injury. What are the signs or symptoms? After a head injury, you may:  Have headaches.  Feel tired.  Feel dizzy.  Feel weak.  Have trouble seeing.  Have trouble in bright lights.  Have trouble hearing.  Not be able to remember things.  Not be able to focus.  Have trouble sleeping.  Have mood swings.  Have trouble learning new things. These can last from weeks to months. Follow these instructions at home: Medicines  Take all medicines only as told by your doctor.  Do not take prescription pain medicines. Activity  Limit activities as told by your doctor. This includes: ? Homework. ? Job-related work. ? Thinking. ? Watching TV. ? Using a computer or phone. ? Puzzles. ? Exercise. ? Sports.  Slowly return to your normal activity as told by your doctor.  Stop an activity if you have symptoms.  Do not do anything that may cause you to get injured again. General instructions  Rest. Try to: ? Sleep 7-9 hours each night. ? Take naps or breaks when you feel tired during the day.  Do not drink alcohol until your doctor says that you can.  Keep track of your symptoms.  Keep all follow-up visits as told by your doctor. This is important. Contact a doctor if:  You do not improve.  You get worse.  You have another injury. Get help right away if:  You have a very bad headache.  You feel confused.  You feel very sleepy.  You pass out (faint).  You throw up (vomit).  You feel weak in any part of your body.  You feel numb in any part of your body.  You start shaking (have a seizure).  You have trouble  talking. Summary  Post-concussion syndrome is when symptoms last longer than normal after a head injury.  Limit all activity after your injury. Gradually return to normal activity as told by your doctor.  Rest, do not drink alcohol, and avoid prescription pain medicines after a concussion.  Call your doctor if your symptoms get worse. This information is not intended to replace advice given to you by your health care provider. Make sure you discuss any questions you have with your health care provider. Document Released: 07/11/2004 Document Revised: 07/08/2017 Document Reviewed: 07/08/2017 Elsevier Interactive Patient Education  2019 [employer].

## 2018-08-12 ENCOUNTER — Encounter (INDEPENDENT_AMBULATORY_CARE_PROVIDER_SITE_OTHER): Payer: Medicare HMO | Admitting: Ophthalmology

## 2018-08-18 ENCOUNTER — Other Ambulatory Visit: Payer: Self-pay | Admitting: Pharmacist

## 2018-08-18 ENCOUNTER — Other Ambulatory Visit: Payer: Self-pay

## 2018-08-18 ENCOUNTER — Telehealth: Payer: Self-pay

## 2018-08-18 MED ORDER — AMLODIPINE BESYLATE 5 MG PO TABS: 5.0000 mg | ORAL_TABLET | Freq: Every day | ORAL | 0 refills | Status: DC

## 2018-08-18 MED ORDER — APIXABAN 5 MG PO TABS: 5.0000 mg | ORAL_TABLET | Freq: Two times a day (BID) | ORAL | 0 refills | Status: DC

## 2018-08-18 NOTE — Telephone Encounter (Signed)
Pt pharmacy is requesting refill for ELIQUIS 5 MG TABLET BID please address thank you.

## 2018-08-24 DIAGNOSIS — M9901 Segmental and somatic dysfunction of cervical region: Secondary | ICD-10-CM | POA: Diagnosis not present

## 2018-08-24 DIAGNOSIS — M50322 Other cervical disc degeneration at C5-C6 level: Secondary | ICD-10-CM | POA: Diagnosis not present

## 2018-08-25 ENCOUNTER — Other Ambulatory Visit: Payer: Self-pay

## 2018-08-25 DIAGNOSIS — M9901 Segmental and somatic dysfunction of cervical region: Secondary | ICD-10-CM | POA: Diagnosis not present

## 2018-08-25 DIAGNOSIS — M5030 Other cervical disc degeneration, unspecified cervical region: Secondary | ICD-10-CM | POA: Diagnosis not present

## 2018-08-25 MED ORDER — CLOPIDOGREL BISULFATE 75 MG PO TABS: 75.0000 mg | ORAL_TABLET | Freq: Every day | ORAL | 0 refills | Status: DC

## 2018-08-26 DIAGNOSIS — M9901 Segmental and somatic dysfunction of cervical region: Secondary | ICD-10-CM | POA: Diagnosis not present

## 2018-08-26 DIAGNOSIS — M5030 Other cervical disc degeneration, unspecified cervical region: Secondary | ICD-10-CM | POA: Diagnosis not present

## 2018-08-27 ENCOUNTER — Encounter (INDEPENDENT_AMBULATORY_CARE_PROVIDER_SITE_OTHER): Payer: Medicare HMO | Admitting: Ophthalmology

## 2018-09-16 ENCOUNTER — Other Ambulatory Visit: Payer: Self-pay

## 2018-09-16 ENCOUNTER — Encounter (INDEPENDENT_AMBULATORY_CARE_PROVIDER_SITE_OTHER): Payer: Medicare HMO | Admitting: Ophthalmology

## 2018-09-16 DIAGNOSIS — I10 Essential (primary) hypertension: Secondary | ICD-10-CM | POA: Diagnosis not present

## 2018-09-16 DIAGNOSIS — H353231 Exudative age-related macular degeneration, bilateral, with active choroidal neovascularization: Secondary | ICD-10-CM

## 2018-09-16 DIAGNOSIS — H35033 Hypertensive retinopathy, bilateral: Secondary | ICD-10-CM

## 2018-09-16 DIAGNOSIS — H43813 Vitreous degeneration, bilateral: Secondary | ICD-10-CM

## 2018-09-23 ENCOUNTER — Other Ambulatory Visit: Payer: Self-pay | Admitting: Cardiology

## 2018-09-26 ENCOUNTER — Emergency Department (HOSPITAL_COMMUNITY): Payer: Medicare HMO

## 2018-09-26 ENCOUNTER — Inpatient Hospital Stay (HOSPITAL_COMMUNITY)
Admission: EM | Admit: 2018-09-26 | Discharge: 2018-10-03 | DRG: 470 | Disposition: A | Discharge status: Skilled Nursing Facility | Payer: Medicare HMO | Attending: Internal Medicine | Admitting: Internal Medicine

## 2018-09-26 ENCOUNTER — Encounter (HOSPITAL_COMMUNITY): Payer: Self-pay | Admitting: Emergency Medicine

## 2018-09-26 ENCOUNTER — Other Ambulatory Visit: Payer: Self-pay

## 2018-09-26 DIAGNOSIS — Z955 Presence of coronary angioplasty implant and graft: Secondary | ICD-10-CM | POA: Diagnosis not present

## 2018-09-26 DIAGNOSIS — S72001A Fracture of unspecified part of neck of right femur, initial encounter for closed fracture: Secondary | ICD-10-CM | POA: Diagnosis not present

## 2018-09-26 DIAGNOSIS — Z89022 Acquired absence of left finger(s): Secondary | ICD-10-CM

## 2018-09-26 DIAGNOSIS — Z7901 Long term (current) use of anticoagulants: Secondary | ICD-10-CM | POA: Diagnosis not present

## 2018-09-26 DIAGNOSIS — Z9861 Coronary angioplasty status: Secondary | ICD-10-CM

## 2018-09-26 DIAGNOSIS — S72091A Other fracture of head and neck of right femur, initial encounter for closed fracture: Principal | ICD-10-CM | POA: Diagnosis present

## 2018-09-26 DIAGNOSIS — I252 Old myocardial infarction: Secondary | ICD-10-CM | POA: Diagnosis not present

## 2018-09-26 DIAGNOSIS — G4733 Obstructive sleep apnea (adult) (pediatric): Secondary | ICD-10-CM | POA: Diagnosis not present

## 2018-09-26 DIAGNOSIS — S80211A Abrasion, right knee, initial encounter: Secondary | ICD-10-CM | POA: Diagnosis present

## 2018-09-26 DIAGNOSIS — Z825 Family history of asthma and other chronic lower respiratory diseases: Secondary | ICD-10-CM

## 2018-09-26 DIAGNOSIS — Z471 Aftercare following joint replacement surgery: Secondary | ICD-10-CM | POA: Diagnosis not present

## 2018-09-26 DIAGNOSIS — Z888 Allergy status to other drugs, medicaments and biological substances status: Secondary | ICD-10-CM | POA: Diagnosis not present

## 2018-09-26 DIAGNOSIS — S299XXA Unspecified injury of thorax, initial encounter: Secondary | ICD-10-CM | POA: Diagnosis not present

## 2018-09-26 DIAGNOSIS — Y92009 Unspecified place in unspecified non-institutional (private) residence as the place of occurrence of the external cause: Secondary | ICD-10-CM

## 2018-09-26 DIAGNOSIS — E785 Hyperlipidemia, unspecified: Secondary | ICD-10-CM | POA: Diagnosis not present

## 2018-09-26 DIAGNOSIS — D62 Acute posthemorrhagic anemia: Secondary | ICD-10-CM | POA: Diagnosis not present

## 2018-09-26 DIAGNOSIS — I10 Essential (primary) hypertension: Secondary | ICD-10-CM | POA: Diagnosis not present

## 2018-09-26 DIAGNOSIS — R2689 Other abnormalities of gait and mobility: Secondary | ICD-10-CM | POA: Diagnosis not present

## 2018-09-26 DIAGNOSIS — E861 Hypovolemia: Secondary | ICD-10-CM | POA: Diagnosis not present

## 2018-09-26 DIAGNOSIS — R609 Edema, unspecified: Secondary | ICD-10-CM | POA: Diagnosis not present

## 2018-09-26 DIAGNOSIS — I2510 Atherosclerotic heart disease of native coronary artery without angina pectoris: Secondary | ICD-10-CM | POA: Diagnosis present

## 2018-09-26 DIAGNOSIS — H269 Unspecified cataract: Secondary | ICD-10-CM | POA: Diagnosis present

## 2018-09-26 DIAGNOSIS — S72041A Displaced fracture of base of neck of right femur, initial encounter for closed fracture: Secondary | ICD-10-CM | POA: Diagnosis not present

## 2018-09-26 DIAGNOSIS — R278 Other lack of coordination: Secondary | ICD-10-CM | POA: Diagnosis not present

## 2018-09-26 DIAGNOSIS — Z79899 Other long term (current) drug therapy: Secondary | ICD-10-CM

## 2018-09-26 DIAGNOSIS — W19XXXA Unspecified fall, initial encounter: Secondary | ICD-10-CM | POA: Diagnosis not present

## 2018-09-26 DIAGNOSIS — I4510 Unspecified right bundle-branch block: Secondary | ICD-10-CM | POA: Diagnosis not present

## 2018-09-26 DIAGNOSIS — H3530 Unspecified macular degeneration: Secondary | ICD-10-CM | POA: Diagnosis present

## 2018-09-26 DIAGNOSIS — M6281 Muscle weakness (generalized): Secondary | ICD-10-CM | POA: Diagnosis not present

## 2018-09-26 DIAGNOSIS — R41841 Cognitive communication deficit: Secondary | ICD-10-CM | POA: Diagnosis not present

## 2018-09-26 DIAGNOSIS — R2681 Unsteadiness on feet: Secondary | ICD-10-CM | POA: Diagnosis not present

## 2018-09-26 DIAGNOSIS — Z7902 Long term (current) use of antithrombotics/antiplatelets: Secondary | ICD-10-CM | POA: Diagnosis not present

## 2018-09-26 DIAGNOSIS — N179 Acute kidney failure, unspecified: Secondary | ICD-10-CM | POA: Diagnosis not present

## 2018-09-26 DIAGNOSIS — S0990XA Unspecified injury of head, initial encounter: Secondary | ICD-10-CM | POA: Diagnosis not present

## 2018-09-26 DIAGNOSIS — Z9079 Acquired absence of other genital organ(s): Secondary | ICD-10-CM | POA: Diagnosis not present

## 2018-09-26 DIAGNOSIS — R52 Pain, unspecified: Secondary | ICD-10-CM | POA: Diagnosis not present

## 2018-09-26 DIAGNOSIS — I480 Paroxysmal atrial fibrillation: Secondary | ICD-10-CM | POA: Diagnosis not present

## 2018-09-26 DIAGNOSIS — R262 Difficulty in walking, not elsewhere classified: Secondary | ICD-10-CM | POA: Diagnosis not present

## 2018-09-26 DIAGNOSIS — S7291XA Unspecified fracture of right femur, initial encounter for closed fracture: Secondary | ICD-10-CM | POA: Diagnosis not present

## 2018-09-26 DIAGNOSIS — W010XXA Fall on same level from slipping, tripping and stumbling without subsequent striking against object, initial encounter: Secondary | ICD-10-CM | POA: Diagnosis present

## 2018-09-26 DIAGNOSIS — Z8546 Personal history of malignant neoplasm of prostate: Secondary | ICD-10-CM

## 2018-09-26 DIAGNOSIS — Z9181 History of falling: Secondary | ICD-10-CM | POA: Diagnosis not present

## 2018-09-26 DIAGNOSIS — R5381 Other malaise: Secondary | ICD-10-CM | POA: Diagnosis not present

## 2018-09-26 DIAGNOSIS — Z8673 Personal history of transient ischemic attack (TIA), and cerebral infarction without residual deficits: Secondary | ICD-10-CM

## 2018-09-26 DIAGNOSIS — M25551 Pain in right hip: Secondary | ICD-10-CM | POA: Diagnosis present

## 2018-09-26 DIAGNOSIS — M25519 Pain in unspecified shoulder: Secondary | ICD-10-CM | POA: Diagnosis not present

## 2018-09-26 DIAGNOSIS — R1311 Dysphagia, oral phase: Secondary | ICD-10-CM | POA: Diagnosis not present

## 2018-09-26 DIAGNOSIS — S80911A Unspecified superficial injury of right knee, initial encounter: Secondary | ICD-10-CM | POA: Diagnosis not present

## 2018-09-26 DIAGNOSIS — Z96641 Presence of right artificial hip joint: Secondary | ICD-10-CM

## 2018-09-26 DIAGNOSIS — M2550 Pain in unspecified joint: Secondary | ICD-10-CM | POA: Diagnosis not present

## 2018-09-26 DIAGNOSIS — Z7401 Bed confinement status: Secondary | ICD-10-CM | POA: Diagnosis not present

## 2018-09-26 LAB — CBC WITH DIFFERENTIAL/PLATELET
Abs Immature Granulocytes: 0.03 10*3/uL (ref 0.00–0.07)
Basophils Absolute: 0 10*3/uL (ref 0.0–0.1)
Basophils Relative: 1 %
Eosinophils Absolute: 0.2 10*3/uL (ref 0.0–0.5)
Eosinophils Relative: 4 %
HCT: 46.6 % (ref 39.0–52.0)
Hemoglobin: 14.8 g/dL (ref 13.0–17.0)
Immature Granulocytes: 1 %
Lymphocytes Relative: 15 %
Lymphs Abs: 0.9 10*3/uL (ref 0.7–4.0)
MCH: 27.6 pg (ref 26.0–34.0)
MCHC: 31.8 g/dL (ref 30.0–36.0)
MCV: 86.8 fL (ref 80.0–100.0)
Monocytes Absolute: 0.6 10*3/uL (ref 0.1–1.0)
Monocytes Relative: 11 %
Neutro Abs: 4 10*3/uL (ref 1.7–7.7)
Neutrophils Relative %: 68 %
Platelets: 161 10*3/uL (ref 150–400)
RBC: 5.37 MIL/uL (ref 4.22–5.81)
RDW: 14.6 % (ref 11.5–15.5)
WBC: 5.8 10*3/uL (ref 4.0–10.5)
nRBC: 0 % (ref 0.0–0.2)

## 2018-09-26 LAB — BASIC METABOLIC PANEL WITH GFR
Anion gap: 11 (ref 5–15)
BUN: 16 mg/dL (ref 8–23)
CO2: 24 mmol/L (ref 22–32)
Calcium: 9.4 mg/dL (ref 8.9–10.3)
Chloride: 105 mmol/L (ref 98–111)
Creatinine, Ser: 0.91 mg/dL (ref 0.61–1.24)
GFR calc Af Amer: >60 mL/min
GFR calc non Af Amer: >60 mL/min
Glucose, Bld: 101 mg/dL — ABNORMAL HIGH (ref 70–99)
Potassium: 3.8 mmol/L (ref 3.5–5.1)
Sodium: 140 mmol/L (ref 135–145)

## 2018-09-26 LAB — SURGICAL PCR SCREEN
MRSA, PCR: NEGATIVE
Staphylococcus aureus: NEGATIVE

## 2018-09-26 MED ORDER — ONDANSETRON HCL 4 MG/2ML IJ SOLN: 4.0000 mg | Freq: Four times a day (QID) | INTRAMUSCULAR | Status: DC | PRN

## 2018-09-26 MED ORDER — AMLODIPINE BESYLATE 5 MG PO TABS
5.0000 mg | ORAL_TABLET | Freq: Every day | ORAL | Status: DC
  Administered 2018-09-26 – 2018-10-03 (×7): 5 mg via ORAL
  Filled 2018-09-26 (×7): qty 1

## 2018-09-26 MED ORDER — SODIUM CHLORIDE 0.9 % IV SOLN: 250.0000 mL | INTRAVENOUS | Status: DC | PRN

## 2018-09-26 MED ORDER — SODIUM CHLORIDE 0.9% FLUSH
3.0000 mL | Freq: Two times a day (BID) | INTRAVENOUS | Status: DC
  Administered 2018-09-26 – 2018-10-03 (×12): 3 mL via INTRAVENOUS

## 2018-09-26 MED ORDER — ONDANSETRON HCL 4 MG PO TABS: 4.0000 mg | ORAL_TABLET | Freq: Four times a day (QID) | ORAL | Status: DC | PRN

## 2018-09-26 MED ORDER — ACETAMINOPHEN 325 MG PO TABS
650.0000 mg | ORAL_TABLET | Freq: Four times a day (QID) | ORAL | Status: DC | PRN
  Administered 2018-09-26 (×2): 650 mg via ORAL
  Filled 2018-09-26 (×2): qty 2

## 2018-09-26 MED ORDER — SODIUM CHLORIDE 0.9% FLUSH: 3.0000 mL | INTRAVENOUS | Status: DC | PRN

## 2018-09-26 MED ORDER — METOPROLOL SUCCINATE ER 25 MG PO TB24
75.0000 mg | ORAL_TABLET | Freq: Every day | ORAL | Status: DC
  Administered 2018-09-26 – 2018-10-03 (×7): 75 mg via ORAL
  Filled 2018-09-26 (×7): qty 3

## 2018-09-26 MED ORDER — ACETAMINOPHEN 650 MG RE SUPP: 650.0000 mg | Freq: Four times a day (QID) | RECTAL | Status: DC | PRN

## 2018-09-26 MED ORDER — OXYCODONE HCL 5 MG PO TABS
5.0000 mg | ORAL_TABLET | ORAL | Status: DC | PRN
  Administered 2018-09-26: 5 mg via ORAL
  Filled 2018-09-26: qty 1

## 2018-09-26 NOTE — H&P (Signed)
History and Physical    Dominic [name] LNL:[id number] DOB: 1934-04-18 DOA: 09/26/2018  PCP: Lawerance Cruel, MD  Patient coming from: Home   Chief Complaint: Fall    HPI: Dominic [name] is a 85 y.o. adult with medical history significant of atrial fibrillation, CAD, hyperlipidemia, OSA who presents after a fall at home.  He states that he was in the bathroom when he lost his balance and fell.  He denies any loss of consciousness or prodromal symptoms, did not hit his head.  Currently, he is feeling well, pain is well controlled.  He denies any recent illnesses such as fevers, chills, cough, chest pain, shortness of breath, abdominal pain, nausea, vomiting, diarrhea.  ED Course: CT head negative for acute abnormality.  Imaging revealed impacted right femoral neck fracture.  Orthopedic surgery consulted.  Review of Systems: As per HPI otherwise 10 point review of systems negative.   Past Medical History:  Diagnosis Date   Amputated finger    a. L index d/t to dog bite.   Atrial fibrillation with RVR ([place])    a. New onset diagnosed 11/20/2013, spont converted to NSR.   Cataracts, bilateral    Concussion    Coronary artery disease 09/2004, 04/2005, 02/2015   a. Stent to prox and mid RCA 08/2001. b. DES to RCA for ISR 08/2004. c. DES to [facility] for ISR 05/2005. d. Low risk nuc 10/2013 (done for CP in setting of new AF). e. PCI to the mid-RCA for in-stent restenosis with cutting balloon angioplasty f. PCI to an OM2 lesion   Dyslipidemia    a. Intol of statins.   Fracture acetabulum-closed ([place]) 2015   Fractured pelvis ([place]) 2015   Hypertension    Lung nodule    , right upper lobe   Macular degeneration    both eyes, receives shots in his eyes   Obstructive sleep apnea    OSA (obstructive sleep apnea)    Prostate cancer (HCC)    RBBB (right bundle branch block)     Past Surgical History:  Procedure Laterality Date   ANTERIOR CRUCIATE LIGAMENT REPAIR Right    CARDIAC  CATHETERIZATION N/A 03/10/2015   Procedure: Left Heart Cath and Coronary Angiography;  Surgeon: Lorretta Harp, MD;  Location: [place] [facility];  Service: Cardiovascular;  Laterality: N/A;   CARDIAC CATHETERIZATION  03/13/2015   Procedure: Coronary Stent Intervention;  Surgeon: Peter M Martinique, MD;  Location: [place] [facility];  Service: Cardiovascular;;   CARDIAC CATHETERIZATION  03/13/2015   Procedure: Intravascular Pressure Wire/FFR Study;  Surgeon: Peter M Martinique, MD;  Location: [place] [facility];  Service: Cardiovascular;;   CATARACT EXTRACTION Left 08/2017   CATARACT EXTRACTION Right 10/06/2017   CORONARY STENT PLACEMENT     L knee ligament replacement     PROSTATECTOMY     TONSILLECTOMY     TOOTH EXTRACTION       reports that she has never smoked. She has never used smokeless tobacco. She reports that she does not drink alcohol or use drugs.  Allergies  Allergen Reactions   Statins Other (See Comments)    Myalgia   Zetia [Ezetimibe] Other (See Comments)    myalgia    Family History  Problem Relation Age of Onset   Emphysema Mother    Cancer Father      Prior to Admission medications   Medication Sig Start Date End Date Taking? Authorizing Provider  amLODipine (NORVASC) 5 MG tablet TAKE 1 TABLET BY  MOUTH EVERY DAY 09/23/18   Martinique, Peter M, MD  apixaban (ELIQUIS) 5 MG TABS tablet Take 1 tablet (5 mg total) by mouth 2 (two) times daily. PLEASE SCHEDULE FOLLOW UP WITH CARDIOLOGIST PRIOR TO NEXT REFILL AUTHORIZATION. 08/18/18   Martinique, Peter M, MD  clopidogrel (PLAVIX) 75 MG tablet Take 1 tablet (75 mg total) by mouth daily. Please call and schedule an appt for further refills.. 1st attempt 08/25/18   Martinique, Peter M, MD  metoprolol succinate (TOPROL-XL) 25 MG 24 hr tablet Take 3 tablets (75 mg total) by mouth daily. 03/28/17   Martinique, Peter M, MD  Multiple Vitamins-Minerals (PRESERVISION AREDS) CAPS Take 1 capsule by mouth 2 (two) times daily.    [provider]  nitroGLYCERIN (NITROSTAT) 0.4 MG SL tablet PLACE ONE TABLET UNDER THE TONGUE EVERY 5 MINUTES AS NEEDED FOR CHEST PAIN 02/27/15   Darlin Coco, MD  NON FORMULARY as directed. tocotrienols    [provider]  Piracetam POWD Take 2 scoop by mouth daily.     [provider]  Ubiquinol 100 MG CAPS Take 100 mg by mouth 2 (two) times daily.     [provider]  VIGAMOX 0.5 % ophthalmic solution INSTILL ONE DROP INTO LEFT EYE 4 TIMES A DAY FOR 2 DAYS AFTER EACH MONTHLY EYE INJECTION 02/27/15   [provider]  vitamin B-12 (CYANOCOBALAMIN) 1000 MCG tablet Take 1,000 mcg by mouth daily.    [provider]    Physical Exam: Vitals:   09/26/18 0738 09/26/18 0745 09/26/18 0800 09/26/18 0926  BP:  (!) 145/62 (!) 155/62 (!) 175/78  Pulse:  (!) 56 (!) 55 82  Resp:  20 (!) 26 (!) 25  Temp:      TempSrc:      SpO2:  100% 94% 94%  Weight: 88 kg     Height: 5\' 11"  (1.803 m)        Constitutional: NAD, calm, comfortable Eyes: PERRL, lids and conjunctivae normal ENMT: Mucous membranes are moist. Posterior pharynx clear of any exudate or lesions.Normal dentition.  Neck: normal, supple, no masses, no thyromegaly Respiratory: clear to auscultation bilaterally, no wheezing, no crackles. Normal respiratory effort. No accessory muscle use.  Cardiovascular: Regular rate and rhythm, no murmurs / rubs / gallops.  Trace extremity edema.  Abdomen: no tenderness, no masses palpated.  Bowel sounds positive.  Musculoskeletal: no clubbing / cyanosis. Normal muscle tone.  Skin: no rashes, lesions, ulcers on exposed skin Neurologic: Nonfocal, speech is clear Psychiatric: Normal judgment and insight. Alert and oriented x 3. Normal mood.   Labs on Admission: I have personally reviewed following labs and imaging studies  CBC: Recent Labs  Lab 09/26/18 0754  WBC 5.8  NEUTROABS 4.0  HGB 14.8  HCT 46.6  MCV 86.8  PLT 701   Basic Metabolic  Panel: Recent Labs  Lab 09/26/18 0754  NA 140  K 3.8  CL 105  CO2 24  GLUCOSE 101*  BUN 16  CREATININE 0.91  CALCIUM 9.4   GFR: Estimated Creatinine Clearance (by C-G formula based on SCr of 0.91 mg/dL) Male: 58.5 mL/min Male: 66.7 mL/min Liver Function Tests: No results for input(s): AST, ALT, ALKPHOS, BILITOT, PROT, ALBUMIN in the last 168 hours. No results for input(s): LIPASE, AMYLASE in the last 168 hours. No results for input(s): AMMONIA in the last 168 hours. Coagulation Profile: No results for input(s): INR, PROTIME in the last 168 hours. Cardiac Enzymes: No results for input(s): CKTOTAL, CKMB, CKMBINDEX, TROPONINI  in the last 168 hours. BNP (last 3 results) No results for input(s): PROBNP in the last 8760 hours. HbA1C: No results for input(s): HGBA1C in the last 72 hours. CBG: No results for input(s): GLUCAP in the last 168 hours. Lipid Profile: No results for input(s): CHOL, HDL, LDLCALC, TRIG, CHOLHDL, LDLDIRECT in the last 72 hours. Thyroid Function Tests: No results for input(s): TSH, T4TOTAL, FREET4, T3FREE, THYROIDAB in the last 72 hours. Anemia Panel: No results for input(s): VITAMINB12, FOLATE, FERRITIN, TIBC, IRON, RETICCTPCT in the last 72 hours. Urine analysis:    Component Value Date/Time   COLORURINE YELLOW 02/15/2018 1316   APPEARANCEUR CLEAR 02/15/2018 1316   LABSPEC 1.010 02/15/2018 1316   PHURINE 7.0 02/15/2018 1316   GLUCOSEU NEGATIVE 02/15/2018 1316   HGBUR NEGATIVE 02/15/2018 1316   BILIRUBINUR NEGATIVE 02/15/2018 1316   KETONESUR NEGATIVE 02/15/2018 1316   PROTEINUR NEGATIVE 02/15/2018 1316   NITRITE NEGATIVE 02/15/2018 1316   LEUKOCYTESUR NEGATIVE 02/15/2018 1316   Sepsis Labs: !!!!!!!!!!!!!!!!!!!!!!!!!!!!!!!!!!!!!!!!!!!! @LABRCNTIP (procalcitonin:4,lacticidven:4) )No results found for this or any previous visit (from the past 240 hour(s)).   Radiological Exams on Admission: Dg Chest 1 View  Result Date: 09/26/2018 CLINICAL  DATA:  85 year old who fell early this morning while going to the bathroom, landing on his RIGHT side. Initial encounter. EXAM: CHEST  1 VIEW COMPARISON:  09/03/2017 and earlier. FINDINGS: AP supine examination was performed. Cardiac silhouette upper normal in size to slightly enlarged for AP technique, unchanged. Thoracic aorta mildly atherosclerotic, unchanged. Hilar and mediastinal contours otherwise unremarkable. Stable chronic elevation of the RIGHT hemidiaphragm and chronic scar/atelectasis involving the RIGHT lung base. Lungs otherwise clear. Normal pulmonary vascularity. No visible pleural effusions. IMPRESSION: 1. No acute cardiopulmonary disease. 2. Stable chronic elevation of the RIGHT hemidiaphragm and chronic scar/atelectasis involving the RIGHT lung base. Electronically Signed   By: Evangeline Dakin M.D.   On: 09/26/2018 09:21   Ct Head Wo Contrast  Result Date: 09/26/2018 CLINICAL DATA:  Head trauma.  Fall. EXAM: CT HEAD WITHOUT CONTRAST TECHNIQUE: Contiguous axial images were obtained from the base of the skull through the vertex without intravenous contrast. COMPARISON:  07/13/2018 head CT. FINDINGS: Brain: No evidence of parenchymal hemorrhage or extra-axial fluid collection. No mass lesion, mass effect, or midline shift. No CT evidence of acute infarction. Generalized cerebral volume loss. Nonspecific mild subcortical and periventricular white matter hypodensity, most in keeping with chronic small vessel ischemic change. Cerebral ventricle sizes are stable and concordant with the degree of cerebral volume loss. Vascular: No acute abnormality. Skull: No evidence of calvarial fracture. Sinuses/Orbits: No fluid levels. Mucoperiosteal thickening and mucous retention cysts versus polyps in inferior maxillary sinuses bilaterally. Other:  The mastoid air cells are unopacified. IMPRESSION: 1. No evidence of acute intracranial abnormality. No evidence of calvarial fracture. 2. Generalized cerebral  volume loss and chronic mild small vessel ischemic changes in the cerebral white matter. 3. Mild chronic paranasal sinusitis. Electronically Signed   By: Ilona Sorrel M.D.   On: 09/26/2018 08:57   Dg Knee Complete 4 Views Right  Result Date: 09/26/2018 CLINICAL DATA:  86 year old who fell earlier this morning while going to the bathroom, injuring the RIGHT knee. Prior RIGHT ANTERIOR cruciate ligament repair. Initial encounter. EXAM: RIGHT KNEE - COMPLETE 4+ VIEW COMPARISON:  11/16/2005. FINDINGS: Postsurgical changes related to the ACL repair. Mild prepatellar soft tissue swelling. No evidence of acute fracture or dislocation. Mild to moderate tricompartment joint space narrowing. Bone mineral density well preserved for patient age. Femoropopliteal and tibioperoneal artery  atherosclerosis. IMPRESSION: 1. No acute osseous abnormality. 2. Mild to moderate tricompartment osteoarthritis. Electronically Signed   By: Evangeline Dakin M.D.   On: 09/26/2018 09:24   Dg Hip Unilat With Pelvis 2-3 Views Right  Result Date: 09/26/2018 CLINICAL DATA:  Fall with right hip pain EXAM: DG HIP (WITH OR WITHOUT PELVIS) 2-3V RIGHT COMPARISON:  04/19/2014 CT abdomen/pelvis FINDINGS: Mildly comminuted and mildly impacted right mid femoral neck fracture with mild 4 mm lateral displacement of the dominant distal fracture fragment. No dislocation at the right hip joint. No pelvic diastasis. No suspicious focal osseous lesions. Surgical clips throughout the deep pelvis. IMPRESSION: Mildly displaced and mildly impacted right femoral neck fracture. Electronically Signed   By: Ilona Sorrel M.D.   On: 09/26/2018 09:24    EKG: Independently reviewed.  Normal sinus rhythm with bradycardia rate 54, with PAC, RBBB  Assessment/Plan Principal Problem:   Right femoral fracture (HCC) Active Problems:   CAD S/P percutaneous coronary angioplasty   Hypertension   PAF (paroxysmal atrial fibrillation) (HCC)   OSA (obstructive sleep  apnea)   Chronic anticoagulation   Right femoral neck fracture after mechanical fall at home -Orthopedic surgery consulted -[place] Safe periop cardiac risk: Estimated Risk Probability for Perioperative Myocardial Infarction or Cardiac Arrest 0.85 % -Pain control   Paroxysmal atrial fibrillation -Eliquis on hold - last dose midnight 4/11  -Continue Toprol  Essential hypertension -Continue Norvasc  CAD  -Followed by Dr. Martinique as an outpatient.  Has had prior stents in 2006 and 2016. -Plavix on hold -last dose noon 4/10   OSA -CPAP nightly   DVT prophylaxis: Eliquis on hold, SCD for prophylaxis Code Status: Full  Family Communication: Spoke with wife over the phone Disposition Plan: Pending surgical plan, will need PT OT Consults called: Orthopedic surgery Admission status: Inpatient  Severity of Illness: The appropriate patient status for this patient is INPATIENT. Inpatient status is judged to be reasonable and necessary in order to provide the required intensity of service to ensure the patient's safety. The patient's presenting symptoms, physical exam findings, and initial radiographic and laboratory data in the context of their chronic comorbidities is felt to place them at high risk for further clinical deterioration. Furthermore, it is not anticipated that the patient will be medically stable for discharge from the hospital within 2 midnights of admission. The following factors support the patient status of inpatient.   " The patient's presenting symptoms include fall at home. " The initial radiographic and laboratory data are worrisome because of right femoral neck fracture. " The chronic co-morbidities include atrial fibrillation, hypertension, CAD.   * I certify that at the point of admission it is my clinical judgment that the patient will require inpatient hospital care spanning beyond 2 midnights from the point of admission due to high intensity of service, high risk for  further deterioration and high frequency of surveillance required.Dessa Phi, DO [facility] 09/26/2018, 10:15 AM    How to contact the [facility] Attending or Consulting provider [place] or covering provider during after hours [place], for this patient?  1. Check the care team in [facility] and look for a) attending/consulting [facility] provider listed and b) the [facility] team listed 2. Log into [email or web address] and use [place]'s universal password to access. If you do not have the password, please contact the hospital operator. 3. Locate the [facility] provider you are looking for under [facility] and page to a number that you can be directly reached.  4. If you still have difficulty reaching the provider, please page the [facility] (Director on Call) for the Hospitalists listed on amion for assistance.

## 2018-09-26 NOTE — ED Notes (Signed)
MD at bediside.

## 2018-09-26 NOTE — ED Notes (Signed)
Lunch tray ordered 

## 2018-09-26 NOTE — Progress Notes (Signed)
Pt stated he does not use CPAP at home.  He does not wish to wear one while at the hospital.
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
## 2018-09-26 NOTE — ED Notes (Signed)
ED TO INPATIENT HANDOFF REPORT  ED Nurse Name and Phone #: Kathlee Nations [phone]  I Name/Age/Gender Dominic [name] 85 y.o. adult Room/Bed: 032C/032C  Code Status   Code Status: Full Code  Home/SNF/Other Home Patient oriented to: self, place, time and situation Is this baseline? Yes   Triage Complete: Triage complete  Chief Complaint Fall; Hip Deformity  Triage Note Pt arrives via PTAR from home with reports of falling when sitting on the toilet this morning. Pt endorses right hip and knee pain. Pt on eliquis but doe not think he hit his head. Pt A&Ox4.   Allergies Allergies  Allergen Reactions  . Statins Other (See Comments)    Myalgia  . Zetia [Ezetimibe] Other (See Comments)    myalgia    Level of Care/Admitting Diagnosis ED Disposition    ED Disposition Condition [facility] Area: [place] [[id number]]  Level of Care: Med-Surg [16]  Diagnosis: Right femoral fracture [facility]) [[id number]]  Admitting Physician: Dessa Phi [[id number]]  Attending Physician: Dessa Phi [phone]  Estimated length of stay: 3 - 4 days  Certification:: I certify this patient will need inpatient services for at least 2 midnights  PT Class (Do Not Modify): Inpatient [101]  PT Acc Code (Do Not Modify): Private [1]       B Medical/Surgery History Past Medical History:  Diagnosis Date  . Amputated finger    a. L index d/t to dog bite.  . Atrial fibrillation with RVR ([place])    a. New onset diagnosed 11/20/2013, spont converted to NSR.  Marland Kitchen Cataracts, bilateral   . Concussion   . Coronary artery disease 09/2004, 04/2005, 02/2015   a. Stent to prox and mid RCA 08/2001. b. DES to RCA for ISR 08/2004. c. DES to [facility] for ISR 05/2005. d. Low risk nuc 10/2013 (done for CP in setting of new AF). e. PCI to the mid-RCA for in-stent restenosis with cutting balloon angioplasty f. PCI to an OM2 lesion  . Dyslipidemia    a. Intol of statins.  . Fracture acetabulum-closed ([place]) 2015   . Fractured pelvis ([place]) 2015  . Hypertension   . Lung nodule    , right upper lobe  . Macular degeneration    both eyes, receives shots in his eyes  . Obstructive sleep apnea   . OSA (obstructive sleep apnea)   . Prostate cancer ([place])   . RBBB (right bundle branch block)    Past Surgical History:  Procedure Laterality Date  . ANTERIOR CRUCIATE LIGAMENT REPAIR Right   . CARDIAC CATHETERIZATION N/A 03/10/2015   Procedure: Left Heart Cath and Coronary Angiography;  Surgeon: Lorretta Harp, MD;  Location: [place] [facility];  Service: Cardiovascular;  Laterality: N/A;  . CARDIAC CATHETERIZATION  03/13/2015   Procedure: Coronary Stent Intervention;  Surgeon: Peter M Martinique, MD;  Location: [place] [facility];  Service: Cardiovascular;;  . CARDIAC CATHETERIZATION  03/13/2015   Procedure: Intravascular Pressure Wire/FFR Study;  Surgeon: Peter M Martinique, MD;  Location: [place] [facility];  Service: Cardiovascular;;  . CATARACT EXTRACTION Left 08/2017  . CATARACT EXTRACTION Right 10/06/2017  . CORONARY STENT PLACEMENT    . L knee ligament replacement    . PROSTATECTOMY    . TONSILLECTOMY    . TOOTH EXTRACTION       A IV Location/Drains/Wounds Patient Lines/Drains/Airways Status   Active Line/Drains/Airways    None          Intake/Output Last 24  hours No intake or output data in the 24 hours ending 09/26/18 1254  Labs/Imaging Results for orders placed or performed during the hospital encounter of 09/26/18 (from the past 48 hour(s))  CBC with Differential     Status: None   Collection Time: 09/26/18  7:54 AM  Result Value Ref Range   WBC 5.8 4.0 - 10.5 K/uL   RBC 5.37 4.22 - 5.81 MIL/uL   Hemoglobin 14.8 13.0 - 17.0 g/dL   HCT 46.6 39.0 - 52.0 %   MCV 86.8 80.0 - 100.0 fL   MCH 27.6 26.0 - 34.0 pg   MCHC 31.8 30.0 - 36.0 g/dL   RDW 14.6 11.5 - 15.5 %   Platelets 161 150 - 400 K/uL   nRBC 0.0 0.0 - 0.2 %   Neutrophils Relative % 68 %   Neutro Abs 4.0 1.7 - 7.7 K/uL    Lymphocytes Relative 15 %   Lymphs Abs 0.9 0.7 - 4.0 K/uL   Monocytes Relative 11 %   Monocytes Absolute 0.6 0.1 - 1.0 K/uL   Eosinophils Relative 4 %   Eosinophils Absolute 0.2 0.0 - 0.5 K/uL   Basophils Relative 1 %   Basophils Absolute 0.0 0.0 - 0.1 K/uL   Immature Granulocytes 1 %   Abs Immature Granulocytes 0.03 0.00 - 0.07 K/uL    Comment: Performed at [facility], [address] [place], [place] [address]  Basic metabolic panel     Status: Abnormal   Collection Time: 09/26/18  7:54 AM  Result Value Ref Range   Sodium 140 135 - 145 mmol/L   Potassium 3.8 3.5 - 5.1 mmol/L   Chloride 105 98 - 111 mmol/L   CO2 24 22 - 32 mmol/L   Glucose, Bld 101 (H) 70 - 99 mg/dL   BUN 16 8 - 23 mg/dL   Creatinine, Ser 0.91 0.61 - 1.24 mg/dL   Calcium 9.4 8.9 - 10.3 mg/dL   GFR calc non Af Amer >60 >60 mL/min   GFR calc Af Amer >60 >60 mL/min   Anion gap 11 5 - 15    Comment: Performed at [facility], [place] [address], [place],  [address]   Dg Chest 1 View  Result Date: 09/26/2018 CLINICAL DATA:  85 year old who fell early this morning while going to the bathroom, landing on his RIGHT side. Initial encounter. EXAM: CHEST  1 VIEW COMPARISON:  09/03/2017 and earlier. FINDINGS: AP supine examination was performed. Cardiac silhouette upper normal in size to slightly enlarged for AP technique, unchanged. Thoracic aorta mildly atherosclerotic, unchanged. Hilar and mediastinal contours otherwise unremarkable. Stable chronic elevation of the RIGHT hemidiaphragm and chronic scar/atelectasis involving the RIGHT lung base. Lungs otherwise clear. Normal pulmonary vascularity. No visible pleural effusions. IMPRESSION: 1. No acute cardiopulmonary disease. 2. Stable chronic elevation of the RIGHT hemidiaphragm and chronic scar/atelectasis involving the RIGHT lung base. Electronically Signed   By: Evangeline Dakin M.D.   On: 09/26/2018 09:21   Ct Head Wo Contrast  Result Date:  09/26/2018 CLINICAL DATA:  Head trauma.  Fall. EXAM: CT HEAD WITHOUT CONTRAST TECHNIQUE: Contiguous axial images were obtained from the base of the skull through the vertex without intravenous contrast. COMPARISON:  07/13/2018 head CT. FINDINGS: Brain: No evidence of parenchymal hemorrhage or extra-axial fluid collection. No mass lesion, mass effect, or midline shift. No CT evidence of acute infarction. Generalized cerebral volume loss. Nonspecific mild subcortical and periventricular white matter hypodensity, most in keeping with chronic small  vessel ischemic change. Cerebral ventricle sizes are stable and concordant with the degree of cerebral volume loss. Vascular: No acute abnormality. Skull: No evidence of calvarial fracture. Sinuses/Orbits: No fluid levels. Mucoperiosteal thickening and mucous retention cysts versus polyps in inferior maxillary sinuses bilaterally. Other:  The mastoid air cells are unopacified. IMPRESSION: 1. No evidence of acute intracranial abnormality. No evidence of calvarial fracture. 2. Generalized cerebral volume loss and chronic mild small vessel ischemic changes in the cerebral white matter. 3. Mild chronic paranasal sinusitis. Electronically Signed   By: Ilona Sorrel M.D.   On: 09/26/2018 08:57   Dg Knee Complete 4 Views Right  Result Date: 09/26/2018 CLINICAL DATA:  85 year old who fell earlier this morning while going to the bathroom, injuring the RIGHT knee. Prior RIGHT ANTERIOR cruciate ligament repair. Initial encounter. EXAM: RIGHT KNEE - COMPLETE 4+ VIEW COMPARISON:  11/16/2005. FINDINGS: Postsurgical changes related to the ACL repair. Mild prepatellar soft tissue swelling. No evidence of acute fracture or dislocation. Mild to moderate tricompartment joint space narrowing. Bone mineral density well preserved for patient age. Femoropopliteal and tibioperoneal artery atherosclerosis. IMPRESSION: 1. No acute osseous abnormality. 2. Mild to moderate tricompartment  osteoarthritis. Electronically Signed   By: Evangeline Dakin M.D.   On: 09/26/2018 09:24   Dg Hip Unilat With Pelvis 2-3 Views Right  Result Date: 09/26/2018 CLINICAL DATA:  Fall with right hip pain EXAM: DG HIP (WITH OR WITHOUT PELVIS) 2-3V RIGHT COMPARISON:  04/19/2014 CT abdomen/pelvis FINDINGS: Mildly comminuted and mildly impacted right mid femoral neck fracture with mild 4 mm lateral displacement of the dominant distal fracture fragment. No dislocation at the right hip joint. No pelvic diastasis. No suspicious focal osseous lesions. Surgical clips throughout the deep pelvis. IMPRESSION: Mildly displaced and mildly impacted right femoral neck fracture. Electronically Signed   By: Ilona Sorrel M.D.   On: 09/26/2018 09:24    Pending Labs Unresulted Labs (From admission, onward)    Start     Ordered   09/27/18 0500  CBC  Tomorrow morning,   R     09/26/18 1018   09/27/18 5053  Basic metabolic panel  Tomorrow morning,   R     09/26/18 1018          Vitals/Pain Today's Vitals   09/26/18 1035 09/26/18 1100 09/26/18 1132 09/26/18 1226  BP: (!) 164/75 (!) 160/79 (!) 147/72   Pulse: 100 87 86   Resp: 20 (!) 22 19   Temp:      TempSrc:      SpO2: 99% 96% 93%   Weight:      Height:      PainSc:   2  2     Isolation Precautions No active isolations  Medications Medications  amLODipine (NORVASC) tablet 5 mg (5 mg Oral Given 09/26/18 1033)  metoprolol succinate (TOPROL-XL) 24 hr tablet 75 mg (75 mg Oral Given 09/26/18 1030)  sodium chloride flush (NS) 0.9 % injection 3 mL (3 mLs Intravenous Given 09/26/18 1036)  sodium chloride flush (NS) 0.9 % injection 3 mL (has no administration in time range)  0.9 %  sodium chloride infusion (has no administration in time range)  acetaminophen (TYLENOL) tablet 650 mg (650 mg Oral Given 09/26/18 1104)    Or  acetaminophen (TYLENOL) suppository 650 mg ( Rectal See Alternative 09/26/18 1104)  oxyCODONE (Oxy IR/ROXICODONE) immediate release tablet 5  mg (has no administration in time range)  ondansetron (ZOFRAN) tablet 4 mg (has no administration in time range)  Or  ondansetron (ZOFRAN) injection 4 mg (has no administration in time range)    Mobility walks High fall risk   Focused Assessments Musculoskeletal/ fractured hip   R Recommendations: See Admitting Provider Note  Report given to:   Additional Notes: Pt normally walks at home. He has no teeth and needs soft diet.

## 2018-09-26 NOTE — ED Notes (Signed)
Patient transported to CT 

## 2018-09-26 NOTE — ED Triage Notes (Signed)
Pt arrives via PTAR from home with reports of falling when sitting on the toilet this morning. Pt endorses right hip and knee pain. Pt on eliquis but doe not think he hit his head. Pt A&Ox4.

## 2018-09-26 NOTE — Consult Note (Signed)
Reason for Consult:  Right hip fracture Referring Physician:  Ronnald Nian, DO (EDP)  Dominic [name] is an 85 y.o. adult.  HPI: The patient is a 85 year old gentleman who sustained an accidental mechanical fall this morning at home.  He injured his right hip and had the inability to ambulate afterwards.  He was brought via EMS to the [facility] emergency room with significant right hip pain.  X-rays confirm a displaced femoral neck fracture.  He only reports right hip pain.  He otherwise is a very active individual.  He is being seen by triad hospitalists for medical admission.  He does have multiple medical issues with the most significant being coronary artery disease.  He does take Eliquis and Plavix and had his last dose of Eliquis last evening.  Past Medical History:  Diagnosis Date  . Amputated finger    a. L index d/t to dog bite.  . Atrial fibrillation with RVR ([place])    a. New onset diagnosed 11/20/2013, spont converted to NSR.  Marland Kitchen Cataracts, bilateral   . Concussion   . Coronary artery disease 09/2004, 04/2005, 02/2015   a. Stent to prox and mid RCA 08/2001. b. DES to RCA for ISR 08/2004. c. DES to [facility] for ISR 05/2005. d. Low risk nuc 10/2013 (done for CP in setting of new AF). e. PCI to the mid-RCA for in-stent restenosis with cutting balloon angioplasty f. PCI to an OM2 lesion  . Dyslipidemia    a. Intol of statins.  . Fracture acetabulum-closed ([place]) 2015  . Fractured pelvis ([place]) 2015  . Hypertension   . Lung nodule    , right upper lobe  . Macular degeneration    both eyes, receives shots in his eyes  . Obstructive sleep apnea   . OSA (obstructive sleep apnea)   . Prostate cancer ([place])   . RBBB (right bundle branch block)     Past Surgical History:  Procedure Laterality Date  . ANTERIOR CRUCIATE LIGAMENT REPAIR Right   . CARDIAC CATHETERIZATION N/A 03/10/2015   Procedure: Left Heart Cath and Coronary Angiography;  Surgeon: Lorretta Harp, MD;  Location: [place] [facility];   Service: Cardiovascular;  Laterality: N/A;  . CARDIAC CATHETERIZATION  03/13/2015   Procedure: Coronary Stent Intervention;  Surgeon: Peter M Martinique, MD;  Location: [place] [facility];  Service: Cardiovascular;;  . CARDIAC CATHETERIZATION  03/13/2015   Procedure: Intravascular Pressure Wire/FFR Study;  Surgeon: Peter M Martinique, MD;  Location: [place] [facility];  Service: Cardiovascular;;  . CATARACT EXTRACTION Left 08/2017  . CATARACT EXTRACTION Right 10/06/2017  . CORONARY STENT PLACEMENT    . L knee ligament replacement    . PROSTATECTOMY    . TONSILLECTOMY    . TOOTH EXTRACTION      Family History  Problem Relation Age of Onset  . Emphysema Mother   . Cancer Father     Social History:  reports that she has never smoked. She has never used smokeless tobacco. She reports that she does not drink alcohol or use drugs.  Allergies:  Allergies  Allergen Reactions  . Statins Other (See Comments)    Myalgia  . Zetia [Ezetimibe] Other (See Comments)    myalgia    Medications: I have reviewed the patient's current medications.  Results for orders placed or performed during the hospital encounter of 09/26/18 (from the past 48 hour(s))  CBC with Differential     Status: None   Collection Time: 09/26/18  7:54 AM  Result Value Ref Range   WBC 5.8 4.0 - 10.5 K/uL   RBC 5.37 4.22 - 5.81 MIL/uL   Hemoglobin 14.8 13.0 - 17.0 g/dL   HCT 46.6 39.0 - 52.0 %   MCV 86.8 80.0 - 100.0 fL   MCH 27.6 26.0 - 34.0 pg   MCHC 31.8 30.0 - 36.0 g/dL   RDW 14.6 11.5 - 15.5 %   Platelets 161 150 - 400 K/uL   nRBC 0.0 0.0 - 0.2 %   Neutrophils Relative % 68 %   Neutro Abs 4.0 1.7 - 7.7 K/uL   Lymphocytes Relative 15 %   Lymphs Abs 0.9 0.7 - 4.0 K/uL   Monocytes Relative 11 %   Monocytes Absolute 0.6 0.1 - 1.0 K/uL   Eosinophils Relative 4 %   Eosinophils Absolute 0.2 0.0 - 0.5 K/uL   Basophils Relative 1 %   Basophils Absolute 0.0 0.0 - 0.1 K/uL   Immature Granulocytes 1 %   Abs Immature  Granulocytes 0.03 0.00 - 0.07 K/uL    Comment: Performed at [facility], [address], [place], [place] [address]  Basic metabolic panel     Status: Abnormal   Collection Time: 09/26/18  7:54 AM  Result Value Ref Range   Sodium 140 135 - 145 mmol/L   Potassium 3.8 3.5 - 5.1 mmol/L   Chloride 105 98 - 111 mmol/L   CO2 24 22 - 32 mmol/L   Glucose, Bld 101 (H) 70 - 99 mg/dL   BUN 16 8 - 23 mg/dL   Creatinine, Ser 0.91 0.61 - 1.24 mg/dL   Calcium 9.4 8.9 - 10.3 mg/dL   GFR calc non Af Amer >60 >60 mL/min   GFR calc Af Amer >60 >60 mL/min   Anion gap 11 5 - 15    Comment: Performed at [facility], [place] [address] [place], [place] [address]    Dg Chest 1 View  Result Date: 09/26/2018 CLINICAL DATA:  85 year old who fell early this morning while going to the bathroom, landing on his RIGHT side. Initial encounter. EXAM: CHEST  1 VIEW COMPARISON:  09/03/2017 and earlier. FINDINGS: AP supine examination was performed. Cardiac silhouette upper normal in size to slightly enlarged for AP technique, unchanged. Thoracic aorta mildly atherosclerotic, unchanged. Hilar and mediastinal contours otherwise unremarkable. Stable chronic elevation of the RIGHT hemidiaphragm and chronic scar/atelectasis involving the RIGHT lung base. Lungs otherwise clear. Normal pulmonary vascularity. No visible pleural effusions. IMPRESSION: 1. No acute cardiopulmonary disease. 2. Stable chronic elevation of the RIGHT hemidiaphragm and chronic scar/atelectasis involving the RIGHT lung base. Electronically Signed   By: Evangeline Dakin M.D.   On: 09/26/2018 09:21   Ct Head Wo Contrast  Result Date: 09/26/2018 CLINICAL DATA:  Head trauma.  Fall. EXAM: CT HEAD WITHOUT CONTRAST TECHNIQUE: Contiguous axial images were obtained from the base of the skull through the vertex without intravenous contrast. COMPARISON:  07/13/2018 head CT. FINDINGS: Brain: No evidence of parenchymal hemorrhage or extra-axial fluid collection.  No mass lesion, mass effect, or midline shift. No CT evidence of acute infarction. Generalized cerebral volume loss. Nonspecific mild subcortical and periventricular white matter hypodensity, most in keeping with chronic small vessel ischemic change. Cerebral ventricle sizes are stable and concordant with the degree of cerebral volume loss. Vascular: No acute abnormality. Skull: No evidence of calvarial fracture. Sinuses/Orbits: No fluid levels. Mucoperiosteal thickening and mucous retention cysts versus polyps in inferior maxillary sinuses bilaterally. Other:  The mastoid air cells  are unopacified. IMPRESSION: 1. No evidence of acute intracranial abnormality. No evidence of calvarial fracture. 2. Generalized cerebral volume loss and chronic mild small vessel ischemic changes in the cerebral white matter. 3. Mild chronic paranasal sinusitis. Electronically Signed   By: Ilona Sorrel M.D.   On: 09/26/2018 08:57   Dg Knee Complete 4 Views Right  Result Date: 09/26/2018 CLINICAL DATA:  85 year old who fell earlier this morning while going to the bathroom, injuring the RIGHT knee. Prior RIGHT ANTERIOR cruciate ligament repair. Initial encounter. EXAM: RIGHT KNEE - COMPLETE 4+ VIEW COMPARISON:  11/16/2005. FINDINGS: Postsurgical changes related to the ACL repair. Mild prepatellar soft tissue swelling. No evidence of acute fracture or dislocation. Mild to moderate tricompartment joint space narrowing. Bone mineral density well preserved for patient age. Femoropopliteal and tibioperoneal artery atherosclerosis. IMPRESSION: 1. No acute osseous abnormality. 2. Mild to moderate tricompartment osteoarthritis. Electronically Signed   By: Evangeline Dakin M.D.   On: 09/26/2018 09:24   Dg Hip Unilat With Pelvis 2-3 Views Right  Result Date: 09/26/2018 CLINICAL DATA:  Fall with right hip pain EXAM: DG HIP (WITH OR WITHOUT PELVIS) 2-3V RIGHT COMPARISON:  04/19/2014 CT abdomen/pelvis FINDINGS: Mildly comminuted and mildly  impacted right mid femoral neck fracture with mild 4 mm lateral displacement of the dominant distal fracture fragment. No dislocation at the right hip joint. No pelvic diastasis. No suspicious focal osseous lesions. Surgical clips throughout the deep pelvis. IMPRESSION: Mildly displaced and mildly impacted right femoral neck fracture. Electronically Signed   By: Ilona Sorrel M.D.   On: 09/26/2018 09:24   Independent review of the x-rays of his pelvis and right hip show displaced femoral neck fracture.  ROS Blood pressure (!) 175/78, pulse 82, temperature 98.7 F (37.1 C), temperature source Oral, resp. rate (!) 25, height 5\' 11"  (1.803 m), weight 88 kg, SpO2 94 %. Physical Exam  Constitutional: She is oriented to person, place, and time. She appears well-developed and well-nourished.  HENT:  Head: Normocephalic and atraumatic.  Eyes: Pupils are equal, round, and reactive to light.  Neck: Normal range of motion.  Cardiovascular: Normal rate.  Respiratory: Effort normal.  GI: Soft.  Musculoskeletal:     Right hip: She exhibits decreased range of motion, decreased strength, tenderness and bony tenderness.  Neurological: She is alert and oriented to person, place, and time.  Skin: Skin is warm and dry.  Psychiatric: She has a normal mood and affect.   His right lower extremity is shortened and externally rotated consistent with a displaced femoral neck fracture  Assessment/Plan: Right hip with displaced femoral neck fracture  He is graciously being admitted to the hospitalist service for medical management.  I am recommending a right total hip arthroplasty versus a right hip hemiarthroplasty to treat his displaced femoral neck fracture.  I had a long and thorough discussion with him about the surgery as well as a discussion about the risk and benefits of surgery.  We will delay surgery for 24 hours given that he did take his blood thinning medication yesterday.  He understands this will likely  need to be done under general anesthesia given that he has been on Eliquis and Plavix.  All question concerns were answered and addressed.  We will plan on surgery hopefully for tomorrow.  He can eat from my standpoint today and will need to be n.p.o. after midnight tonight.  Mcarthur Rossetti 09/26/2018, 10:07 AM

## 2018-09-26 NOTE — Plan of Care (Signed)
  Problem: Pain Management: Goal: Pain level will decrease Outcome: Progressing   

## 2018-09-27 ENCOUNTER — Inpatient Hospital Stay (HOSPITAL_COMMUNITY): Payer: Medicare HMO

## 2018-09-27 ENCOUNTER — Inpatient Hospital Stay (HOSPITAL_COMMUNITY): Payer: Medicare HMO | Admitting: Anesthesiology

## 2018-09-27 ENCOUNTER — Encounter (HOSPITAL_COMMUNITY)
Admission: EM | Disposition: A | Discharge status: Skilled Nursing Facility | Payer: Self-pay | Source: Home / Self Care | Attending: Family Medicine

## 2018-09-27 HISTORY — PX: TOTAL HIP ARTHROPLASTY: SHX124

## 2018-09-27 LAB — CBC
HCT: 42.3 % (ref 39.0–52.0)
Hemoglobin: 14.1 g/dL (ref 13.0–17.0)
MCH: 28.3 pg (ref 26.0–34.0)
MCHC: 33.3 g/dL (ref 30.0–36.0)
MCV: 84.9 fL (ref 80.0–100.0)
Platelets: 161 10*3/uL (ref 150–400)
RBC: 4.98 MIL/uL (ref 4.22–5.81)
RDW: 14.6 % (ref 11.5–15.5)
WBC: 9.1 10*3/uL (ref 4.0–10.5)
nRBC: 0 % (ref 0.0–0.2)

## 2018-09-27 LAB — BASIC METABOLIC PANEL
Anion gap: 10 (ref 5–15)
BUN: 19 mg/dL (ref 8–23)
CO2: 22 mmol/L (ref 22–32)
Calcium: 9.1 mg/dL (ref 8.9–10.3)
Chloride: 109 mmol/L (ref 98–111)
Creatinine, Ser: 0.93 mg/dL (ref 0.61–1.24)
GFR calc Af Amer: >60 mL/min (ref >60)
GFR calc non Af Amer: >60 mL/min (ref >60)
Glucose, Bld: 113 mg/dL — ABNORMAL HIGH (ref 70–99)
Potassium: 3.8 mmol/L (ref 3.5–5.1)
Sodium: 141 mmol/L (ref 135–145)

## 2018-09-27 SURGERY — ARTHROPLASTY, HIP, TOTAL, ANTERIOR APPROACH
Anesthesia: General | Site: Hip | Laterality: Right

## 2018-09-27 MED ORDER — SUCCINYLCHOLINE CHLORIDE 200 MG/10ML IV SOSY
PREFILLED_SYRINGE | INTRAVENOUS | Status: AC
  Filled 2018-09-27: qty 10

## 2018-09-27 MED ORDER — METOCLOPRAMIDE HCL 5 MG PO TABS: 5.0000 mg | ORAL_TABLET | Freq: Three times a day (TID) | ORAL | Status: DC | PRN

## 2018-09-27 MED ORDER — OXYCODONE HCL 5 MG PO TABS: 5.0000 mg | ORAL_TABLET | Freq: Once | ORAL | Status: DC | PRN

## 2018-09-27 MED ORDER — 0.9 % SODIUM CHLORIDE (POUR BTL) OPTIME
TOPICAL | Status: DC | PRN
  Administered 2018-09-27: 1000 mL

## 2018-09-27 MED ORDER — ACETAMINOPHEN 10 MG/ML IV SOLN
INTRAVENOUS | Status: DC | PRN
  Administered 2018-09-27: 1000 mg via INTRAVENOUS

## 2018-09-27 MED ORDER — METOCLOPRAMIDE HCL 5 MG/ML IJ SOLN: 5.0000 mg | Freq: Three times a day (TID) | INTRAMUSCULAR | Status: DC | PRN

## 2018-09-27 MED ORDER — CEFAZOLIN SODIUM-DEXTROSE 2-4 GM/100ML-% IV SOLN
2.0000 g | Freq: Four times a day (QID) | INTRAVENOUS | Status: AC
  Administered 2018-09-27 (×2): 2 g via INTRAVENOUS
  Filled 2018-09-27 (×2): qty 100

## 2018-09-27 MED ORDER — CEFAZOLIN SODIUM-DEXTROSE 2-3 GM-%(50ML) IV SOLR
INTRAVENOUS | Status: DC | PRN
  Administered 2018-09-27: 2 g via INTRAVENOUS

## 2018-09-27 MED ORDER — FENTANYL CITRATE (PF) 250 MCG/5ML IJ SOLN
INTRAMUSCULAR | Status: AC
  Filled 2018-09-27: qty 5

## 2018-09-27 MED ORDER — METHOCARBAMOL 1000 MG/10ML IJ SOLN
500.0000 mg | Freq: Four times a day (QID) | INTRAVENOUS | Status: DC | PRN
  Filled 2018-09-27: qty 5

## 2018-09-27 MED ORDER — FENTANYL CITRATE (PF) 100 MCG/2ML IJ SOLN
INTRAMUSCULAR | Status: AC
  Filled 2018-09-27: qty 2

## 2018-09-27 MED ORDER — CLOPIDOGREL BISULFATE 75 MG PO TABS
75.0000 mg | ORAL_TABLET | Freq: Every day | ORAL | Status: DC
  Administered 2018-09-28 – 2018-10-03 (×6): 75 mg via ORAL
  Filled 2018-09-27 (×6): qty 1

## 2018-09-27 MED ORDER — ONDANSETRON HCL 4 MG/2ML IJ SOLN: 4.0000 mg | Freq: Four times a day (QID) | INTRAMUSCULAR | Status: DC | PRN

## 2018-09-27 MED ORDER — SUCCINYLCHOLINE CHLORIDE 200 MG/10ML IV SOSY
PREFILLED_SYRINGE | INTRAVENOUS | Status: DC | PRN
  Administered 2018-09-27: 80 mg via INTRAVENOUS

## 2018-09-27 MED ORDER — OXYCODONE HCL 5 MG/5ML PO SOLN: 5.0000 mg | Freq: Once | ORAL | Status: DC | PRN

## 2018-09-27 MED ORDER — ONDANSETRON HCL 4 MG/2ML IJ SOLN
INTRAMUSCULAR | Status: AC
  Filled 2018-09-27: qty 2

## 2018-09-27 MED ORDER — SODIUM CHLORIDE 0.9 % IR SOLN
Status: DC | PRN
  Administered 2018-09-27: 3000 mL

## 2018-09-27 MED ORDER — PHENOL 1.4 % MT LIQD: 1.0000 | OROMUCOSAL | Status: DC | PRN

## 2018-09-27 MED ORDER — ACETAMINOPHEN 160 MG/5ML PO SOLN: 1000.0000 mg | Freq: Once | ORAL | Status: DC | PRN

## 2018-09-27 MED ORDER — ACETAMINOPHEN 500 MG PO TABS: 1000.0000 mg | ORAL_TABLET | Freq: Once | ORAL | Status: DC | PRN

## 2018-09-27 MED ORDER — HYDROCODONE-ACETAMINOPHEN 7.5-325 MG PO TABS
1.0000 | ORAL_TABLET | ORAL | Status: DC | PRN
  Administered 2018-10-01 – 2018-10-03 (×4): 1 via ORAL
  Filled 2018-09-27 (×4): qty 1

## 2018-09-27 MED ORDER — ROCURONIUM BROMIDE 50 MG/5ML IV SOSY
PREFILLED_SYRINGE | INTRAVENOUS | Status: AC
  Filled 2018-09-27: qty 5

## 2018-09-27 MED ORDER — APIXABAN 5 MG PO TABS
5.0000 mg | ORAL_TABLET | Freq: Two times a day (BID) | ORAL | Status: DC
  Administered 2018-09-28 – 2018-10-03 (×11): 5 mg via ORAL
  Filled 2018-09-27 (×11): qty 1

## 2018-09-27 MED ORDER — ALBUMIN HUMAN 5 % IV SOLN
INTRAVENOUS | Status: DC | PRN
  Administered 2018-09-27: 09:00:00 via INTRAVENOUS

## 2018-09-27 MED ORDER — ACETAMINOPHEN 10 MG/ML IV SOLN: 1000.0000 mg | Freq: Once | INTRAVENOUS | Status: DC | PRN

## 2018-09-27 MED ORDER — DEXAMETHASONE SODIUM PHOSPHATE 10 MG/ML IJ SOLN
INTRAMUSCULAR | Status: AC
  Filled 2018-09-27: qty 1

## 2018-09-27 MED ORDER — MENTHOL 3 MG MT LOZG: 1.0000 | LOZENGE | OROMUCOSAL | Status: DC | PRN

## 2018-09-27 MED ORDER — MORPHINE SULFATE (PF) 2 MG/ML IV SOLN: 0.5000 mg | INTRAVENOUS | Status: DC | PRN

## 2018-09-27 MED ORDER — ROCURONIUM BROMIDE 50 MG/5ML IV SOSY
PREFILLED_SYRINGE | INTRAVENOUS | Status: DC | PRN
  Administered 2018-09-27: 50 mg via INTRAVENOUS
  Administered 2018-09-27: 20 mg via INTRAVENOUS

## 2018-09-27 MED ORDER — ACETAMINOPHEN 10 MG/ML IV SOLN
INTRAVENOUS | Status: AC
  Filled 2018-09-27: qty 100

## 2018-09-27 MED ORDER — PROPOFOL 10 MG/ML IV BOLUS
INTRAVENOUS | Status: AC
  Filled 2018-09-27: qty 20

## 2018-09-27 MED ORDER — DOCUSATE SODIUM 100 MG PO CAPS
100.0000 mg | ORAL_CAPSULE | Freq: Two times a day (BID) | ORAL | Status: DC
  Administered 2018-09-27 – 2018-10-03 (×11): 100 mg via ORAL
  Filled 2018-09-27 (×11): qty 1

## 2018-09-27 MED ORDER — ACETAMINOPHEN 325 MG PO TABS
325.0000 mg | ORAL_TABLET | Freq: Four times a day (QID) | ORAL | Status: DC | PRN
  Administered 2018-10-03: 650 mg via ORAL
  Filled 2018-09-27 (×2): qty 2

## 2018-09-27 MED ORDER — FENTANYL CITRATE (PF) 100 MCG/2ML IJ SOLN
25.0000 ug | INTRAMUSCULAR | Status: DC | PRN
  Administered 2018-09-27: 50 ug via INTRAVENOUS

## 2018-09-27 MED ORDER — FENTANYL CITRATE (PF) 100 MCG/2ML IJ SOLN
INTRAMUSCULAR | Status: DC | PRN
  Administered 2018-09-27 (×3): 50 ug via INTRAVENOUS
  Administered 2018-09-27: 100 ug via INTRAVENOUS

## 2018-09-27 MED ORDER — ONDANSETRON HCL 4 MG PO TABS
4.0000 mg | ORAL_TABLET | Freq: Four times a day (QID) | ORAL | Status: DC | PRN
  Administered 2018-10-03: 4 mg via ORAL
  Filled 2018-09-27: qty 1

## 2018-09-27 MED ORDER — LIDOCAINE 2% (20 MG/ML) 5 ML SYRINGE
INTRAMUSCULAR | Status: AC
  Filled 2018-09-27: qty 5

## 2018-09-27 MED ORDER — METHOCARBAMOL 500 MG PO TABS: 500.0000 mg | ORAL_TABLET | Freq: Four times a day (QID) | ORAL | Status: DC | PRN

## 2018-09-27 MED ORDER — LACTATED RINGERS IV SOLN
INTRAVENOUS | Status: DC | PRN
  Administered 2018-09-27: 07:00:00 via INTRAVENOUS

## 2018-09-27 MED ORDER — PANTOPRAZOLE SODIUM 40 MG PO TBEC
40.0000 mg | DELAYED_RELEASE_TABLET | Freq: Every day | ORAL | Status: DC
  Administered 2018-09-27 – 2018-10-03 (×7): 40 mg via ORAL
  Filled 2018-09-27 (×7): qty 1

## 2018-09-27 MED ORDER — ONDANSETRON HCL 4 MG/2ML IJ SOLN
INTRAMUSCULAR | Status: DC | PRN
  Administered 2018-09-27: 4 mg via INTRAVENOUS

## 2018-09-27 MED ORDER — DEXAMETHASONE SODIUM PHOSPHATE 10 MG/ML IJ SOLN
INTRAMUSCULAR | Status: DC | PRN
  Administered 2018-09-27: 5 mg via INTRAVENOUS

## 2018-09-27 MED ORDER — PROPOFOL 10 MG/ML IV BOLUS
INTRAVENOUS | Status: DC | PRN
  Administered 2018-09-27: 110 mg via INTRAVENOUS

## 2018-09-27 MED ORDER — HYDROCODONE-ACETAMINOPHEN 5-325 MG PO TABS
1.0000 | ORAL_TABLET | ORAL | Status: DC | PRN
  Administered 2018-09-27: 2 via ORAL
  Administered 2018-09-29 – 2018-10-01 (×2): 1 via ORAL
  Filled 2018-09-27: qty 1
  Filled 2018-09-27: qty 2
  Filled 2018-09-27: qty 1

## 2018-09-27 MED ORDER — LIDOCAINE 2% (20 MG/ML) 5 ML SYRINGE
INTRAMUSCULAR | Status: DC | PRN
  Administered 2018-09-27: 60 mg via INTRAVENOUS

## 2018-09-27 SURGICAL SUPPLY — 58 items
ACETAB CUP W/GRIPTION 54 (Plate) ×2 IMPLANT
APL SKNCLS STERI-STRIP NONHPOA (GAUZE/BANDAGES/DRESSINGS)
ARTICULEZE HEAD (Hips) ×2 IMPLANT
BENZOIN TINCTURE PRP APPL 2/3 (GAUZE/BANDAGES/DRESSINGS) ×1 IMPLANT
BLADE CLIPPER SURG (BLADE) IMPLANT
BLADE SAW SGTL 18X1.27X75 (BLADE) ×2 IMPLANT
COVER SURGICAL LIGHT HANDLE (MISCELLANEOUS) ×2 IMPLANT
COVER WAND RF STERILE (DRAPES) ×2 IMPLANT
CUP ACETAB W/GRIPTION 54 (Plate) IMPLANT
DRAPE C-ARM 42X72 X-RAY (DRAPES) ×2 IMPLANT
DRAPE STERI IOBAN 125X83 (DRAPES) ×2 IMPLANT
DRAPE U-SHAPE 47X51 STRL (DRAPES) ×6 IMPLANT
DRESSING AQUACEL AG SP 3.5X10 (GAUZE/BANDAGES/DRESSINGS) IMPLANT
DRSG AQUACEL AG SP 3.5X10 (GAUZE/BANDAGES/DRESSINGS) ×2
ELECT BLADE 4.0 EZ CLEAN MEGAD (MISCELLANEOUS) ×2
ELECT BLADE 6.5 EXT (BLADE) IMPLANT
ELECT REM PT RETURN 9FT ADLT (ELECTROSURGICAL) ×2
ELECTRODE BLDE 4.0 EZ CLN MEGD (MISCELLANEOUS) ×1 IMPLANT
ELECTRODE REM PT RTRN 9FT ADLT (ELECTROSURGICAL) ×1 IMPLANT
FACESHIELD WRAPAROUND (MASK) ×4 IMPLANT
FACESHIELD WRAPAROUND OR TEAM (MASK) ×2 IMPLANT
GAUZE XEROFORM 5X9 LF (GAUZE/BANDAGES/DRESSINGS) ×1 IMPLANT
GLOVE BIOGEL PI IND STRL 8 (GLOVE) ×2 IMPLANT
GLOVE BIOGEL PI INDICATOR 8 (GLOVE) ×2
GLOVE ECLIPSE 8.0 STRL XLNG CF (GLOVE) ×3 IMPLANT
GLOVE ORTHO TXT STRL SZ7.5 (GLOVE) ×4 IMPLANT
GOWN STRL REUS W/ TWL LRG LVL3 (GOWN DISPOSABLE) ×2 IMPLANT
GOWN STRL REUS W/ TWL XL LVL3 (GOWN DISPOSABLE) ×2 IMPLANT
GOWN STRL REUS W/TWL LRG LVL3 (GOWN DISPOSABLE) ×4
GOWN STRL REUS W/TWL XL LVL3 (GOWN DISPOSABLE) ×6
HANDPIECE INTERPULSE COAX TIP (DISPOSABLE) ×2
HEAD ARTICULEZE (Hips) IMPLANT
KIT BASIN OR (CUSTOM PROCEDURE TRAY) ×2 IMPLANT
KIT TURNOVER KIT B (KITS) ×2 IMPLANT
LINER NEUTRAL 36ID 54OD (Liner) ×1 IMPLANT
MANIFOLD NEPTUNE II (INSTRUMENTS) ×2 IMPLANT
NS IRRIG 1000ML POUR BTL (IV SOLUTION) ×2 IMPLANT
PACK TOTAL JOINT (CUSTOM PROCEDURE TRAY) ×2 IMPLANT
PAD ARMBOARD 7.5X6 YLW CONV (MISCELLANEOUS) ×4 IMPLANT
SET HNDPC FAN SPRY TIP SCT (DISPOSABLE) ×1 IMPLANT
SPONGE LAP 18X18 RF (DISPOSABLE) ×1 IMPLANT
STAPLER VISISTAT 35W (STAPLE) ×1 IMPLANT
STEM FEM ACTIS HIGH SZ7 (Stem) ×1 IMPLANT
STRIP CLOSURE SKIN 1/2X4 (GAUZE/BANDAGES/DRESSINGS) ×2 IMPLANT
SUT ETHIBOND NAB CT1 #1 30IN (SUTURE) ×2 IMPLANT
SUT MNCRL AB 4-0 PS2 18 (SUTURE) IMPLANT
SUT VIC AB 0 CT1 27 (SUTURE) ×4
SUT VIC AB 0 CT1 27XBRD ANBCTR (SUTURE) ×1 IMPLANT
SUT VIC AB 1 CT1 27 (SUTURE) ×2
SUT VIC AB 1 CT1 27XBRD ANBCTR (SUTURE) ×1 IMPLANT
SUT VIC AB 2-0 CT1 27 (SUTURE) ×4
SUT VIC AB 2-0 CT1 TAPERPNT 27 (SUTURE) ×1 IMPLANT
TOWEL OR 17X24 6PK STRL BLUE (TOWEL DISPOSABLE) ×1 IMPLANT
TOWEL OR 17X26 10 PK STRL BLUE (TOWEL DISPOSABLE) ×2 IMPLANT
TRAY CATH 16FR W/PLASTIC CATH (SET/KITS/TRAYS/PACK) IMPLANT
TRAY FOLEY W/BAG SLVR 16FR (SET/KITS/TRAYS/PACK)
TRAY FOLEY W/BAG SLVR 16FR ST (SET/KITS/TRAYS/PACK) IMPLANT
WATER STERILE IRR 1000ML POUR (IV SOLUTION) ×4 IMPLANT

## 2018-09-27 NOTE — Progress Notes (Signed)
Patient ID: Dominic [name], adult   DOB: 04-21-1934, 85 y.o.   MRN: [id number] The patient understands fully that we are proceeding to surgery today for a right total hip arthroplasty to address his acute right femoral neck fracture.  The risks and benefits of surgery have been explained in detail and informed consent is obtained.  There has been no acute change in his medical status over the last 24 hours.

## 2018-09-27 NOTE — Transfer of Care (Signed)
Immediate Anesthesia Transfer of Care Note  Patient: Dominic [name]  Procedure(s) Performed: TOTAL HIP ARTHROPLASTY ANTERIOR APPROACH (Right Hip)  Patient Location: PACU  Anesthesia Type:General  Level of Consciousness: awake, alert  and oriented  Airway & Oxygen Therapy: Patient Spontanous Breathing and Patient connected to face mask oxygen  Post-op Assessment: Report given to RN and Post -op Vital signs reviewed and stable  Post vital signs: Reviewed and stable  Last Vitals:  Vitals Value Taken Time  BP 169/76 09/27/2018  9:35 AM  Temp    Pulse 65 09/27/2018  9:37 AM  Resp 20 09/27/2018  9:37 AM  SpO2 100 % 09/27/2018  9:37 AM  Vitals shown include unvalidated device data.  Last Pain:  Vitals:   09/27/18 0438  TempSrc: Oral  PainSc:          Complications: No apparent anesthesia complications

## 2018-09-27 NOTE — Anesthesia Preprocedure Evaluation (Addendum)
Anesthesia Evaluation  Patient identified by MRN, date of birth, ID band Patient awake    Reviewed: Allergy & Precautions, NPO status , Patient's Chart, lab work & pertinent test results  History of Anesthesia Complications Negative for: history of anesthetic complications  Airway Mallampati: II  TM Distance: >3 FB Neck ROM: Full    Dental  (+) Edentulous Upper, Edentulous Lower   Pulmonary sleep apnea ,    breath sounds clear to auscultation       Cardiovascular hypertension, + angina + CAD and + Past MI  + dysrhythmias Atrial Fibrillation  Rhythm:Irregular     Neuro/Psych negative neurological ROS  negative psych ROS   GI/Hepatic negative GI ROS, Neg liver ROS,   Endo/Other  negative endocrine ROS  Renal/GU negative Renal ROS     Musculoskeletal   Abdominal   Peds  Hematology Plavix last taken 4/10   Anesthesia Other Findings 3/19 tte: Left ventricle: The cavity size was normal. Wall thickness was   increased in a pattern of mild LVH. Systolic function was normal.   The estimated ejection fraction was in the range of 60% to 65%. - Aortic valve: There was trivial regurgitation. Valve area (VTI):   3.09 cm^2. Valve area (Vmax): 3.51 cm^2. Valve area (Vmean): 3.77   cm^2. - Mitral valve: Calcified annulus. Mildly thickened leaflets . - Left atrium: The atrium was mildly dilated.  Reproductive/Obstetrics                            Anesthesia Physical Anesthesia Plan  ASA: III  Anesthesia Plan: General   Post-op Pain Management:    Induction: Intravenous  PONV Risk Score and Plan: 2 and Ondansetron and Dexamethasone  Airway Management Planned: Oral ETT  Additional Equipment: None  Intra-op Plan:   Post-operative Plan: Extubation in OR  Informed Consent: I have reviewed the patients History and Physical, chart, labs and discussed the procedure including the risks, benefits and  alternatives for the proposed anesthesia with the patient or authorized representative who has indicated his/her understanding and acceptance.     Dental advisory given  Plan Discussed with: CRNA and Surgeon  Anesthesia Plan Comments:         Anesthesia Quick Evaluation

## 2018-09-27 NOTE — Brief Op Note (Signed)
09/26/2018 - 09/27/2018  9:17 AM  PATIENT:  Dominic [name]  85 y.o. adult  PRE-OPERATIVE DIAGNOSIS:  right hip femoral neck fracture  POST-OPERATIVE DIAGNOSIS:  Right hip femoral neck fracture  PROCEDURE:  Procedure(s): TOTAL HIP ARTHROPLASTY ANTERIOR APPROACH (Right)  SURGEON:  Surgeon(s) and Role:    Mcarthur Rossetti, MD - Primary  PHYSICIAN ASSISTANT: Benita Stabile, PA-C  ANESTHESIA:   general  EBL:  500 mL   COUNTS:  YES  PLAN OF CARE: Admit to inpatient   PATIENT DISPOSITION:  PACU - hemodynamically stable.   Delay start of Pharmacological VTE agent (>24hrs) due to surgical blood loss or risk of bleeding: no

## 2018-09-27 NOTE — Anesthesia Postprocedure Evaluation (Signed)
Anesthesia Post Note  Patient: Dominic [name]  Procedure(s) Performed: TOTAL HIP ARTHROPLASTY ANTERIOR APPROACH (Right Hip)     Patient location during evaluation: PACU Anesthesia Type: General Level of consciousness: awake and alert Pain management: pain level controlled Vital Signs Assessment: post-procedure vital signs reviewed and stable Respiratory status: spontaneous breathing, nonlabored ventilation, respiratory function stable and patient connected to nasal cannula oxygen Cardiovascular status: blood pressure returned to baseline and stable Postop Assessment: no apparent nausea or vomiting Anesthetic complications: no    Last Vitals:  Vitals:   09/27/18 1107 09/27/18 1413  BP:  102/64  Pulse:  77  Resp:  20  Temp:  36.9 C  SpO2: 100% 93%    Last Pain:  Vitals:   09/27/18 1413  TempSrc: Oral  PainSc:                  [name]

## 2018-09-27 NOTE — Progress Notes (Signed)
Orthopedic Tech Progress Note Patient Details:  Dominic [name] 1934/04/04 [id number] Applied Over Head Frame and Trapeze for patient Patient ID: Dominic [name], adult   DOB: 1933-10-28, 85 y.o.   MRN: [id number]   Dominic [name] 09/27/2018, 1:13 PM

## 2018-09-27 NOTE — Progress Notes (Signed)
Pt has refused cpap at this time. RT will monitor. 

## 2018-09-27 NOTE — Op Note (Signed)
NAME: Dominic [name], Dominic [name]. MEDICAL RECORD [id number] ACCOUNT [id number] DATE OF BIRTH:[birth year] FACILITY: [facility] LOCATION: [facility] PHYSICIAN:Hovanes Hymas Kerry Fort, MD  OPERATIVE REPORT  DATE OF PROCEDURE:  09/27/2018  PREOPERATIVE DIAGNOSIS:  Right hip displaced femoral neck fracture.  POSTOPERATIVE DIAGNOSIS:  Right hip displaced femoral neck fracture.  PROCEDURE:  Right total hip arthroplasty through direct anterior approach.  IMPLANTS:  DePuy Sector Gription acetabular component size 54, size 36+0 neutral polyethylene liner, size 7 Actis femoral component with high offset, size 36+5 metal hip ball.  SURGEON:  Lind Guest. Ninfa Linden, MD  ASSISTANT:  Erskine Emery, PA-C.  ANESTHESIA:  General.  ANTIBIOTICS:  Two grams IV Ancef.  ESTIMATED BLOOD LOSS:  500-600 mL.  COMPLICATIONS:  None.  INDICATIONS:  The patient is an active 85 year old gentleman, who has had a history of mini strokes, so he was on Eliquis and Plavix.  He sustained an accidental mechanical fall yesterday morning and landed hard on his right hip.  He had significant  right hip pain and the inability to ambulate.  He was brought to the [facility] emergency room and x-rays did confirm a displaced femoral neck fracture.  He was admitted graciously to the medical service and we waited until today for surgery based on the  fact that he has been on Eliquis and Plavix, although knowing that he will definitely have acute blood loss anemia from the surgery because I will not delay the surgery many days due to the complications of femoral neck fractures like this going  unfixed.  He is someone who does ambulate with a shuffling gait reveals over the residual effects of many strokes.    I had a long and thorough discussion with him and his wife about the recommendations for surgery as well as nonoperative and operative treatment options.  We talked in great detail about the risk of acute blood loss anemia, nerve or vessel  injury,  fracture, infection, DVT, and implant failure and dislocation.  We talked about our goals of decreased pain, improving his mobility and improving his quality of life.  DESCRIPTION OF PROCEDURE:  After informed consent was obtained and appropriate right hip was marked.  He was brought to the operating room where general anesthesia was obtained while he was on a stretcher.  We then placed traction boots on both his feet  and placed him supine on the Hana fracture table, the perineal post in place and both legs in line skeletal traction device and no traction applied.  His right operative hip was prepped and draped with ChloraPrep and sterile drapes.  A time-out was  called.  He was identified as the correct patient, correct right hip.  I then made an incision just inferior and posterior to the anterior superior iliac spine and carried this obliquely down the leg.  We dissected down tensor to the  fascia lata muscle.   The tensor fascia was then divided longitudinally to proceed with direct anterior approach to the hip.  We identified and cauterized circumflex vessels.  I then identified the hip capsule.  I opened up the hip capsule finding a hemarthrosis consistent  with a femoral neck fracture and you can see the femoral head and neck were disassociated and had an obvious fracture and displacement.  We placed Cobra retractors around the medial and lateral remnants of the femoral neck and then made a freshening cut  with an oscillating saw just proximal to the lesser trochanter and completed this with an osteotome.  We placed a  corkscrew guide in the femoral head and removed the femoral heads entirety.  I then removed remnants of the acetabular labrum and other  debris from the acetabulum.  We then began reaming under direct visualization from a size 43 reamer, going up to a size 53 with all reamers under direct visualization.  The last reamer was under direct fluoroscopy, so we could obtain our  depth of  reaming, our inclination and anteversion.  I then placed the real DePuy Sector Gription acetabular component size 54 and a 36+0 neutral polyethylene liner.  Attention was then turned to the femur.  With the leg externally rotated to 120 degrees, I  extended and adducted.  We placed a Mueller retractor medially and Hohman retractor behind the greater trochanter, released lateral joint capsule and used a box-cutting osteotome to enter the femoral canal and a rongeur to lateralize then began broaching  from the starter broach in stepwise 1 mm increments going from up to a size 7.  With a size 7 in place, we tried a high offset femoral neck and a 36-2 hip ball due to higher neck cut.  Reduced this in the acetabulum and it was stable which we could tell  he needed a little bit more offset and leg length.  We dislocated the hip and removed the trial components.  We placed the real ACTIS femoral component with high offset size 7 and I decided to go with a 36+5 metal hip ball to just lengthen him a little  more and  to tighten his tissues up given the fact that he is not a fall risk and walks with a shuffling gait.  I placed this without difficulty and reduced this in the acetabulum and it was stable.  We assessed this radiographically and under clinical  exam.  We then irrigated the soft tissue with normal saline solution using pulsatile lavage.  We were able to close the thick joint capsule with interrupted #1 Ethibond suture, followed by a running #1 Vicryl to close the tensor fascia, 0 Vicryl, closed  the deep tissue; 2-0 Vicryl was used to close subcutaneous tissue and interrupted staples were used to close the skin.  Xeroform and Aquacel dressing was applied.    He was then awakened, extubated, and taken to recovery room in stable condition.    All final counts were correct.    There were no complications noted.    Postoperatively, we will allow him to weightbear as tolerated and get up with  therapy with no hip precautions.    Of note, Benita Stabile, PA-C, assisted the entire case.  Assistance was crucial for facilitating all aspects of this case.  AN/NUANCE  D:09/27/2018 T:09/27/2018 JOB:[id number]

## 2018-09-27 NOTE — Progress Notes (Signed)
SW consult received. Awaiting PT/OT recommendations for level of care.

## 2018-09-27 NOTE — Progress Notes (Signed)
PROGRESS NOTE    Dominic [name]  BZJ:[id number] DOB: [birth year] DOA: 09/26/2018 PCP: Lawerance Cruel, MD   Brief Narrative:  Dominic [name] is Dominic [name] 85 y.o. adult with medical history significant of atrial fibrillation, CAD, hyperlipidemia, OSA who presents after Dominic [name] fall at home.  He states that he was in the bathroom when he lost his balance and fell.  He denies any loss of consciousness or prodromal symptoms, did not hit his head.  Currently, he is feeling well, pain is well controlled.  He denies any recent illnesses such as fevers, chills, cough, chest pain, shortness of breath, abdominal pain, nausea, vomiting, diarrhea.  ED Course: CT head negative for acute abnormality.  Imaging revealed impacted right femoral neck fracture.  Orthopedic surgery consulted.  Assessment & Plan:   Principal Problem:   Right femoral fracture (HCC) Active Problems:   CAD S/P percutaneous coronary angioplasty   Hypertension   PAF (paroxysmal atrial fibrillation) (HCC)   OSA (obstructive sleep apnea)   Chronic anticoagulation   Right femoral neck fracture after mechanical fall at home -S/p R total hip arthroplasty 4/12 with ortho - Eliquis to restart tomorrow - PT/OT, pain control  Paroxysmal atrial fibrillation -Resume eliquis tomorrow per ortho  -Continue Toprol  Essential hypertension -Continue Norvasc  CAD  -Followed by Dr. Martinique as an outpatient.  Has had prior stents in 2006 and 2016. -resume plavix tomorrow  OSA -CPAP nightly  DVT prophylaxis: eliquis to restart tomorrow Code Status: full  Family Communication: none at bedside Disposition Plan: pending   Consultants:   orthopedics  Procedures:   Total hip arthroplasty anterior approach 4/12  Antimicrobials: Anti-infectives (From admission, onward)   Start     Dose/Rate Route Frequency Ordered Stop   09/27/18 1400  ceFAZolin (ANCEF) IVPB 2g/100 mL premix     2 g 200 mL/hr over 30 Minutes Intravenous Every 6 hours  09/27/18 1056 09/28/18 0159      Subjective: Doing well post op. Some pain. Enjoying omelet.   Objective: Vitals:   09/27/18 1012 09/27/18 1020 09/27/18 1107 09/27/18 1413  BP: 134/66 (!) 139/54  102/64  Pulse: 63 61  77  Resp: 15 15  20   Temp:  98.5 F (36.9 C)  98.4 F (36.9 C)  TempSrc:    Oral  SpO2: 100% 100% 100% 93%  Weight:      Height:        Intake/Output Summary (Last 24 hours) at 09/27/2018 1418 Last data filed at 09/27/2018 0929 Gross per 24 hour  Intake 1310 ml  Output 1450 ml  Net -140 ml   Filed Weights   09/26/18 0738  Weight: 88 kg    Examination:  General exam: Appears calm and comfortable  Respiratory system: Clear to auscultation. Respiratory effort normal. Cardiovascular system: S1 & S2 heard, RRR Gastrointestinal system: Abdomen is nondistended, soft and nontender.  Central nervous system: Alert and oriented. No focal neurological deficits. Extremities: RLE with intact dressing Skin: No rashes, lesions or ulcers Psychiatry: Judgement and insight appear normal. Mood & affect appropriate.     Data Reviewed: I have personally reviewed following labs and imaging studies  CBC: Recent Labs  Lab 09/26/18 0754 09/27/18 0301  WBC 5.8 9.1  NEUTROABS 4.0  --   HGB 14.8 14.1  HCT 46.6 42.3  MCV 86.8 84.9  PLT 161 017   Basic Metabolic Panel: Recent Labs  Lab 09/26/18 0754 09/27/18 0301  NA 140 141  K 3.8 3.8  CL 105  109  CO2 24 22  GLUCOSE 101* 113*  BUN 16 19  CREATININE 0.91 0.93  CALCIUM 9.4 9.1   GFR: Estimated Creatinine Clearance (by C-G formula based on SCr of 0.93 mg/dL) Male: 57.2 mL/min Male: 65.2 mL/min Liver Function Tests: No results for input(s): AST, ALT, ALKPHOS, BILITOT, PROT, ALBUMIN in the last 168 hours. No results for input(s): LIPASE, AMYLASE in the last 168 hours. No results for input(s): AMMONIA in the last 168 hours. Coagulation Profile: No results for input(s): INR, PROTIME in the last 168  hours. Cardiac Enzymes: No results for input(s): CKTOTAL, CKMB, CKMBINDEX, TROPONINI in the last 168 hours. BNP (last 3 results) No results for input(s): PROBNP in the last 8760 hours. HbA1C: No results for input(s): HGBA1C in the last 72 hours. CBG: No results for input(s): GLUCAP in the last 168 hours. Lipid Profile: No results for input(s): CHOL, HDL, LDLCALC, TRIG, CHOLHDL, LDLDIRECT in the last 72 hours. Thyroid Function Tests: No results for input(s): TSH, T4TOTAL, FREET4, T3FREE, THYROIDAB in the last 72 hours. Anemia Panel: No results for input(s): VITAMINB12, FOLATE, FERRITIN, TIBC, IRON, RETICCTPCT in the last 72 hours. Sepsis Labs: No results for input(s): PROCALCITON, LATICACIDVEN in the last 168 hours.  Recent Results (from the past 240 hour(s))  Surgical pcr screen     Status: None   Collection Time: 09/26/18  3:40 PM  Result Value Ref Range Status   MRSA, PCR NEGATIVE NEGATIVE Final   Staphylococcus aureus NEGATIVE NEGATIVE Final    Comment: (NOTE) The Xpert SA Assay (FDA approved for NASAL specimens in patients 75 years of age and older), is one component of Dominic [name] comprehensive surveillance program. It is not intended to diagnose infection nor to guide or monitor treatment. Performed at [facility], [place] [address] [place], [place] [address]          Radiology Studies: Dg Chest 1 View  Result Date: 09/26/2018 CLINICAL DATA:  85 year old who fell early this morning while going to the bathroom, landing on his RIGHT side. Initial encounter. EXAM: CHEST  1 VIEW COMPARISON:  09/03/2017 and earlier. FINDINGS: AP supine examination was performed. Cardiac silhouette upper normal in size to slightly enlarged for AP technique, unchanged. Thoracic aorta mildly atherosclerotic, unchanged. Hilar and mediastinal contours otherwise unremarkable. Stable chronic elevation of the RIGHT hemidiaphragm and chronic scar/atelectasis involving the RIGHT lung base. Lungs otherwise  clear. Normal pulmonary vascularity. No visible pleural effusions. IMPRESSION: 1. No acute cardiopulmonary disease. 2. Stable chronic elevation of the RIGHT hemidiaphragm and chronic scar/atelectasis involving the RIGHT lung base. Electronically Signed   By: Evangeline Dakin M.D.   On: 09/26/2018 09:21   Ct Head Wo Contrast  Result Date: 09/26/2018 CLINICAL DATA:  Head trauma.  Fall. EXAM: CT HEAD WITHOUT CONTRAST TECHNIQUE: Contiguous axial images were obtained from the base of the skull through the vertex without intravenous contrast. COMPARISON:  07/13/2018 head CT. FINDINGS: Brain: No evidence of parenchymal hemorrhage or extra-axial fluid collection. No mass lesion, mass effect, or midline shift. No CT evidence of acute infarction. Generalized cerebral volume loss. Nonspecific mild subcortical and periventricular white matter hypodensity, most in keeping with chronic small vessel ischemic change. Cerebral ventricle sizes are stable and concordant with the degree of cerebral volume loss. Vascular: No acute abnormality. Skull: No evidence of calvarial fracture. Sinuses/Orbits: No fluid levels. Mucoperiosteal thickening and mucous retention cysts versus polyps in inferior maxillary sinuses bilaterally. Other:  The mastoid air cells are unopacified. IMPRESSION: 1. No evidence of acute  intracranial abnormality. No evidence of calvarial fracture. 2. Generalized cerebral volume loss and chronic mild small vessel ischemic changes in the cerebral white matter. 3. Mild chronic paranasal sinusitis. Electronically Signed   By: Ilona Sorrel M.D.   On: 09/26/2018 08:57   Pelvis Portable  Result Date: 09/27/2018 CLINICAL DATA:  Right hip replacement EXAM: PORTABLE PELVIS 1-2 VIEWS COMPARISON:  09/26/2018 FINDINGS: Right hip replacement satisfactory position and alignment. No fracture or complication. IMPRESSION: Satisfactory right hip replacement for fracture. Electronically Signed   By: Franchot Gallo M.D.   On:  09/27/2018 11:35   Dg Knee Complete 4 Views Right  Result Date: 09/26/2018 CLINICAL DATA:  85 year old who fell earlier this morning while going to the bathroom, injuring the RIGHT knee. Prior RIGHT ANTERIOR cruciate ligament repair. Initial encounter. EXAM: RIGHT KNEE - COMPLETE 4+ VIEW COMPARISON:  11/16/2005. FINDINGS: Postsurgical changes related to the ACL repair. Mild prepatellar soft tissue swelling. No evidence of acute fracture or dislocation. Mild to moderate tricompartment joint space narrowing. Bone mineral density well preserved for patient age. Femoropopliteal and tibioperoneal artery atherosclerosis. IMPRESSION: 1. No acute osseous abnormality. 2. Mild to moderate tricompartment osteoarthritis. Electronically Signed   By: Evangeline Dakin M.D.   On: 09/26/2018 09:24   Dg C-arm 1-60 Min  Result Date: 09/27/2018 CLINICAL DATA:  Right hip fracture. EXAM: DG C-ARM 61-120 MIN; OPERATIVE RIGHT HIP WITH PELVIS COMPARISON:  For 04/2019 FINDINGS: C-arm images of the right hip demonstrate right hip replacement. Normal alignment. No complication. IMPRESSION: Satisfactory right hip replacement for fracture. Electronically Signed   By: Franchot Gallo M.D.   On: 09/27/2018 11:36   Dg Hip Operative Unilat With Pelvis Right  Result Date: 09/27/2018 CLINICAL DATA:  Right hip fracture. EXAM: DG C-ARM 61-120 MIN; OPERATIVE RIGHT HIP WITH PELVIS COMPARISON:  For 04/2019 FINDINGS: C-arm images of the right hip demonstrate right hip replacement. Normal alignment. No complication. IMPRESSION: Satisfactory right hip replacement for fracture. Electronically Signed   By: Franchot Gallo M.D.   On: 09/27/2018 11:36   Dg Hip Unilat With Pelvis 2-3 Views Right  Result Date: 09/26/2018 CLINICAL DATA:  Fall with right hip pain EXAM: DG HIP (WITH OR WITHOUT PELVIS) 2-3V RIGHT COMPARISON:  04/19/2014 CT abdomen/pelvis FINDINGS: Mildly comminuted and mildly impacted right mid femoral neck fracture with mild 4 mm lateral  displacement of the dominant distal fracture fragment. No dislocation at the right hip joint. No pelvic diastasis. No suspicious focal osseous lesions. Surgical clips throughout the deep pelvis. IMPRESSION: Mildly displaced and mildly impacted right femoral neck fracture. Electronically Signed   By: Ilona Sorrel M.D.   On: 09/26/2018 09:24        Scheduled Meds:  amLODipine  5 mg Oral Daily   [START ON 09/28/2018] apixaban  5 mg Oral BID   [START ON 09/28/2018] clopidogrel  75 mg Oral Daily   docusate sodium  100 mg Oral BID   fentaNYL       metoprolol succinate  75 mg Oral Daily   pantoprazole  40 mg Oral Daily   sodium chloride flush  3 mL Intravenous Q12H   Continuous Infusions:  sodium chloride      ceFAZolin (ANCEF) IV 2 g (09/27/18 1346)   methocarbamol (ROBAXIN) IV       LOS: 1 day    Time spent: over 30 min    Fayrene Helper, MD [facility] Pager AMION  If 7PM-7AM, please contact night-coverage [email or web address] Password [facility] 09/27/2018, 2:18 PM

## 2018-09-27 NOTE — Anesthesia Procedure Notes (Signed)
Procedure Name: Intubation Date/Time: 09/27/2018 7:56 AM Performed by: Trinna Post., CRNA Pre-anesthesia Checklist: Patient identified, Emergency Drugs available, Suction available, Patient being monitored and Timeout performed Patient Re-evaluated:Patient Re-evaluated prior to induction Oxygen Delivery Method: Circle system utilized Preoxygenation: Pre-oxygenation with 100% oxygen Induction Type: IV induction, Rapid sequence and Cricoid Pressure applied Laryngoscope Size: Mac and 4 Grade View: Grade I Tube type: Oral Tube size: 7.5 mm Number of attempts: 1 Airway Equipment and Method: Stylet Placement Confirmation: ETT inserted through vocal cords under direct vision,  positive ETCO2 and breath sounds checked- equal and bilateral Secured at: 23 cm Tube secured with: Tape Dental Injury: Teeth and Oropharynx as per pre-operative assessment

## 2018-09-28 ENCOUNTER — Encounter (HOSPITAL_COMMUNITY): Payer: Self-pay | Admitting: Orthopaedic Surgery

## 2018-09-28 LAB — CBC
HCT: 35.3 % — ABNORMAL LOW (ref 39.0–52.0)
Hemoglobin: 11.5 g/dL — ABNORMAL LOW (ref 13.0–17.0)
MCH: 28.3 pg (ref 26.0–34.0)
MCHC: 32.6 g/dL (ref 30.0–36.0)
MCV: 86.7 fL (ref 80.0–100.0)
Platelets: 180 10*3/uL (ref 150–400)
RBC: 4.07 MIL/uL — ABNORMAL LOW (ref 4.22–5.81)
RDW: 15 % (ref 11.5–15.5)
WBC: 13.9 10*3/uL — ABNORMAL HIGH (ref 4.0–10.5)
nRBC: 0 % (ref 0.0–0.2)

## 2018-09-28 LAB — BASIC METABOLIC PANEL
Anion gap: 11 (ref 5–15)
BUN: 28 mg/dL — ABNORMAL HIGH (ref 8–23)
CO2: 24 mmol/L (ref 22–32)
Calcium: 9.1 mg/dL (ref 8.9–10.3)
Chloride: 105 mmol/L (ref 98–111)
Creatinine, Ser: 1.37 mg/dL — ABNORMAL HIGH (ref 0.61–1.24)
GFR calc Af Amer: 55 mL/min — ABNORMAL LOW (ref >60)
GFR calc non Af Amer: 48 mL/min — ABNORMAL LOW (ref >60)
Glucose, Bld: 136 mg/dL — ABNORMAL HIGH (ref 70–99)
Potassium: 4 mmol/L (ref 3.5–5.1)
Sodium: 140 mmol/L (ref 135–145)

## 2018-09-28 LAB — URINALYSIS, ROUTINE W REFLEX MICROSCOPIC
Bilirubin Urine: NEGATIVE
Glucose, UA: NEGATIVE mg/dL
Hgb urine dipstick: NEGATIVE
Ketones, ur: NEGATIVE mg/dL
Leukocytes,Ua: NEGATIVE
Nitrite: NEGATIVE
Protein, ur: NEGATIVE mg/dL
Specific Gravity, Urine: 1.024 (ref 1.005–1.030)
pH: 5 (ref 5.0–8.0)

## 2018-09-28 LAB — MAGNESIUM: Magnesium: 2.3 mg/dL (ref 1.7–2.4)

## 2018-09-28 MED ORDER — LACTATED RINGERS IV SOLN
INTRAVENOUS | Status: AC
  Administered 2018-09-28: 10:00:00 via INTRAVENOUS

## 2018-09-28 NOTE — Evaluation (Addendum)
Physical Therapy Evaluation Patient Details Name: Dominic [name] MRN: [id number] DOB: 08/11/1933 Today's Date: 09/28/2018   History of Present Illness  85 yo male s/p direct anterior hip due to femoral fx with greater than 6 falls in 6 months per patient. PMH: atrial fibrillation, CAD, hyperlipidemia, OSA, macular degeneration, prostate CA.  Clinical Impression  Patient presents with decreased mobility due to pain, limited safety awareness, decreased balance, decreased activity tolerance and will benefit from skilled PT in the acute setting to allow return home with family support following SNF level rehab stay.     Follow Up Recommendations SNF;Supervision/Assistance - 24 hour    Equipment Recommendations  None recommended by PT    Recommendations for Other Services       Precautions / Restrictions Precautions Precautions: Fall Restrictions Weight Bearing Restrictions: Yes RLE Weight Bearing: Weight bearing as tolerated      Mobility  Bed Mobility Overal bed mobility: Needs Assistance Bed Mobility: Sit to Supine     Supine to sit: Max assist Sit to supine: Mod assist;HOB elevated;+2 for physical assistance   General bed mobility comments: assist for legs into bed and for trunk to supine  Transfers Overall transfer level: Needs assistance   Transfers: Sit to/from Stand;Stand Pivot Transfers Sit to Stand: Mod assist;+2 physical assistance Stand pivot transfers: Total assist       General transfer comment: use of Stedy to stand, able to sit on seat for pivot to bed using [facility]  Ambulation/Gait                Stairs            Wheelchair Mobility    Modified Rankin (Stroke Patients Only)       Balance Overall balance assessment: Needs assistance;History of Falls   Sitting balance-Leahy Scale: Poor Sitting balance - Comments: L leateral lean, min a for balance   Standing balance support: Bilateral upper extremity supported Standing  balance-Leahy Scale: Poor Standing balance comment: stood up on Stedy with bilat UE support and assist for safety                             Pertinent Vitals/Pain Pain Assessment: Faces Faces Pain Scale: Hurts little more Pain Location: L hip with certain movements Pain Descriptors / Indicators: Grimacing;Guarding Pain Intervention(s): Monitored during session;Repositioned    Home Living Family/patient expects to be discharged to:: Private residence Living Arrangements: Spouse/significant other Available Help at Discharge: Family Type of Home: House Home Access: Level entry     Home Layout: One level Home Equipment: Cane - single point;Walker - 2 wheels      Prior Function Level of Independence: Independent         Comments: reports at least 6 falls in the last 6 months     Hand Dominance   Dominant Hand: Right    Extremity/Trunk Assessment   Upper Extremity Assessment Upper Extremity Assessment: Defer to OT evaluation    Lower Extremity Assessment Lower Extremity Assessment: RLE deficits/detail RLE Deficits / Details: AROM limited by pain, strength knee extension 3-/5, ankle DF 4/5, hip flexion <3/5    Cervical / Trunk Assessment Cervical / Trunk Assessment: Kyphotic;Other exceptions Cervical / Trunk Exceptions: L lateral lean (pt reports is chronic)  Communication   Communication: No difficulties  Cognition Arousal/Alertness: Awake/alert Behavior During Therapy: WFL for tasks assessed/performed Overall Cognitive Status: No family/caregiver present to determine baseline cognitive functioning  General Comments: decreased safety awareness, pt initiating attempt to get back to bed upon my entry      General Comments General comments (skin integrity, edema, etc.): re-oriented with NT to call button and to use for any mobility as returned to supine    Exercises Total Joint Exercises Ankle  Circles/Pumps: AROM;5 reps;Both;Seated Long Arc Quad: AROM;Both;5 reps;Seated General Exercises - Lower Extremity Ankle Circles/Pumps: AROM;Both;10 reps;Seated Hip ABduction/ADduction: AROM;Both;10 reps;Seated Toe Raises: AROM;Both;10 reps;Seated   Assessment/Plan    PT Assessment Patient needs continued PT services  PT Problem List Decreased strength;Decreased mobility;Decreased safety awareness;Decreased balance;Decreased knowledge of use of DME;Pain;Decreased activity tolerance;Decreased range of motion       PT Treatment Interventions DME instruction;Therapeutic activities;Patient/family education;Therapeutic exercise;Gait training;Balance training;Functional mobility training    PT Goals (Current goals can be found in the Care Plan section)  Acute Rehab PT Goals Patient Stated Goal: to return to wife PT Goal Formulation: With patient Time For Goal Achievement: 10/12/18 Potential to Achieve Goals: Fair    Frequency Min 3X/week   Barriers to discharge        Co-evaluation               AM-PAC PT "6 Clicks" Mobility  Outcome Measure Help needed turning from your back to your side while in a flat bed without using bedrails?: A Lot Help needed moving from lying on your back to sitting on the side of a flat bed without using bedrails?: Total Help needed moving to and from a bed to a chair (including a wheelchair)?: Total Help needed standing up from a chair using your arms (e.g., wheelchair or bedside chair)?: Total Help needed to walk in hospital room?: Total Help needed climbing 3-5 steps with a railing? : Total 6 Click Score: 7    End of Session Equipment Utilized During Treatment: Gait belt Activity Tolerance: Patient limited by fatigue Patient left: in bed;with call bell/phone within reach;with bed alarm set Nurse Communication: Mobility status;Need for lift equipment PT Visit Diagnosis: Other abnormalities of gait and mobility (R26.89);History of falling  (Z91.81);Muscle weakness (generalized) (M62.81)    Time: 1540-0867 PT Time Calculation (min) (ACUTE ONLY): 19 min   Charges:   PT Evaluation $PT Eval Moderate Complexity: [place], [place] Acute Rehabilitation Services [phone] 09/28/2018   Reginia Naas 09/28/2018, 5:27 PM

## 2018-09-28 NOTE — Progress Notes (Addendum)
PROGRESS NOTE    DEMARYIUS IMRAN  JJO:[id number] DOB: [birth year] DOA: 09/26/2018 PCP: Lawerance Cruel, MD   Brief Narrative:  Dominic [name] is Dominic [name] 85 y.o. adult with medical history significant of atrial fibrillation, CAD, hyperlipidemia, OSA who presents after Fannie Alomar fall at home.  He states that he was in the bathroom when he lost his balance and fell.  He denies any loss of consciousness or prodromal symptoms, did not hit his head.  Currently, he is feeling well, pain is well controlled.  He denies any recent illnesses such as fevers, chills, cough, chest pain, shortness of breath, abdominal pain, nausea, vomiting, diarrhea.  ED Course: CT head negative for acute abnormality.  Imaging revealed impacted right femoral neck fracture.  Orthopedic surgery consulted.  Assessment & Plan:   Principal Problem:   Right femoral fracture (HCC) Active Problems:   CAD S/P percutaneous coronary angioplasty   Hypertension   PAF (paroxysmal atrial fibrillation) (HCC)   OSA (obstructive sleep apnea)   Chronic anticoagulation   Hip fracture, right (HCC)   Right femoral neck fracture after mechanical fall at home -S/p R total hip arthroplasty 4/12 with ortho - Eliquis to restart today - PT/OT, pain control  Acute Kidney Injury - Likely hypovolemia, will give fluid challenge and follow in AM - Follow UA  Paroxysmal atrial fibrillation -Eliquis  -Continue Toprol  Essential hypertension -Continue Norvasc  CAD  -Followed by Dr. Martinique as an outpatient.  Has had prior stents in 2006 and 2016. -Plavix  OSA -CPAP nightly  DVT prophylaxis: eliquis Code Status: full  Family Communication: called wife Disposition Plan: pending   Consultants:   orthopedics  Procedures:   Total hip arthroplasty anterior approach 4/12  Antimicrobials: Anti-infectives (From admission, onward)   Start     Dose/Rate Route Frequency Ordered Stop   09/27/18 1400  ceFAZolin (ANCEF) IVPB 2g/100 mL premix      2 g 200 mL/hr over 30 Minutes Intravenous Every 6 hours 09/27/18 1056 09/27/18 2203      Subjective: Feeling ok today  Objective: Vitals:   09/28/18 0112 09/28/18 0529 09/28/18 0933 09/28/18 1424  BP: 118/67 114/82 130/62 (!) 107/55  Pulse: 87 84 75 88  Resp: 18 18  16   Temp: 97.9 F (36.6 C) 97.7 F (36.5 C)  98.4 F (36.9 C)  TempSrc: Oral Oral  Oral  SpO2: 97% 98%  96%  Weight:      Height:        Intake/Output Summary (Last 24 hours) at 09/28/2018 1459 Last data filed at 09/28/2018 0341 Gross per 24 hour  Intake 0 ml  Output -  Net 0 ml   Filed Weights   09/26/18 0738  Weight: 88 kg    Examination:  General: No acute distress. Cardiovascular: Heart sounds show Vella Colquitt regular rate, and rhythm.  Lungs: Clear to auscultation bilaterally . Abdomen: Soft, nontender, nondistended  Neurological: Alert and oriented 3. Moves all extremities 4. Cranial nerves II through XII grossly intact. Skin: Warm and dry. No rashes or lesions. Extremities: No clubbing or cyanosis. No edema.  Psychiatric: Mood and affect are normal. Insight and judgment are appropriate.    Data Reviewed: I have personally reviewed following labs and imaging studies  CBC: Recent Labs  Lab 09/26/18 0754 09/27/18 0301 09/28/18 0248  WBC 5.8 9.1 13.9*  NEUTROABS 4.0  --   --   HGB 14.8 14.1 11.5*  HCT 46.6 42.3 35.3*  MCV 86.8 84.9 86.7  PLT 161 161  818   Basic Metabolic Panel: Recent Labs  Lab 09/26/18 0754 09/27/18 0301 09/28/18 0248  NA 140 141 140  K 3.8 3.8 4.0  CL 105 109 105  CO2 24 22 24   GLUCOSE 101* 113* 136*  BUN 16 19 28*  CREATININE 0.91 0.93 1.37*  CALCIUM 9.4 9.1 9.1  MG  --   --  2.3   GFR: Estimated Creatinine Clearance (by C-G formula based on SCr of 1.37 mg/dL (H)) Male: 38.8 mL/min (Justa Hatchell) Male: 44.3 mL/min (Shlome Baldree) Liver Function Tests: No results for input(s): AST, ALT, ALKPHOS, BILITOT, PROT, ALBUMIN in the last 168 hours. No results for input(s): LIPASE,  AMYLASE in the last 168 hours. No results for input(s): AMMONIA in the last 168 hours. Coagulation Profile: No results for input(s): INR, PROTIME in the last 168 hours. Cardiac Enzymes: No results for input(s): CKTOTAL, CKMB, CKMBINDEX, TROPONINI in the last 168 hours. BNP (last 3 results) No results for input(s): PROBNP in the last 8760 hours. HbA1C: No results for input(s): HGBA1C in the last 72 hours. CBG: No results for input(s): GLUCAP in the last 168 hours. Lipid Profile: No results for input(s): CHOL, HDL, LDLCALC, TRIG, CHOLHDL, LDLDIRECT in the last 72 hours. Thyroid Function Tests: No results for input(s): TSH, T4TOTAL, FREET4, T3FREE, THYROIDAB in the last 72 hours. Anemia Panel: No results for input(s): VITAMINB12, FOLATE, FERRITIN, TIBC, IRON, RETICCTPCT in the last 72 hours. Sepsis Labs: No results for input(s): PROCALCITON, LATICACIDVEN in the last 168 hours.  Recent Results (from the past 240 hour(s))  Surgical pcr screen     Status: None   Collection Time: 09/26/18  3:40 PM  Result Value Ref Range Status   MRSA, PCR NEGATIVE NEGATIVE Final   Staphylococcus aureus NEGATIVE NEGATIVE Final    Comment: (NOTE) The Xpert SA Assay (FDA approved for NASAL specimens in patients 23 years of age and older), is one component of [facility] comprehensive surveillance program. It is not intended to diagnose infection nor to guide or monitor treatment. Performed at [facility] [address] [place], [place] [address]          Radiology Studies: Pelvis Portable  Result Date: 09/27/2018 CLINICAL DATA:  Right hip replacement EXAM: PORTABLE PELVIS 1-2 VIEWS COMPARISON:  09/26/2018 FINDINGS: Right hip replacement satisfactory position and alignment. No fracture or complication. IMPRESSION: Satisfactory right hip replacement for fracture. Electronically Signed   By: Franchot Gallo M.D.   On: 09/27/2018 11:35   Dg C-arm 1-60 Min  Result Date: 09/27/2018 CLINICAL DATA:   Right hip fracture. EXAM: DG C-ARM 61-120 MIN; OPERATIVE RIGHT HIP WITH PELVIS COMPARISON:  For 04/2019 FINDINGS: C-arm images of the right hip demonstrate right hip replacement. Normal alignment. No complication. IMPRESSION: Satisfactory right hip replacement for fracture. Electronically Signed   By: Franchot Gallo M.D.   On: 09/27/2018 11:36   Dg Hip Operative Unilat With Pelvis Right  Result Date: 09/27/2018 CLINICAL DATA:  Right hip fracture. EXAM: DG C-ARM 61-120 MIN; OPERATIVE RIGHT HIP WITH PELVIS COMPARISON:  For 04/2019 FINDINGS: C-arm images of the right hip demonstrate right hip replacement. Normal alignment. No complication. IMPRESSION: Satisfactory right hip replacement for fracture. Electronically Signed   By: Franchot Gallo M.D.   On: 09/27/2018 11:36        Scheduled Meds: . amLODipine  5 mg Oral Daily  . apixaban  5 mg Oral BID  . clopidogrel  75 mg Oral Daily  . docusate sodium  100 mg Oral BID  .  metoprolol succinate  75 mg Oral Daily  . pantoprazole  40 mg Oral Daily  . sodium chloride flush  3 mL Intravenous Q12H   Continuous Infusions: . sodium chloride    . lactated ringers 75 mL/hr at 09/28/18 0931  . methocarbamol (ROBAXIN) IV       LOS: 2 days    Time spent: over 30 min    Fayrene Helper, MD [facility] Pager AMION  If 7PM-7AM, please contact night-coverage [email or web address] Password [facility] 09/28/2018, 2:59 PM

## 2018-09-28 NOTE — Progress Notes (Signed)
Subjective: 1 Day Post-Op Procedure(s) (LRB): TOTAL HIP ARTHROPLASTY ANTERIOR APPROACH (Right) Patient reports pain as moderate.    Objective: Vital signs in last 24 hours: Temp:  [97.7 F (36.5 C)-98.4 F (36.9 C)] 97.7 F (36.5 C) (04/13 0529) Pulse Rate:  [75-87] 75 (04/13 0933) Resp:  [18-20] 18 (04/13 0529) BP: (102-130)/(62-82) 130/62 (04/13 0933) SpO2:  [93 %-98 %] 98 % (04/13 0529)  Intake/Output from previous day: 04/12 0701 - 04/13 0700 In: 950 [I.V.:700; IV Piggyback:250] Out: 1050 [Urine:550; Blood:500] Intake/Output this shift: No intake/output data recorded.  Recent Labs    09/26/18 0754 09/27/18 0301 09/28/18 0248  HGB 14.8 14.1 11.5*   Recent Labs    09/27/18 0301 09/28/18 0248  WBC 9.1 13.9*  RBC 4.98 4.07*  HCT 42.3 35.3*  PLT 161 180   Recent Labs    09/27/18 0301 09/28/18 0248  NA 141 140  K 3.8 4.0  CL 109 105  CO2 22 24  BUN 19 28*  CREATININE 0.93 1.37*  GLUCOSE 113* 136*  CALCIUM 9.1 9.1   No results for input(s): LABPT, INR in the last 72 hours.  Sensation intact distally Intact pulses distally Dorsiflexion/Plantar flexion intact Incision: scant drainage   Assessment/Plan: 1 Day Post-Op Procedure(s) (LRB): TOTAL HIP ARTHROPLASTY ANTERIOR APPROACH (Right) Up with therapy WBAT right hip   Mcarthur Rossetti 09/28/2018, 2:01 PM

## 2018-09-28 NOTE — Progress Notes (Signed)
Pt refuses cpap. Rt will continue to monitor.

## 2018-09-28 NOTE — Evaluation (Addendum)
Occupational Therapy Evaluation Patient Details Name: Dominic [name] MRN: [id number] DOB: [birth year] Today's Date: 09/28/2018    History of Present Illness 85 yo male s/p direct anterior hip with greater than 6 falls in 6 months per patient. PMH:   Clinical Impression   Patient is s/p R THA direct anterior surgery resulting in functional limitations due to the deficits listed below (see OT problem list). Pt currently requires stedy with max (A) and bed elevate and strong L lean demonstrated. Pt reports multiple falls at home. Pt could benefit from continued therapy for balance and basic transfer needs.   Patient will benefit from skilled OT acutely to increase independence and safety with ADLS to allow discharge SNF.  Pt was able to order dinner via phone with mod cues.     Follow Up Recommendations  SNF    Equipment Recommendations  3 in 1 bedside commode;Toilet riser;Toilet rise with handles;Wheelchair (measurements OT);Wheelchair cushion (measurements OT)    Recommendations for Other Services       Precautions / Restrictions Precautions Precautions: Fall Restrictions Weight Bearing Restrictions: Yes RLE Weight Bearing: Weight bearing as tolerated      Mobility Bed Mobility Overal bed mobility: Needs Assistance Bed Mobility: Supine to Sit     Supine to sit: Max assist     General bed mobility comments: pt educated on using L LE to help bridge toward EOB. pt able to push down and OT helping lift R LE. pt requires max (A) to elevate trunk and pivot with pad to eob. pt requires sustain support at EOB to remain static sitting. pt reports hx of neck pain  Transfers Overall transfer level: Needs assistance   Transfers: Sit to/from Stand Sit to Stand: Max assist;From elevated surface         General transfer comment: pt able to use bar of stedy to pull up into standing with posterior L lean    Balance                                           ADL  either performed or assessed with clinical judgement   ADL Overall ADL's : Needs assistance/impaired Eating/Feeding: Set up;Sitting   Grooming: Set up;Sitting   Upper Body Bathing: Minimal assistance;Sitting   Lower Body Bathing: Total assistance       Lower Body Dressing: Total assistance   Toilet Transfer: Moderate assistance(stedy )             General ADL Comments: pt requires stedy for safety with transfer. pt with strong L lean. pt reports my wife tells me that too     Vision Baseline Vision/History: No visual deficits       Perception     Praxis      Pertinent Vitals/Pain       Hand Dominance Right   Extremity/Trunk Assessment Upper Extremity Assessment Upper Extremity Assessment: Generalized weakness   Lower Extremity Assessment Lower Extremity Assessment: Defer to PT evaluation;RLE deficits/detail   Cervical / Trunk Assessment Cervical / Trunk Assessment: Kyphotic   Communication Communication Communication: No difficulties   Cognition Arousal/Alertness: Awake/alert Behavior During Therapy: WFL for tasks assessed/performed                                       General Comments  ice  applied to R hip and heated blanket to patients lap    Exercises Exercises: General Lower Extremity General Exercises - Lower Extremity Ankle Circles/Pumps: AROM;Both;10 reps;Seated Hip ABduction/ADduction: AROM;Both;10 reps;Seated Toe Raises: AROM;Both;10 reps;Seated   Shoulder Instructions      Home Living Family/patient expects to be discharged to:: Private residence Living Arrangements: Spouse/significant other Available Help at Discharge: Family Type of Home: House Home Access: Level entry     Home Layout: One level     Bathroom Shower/Tub: Occupational psychologist: Morgan - single point;Walker - 2 wheels          Prior Functioning/Environment Level of Independence: Independent         Comments: reports at least 6 falls in the last 6 months        OT Problem List: Decreased strength;Decreased activity tolerance;Impaired balance (sitting and/or standing);Decreased safety awareness;Decreased knowledge of use of DME or AE;Decreased knowledge of precautions;Pain      OT Treatment/Interventions: Self-care/ADL training;Therapeutic exercise;Neuromuscular education;Energy conservation;DME and/or AE instruction;Manual therapy;Modalities;Therapeutic activities;Patient/family education;Balance training    OT Goals(Current goals can be found in the care plan section) Acute Rehab OT Goals Patient Stated Goal: to return to wife OT Goal Formulation: With patient Time For Goal Achievement: 10/12/18 Potential to Achieve Goals: Good  OT Frequency: Min 2X/week   Barriers to D/C: Decreased caregiver support          Co-evaluation              AM-PAC OT "6 Clicks" Daily Activity     Outcome Measure Help from another person eating meals?: None Help from another person taking care of personal grooming?: A Little Help from another person toileting, which includes using toliet, bedpan, or urinal?: A Lot Help from another person bathing (including washing, rinsing, drying)?: A Lot Help from another person to put on and taking off regular upper body clothing?: A Little Help from another person to put on and taking off regular lower body clothing?: Total 6 Click Score: 15   End of Session Nurse Communication: Mobility status;Precautions  Activity Tolerance: Patient tolerated treatment well Patient left: in chair;with call bell/phone within reach;with chair alarm set  OT Visit Diagnosis: Unsteadiness on feet (R26.81);Muscle weakness (generalized) (M62.81)                Time: 9528-4132 OT Time Calculation (min): 30 min Charges:  OT General Charges $OT Visit: 1 Visit OT Evaluation $OT Eval Moderate Complexity: 1 Mod OT Treatments $Self Care/Home Management : 8-22  mins   Jeri Modena, OTR/L  Acute Rehabilitation Services Pager: [phone] Office: [phone] .   Jeri Modena 09/28/2018, 3:22 PM

## 2018-09-29 ENCOUNTER — Encounter (HOSPITAL_COMMUNITY): Payer: Self-pay | Admitting: General Practice

## 2018-09-29 LAB — CBC
HCT: 26.4 % — ABNORMAL LOW (ref 39.0–52.0)
Hemoglobin: 8.7 g/dL — ABNORMAL LOW (ref 13.0–17.0)
MCH: 28.4 pg (ref 26.0–34.0)
MCHC: 33 g/dL (ref 30.0–36.0)
MCV: 86.3 fL (ref 80.0–100.0)
Platelets: 141 10*3/uL — ABNORMAL LOW (ref 150–400)
RBC: 3.06 MIL/uL — ABNORMAL LOW (ref 4.22–5.81)
RDW: 14.7 % (ref 11.5–15.5)
WBC: 11.1 10*3/uL — ABNORMAL HIGH (ref 4.0–10.5)
nRBC: 0 % (ref 0.0–0.2)

## 2018-09-29 LAB — BASIC METABOLIC PANEL
Anion gap: 7 (ref 5–15)
BUN: 31 mg/dL — ABNORMAL HIGH (ref 8–23)
CO2: 24 mmol/L (ref 22–32)
Calcium: 8.6 mg/dL — ABNORMAL LOW (ref 8.9–10.3)
Chloride: 107 mmol/L (ref 98–111)
Creatinine, Ser: 1.18 mg/dL (ref 0.61–1.24)
GFR calc Af Amer: >60 mL/min (ref >60)
GFR calc non Af Amer: 57 mL/min — ABNORMAL LOW (ref >60)
Glucose, Bld: 119 mg/dL — ABNORMAL HIGH (ref 70–99)
Potassium: 3.7 mmol/L (ref 3.5–5.1)
Sodium: 138 mmol/L (ref 135–145)

## 2018-09-29 LAB — HEMOGLOBIN AND HEMATOCRIT, BLOOD
HCT: 26.4 % — ABNORMAL LOW (ref 39.0–52.0)
Hemoglobin: 8.8 g/dL — ABNORMAL LOW (ref 13.0–17.0)

## 2018-09-29 LAB — MAGNESIUM: Magnesium: 2 mg/dL (ref 1.7–2.4)

## 2018-09-29 NOTE — Progress Notes (Signed)
Occupational Therapy Treatment Patient Details Name: Dominic [name] MRN: [id number] DOB: Dec 11, 1933 Today's Date: 09/29/2018    History of present illness Pt is an 85 y.o. male admitted 09/26/18 after falling sustaining R femoral neck fx; now s/p direct anterior THA. PMH includes afib, CAD, OSA, macular degeneration, prostate CA.   OT comments  Pt progressing towards established OT goals. Pt performing functional mobility to sink with Mod A +2, RW, and VC. Pt sitting at sink to brush his hair with supervision. Pt continues to present with decreased cognition, balance, and safety. Pt pleasant and agreeable to therapy. Continue to recommend dc to SNF and will continue to follow acutely as admitted.    Follow Up Recommendations  SNF    Equipment Recommendations  3 in 1 bedside commode;Toilet riser;Toilet rise with handles;Wheelchair (measurements OT);Wheelchair cushion (measurements OT)    Recommendations for Other Services      Precautions / Restrictions Precautions Precautions: Fall Restrictions Weight Bearing Restrictions: Yes RLE Weight Bearing: Weight bearing as tolerated       Mobility Bed Mobility Overal bed mobility: Needs Assistance Bed Mobility: Supine to Sit     Supine to sit: Mod assist     General bed mobility comments: ModA to assist RLE to EOB and UE support for trunk elevation  Transfers Overall transfer level: Needs assistance Equipment used: Rolling walker (2 wheeled) Transfers: Sit to/from Stand Sit to Stand: Mod assist;+2 physical assistance         General transfer comment: Pt able to stand 3x from bed, recliner and BSC to RW; heavy reliance on UE support to push into standing, modA+2 for trunk elevation and to cue initiation/sequence of movement    Balance Overall balance assessment: Needs assistance;History of Falls   Sitting balance-Leahy Scale: Fair     Standing balance support: Bilateral upper extremity supported Standing balance-Leahy  Scale: Poor Standing balance comment: Reliant on UE support                           ADL either performed or assessed with clinical judgement   ADL Overall ADL's : Needs assistance/impaired     Grooming: Set up;Sitting;Brushing hair;Supervision/safety Grooming Details (indicate cue type and reason): supervision for safety as pt sat at sink to brush hair at [employer] Transfer: Moderate assistance;+2 for physical assistance;BSC;RW Toilet Transfer Details (indicate cue type and reason): Mod A +2 for safety and balance to [facility]         Functional mobility during ADLs: Moderate assistance;+2 for physical assistance;+2 for safety/equipment;Rolling walker General ADL Comments: Pt performing mobility to sink to then sit and brush his hair     Vision       Perception     Praxis      Cognition Arousal/Alertness: Awake/alert Behavior During Therapy: WFL for tasks assessed/performed Overall Cognitive Status: No family/caregiver present to determine baseline cognitive functioning Area of Impairment: Attention;Following commands;Safety/judgement;Awareness;Problem solving                   Current Attention Level: Sustained   Following Commands: Follows one step commands inconsistently;Follows one step commands with increased time Safety/Judgement: Decreased awareness of safety;Decreased awareness of deficits Awareness: Emergent Problem Solving: Decreased initiation;Difficulty sequencing;Requires verbal cues General Comments: Very pleasant. Difficulty initiating and sequencing movements, requiring simple, frequent cues; difficulty multitasking. Wife reports (noted in [place] note), pt forgets to  use RW, forgets sequence of ADLs so wife writes down steps and leave notes for patient        Exercises     Shoulder Instructions       General Comments      Pertinent Vitals/ Pain       Pain Assessment: Faces Faces Pain Scale: Hurts little  more Pain Location: L hip with certain movements Pain Descriptors / Indicators: Grimacing;Guarding;Moaning Pain Intervention(s): Monitored during session;Limited activity within patient's tolerance;Repositioned  Home Living                                          Prior Functioning/Environment              Frequency  Min 2X/week        Progress Toward Goals  OT Goals(current goals can now be found in the care plan section)  Progress towards OT goals: Progressing toward goals  Acute Rehab OT Goals Patient Stated Goal: to return to wife OT Goal Formulation: With patient Time For Goal Achievement: 10/12/18 Potential to Achieve Goals: Good  Plan Discharge plan remains appropriate    Co-evaluation    PT/OT/SLP Co-Evaluation/Treatment: Yes Reason for Co-Treatment: Necessary to address cognition/behavior during functional activity   OT goals addressed during session: ADL's and self-care      AM-PAC OT "6 Clicks" Daily Activity     Outcome Measure   Help from another person eating meals?: None Help from another person taking care of personal grooming?: A Little Help from another person toileting, which includes using toliet, bedpan, or urinal?: A Lot Help from another person bathing (including washing, rinsing, drying)?: A Lot Help from another person to put on and taking off regular upper body clothing?: A Little Help from another person to put on and taking off regular lower body clothing?: Total 6 Click Score: 15    End of Session Equipment Utilized During Treatment: Gait belt;Rolling walker  OT Visit Diagnosis: Unsteadiness on feet (R26.81);Muscle weakness (generalized) (M62.81)   Activity Tolerance Patient tolerated treatment well   Patient Left in chair;with call bell/phone within reach;with chair alarm set   Nurse Communication Mobility status;Precautions(Condom cath off)        Time: 1093-2355 OT Time Calculation (min): 26  min  Charges: OT General Charges $OT Visit: 1 Visit OT Treatments $Self Care/Home Management : 8-22 mins  Banks, OTR/L Acute Rehab Pager: [phone] Office: [place] 09/29/2018, 6:03 PM

## 2018-09-29 NOTE — Plan of Care (Addendum)
  Problem: Pain Management: Goal: Pain level will decrease Outcome: Progressing   Problem: Clinical Measurements: Goal: Diagnostic test results will improve Outcome: Progressing   Problem: Elimination: Goal: Will not experience complications related to bowel motility Outcome: Progressing   Replaced condom cath.  Noted patient referenced as she. Charge nurse updated.

## 2018-09-29 NOTE — Progress Notes (Signed)
Patient ID: Dominic [name], adult   DOB: 05-12-1934, 85 y.o.   MRN: [id number] No acute changes.  His right hip is stable.  I did change the dressing and his hip incision looks good overall.  Can be discharged to skilled nursing from Ortho standpoint.

## 2018-09-29 NOTE — Care Management Important Message (Signed)
Important Message  Patient Details  Name: Dominic [name] MRN: [id number] Date of Birth: 06-Mar-1934   Medicare Important Message Given:  Yes    Orbie Pyo 09/29/2018, 4:33 PM
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
## 2018-09-29 NOTE — Progress Notes (Signed)
Physical Therapy Treatment Patient Details Name: Dominic [name] MRN: [id number] DOB: 03/02/1934 Today's Date: 09/29/2018    History of Present Illness Pt is an 85 y.o. male admitted 09/26/18 after falling sustaining R femoral neck fx; now s/p direct anterior THA. PMH includes afib, CAD, OSA, macular degeneration, prostate CA.   PT Comments    Pt progressing well with mobility. Able to initiate gait training with RW and modA+2. Pt limited by increased RLE pain and difficulty with movement initiation/sequencing; remains pleasant and motivated to participate. Continue to recommend SNF-level therapies to maximize functional mobility and independence.    Follow Up Recommendations  SNF;Supervision/Assistance - 24 hour     Equipment Recommendations  (TBD)    Recommendations for Other Services       Precautions / Restrictions Precautions Precautions: Fall Restrictions Weight Bearing Restrictions: Yes RLE Weight Bearing: Weight bearing as tolerated    Mobility  Bed Mobility Overal bed mobility: Needs Assistance Bed Mobility: Supine to Sit     Supine to sit: Mod assist     General bed mobility comments: ModA to assist RLE to EOB and UE support for trunk elevation  Transfers Overall transfer level: Needs assistance Equipment used: Rolling walker (2 wheeled)   Sit to Stand: Mod assist;+2 physical assistance         General transfer comment: Pt able to stand 3x from bed, recliner and BSC to RW; heavy reliance on UE support to push into standing, modA+2 for trunk elevation and to cue initiation/sequence of movement  Ambulation/Gait Ambulation/Gait assistance: Min assist;Mod assist;+2 physical assistance;+2 safety/equipment Gait Distance (Feet): 30 Feet Assistive device: Rolling walker (2 wheeled) Gait Pattern/deviations: Step-to pattern;Trunk flexed;Leaning posteriorly;Antalgic;Decreased weight shift to right Gait velocity: Decreased Gait velocity interpretation: <1.31  ft/sec, indicative of household ambulator General Gait Details: Amb 53' + 10' + 5' with seated rest breaks in between; reliant on RW and min-modA+2 to maintain balance and assist RW navigation. Pt able to increase bilateral step length when cued for "big step", reluctant to shift weight from LLE to take complete steps   Stairs             Wheelchair Mobility    Modified Rankin (Stroke Patients Only)       Balance Overall balance assessment: Needs assistance;History of Falls   Sitting balance-Leahy Scale: Fair     Standing balance support: Bilateral upper extremity supported Standing balance-Leahy Scale: Poor Standing balance comment: Reliant on UE support                            Cognition Arousal/Alertness: Awake/alert Behavior During Therapy: WFL for tasks assessed/performed Overall Cognitive Status: No family/caregiver present to determine baseline cognitive functioning Area of Impairment: Attention;Following commands;Safety/judgement;Awareness;Problem solving                   Current Attention Level: Sustained   Following Commands: Follows one step commands inconsistently;Follows one step commands with increased time Safety/Judgement: Decreased awareness of safety;Decreased awareness of deficits Awareness: Emergent Problem Solving: Decreased initiation;Difficulty sequencing;Requires verbal cues General Comments: Very pleasant. Difficulty initiating and sequencing movements, requiring simple, frequent cues; difficulty multitasking. Wife reports (noted in [place] note), pt forgets to use RW, forgets sequence of ADLs so wife writes down steps and leave notes for patient      Exercises      General Comments        Pertinent Vitals/Pain Pain Assessment: Faces Faces Pain Scale: Hurts little more  Pain Location: L hip with certain movements Pain Descriptors / Indicators: Grimacing;Guarding;Moaning Pain Intervention(s): Limited activity within  patient's tolerance;Monitored during session    Home Living                      Prior Function            PT Goals (current goals can now be found in the care plan section) Acute Rehab PT Goals Patient Stated Goal: to return to wife PT Goal Formulation: With patient Time For Goal Achievement: 10/12/18 Potential to Achieve Goals: Fair Progress towards PT goals: Progressing toward goals    Frequency    Min 3X/week      PT Plan Current plan remains appropriate    Co-evaluation PT/OT/SLP Co-Evaluation/Treatment: Yes Reason for Co-Treatment: Necessary to address cognition/behavior during functional activity;For patient/therapist safety;To address functional/ADL transfers          AM-PAC PT "6 Clicks" Mobility   Outcome Measure  Help needed turning from your back to your side while in a flat bed without using bedrails?: A Lot Help needed moving from lying on your back to sitting on the side of a flat bed without using bedrails?: A Lot Help needed moving to and from a bed to a chair (including a wheelchair)?: A Lot Help needed standing up from a chair using your arms (e.g., wheelchair or bedside chair)?: A Lot Help needed to walk in hospital room?: A Lot Help needed climbing 3-5 steps with a railing? : Total 6 Click Score: 11    End of Session Equipment Utilized During Treatment: Gait belt Activity Tolerance: Patient tolerated treatment well Patient left: in chair;with call bell/phone within reach;with chair alarm set Nurse Communication: Mobility status PT Visit Diagnosis: Other abnormalities of gait and mobility (R26.89);History of falling (Z91.81);Muscle weakness (generalized) (M62.81)     Time: 3832-9191 PT Time Calculation (min) (ACUTE ONLY): 27 min  Charges:  $Gait Training: 8-22 mins                    Mabeline Caras, PT, DPT Acute Rehabilitation Services  Pager [phone] Office [place] 09/29/2018, 5:34 PM

## 2018-09-29 NOTE — Progress Notes (Signed)
PROGRESS NOTE    Dominic [name]  BTD:[id number] DOB: Aug 28, 1933 DOA: 09/26/2018 PCP: Lawerance Cruel, MD   Brief Narrative:  Dominic [name] is Dominic [name] 85 y.o. adult with medical history significant of atrial fibrillation, CAD, hyperlipidemia, OSA who presents after Dominic [name] fall at home.  He states that he was in the bathroom when he lost his balance and fell.  He denies any loss of consciousness or prodromal symptoms, did not hit his head.  Currently, he is feeling well, pain is well controlled.  He denies any recent illnesses such as fevers, chills, cough, chest pain, shortness of breath, abdominal pain, nausea, vomiting, diarrhea.  ED Course: CT head negative for acute abnormality.  Imaging revealed impacted right femoral neck fracture.  Orthopedic surgery consulted.  He was admitted for Dominic [name] R femoral neck fracture and is now s/p OR with ortho.  D/c pending SNF placement.   Assessment & Plan:   Principal Problem:   Right femoral fracture (HCC) Active Problems:   CAD S/P percutaneous coronary angioplasty   Hypertension   PAF (paroxysmal atrial fibrillation) (HCC)   OSA (obstructive sleep apnea)   Chronic anticoagulation   Hip fracture, right (HCC)   Right femoral neck fracture after mechanical fall at home -S/p R total hip arthroplasty 4/12 with ortho - Eliquis  - PT/OT, pain control  Acute Kidney Injury - Likely hypovolemia, will give fluid challenge and follow in AM - Follow UA  Paroxysmal atrial fibrillation -Eliquis  -Continue Toprol  Essential hypertension -Continue Norvasc  CAD  -Followed by Dr. Martinique as an outpatient.  Has had prior stents in 2006 and 2016. -Plavix  OSA -CPAP nightly  DVT prophylaxis: eliquis Code Status: full  Family Communication: called wife Disposition Plan: pending   Consultants:   orthopedics  Procedures:   Total hip arthroplasty anterior approach 4/12  Antimicrobials: Anti-infectives (From admission, onward)   Start      Dose/Rate Route Frequency Ordered Stop   09/27/18 1400  ceFAZolin (ANCEF) IVPB 2g/100 mL premix     2 g 200 mL/hr over 30 Minutes Intravenous Every 6 hours 09/27/18 1056 09/27/18 2203      Subjective: No complaints.  Asking about breakfast.  Objective: Vitals:   09/28/18 1953 09/29/18 0227 09/29/18 0356 09/29/18 1359  BP: (!) 124/46 (!) 141/63 133/65 127/71  Pulse: 76 75 64 75  Resp: 18 17 18 17   Temp: 98.6 F (37 C) 98.4 F (36.9 C) 98.6 F (37 C) 98.5 F (36.9 C)  TempSrc: Oral Oral Oral Oral  SpO2: 99% 94% 93% 94%  Weight:      Height:        Intake/Output Summary (Last 24 hours) at 09/29/2018 1726 Last data filed at 09/29/2018 1601 Gross per 24 hour  Intake 360 ml  Output 1900 ml  Net -1540 ml   Filed Weights   09/26/18 0738  Weight: 88 kg    Examination:  General: No acute distress. Cardiovascular: Heart sounds show Dominic [name] regular rate, and rhythm.  Lungs: Clear to auscultation bilaterally  Abdomen: Soft, nontender, nondistended  Neurological: Alert and oriented 3. Moves all extremities 4. Cranial nerves II through XII grossly intact. Skin: Warm and dry. No rashes or lesions. Extremities: RLE with intact dressing    Data Reviewed: I have personally reviewed following labs and imaging studies  CBC: Recent Labs  Lab 09/26/18 0754 09/27/18 0301 09/28/18 0248 09/29/18 0303 09/29/18 1254  WBC 5.8 9.1 13.9* 11.1*  --   NEUTROABS 4.0  --   --   --   --  HGB 14.8 14.1 11.5* 8.7* 8.8*  HCT 46.6 42.3 35.3* 26.4* 26.4*  MCV 86.8 84.9 86.7 86.3  --   PLT 161 161 180 141*  --    Basic Metabolic Panel: Recent Labs  Lab 09/26/18 0754 09/27/18 0301 09/28/18 0248 09/29/18 0303  NA 140 141 140 138  K 3.8 3.8 4.0 3.7  CL 105 109 105 107  CO2 24 22 24 24   GLUCOSE 101* 113* 136* 119*  BUN 16 19 28* 31*  CREATININE 0.91 0.93 1.37* 1.18  CALCIUM 9.4 9.1 9.1 8.6*  MG  --   --  2.3 2.0   GFR: Estimated Creatinine Clearance (by C-G formula based on SCr of  1.18 mg/dL) Male: 45.1 mL/min Male: 51.4 mL/min Liver Function Tests: No results for input(s): AST, ALT, ALKPHOS, BILITOT, PROT, ALBUMIN in the last 168 hours. No results for input(s): LIPASE, AMYLASE in the last 168 hours. No results for input(s): AMMONIA in the last 168 hours. Coagulation Profile: No results for input(s): INR, PROTIME in the last 168 hours. Cardiac Enzymes: No results for input(s): CKTOTAL, CKMB, CKMBINDEX, TROPONINI in the last 168 hours. BNP (last 3 results) No results for input(s): PROBNP in the last 8760 hours. HbA1C: No results for input(s): HGBA1C in the last 72 hours. CBG: No results for input(s): GLUCAP in the last 168 hours. Lipid Profile: No results for input(s): CHOL, HDL, LDLCALC, TRIG, CHOLHDL, LDLDIRECT in the last 72 hours. Thyroid Function Tests: No results for input(s): TSH, T4TOTAL, FREET4, T3FREE, THYROIDAB in the last 72 hours. Anemia Panel: No results for input(s): VITAMINB12, FOLATE, FERRITIN, TIBC, IRON, RETICCTPCT in the last 72 hours. Sepsis Labs: No results for input(s): PROCALCITON, LATICACIDVEN in the last 168 hours.  Recent Results (from the past 240 hour(s))  Surgical pcr screen     Status: None   Collection Time: 09/26/18  3:40 PM  Result Value Ref Range Status   MRSA, PCR NEGATIVE NEGATIVE Final   Staphylococcus aureus NEGATIVE NEGATIVE Final    Comment: (NOTE) The Xpert SA Assay (FDA approved for NASAL specimens in patients 41 years of age and older), is one component of Amahri Dengel comprehensive surveillance program. It is not intended to diagnose infection nor to guide or monitor treatment. Performed at [facility], [place] [address], [place], [place] [address]          Radiology Studies: No results found.      Scheduled Meds: . amLODipine  5 mg Oral Daily  . apixaban  5 mg Oral BID  . clopidogrel  75 mg Oral Daily  . docusate sodium  100 mg Oral BID  . metoprolol succinate  75 mg Oral Daily  . pantoprazole   40 mg Oral Daily  . sodium chloride flush  3 mL Intravenous Q12H   Continuous Infusions: . sodium chloride    . methocarbamol (ROBAXIN) IV       LOS: 3 days    Time spent: over 30 min    Fayrene Helper, MD [facility] Pager AMION  If 7PM-7AM, please contact night-coverage [email or web address] Password [facility] 09/29/2018, 5:26 PM

## 2018-09-29 NOTE — Discharge Instructions (Signed)
INSTRUCTIONS AFTER JOINT REPLACEMENT  ° °o Remove items at home which could result in a fall. This includes throw rugs or furniture in walking pathways °o ICE to the affected joint every three hours while awake for 30 minutes at a time, for at least the first 3-5 days, and then as needed for pain and swelling.  Continue to use ice for pain and swelling. You may notice swelling that will progress down to the foot and ankle.  This is normal after surgery.  Elevate your leg when you are not up walking on it.   °o Continue to use the breathing machine you got in the hospital (incentive spirometer) which will help keep your temperature down.  It is common for your temperature to cycle up and down following surgery, especially at night when you are not up moving around and exerting yourself.  The breathing machine keeps your lungs expanded and your temperature down. ° ° °DIET:  As you were doing prior to hospitalization, we recommend a well-balanced diet. ° °DRESSING / WOUND CARE / SHOWERING ° °Keep the surgical dressing until follow up.  The dressing is water proof, so you can shower without any extra covering.  IF THE DRESSING FALLS OFF or the wound gets wet inside, change the dressing with sterile gauze.  Please use good hand washing techniques before changing the dressing.  Do not use any lotions or creams on the incision until instructed by your surgeon.   ° °ACTIVITY ° °o Increase activity slowly as tolerated, but follow the weight bearing instructions below.   °o No driving for 6 weeks or until further direction given by your physician.  You cannot drive while taking narcotics.  °o No lifting or carrying greater than 10 lbs. until further directed by your surgeon. °o Avoid periods of inactivity such as sitting longer than an hour when not asleep. This helps prevent blood clots.  °o You may return to work once you are authorized by your doctor.  ° ° ° °WEIGHT BEARING  ° °Weight bearing as tolerated with assist  device (walker, cane, etc) as directed, use it as long as suggested by your surgeon or therapist, typically at least 4-6 weeks. ° ° °EXERCISES ° °Results after joint replacement surgery are often greatly improved when you follow the exercise, range of motion and muscle strengthening exercises prescribed by your doctor. Safety measures are also important to protect the joint from further injury. Any time any of these exercises cause you to have increased pain or swelling, decrease what you are doing until you are comfortable again and then slowly increase them. If you have problems or questions, call your caregiver or physical therapist for advice.  ° °Rehabilitation is important following a joint replacement. After just a few days of immobilization, the muscles of the leg can become weakened and shrink (atrophy).  These exercises are designed to build up the tone and strength of the thigh and leg muscles and to improve motion. Often times heat used for twenty to thirty minutes before working out will loosen up your tissues and help with improving the range of motion but do not use heat for the first two weeks following surgery (sometimes heat can increase post-operative swelling).  ° °These exercises can be done on a training (exercise) mat, on the floor, on a table or on a bed. Use whatever works the best and is most comfortable for you.    Use music or television while you are exercising so that   the exercises are a pleasant break in your day. This will make your life better with the exercises acting as a break in your routine that you can look forward to.   Perform all exercises about fifteen times, three times per day or as directed.  You should exercise both the operative leg and the other leg as well. ° °Exercises include: °  °• Quad Sets - Tighten up the muscle on the front of the thigh (Quad) and hold for 5-10 seconds.   °• Straight Leg Raises - With your knee straight (if you were given a brace, keep it on),  lift the leg to 60 degrees, hold for 3 seconds, and slowly lower the leg.  Perform this exercise against resistance later as your leg gets stronger.  °• Leg Slides: Lying on your back, slowly slide your foot toward your buttocks, bending your knee up off the floor (only go as far as is comfortable). Then slowly slide your foot back down until your leg is flat on the floor again.  °• Angel Wings: Lying on your back spread your legs to the side as far apart as you can without causing discomfort.  °• Hamstring Strength:  Lying on your back, push your heel against the floor with your leg straight by tightening up the muscles of your buttocks.  Repeat, but this time bend your knee to a comfortable angle, and push your heel against the floor.  You may put a pillow under the heel to make it more comfortable if necessary.  ° °A rehabilitation program following joint replacement surgery can speed recovery and prevent re-injury in the future due to weakened muscles. Contact your doctor or a physical therapist for more information on knee rehabilitation.  ° ° °CONSTIPATION ° °Constipation is defined medically as fewer than three stools per week and severe constipation as less than one stool per week.  Even if you have a regular bowel pattern at home, your normal regimen is likely to be disrupted due to multiple reasons following surgery.  Combination of anesthesia, postoperative narcotics, change in appetite and fluid intake all can affect your bowels.  ° °YOU MUST use at least one of the following options; they are listed in order of increasing strength to get the job done.  They are all available over the counter, and you may need to use some, POSSIBLY even all of these options:   ° °Drink plenty of fluids (prune juice may be helpful) and high fiber foods °Colace 100 mg by mouth twice a day  °Senokot for constipation as directed and as needed Dulcolax (bisacodyl), take with full glass of water  °Miralax (polyethylene glycol)  once or twice a day as needed. ° °If you have tried all these things and are unable to have a bowel movement in the first 3-4 days after surgery call either your surgeon or your primary doctor.   ° °If you experience loose stools or diarrhea, hold the medications until you stool forms back up.  If your symptoms do not get better within 1 week or if they get worse, check with your doctor.  If you experience "the worst abdominal pain ever" or develop nausea or vomiting, please contact the office immediately for further recommendations for treatment. ° ° °ITCHING:  If you experience itching with your medications, try taking only a single pain pill, or even half a pain pill at a time.  You can also use Benadryl over the counter for itching or also to   help with sleep.   TED HOSE STOCKINGS:  Use stockings on both legs until for at least 2 weeks or as directed by physician office. They may be removed at night for sleeping.  MEDICATIONS:  See your medication summary on the After Visit Summary that nursing will review with you.  You may have some home medications which will be placed on hold until you complete the course of blood thinner medication.  It is important for you to complete the blood thinner medication as prescribed.  PRECAUTIONS:  If you experience chest pain or shortness of breath - call 911 immediately for transfer to the hospital emergency department.   If you develop a fever greater that 101 F, purulent drainage from wound, increased redness or drainage from wound, foul odor from the wound/dressing, or calf pain - CONTACT YOUR SURGEON.                                                   FOLLOW-UP APPOINTMENTS:  If you do not already have a post-op appointment, please call the office for an appointment to be seen by your surgeon.  Guidelines for how soon to be seen are listed in your After Visit Summary, but are typically between 1-4 weeks after surgery.  OTHER INSTRUCTIONS:   Knee  Replacement:  Do not place pillow under knee, focus on keeping the knee straight while resting. CPM instructions: 0-90 degrees, 2 hours in the morning, 2 hours in the afternoon, and 2 hours in the evening. Place foam block, curve side up under heel at all times except when in CPM or when walking.  DO NOT modify, tear, cut, or change the foam block in any way.  MAKE SURE YOU:   Understand these instructions.   Get help right away if you are not doing well or get worse.    Thank you for letting us be a part of your medical care team.  It is a privilege we respect greatly.  We hope these instructions will help you stay on track for a fast and full recovery!   Information on my medicine - ELIQUIS (apixaban)  Why was Eliquis prescribed for you? Eliquis was prescribed for you to reduce the risk of a blood clot forming that can cause a stroke if you have a medical condition called atrial fibrillation (a type of irregular heartbeat).  What do You need to know about Eliquis ? Take your Eliquis TWICE DAILY - one tablet in the morning and one tablet in the evening with or without food. If you have difficulty swallowing the tablet whole please discuss with your pharmacist how to take the medication safely.  Take Eliquis exactly as prescribed by your doctor and DO NOT stop taking Eliquis without talking to the doctor who prescribed the medication.  Stopping may increase your risk of developing a stroke.  Refill your prescription before you run out.  After discharge, you should have regular check-up appointments with your healthcare provider that is prescribing your Eliquis.  In the future your dose may need to be changed if your kidney function or weight changes by a significant amount or as you get older.  What do you do if you miss a dose? If you miss a dose, take it as soon as you remember on the same day and resume taking twice daily.  Do not take more than one dose of ELIQUIS at the same time  to make up a missed dose.  Important Safety Information A possible side effect of Eliquis is bleeding. You should call your healthcare provider right away if you experience any of the following: ? Bleeding from an injury or your nose that does not stop. ? Unusual colored urine (red or dark brown) or unusual colored stools (red or black). ? Unusual bruising for unknown reasons. ? A serious fall or if you hit your head (even if there is no bleeding).  Some medicines may interact with Eliquis and might increase your risk of bleeding or clotting while on Eliquis. To help avoid this, consult your healthcare provider or pharmacist prior to using any new prescription or non-prescription medications, including herbals, vitamins, non-steroidal anti-inflammatory drugs (NSAIDs) and supplements.  This website has more information on Eliquis (apixaban): http://[email or web address].com/eliquis/home
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
## 2018-09-29 NOTE — Progress Notes (Signed)
Pt refused X 3. RT will remove order at this time and monitor.

## 2018-09-30 ENCOUNTER — Other Ambulatory Visit: Payer: Self-pay | Admitting: Cardiology

## 2018-09-30 DIAGNOSIS — I480 Paroxysmal atrial fibrillation: Secondary | ICD-10-CM

## 2018-09-30 DIAGNOSIS — G4733 Obstructive sleep apnea (adult) (pediatric): Secondary | ICD-10-CM

## 2018-09-30 DIAGNOSIS — D62 Acute posthemorrhagic anemia: Secondary | ICD-10-CM

## 2018-09-30 DIAGNOSIS — Z7901 Long term (current) use of anticoagulants: Secondary | ICD-10-CM

## 2018-09-30 LAB — COMPREHENSIVE METABOLIC PANEL
ALT: 15 U/L (ref 0–44)
AST: 64 U/L — ABNORMAL HIGH (ref 15–41)
Albumin: 2.4 g/dL — ABNORMAL LOW (ref 3.5–5.0)
Alkaline Phosphatase: 74 U/L (ref 38–126)
Anion gap: 8 (ref 5–15)
BUN: 23 mg/dL (ref 8–23)
CO2: 23 mmol/L (ref 22–32)
Calcium: 8.7 mg/dL — ABNORMAL LOW (ref 8.9–10.3)
Chloride: 109 mmol/L (ref 98–111)
Creatinine, Ser: 0.95 mg/dL (ref 0.61–1.24)
GFR calc Af Amer: >60 mL/min (ref >60)
GFR calc non Af Amer: >60 mL/min (ref >60)
Glucose, Bld: 105 mg/dL — ABNORMAL HIGH (ref 70–99)
Potassium: 3.6 mmol/L (ref 3.5–5.1)
Sodium: 140 mmol/L (ref 135–145)
Total Bilirubin: 1 mg/dL (ref 0.3–1.2)
Total Protein: 5.1 g/dL — ABNORMAL LOW (ref 6.5–8.1)

## 2018-09-30 LAB — CBC
HCT: 24.6 % — ABNORMAL LOW (ref 39.0–52.0)
Hemoglobin: 8 g/dL — ABNORMAL LOW (ref 13.0–17.0)
MCH: 27.9 pg (ref 26.0–34.0)
MCHC: 32.5 g/dL (ref 30.0–36.0)
MCV: 85.7 fL (ref 80.0–100.0)
Platelets: 132 10*3/uL — ABNORMAL LOW (ref 150–400)
RBC: 2.87 MIL/uL — ABNORMAL LOW (ref 4.22–5.81)
RDW: 14.6 % (ref 11.5–15.5)
WBC: 8.4 10*3/uL (ref 4.0–10.5)
nRBC: 0 % (ref 0.0–0.2)

## 2018-09-30 LAB — MAGNESIUM: Magnesium: 2.2 mg/dL (ref 1.7–2.4)

## 2018-09-30 NOTE — Progress Notes (Signed)
PROGRESS NOTE  Dominic [name] DXI:[id number] DOB: [birth year] DOA: 09/26/2018 PCP: Lawerance Cruel, MD  HPI/Recap of past 17 hours: 85 year old male with past medical history of A. fib and sleep apnea and CAD admitted on 4/11 after presenting after patient lost his balance and fell in the bathroom without loss of consciousness but found to have a right femoral neck fracture.  Seen by orthopedic surgery and patient was taken for a Right total hip arthroplasty which was done on 4/12.  Patient tolerated this well without incident.  Patient since doing okay, no complaints today.  Waiting for skilled nursing placement.  Assessment/Plan: Principal Problem:   Right femoral fracture [facility]): Status post right hip arthroplasty.  On Eliquis for DVT prophylaxis.  Seen by PT and OT, waiting for [place] short-term skilled nursing placement Active Problems:   CAD S/P percutaneous coronary angioplasty: On Plavix although also getting low-dose Eliquis.  Monitor closely for bleeding   Hypertension   PAF (paroxysmal atrial fibrillation) ([place]): On Eliquis, continue Toprol   OSA (obstructive sleep apnea), nightly CPAP   Chronic anticoagulation   Hip fracture, right (HCC)  Acute blood loss anemia: Hemoglobin dropped to 8.5 today.  Monitor closely, watching for further drop.  Transfuse for hemoglobin below 7.5 given CAD   Code Status: Full code  Family Communication: Left message for daughter  Disposition Plan: Short-term skilled nursing once bed approved   Consultants:  Orthopedics  Procedures:  Status post right hip arthroplasty done 4/12  Antimicrobials:  Preop Ancef  DVT prophylaxis: Eliquis   Objective: Vitals:   09/30/18 0937 09/30/18 0938  BP: (!) 120/53 (!) 120/53  Pulse:  62  Resp:    Temp:    SpO2:      Intake/Output Summary (Last 24 hours) at 09/30/2018 1541 Last data filed at 09/29/2018 2205 Gross per 24 hour  Intake 238 ml  Output 1050 ml  Net -812 ml   Filed Weights    09/26/18 0738  Weight: 88 kg   Body mass index is 27.06 kg/m.  Exam:   General: Alert and oriented x2, no acute distress  HEENT: Normocephalic and atraumatic, mucous memories are moist  Neck: Supple, no JVD  Cardiovascular: Irregular rhythm, rate controlled  Respiratory: Clear to auscultation bilaterally  Abdomen: Soft, nontender, nondistended, positive bowel sounds  Musculoskeletal: No clubbing or cyanosis or edema  Skin: No skin breaks, tears or lesions  Neuro: No focal deficits  Psychiatry: Appropriate, no evidence of psychoses   Data Reviewed: CBC: Recent Labs  Lab 09/26/18 0754 09/27/18 0301 09/28/18 0248 09/29/18 0303 09/29/18 1254 09/30/18 0154  WBC 5.8 9.1 13.9* 11.1*  --  8.4  NEUTROABS 4.0  --   --   --   --   --   HGB 14.8 14.1 11.5* 8.7* 8.8* 8.0*  HCT 46.6 42.3 35.3* 26.4* 26.4* 24.6*  MCV 86.8 84.9 86.7 86.3  --  85.7  PLT 161 161 180 141*  --  767*   Basic Metabolic Panel: Recent Labs  Lab 09/26/18 0754 09/27/18 0301 09/28/18 0248 09/29/18 0303 09/30/18 0154  NA 140 141 140 138 140  K 3.8 3.8 4.0 3.7 3.6  CL 105 109 105 107 109  CO2 24 22 24 24 23   GLUCOSE 101* 113* 136* 119* 105*  BUN 16 19 28* 31* 23  CREATININE 0.91 0.93 1.37* 1.18 0.95  CALCIUM 9.4 9.1 9.1 8.6* 8.7*  MG  --   --  2.3 2.0 2.2   GFR: Estimated Creatinine  Clearance (by C-G formula based on SCr of 0.95 mg/dL) Male: 56 mL/min Male: 63.9 mL/min Liver Function Tests: Recent Labs  Lab 09/30/18 0154  AST 64*  ALT 15  ALKPHOS 74  BILITOT 1.0  PROT 5.1*  ALBUMIN 2.4*   No results for input(s): LIPASE, AMYLASE in the last 168 hours. No results for input(s): AMMONIA in the last 168 hours. Coagulation Profile: No results for input(s): INR, PROTIME in the last 168 hours. Cardiac Enzymes: No results for input(s): CKTOTAL, CKMB, CKMBINDEX, TROPONINI in the last 168 hours. BNP (last 3 results) No results for input(s): PROBNP in the last 8760 hours. HbA1C:  No results for input(s): HGBA1C in the last 72 hours. CBG: No results for input(s): GLUCAP in the last 168 hours. Lipid Profile: No results for input(s): CHOL, HDL, LDLCALC, TRIG, CHOLHDL, LDLDIRECT in the last 72 hours. Thyroid Function Tests: No results for input(s): TSH, T4TOTAL, FREET4, T3FREE, THYROIDAB in the last 72 hours. Anemia Panel: No results for input(s): VITAMINB12, FOLATE, FERRITIN, TIBC, IRON, RETICCTPCT in the last 72 hours. Urine analysis:    Component Value Date/Time   COLORURINE YELLOW 09/28/2018 1500   APPEARANCEUR HAZY (A) 09/28/2018 1500   LABSPEC 1.024 09/28/2018 1500   PHURINE 5.0 09/28/2018 1500   GLUCOSEU NEGATIVE 09/28/2018 1500   HGBUR NEGATIVE 09/28/2018 1500   BILIRUBINUR NEGATIVE 09/28/2018 1500   KETONESUR NEGATIVE 09/28/2018 1500   PROTEINUR NEGATIVE 09/28/2018 1500   NITRITE NEGATIVE 09/28/2018 1500   LEUKOCYTESUR NEGATIVE 09/28/2018 1500   Sepsis Labs: @LABRCNTIP (procalcitonin:4,lacticidven:4)  ) Recent Results (from the past 240 hour(s))  Surgical pcr screen     Status: None   Collection Time: 09/26/18  3:40 PM  Result Value Ref Range Status   MRSA, PCR NEGATIVE NEGATIVE Final   Staphylococcus aureus NEGATIVE NEGATIVE Final    Comment: (NOTE) The Xpert SA Assay (FDA approved for NASAL specimens in patients 54 years of age and older), is one component of a comprehensive surveillance program. It is not intended to diagnose infection nor to guide or monitor treatment. Performed at [facility], [place] [address], [place], [place] [address]       Studies: No results found.  Scheduled Meds: . amLODipine  5 mg Oral Daily  . apixaban  5 mg Oral BID  . clopidogrel  75 mg Oral Daily  . docusate sodium  100 mg Oral BID  . metoprolol succinate  75 mg Oral Daily  . pantoprazole  40 mg Oral Daily  . sodium chloride flush  3 mL Intravenous Q12H    Continuous Infusions: . sodium chloride    . methocarbamol (ROBAXIN) IV        LOS: 4 days     Annita Brod, MD [facility]  To reach me or the doctor on call, go to: [email or web address] Password [facility]  09/30/2018, 3:41 PM

## 2018-09-30 NOTE — Progress Notes (Signed)
CSW consulted with patient wife Dominic [name] regarding bed offers, Dominic [name] chooses higher rated facility out of SNF bed offers on list, [facility]. [facility] has no beds available until Saturday, they will initiate insurance auth with [employer] [facility] and anticipate getting auth Thursday or Friday.  MD made aware.   [place], [place]

## 2018-09-30 NOTE — Plan of Care (Signed)
  Problem: Pain Management: Goal: Pain level will decrease Outcome: Progressing   Problem: Activity: Goal: Ability to ambulate and perform ADLs will improve Outcome: Progressing   

## 2018-10-01 DIAGNOSIS — I2510 Atherosclerotic heart disease of native coronary artery without angina pectoris: Secondary | ICD-10-CM

## 2018-10-01 DIAGNOSIS — Z9861 Coronary angioplasty status: Secondary | ICD-10-CM

## 2018-10-01 LAB — CBC
HCT: 27.7 % — ABNORMAL LOW (ref 39.0–52.0)
Hemoglobin: 8.8 g/dL — ABNORMAL LOW (ref 13.0–17.0)
MCH: 27.1 pg (ref 26.0–34.0)
MCHC: 31.8 g/dL (ref 30.0–36.0)
MCV: 85.2 fL (ref 80.0–100.0)
Platelets: 188 10*3/uL (ref 150–400)
RBC: 3.25 MIL/uL — ABNORMAL LOW (ref 4.22–5.81)
RDW: 14.4 % (ref 11.5–15.5)
WBC: 8.6 10*3/uL (ref 4.0–10.5)
nRBC: 0 % (ref 0.0–0.2)

## 2018-10-01 LAB — BASIC METABOLIC PANEL
Anion gap: 10 (ref 5–15)
BUN: 20 mg/dL (ref 8–23)
CO2: 19 mmol/L — ABNORMAL LOW (ref 22–32)
Calcium: 8.7 mg/dL — ABNORMAL LOW (ref 8.9–10.3)
Chloride: 109 mmol/L (ref 98–111)
Creatinine, Ser: 0.93 mg/dL (ref 0.61–1.24)
GFR calc Af Amer: >60 mL/min (ref >60)
GFR calc non Af Amer: >60 mL/min (ref >60)
Glucose, Bld: 92 mg/dL (ref 70–99)
Potassium: 4.2 mmol/L (ref 3.5–5.1)
Sodium: 138 mmol/L (ref 135–145)

## 2018-10-01 NOTE — Plan of Care (Signed)
  Problem: Education: Goal: Verbalization of understanding the information provided (i.e., activity precautions, restrictions, etc) will improve Outcome: Progressing Goal: Individualized Educational Video(s) Outcome: Progressing   Problem: Activity: Goal: Ability to ambulate and perform ADLs will improve Outcome: Progressing   Problem: Clinical Measurements: Goal: Postoperative complications will be avoided or minimized Outcome: Progressing   Problem: Self-Concept: Goal: Ability to maintain and perform role responsibilities to the fullest extent possible will improve Outcome: Progressing   Problem: Pain Management: Goal: Pain level will decrease Outcome: Progressing   Problem: Education: Goal: Knowledge of General Education information will improve Description Including pain rating scale, medication(s)/side effects and non-pharmacologic comfort measures Outcome: Progressing   Problem: Health Behavior/Discharge Planning: Goal: Ability to manage health-related needs will improve Outcome: Progressing   Problem: Clinical Measurements: Goal: Ability to maintain clinical measurements within normal limits will improve Outcome: Progressing Goal: Will remain free from infection Outcome: Progressing Goal: Diagnostic test results will improve Outcome: Progressing Goal: Respiratory complications will improve Outcome: Progressing Goal: Cardiovascular complication will be avoided Outcome: Progressing   Problem: Activity: Goal: Risk for activity intolerance will decrease Outcome: Progressing   Problem: Nutrition: Goal: Adequate nutrition will be maintained Outcome: Progressing   Problem: Coping: Goal: Level of anxiety will decrease Outcome: Progressing   Problem: Elimination: Goal: Will not experience complications related to bowel motility Outcome: Progressing Goal: Will not experience complications related to urinary retention Outcome: Progressing   Problem:  Safety: Goal: Ability to remain free from injury will improve Outcome: Progressing   Problem: Skin Integrity: Goal: Risk for impaired skin integrity will decrease Outcome: Progressing

## 2018-10-01 NOTE — Progress Notes (Signed)
Physical Therapy Treatment Patient Details Name: Dominic [name] MRN: [id number] DOB: Oct 19, 1933 Today's Date: 10/01/2018    History of Present Illness Pt is an 85 y.o. male admitted 09/26/18 after falling sustaining R femoral neck fx; now s/p direct anterior THA. PMH includes afib, CAD, OSA, macular degeneration, prostate CA.    PT Comments    Pt progressing well he continues to require +2 assistance to achieve standing.  Good progression of gait training today with intermittent passes to step through sequencing.  Plan for continued strengthening and progression of function next session.  SNF placement remains appropriate to improve strength and function before returning home.    Follow Up Recommendations  SNF;Supervision/Assistance - 24 hour     Equipment Recommendations  (TBD)    Recommendations for Other Services       Precautions / Restrictions Precautions Precautions: Fall Restrictions Weight Bearing Restrictions: Yes RLE Weight Bearing: Weight bearing as tolerated    Mobility  Bed Mobility               General bed mobility comments: Pt seated in recliner on arrival.    Transfers Overall transfer level: Needs assistance Equipment used: Rolling walker (2 wheeled) Transfers: Sit to/from Stand Sit to Stand: Mod assist;+2 safety/equipment         General transfer comment: Pt required assistance to boost into standing with max VCs to weight shitft forward.    Ambulation/Gait Ambulation/Gait assistance: Min assist;Mod assist;+2 safety/equipment Gait Distance (Feet): 80 Feet Assistive device: Rolling walker (2 wheeled) Gait Pattern/deviations: Step-to pattern;Trunk flexed;Leaning posteriorly;Antalgic;Decreased weight shift to right;Step-through pattern Gait velocity: Decreased   General Gait Details: Pt continues with shuffling pattern and intermittent breaks to step through pattern.  Flexed posture noted throughout with cues to correct.  Close chair follow for  safety.  Pt required several standing rest breaks.     Stairs             Wheelchair Mobility    Modified Rankin (Stroke Patients Only)       Balance Overall balance assessment: Needs assistance;History of Falls   Sitting balance-Leahy Scale: Fair       Standing balance-Leahy Scale: Poor                              Cognition Arousal/Alertness: Awake/alert Behavior During Therapy: WFL for tasks assessed/performed Overall Cognitive Status: No family/caregiver present to determine baseline cognitive functioning                                        Exercises General Exercises - Lower Extremity Ankle Circles/Pumps: AROM;Both;10 reps;Supine Quad Sets: AROM;Right;10 reps;Supine Heel Slides: AAROM;Right;10 reps;Supine Hip ABduction/ADduction: AAROM;Right;10 reps;Supine    General Comments        Pertinent Vitals/Pain Pain Assessment: Faces Faces Pain Scale: No hurt Pain Location: denies pain, reports arms feel weak    Home Living                      Prior Function            PT Goals (current goals can now be found in the care plan section) Acute Rehab PT Goals Patient Stated Goal: to return to wife Potential to Achieve Goals: Good Progress towards PT goals: Progressing toward goals    Frequency    Min 3X/week  PT Plan Current plan remains appropriate    Co-evaluation              AM-PAC PT "6 Clicks" Mobility   Outcome Measure  Help needed turning from your back to your side while in a flat bed without using bedrails?: A Lot Help needed moving from lying on your back to sitting on the side of a flat bed without using bedrails?: A Lot Help needed moving to and from a bed to a chair (including a wheelchair)?: A Lot Help needed standing up from a chair using your arms (e.g., wheelchair or bedside chair)?: A Lot Help needed to walk in hospital room?: A Lot Help needed climbing 3-5 steps with a  railing? : A Lot 6 Click Score: 12    End of Session Equipment Utilized During Treatment: Gait belt Activity Tolerance: Patient tolerated treatment well Patient left: in chair;with call bell/phone within reach;with chair alarm set Nurse Communication: Mobility status PT Visit Diagnosis: Other abnormalities of gait and mobility (R26.89);History of falling (Z91.81);Muscle weakness (generalized) (M62.81)     Time: 6333-5456 PT Time Calculation (min) (ACUTE ONLY): 23 min  Charges:  $Gait Training: 8-22 mins $Therapeutic Exercise: 8-22 mins                     Dominic [name], PTA Acute Rehabilitation Services Pager [phone] Office [phone]     Dominic [name] 10/01/2018, 5:44 PM

## 2018-10-01 NOTE — Progress Notes (Signed)
PROGRESS NOTE    Dominic [name]  FTD:[id number] DOB: 06/21/1933 DOA: 09/26/2018 PCP: Lawerance Cruel, MD   Brief Narrative:  Dominic [name] is a 85 y.o. adult with medical history significant of atrial fibrillation, CAD, hyperlipidemia, OSA who presents after a fall at home.  He states that he was in the bathroom when he lost his balance and fell.  He denies any loss of consciousness or prodromal symptoms, did not hit his head.  Currently, he is feeling well, pain is well controlled.  He denies any recent illnesses such as fevers, chills, cough, chest pain, shortness of breath, abdominal pain, nausea, vomiting, diarrhea.  ED Course: CT head negative for acute abnormality.  Imaging revealed impacted right femoral neck fracture.  Orthopedic surgery consulted.  He was admitted for a R femoral neck fracture and is now s/p OR with ortho.  D/c pending SNF placement.   Assessment & Plan:  Right femoral neck fracture after mechanical fall at home -S/p R total hip arthroplasty 4/12 with ortho - Eliquis resumed - PT/OT, pain control -discharge planning, Social work following, -awaiting SNF for Rehab  Acute Kidney Injury - resolved with hydration  Paroxysmal atrial fibrillation -continue Eliquis and Toprol  Essential hypertension -Continue Norvasc  CAD  -Followed by Dr. Martinique as an outpatient.  Has had prior stents in 2006 and 2016. -per Dr.Jordans note " Patient does have significant history of CAD with prior stents. On chronic plavix" , likely the reason he is on plavix and Eliquis  OSA -CPAP nightly  H/o CVA -continue Eliquis  DVT prophylaxis: eliquis Code Status: full  Family Communication: no family at bedside Disposition Plan: SNF when bed available  Consultants:   orthopedics  Procedures:   Total hip arthroplasty anterior approach 4/12  Antimicrobials: Anti-infectives (From admission, onward)   Start     Dose/Rate Route Frequency Ordered Stop   09/27/18  1400  ceFAZolin (ANCEF) IVPB 2g/100 mL premix     2 g 200 mL/hr over 30 Minutes Intravenous Every 6 hours 09/27/18 1056 09/27/18 2203     Gen: Awake, Alert, Oriented X 2, no distress  HEENT: PERRLA, Neck supple, no JVD Lungs: Good air movement bilaterally, CTAB CVS: RRR,No Gallops,Rubs or new Murmurs Abd: soft, Non tender, non distended, BS present Extremities: No edema, R hip with dressing Skin: no new rashes   Objective: Vitals:   09/30/18 2011 10/01/18 0519 10/01/18 1053 10/01/18 1326  BP: (!) 141/63 (!) 145/72 (!) 120/58 (!) 115/58  Pulse: 98 78 77 77  Resp: 17 15  (!) 24  Temp: 99.4 F (37.4 C) 99.5 F (37.5 C)  98.4 F (36.9 C)  TempSrc: Oral Oral  Oral  SpO2: 97% 100% 97% 100%  Weight:      Height:        Intake/Output Summary (Last 24 hours) at 10/01/2018 1342 Last data filed at 10/01/2018 0748 Gross per 24 hour  Intake 360 ml  Output -  Net 360 ml   Filed Weights   09/26/18 0738  Weight: 88 kg    Examination:  General: No acute distress. Cardiovascular: Heart sounds show a regular rate, and rhythm.  Lungs: Clear to auscultation bilaterally  Abdomen: Soft, nontender, nondistended  Neurological: Alert and oriented 3. Moves all extremities 4. Cranial nerves II through XII grossly intact. Skin: Warm and dry. No rashes or lesions. Extremities: RLE with intact dressing    Data Reviewed: I have personally reviewed following labs and imaging studies  CBC: Recent Labs  Lab 09/26/18 0754 09/27/18 0301 09/28/18 0248 09/29/18 0303 09/29/18 1254 09/30/18 0154 10/01/18 0238  WBC 5.8 9.1 13.9* 11.1*  --  8.4 8.6  NEUTROABS 4.0  --   --   --   --   --   --   HGB 14.8 14.1 11.5* 8.7* 8.8* 8.0* 8.8*  HCT 46.6 42.3 35.3* 26.4* 26.4* 24.6* 27.7*  MCV 86.8 84.9 86.7 86.3  --  85.7 85.2  PLT 161 161 180 141*  --  132* 124   Basic Metabolic Panel: Recent Labs  Lab 09/27/18 0301 09/28/18 0248 09/29/18 0303 09/30/18 0154 10/01/18 0238  NA 141 140 138  140 138  K 3.8 4.0 3.7 3.6 4.2  CL 109 105 107 109 109  CO2 22 24 24 23  19*  GLUCOSE 113* 136* 119* 105* 92  BUN 19 28* 31* 23 20  CREATININE 0.93 1.37* 1.18 0.95 0.93  CALCIUM 9.1 9.1 8.6* 8.7* 8.7*  MG  --  2.3 2.0 2.2  --    GFR: Estimated Creatinine Clearance (by C-G formula based on SCr of 0.93 mg/dL) Male: 57.2 mL/min Male: 65.2 mL/min Liver Function Tests: Recent Labs  Lab 09/30/18 0154  AST 64*  ALT 15  ALKPHOS 74  BILITOT 1.0  PROT 5.1*  ALBUMIN 2.4*   No results for input(s): LIPASE, AMYLASE in the last 168 hours. No results for input(s): AMMONIA in the last 168 hours. Coagulation Profile: No results for input(s): INR, PROTIME in the last 168 hours. Cardiac Enzymes: No results for input(s): CKTOTAL, CKMB, CKMBINDEX, TROPONINI in the last 168 hours. BNP (last 3 results) No results for input(s): PROBNP in the last 8760 hours. HbA1C: No results for input(s): HGBA1C in the last 72 hours. CBG: No results for input(s): GLUCAP in the last 168 hours. Lipid Profile: No results for input(s): CHOL, HDL, LDLCALC, TRIG, CHOLHDL, LDLDIRECT in the last 72 hours. Thyroid Function Tests: No results for input(s): TSH, T4TOTAL, FREET4, T3FREE, THYROIDAB in the last 72 hours. Anemia Panel: No results for input(s): VITAMINB12, FOLATE, FERRITIN, TIBC, IRON, RETICCTPCT in the last 72 hours. Sepsis Labs: No results for input(s): PROCALCITON, LATICACIDVEN in the last 168 hours.  Recent Results (from the past 240 hour(s))  Surgical pcr screen     Status: None   Collection Time: 09/26/18  3:40 PM  Result Value Ref Range Status   MRSA, PCR NEGATIVE NEGATIVE Final   Staphylococcus aureus NEGATIVE NEGATIVE Final    Comment: (NOTE) The Xpert SA Assay (FDA approved for NASAL specimens in patients 49 years of age and older), is one component of a comprehensive surveillance program. It is not intended to diagnose infection nor to guide or monitor treatment. Performed at [facility], [place] [address], [place], [place] [address]          Radiology Studies: No results found.      Scheduled Meds: . amLODipine  5 mg Oral Daily  . apixaban  5 mg Oral BID  . clopidogrel  75 mg Oral Daily  . docusate sodium  100 mg Oral BID  . metoprolol succinate  75 mg Oral Daily  . pantoprazole  40 mg Oral Daily  . sodium chloride flush  3 mL Intravenous Q12H   Continuous Infusions: . sodium chloride    . methocarbamol (ROBAXIN) IV       LOS: 5 days    Time spent: 10min    [name], MD [facility]  10/01/2018, 1:42 PM

## 2018-10-02 LAB — CBC
HCT: 26.1 % — ABNORMAL LOW (ref 39.0–52.0)
Hemoglobin: 8.2 g/dL — ABNORMAL LOW (ref 13.0–17.0)
MCH: 27.1 pg (ref 26.0–34.0)
MCHC: 31.4 g/dL (ref 30.0–36.0)
MCV: 86.1 fL (ref 80.0–100.0)
Platelets: 206 10*3/uL (ref 150–400)
RBC: 3.03 MIL/uL — ABNORMAL LOW (ref 4.22–5.81)
RDW: 14.6 % (ref 11.5–15.5)
WBC: 7.6 10*3/uL (ref 4.0–10.5)
nRBC: 0 % (ref 0.0–0.2)

## 2018-10-02 NOTE — Care Management Important Message (Signed)
Important Message  Patient Details  Name: Dominic [name] MRN: [id number] Date of Birth: [birth year]   Medicare Important Message Given:  Yes    Orbie Pyo 10/02/2018, 4:13 PM

## 2018-10-02 NOTE — Progress Notes (Signed)
Nutrition Brief Note  RD pulled to chart due to LOS.   Wt Readings from Last 15 Encounters:  09/26/18 88 kg  07/28/18 88.5 kg  07/13/18 88.5 kg  02/15/18 89.4 kg  09/05/17 88.3 kg  09/02/17 89.8 kg  08/22/17 88.9 kg  07/28/17 88.9 kg  03/25/17 86.2 kg  02/03/17 86.2 kg  06/18/16 83.5 kg  11/23/15 83.9 kg  07/20/15 87.3 kg  06/27/15 88.5 kg  05/04/15 89.3 kg   Dominic [name] a 85 y.o.adultwith medical history significant ofatrial fibrillation, CAD, hyperlipidemia, OSA who presents after a fall at home.He states that he was in the bathroom when he lost his balance and fell. He denies any loss of consciousness or prodromal symptoms, did not hit his head. Currently, he is feeling well, pain is well controlled. He denies any recent illnesses such as fevers, chills, cough, chest pain, shortness of breath, abdominal pain, nausea, vomiting, diarrhea.  4/12- s/p rt total hip arthoplasty   Body mass index is 27.06 kg/m. Patient meets criteria for overweight based on current BMI.   Current diet order is soft, patient is consuming approximately 100% of meals at this time. Labs and medications reviewed.   No nutrition interventions warranted at this time. If nutrition issues arise, please consult RD.   Dominic [name] A. Jimmye Norman, RD, LDN, [place] Registered Dietitian II Certified Diabetes Care and Education Specialist Pager: [phone] After hours Pager: [phone]

## 2018-10-02 NOTE — Progress Notes (Signed)
Occupational Therapy Treatment Patient Details Name: Dominic [name] MRN: [id number] DOB: [birth year] Today's Date: 10/02/2018    History of present illness Pt is an 85 y.o. male admitted 09/26/18 after falling sustaining R femoral neck fx; now s/p direct anterior THA. PMH includes afib, CAD, OSA, macular degeneration, prostate CA.   OT comments  Pt transferred to [facility] bathroom level with void of bladder incontinence. Pt unable to void bowels at this time. Pt with total (A) for LB hygiene and bathing. Pt sitting upright in chair starting at 10:30am and will need repositioning after lunch to prevent skin break down.   Follow Up Recommendations  SNF    Equipment Recommendations  3 in 1 bedside commode;Toilet riser;Toilet rise with handles;Wheelchair (measurements OT);Wheelchair cushion (measurements OT)    Recommendations for Other Services      Precautions / Restrictions Precautions Precautions: Fall Restrictions Weight Bearing Restrictions: Yes RLE Weight Bearing: Weight bearing as tolerated       Mobility Bed Mobility Overal bed mobility: Needs Assistance Bed Mobility: Supine to Sit     Supine to sit: Mod assist     General bed mobility comments: pt requires pad used to help turn hips to EOB and grab bar to pull UB off bed surface  Transfers Overall transfer level: Needs assistance Equipment used: Rolling walker (2 wheeled) Transfers: Sit to/from Stand Sit to Stand: +2 physical assistance;Mod assist         General transfer comment: pt requires (A) to power up from surface and mod cues for hand placement    Balance           Standing balance support: Bilateral upper extremity supported;During functional activity Standing balance-Leahy Scale: Poor Standing balance comment: reliance on RW                           ADL either performed or assessed with clinical judgement   ADL Overall ADL's : Needs assistance/impaired             Lower Body  Bathing: Total assistance       Lower Body Dressing: Total assistance Lower Body Dressing Details (indicate cue type and reason): doff and don new socks due to incontinence Toilet Transfer: Moderate assistance;+2 for physical assistance;BSC;RW;Grab bars Toilet Transfer Details (indicate cue type and reason): pt requires (A) to power up Toileting- Clothing Manipulation and Hygiene: Total assistance       Functional mobility during ADLs: Moderate assistance;Rolling walker General ADL Comments: pt requires (A) to navigate RW     Vision       Perception     Praxis      Cognition Arousal/Alertness: Awake/alert Behavior During Therapy: WFL for tasks assessed/performed Overall Cognitive Status: No family/caregiver present to determine baseline cognitive functioning                                 General Comments: pt reports i think i might be able to go to the bathroom and starts to void bladder with lack of awareness        Exercises     Shoulder Instructions       General Comments      Pertinent Vitals/ Pain       Pain Assessment: Faces Faces Pain Scale: Hurts a little bit Pain Location: R hip Pain Descriptors / Indicators: Grimacing;Sore Pain Intervention(s): Monitored during session;Premedicated before session;Repositioned;Ice applied  Home Living                                          Prior Functioning/Environment              Frequency  Min 2X/week        Progress Toward Goals  OT Goals(current goals can now be found in the care plan section)  Progress towards OT goals: Progressing toward goals  Acute Rehab OT Goals Patient Stated Goal: to call wife OT Goal Formulation: With patient Time For Goal Achievement: 10/12/18 Potential to Achieve Goals: Good ADL Goals Pt Will Perform Grooming: with min guard assist;standing Pt Will Perform Lower Body Dressing: with mod assist;sit to/from stand Pt Will Transfer to  Toilet: with min assist;ambulating;bedside commode Pt Will Perform Toileting - Clothing Manipulation and hygiene: with min assist;sit to/from stand Additional ADL Goal #1: Pt will perform bed mobility with Min Guard A in preparation for ADLs  Plan Discharge plan remains appropriate    Co-evaluation                 AM-PAC OT "6 Clicks" Daily Activity     Outcome Measure   Help from another person eating meals?: None Help from another person taking care of personal grooming?: A Little Help from another person toileting, which includes using toliet, bedpan, or urinal?: A Lot Help from another person bathing (including washing, rinsing, drying)?: A Lot Help from another person to put on and taking off regular upper body clothing?: A Little Help from another person to put on and taking off regular lower body clothing?: Total 6 Click Score: 15    End of Session Equipment Utilized During Treatment: Gait belt;Rolling walker  OT Visit Diagnosis: Unsteadiness on feet (R26.81);Muscle weakness (generalized) (M62.81)   Activity Tolerance Patient tolerated treatment well   Patient Left in chair;with call bell/phone within reach;with chair alarm set   Nurse Communication Mobility status;Precautions;Weight bearing status        Time: 0354-6568 OT Time Calculation (min): 24 min  Charges: OT General Charges $OT Visit: 1 Visit OT Treatments $Self Care/Home Management : 23-37 mins   Jeri Modena, OTR/L  Acute Rehabilitation Services Pager: [phone] Office: [phone] .    Jeri Modena 10/02/2018, 11:18 AM

## 2018-10-02 NOTE — Progress Notes (Signed)
PROGRESS NOTE    DAVINCI [name]  ZOX:[id number] DOB: [birth year] DOA: 09/26/2018 PCP: Lawerance Cruel, MD   Brief Narrative:  Dominic [name] is a 85 y.o. adult with medical history significant of atrial fibrillation, CAD, hyperlipidemia, OSA who presents after a fall at home.  He states that he was in the bathroom when he lost his balance and fell.   Imaging revealed impacted right femoral neck fracture.  Orthopedic surgery consulted. He was admitted for a R femoral neck fracture and is now s/p OR with ortho.  D/c pending SNF placement.   Assessment & Plan:  Right femoral neck fracture after mechanical fall at home -S/p R total hip arthroplasty 4/12  - Eliquis resumed -Stable, pain controlled, tolerating physical therapy -Discharge planning, awaiting SNF for rehabilitation  acute blood loss anemia -Due to postop blood loss and hemodilution, -Stable, monitor  Acute Kidney Injury - resolved with hydration  Paroxysmal atrial fibrillation -continue Eliquis and Toprol  Essential hypertension -Continue Norvasc  CAD  -Followed by Dr. Martinique as an outpatient.  Has had prior stents in 2006 and 2016. -per Dr.Jordans note " Patient does have significant history of CAD with prior stents. On chronic plavix" , likely the reason he is on plavix and Eliquis  OSA -CPAP nightly  H/o CVA -continue Eliquis  DVT prophylaxis: eliquis Code Status: full  Family Communication: no family at bedside Disposition Plan: SNF when bed available  Consultants:   orthopedics  Procedures:   Total hip arthroplasty anterior approach 4/12  Antimicrobials: Anti-infectives (From admission, onward)   Start     Dose/Rate Route Frequency Ordered Stop   09/27/18 1400  ceFAZolin (ANCEF) IVPB 2g/100 mL premix     2 g 200 mL/hr over 30 Minutes Intravenous Every 6 hours 09/27/18 1056 09/27/18 2203     Gen: Awake, Alert, Oriented X 2 distress HEENT: PERRLA, Neck supple, no JVD Lungs: Good air  movement bilaterally, CTAB CVS: RRR,No Gallops,Rubs or new Murmurs Abd: soft, Non tender, non distended, BS present Extremities: Right hip with dressing Skin: no new rashes   Objective: Vitals:   10/01/18 2002 10/02/18 0341 10/02/18 0813 10/02/18 1400  BP: 128/60 130/61 (!) 138/59 132/60  Pulse: 79 70  66  Resp: 16 14  16   Temp: 98.7 F (37.1 C) 98.1 F (36.7 C)  97.8 F (36.6 C)  TempSrc: Oral Oral  Oral  SpO2: 97% 96%  96%  Weight:      Height:        Intake/Output Summary (Last 24 hours) at 10/02/2018 1439 Last data filed at 10/02/2018 1300 Gross per 24 hour  Intake 570 ml  Output 1525 ml  Net -955 ml   Filed Weights   09/26/18 0738  Weight: 88 kg    Examination:  General: No acute distress. Cardiovascular: Heart sounds show a regular rate, and rhythm.  Lungs: Clear to auscultation bilaterally  Abdomen: Soft, nontender, nondistended  Neurological: Alert and oriented 3. Moves all extremities 4. Cranial nerves II through XII grossly intact. Skin: Warm and dry. No rashes or lesions. Extremities: RLE with intact dressing    Data Reviewed: I have personally reviewed following labs and imaging studies  CBC: Recent Labs  Lab 09/26/18 0754  09/28/18 0248 09/29/18 0303 09/29/18 1254 09/30/18 0154 10/01/18 0238 10/02/18 0052  WBC 5.8   < > 13.9* 11.1*  --  8.4 8.6 7.6  NEUTROABS 4.0  --   --   --   --   --   --   --  HGB 14.8   < > 11.5* 8.7* 8.8* 8.0* 8.8* 8.2*  HCT 46.6   < > 35.3* 26.4* 26.4* 24.6* 27.7* 26.1*  MCV 86.8   < > 86.7 86.3  --  85.7 85.2 86.1  PLT 161   < > 180 141*  --  132* 188 206   < > = values in this interval not displayed.   Basic Metabolic Panel: Recent Labs  Lab 09/27/18 0301 09/28/18 0248 09/29/18 0303 09/30/18 0154 10/01/18 0238  NA 141 140 138 140 138  K 3.8 4.0 3.7 3.6 4.2  CL 109 105 107 109 109  CO2 22 24 24 23  19*  GLUCOSE 113* 136* 119* 105* 92  BUN 19 28* 31* 23 20  CREATININE 0.93 1.37* 1.18 0.95 0.93   CALCIUM 9.1 9.1 8.6* 8.7* 8.7*  MG  --  2.3 2.0 2.2  --    GFR: Estimated Creatinine Clearance: 65.2 mL/min (by C-G formula based on SCr of 0.93 mg/dL). Liver Function Tests: Recent Labs  Lab 09/30/18 0154  AST 64*  ALT 15  ALKPHOS 74  BILITOT 1.0  PROT 5.1*  ALBUMIN 2.4*   No results for input(s): LIPASE, AMYLASE in the last 168 hours. No results for input(s): AMMONIA in the last 168 hours. Coagulation Profile: No results for input(s): INR, PROTIME in the last 168 hours. Cardiac Enzymes: No results for input(s): CKTOTAL, CKMB, CKMBINDEX, TROPONINI in the last 168 hours. BNP (last 3 results) No results for input(s): PROBNP in the last 8760 hours. HbA1C: No results for input(s): HGBA1C in the last 72 hours. CBG: No results for input(s): GLUCAP in the last 168 hours. Lipid Profile: No results for input(s): CHOL, HDL, LDLCALC, TRIG, CHOLHDL, LDLDIRECT in the last 72 hours. Thyroid Function Tests: No results for input(s): TSH, T4TOTAL, FREET4, T3FREE, THYROIDAB in the last 72 hours. Anemia Panel: No results for input(s): VITAMINB12, FOLATE, FERRITIN, TIBC, IRON, RETICCTPCT in the last 72 hours. Sepsis Labs: No results for input(s): PROCALCITON, LATICACIDVEN in the last 168 hours.  Recent Results (from the past 240 hour(s))  Surgical pcr screen     Status: None   Collection Time: 09/26/18  3:40 PM  Result Value Ref Range Status   MRSA, PCR NEGATIVE NEGATIVE Final   Staphylococcus aureus NEGATIVE NEGATIVE Final    Comment: (NOTE) The Xpert SA Assay (FDA approved for NASAL specimens in patients 41 years of age and older), is one component of a comprehensive surveillance program. It is not intended to diagnose infection nor to guide or monitor treatment. Performed at [facility], [place] [address] [place], [place] [address]          Radiology Studies: No results found.      Scheduled Meds: . amLODipine  5 mg Oral Daily  . apixaban  5 mg Oral BID  .  clopidogrel  75 mg Oral Daily  . docusate sodium  100 mg Oral BID  . metoprolol succinate  75 mg Oral Daily  . pantoprazole  40 mg Oral Daily  . sodium chloride flush  3 mL Intravenous Q12H   Continuous Infusions: . sodium chloride       LOS: 6 days    Time spent: 83min    Dominic Polite, MD [facility]  10/02/2018, 2:39 PM

## 2018-10-02 NOTE — Progress Notes (Signed)
Patient ID: Dominic [name], adult   DOB: 1934-04-11, 85 y.o.   MRN: [id number] No acute changes.  Hip stable.  Working with therapy.  Hgb/hct stable and vitals stable.  Awaiting short-term SNF placement.

## 2018-10-02 NOTE — Plan of Care (Signed)
  Problem: Pain Management: Goal: Pain level will decrease Outcome: Progressing   

## 2018-10-03 DIAGNOSIS — D62 Acute posthemorrhagic anemia: Secondary | ICD-10-CM | POA: Diagnosis not present

## 2018-10-03 DIAGNOSIS — Z471 Aftercare following joint replacement surgery: Secondary | ICD-10-CM | POA: Diagnosis not present

## 2018-10-03 DIAGNOSIS — Z8546 Personal history of malignant neoplasm of prostate: Secondary | ICD-10-CM | POA: Diagnosis not present

## 2018-10-03 DIAGNOSIS — R2681 Unsteadiness on feet: Secondary | ICD-10-CM | POA: Diagnosis not present

## 2018-10-03 DIAGNOSIS — J9811 Atelectasis: Secondary | ICD-10-CM | POA: Diagnosis not present

## 2018-10-03 DIAGNOSIS — R531 Weakness: Secondary | ICD-10-CM | POA: Diagnosis not present

## 2018-10-03 DIAGNOSIS — R600 Localized edema: Secondary | ICD-10-CM | POA: Diagnosis not present

## 2018-10-03 DIAGNOSIS — R05 Cough: Secondary | ICD-10-CM | POA: Diagnosis not present

## 2018-10-03 DIAGNOSIS — R278 Other lack of coordination: Secondary | ICD-10-CM | POA: Diagnosis not present

## 2018-10-03 DIAGNOSIS — M79605 Pain in left leg: Secondary | ICD-10-CM | POA: Diagnosis present

## 2018-10-03 DIAGNOSIS — R1311 Dysphagia, oral phase: Secondary | ICD-10-CM | POA: Diagnosis not present

## 2018-10-03 DIAGNOSIS — M7989 Other specified soft tissue disorders: Secondary | ICD-10-CM | POA: Diagnosis not present

## 2018-10-03 DIAGNOSIS — R609 Edema, unspecified: Secondary | ICD-10-CM | POA: Diagnosis not present

## 2018-10-03 DIAGNOSIS — L03116 Cellulitis of left lower limb: Secondary | ICD-10-CM | POA: Diagnosis not present

## 2018-10-03 DIAGNOSIS — Z79899 Other long term (current) drug therapy: Secondary | ICD-10-CM | POA: Diagnosis not present

## 2018-10-03 DIAGNOSIS — I6389 Other cerebral infarction: Secondary | ICD-10-CM | POA: Diagnosis not present

## 2018-10-03 DIAGNOSIS — Z955 Presence of coronary angioplasty implant and graft: Secondary | ICD-10-CM | POA: Diagnosis not present

## 2018-10-03 DIAGNOSIS — Z9861 Coronary angioplasty status: Secondary | ICD-10-CM | POA: Diagnosis not present

## 2018-10-03 DIAGNOSIS — G4733 Obstructive sleep apnea (adult) (pediatric): Secondary | ICD-10-CM | POA: Diagnosis not present

## 2018-10-03 DIAGNOSIS — R41841 Cognitive communication deficit: Secondary | ICD-10-CM | POA: Diagnosis not present

## 2018-10-03 DIAGNOSIS — M2550 Pain in unspecified joint: Secondary | ICD-10-CM | POA: Diagnosis not present

## 2018-10-03 DIAGNOSIS — D6489 Other specified anemias: Secondary | ICD-10-CM | POA: Diagnosis not present

## 2018-10-03 DIAGNOSIS — L03115 Cellulitis of right lower limb: Secondary | ICD-10-CM | POA: Diagnosis not present

## 2018-10-03 DIAGNOSIS — Z96641 Presence of right artificial hip joint: Secondary | ICD-10-CM | POA: Diagnosis not present

## 2018-10-03 DIAGNOSIS — N178 Other acute kidney failure: Secondary | ICD-10-CM | POA: Diagnosis not present

## 2018-10-03 DIAGNOSIS — I119 Hypertensive heart disease without heart failure: Secondary | ICD-10-CM | POA: Diagnosis not present

## 2018-10-03 DIAGNOSIS — M79672 Pain in left foot: Secondary | ICD-10-CM | POA: Diagnosis not present

## 2018-10-03 DIAGNOSIS — S72001A Fracture of unspecified part of neck of right femur, initial encounter for closed fracture: Secondary | ICD-10-CM | POA: Diagnosis not present

## 2018-10-03 DIAGNOSIS — H3530 Unspecified macular degeneration: Secondary | ICD-10-CM | POA: Diagnosis not present

## 2018-10-03 DIAGNOSIS — W19XXXA Unspecified fall, initial encounter: Secondary | ICD-10-CM | POA: Diagnosis not present

## 2018-10-03 DIAGNOSIS — R5381 Other malaise: Secondary | ICD-10-CM | POA: Diagnosis not present

## 2018-10-03 DIAGNOSIS — I10 Essential (primary) hypertension: Secondary | ICD-10-CM | POA: Diagnosis not present

## 2018-10-03 DIAGNOSIS — Z7401 Bed confinement status: Secondary | ICD-10-CM | POA: Diagnosis not present

## 2018-10-03 DIAGNOSIS — Z7901 Long term (current) use of anticoagulants: Secondary | ICD-10-CM | POA: Diagnosis not present

## 2018-10-03 DIAGNOSIS — I2510 Atherosclerotic heart disease of native coronary artery without angina pectoris: Secondary | ICD-10-CM | POA: Diagnosis not present

## 2018-10-03 DIAGNOSIS — E785 Hyperlipidemia, unspecified: Secondary | ICD-10-CM | POA: Diagnosis not present

## 2018-10-03 DIAGNOSIS — S72001D Fracture of unspecified part of neck of right femur, subsequent encounter for closed fracture with routine healing: Secondary | ICD-10-CM | POA: Diagnosis not present

## 2018-10-03 DIAGNOSIS — R2689 Other abnormalities of gait and mobility: Secondary | ICD-10-CM | POA: Diagnosis not present

## 2018-10-03 DIAGNOSIS — M6281 Muscle weakness (generalized): Secondary | ICD-10-CM | POA: Diagnosis not present

## 2018-10-03 DIAGNOSIS — R262 Difficulty in walking, not elsewhere classified: Secondary | ICD-10-CM | POA: Diagnosis not present

## 2018-10-03 DIAGNOSIS — I480 Paroxysmal atrial fibrillation: Secondary | ICD-10-CM | POA: Diagnosis not present

## 2018-10-03 DIAGNOSIS — B349 Viral infection, unspecified: Secondary | ICD-10-CM | POA: Diagnosis not present

## 2018-10-03 DIAGNOSIS — Z9181 History of falling: Secondary | ICD-10-CM | POA: Diagnosis not present

## 2018-10-03 MED ORDER — HYDROCODONE-ACETAMINOPHEN 7.5-325 MG PO TABS: 1.0000 | ORAL_TABLET | Freq: Four times a day (QID) | ORAL | 0 refills | Status: AC | PRN

## 2018-10-03 MED ORDER — SENNA 8.6 MG PO TABS: 1.0000 | ORAL_TABLET | Freq: Every day | ORAL | 0 refills | Status: AC

## 2018-10-03 NOTE — Progress Notes (Signed)
[name] to be D/C'd Skilled nursing facility per MD order.  Discussed prescriptions and follow up appointments with the patient. Prescriptions given to patient, medication list explained in detail. Pt verbalized understanding.  Allergies as of 10/03/2018      Reactions   Statins Other (See Comments)   Myalgia   Zetia [ezetimibe] Other (See Comments)   myalgia      Medication List    TAKE these medications   amLODipine 5 MG tablet Commonly known as:  NORVASC TAKE 1 TABLET BY MOUTH EVERY DAY   apixaban 5 MG Tabs tablet Commonly known as:  Eliquis Take 1 tablet (5 mg total) by mouth 2 (two) times daily. PLEASE SCHEDULE FOLLOW UP WITH CARDIOLOGIST PRIOR TO NEXT REFILL AUTHORIZATION. What changed:  additional instructions   clopidogrel 75 MG tablet Commonly known as:  PLAVIX Take 1 tablet (75 mg total) by mouth daily. Please call and schedule an appt for further refills.. 1st attempt What changed:  additional instructions   HYDROcodone-acetaminophen 7.5-325 MG tablet Commonly known as:  NORCO Take 1 tablet by mouth every 6 (six) hours as needed for severe pain (pain score 7-10).   metoprolol succinate 25 MG 24 hr tablet Commonly known as:  TOPROL-XL Take 3 tablets (75 mg total) by mouth daily.   nitroGLYCERIN 0.4 MG SL tablet Commonly known as:  NITROSTAT PLACE ONE TABLET UNDER THE TONGUE EVERY 5 MINUTES AS NEEDED FOR CHEST PAIN What changed:  See the new instructions.   PreserVision AREDS Caps Take 1 capsule by mouth 2 (two) times daily.   senna 8.6 MG Tabs tablet Commonly known as:  SENOKOT Take 1 tablet (8.6 mg total) by mouth daily.   tiZANidine 2 MG tablet Commonly known as:  ZANAFLEX Take 2 mg by mouth every 6 (six) hours as needed for muscle spasms.   Ubiquinol 100 MG Caps Take 100 mg by mouth 2 (two) times daily.   vitamin B-12 1000 MCG tablet Commonly known as:  CYANOCOBALAMIN Take 1,000 mcg by mouth daily.       Vitals:   10/03/18 0400 10/03/18  1114  BP: 132/62 127/62  Pulse: 70 88  Resp: 16   Temp: 98.8 F (37.1 C)   SpO2: 98%     Skin clean, dry and intact without evidence of skin break down, no evidence of skin tears noted. IV catheter discontinued intact. Site without signs and symptoms of complications. Dressing and pressure applied. Aquacel dressing on right leg, old drainage marked, dry, and intact.  Pt denies pain at this time. No complaints noted.  An After Visit Summary was printed and given to [place] Patient escorted via stretcher, and D/C to SNF [facility]) SW stated that wife is aware.  [place] RN

## 2018-10-03 NOTE — Progress Notes (Signed)
Clinical Social Worker facilitated patient discharge including contacting patient family and facility to confirm patient discharge plans.  Clinical information faxed to facility and family agreeable with plan.  CSW arranged ambulance transport via [facility]. RN to call for report prior to discharge ([phone]; 102P).  Clinical Social Worker will sign off for now as social work intervention is no longer needed. Please consult [place] again if new need arises.  Mahamad Mariateresa Batra LCSW [phone]

## 2018-10-03 NOTE — Discharge Summary (Signed)
Physician Discharge Summary  Dominic [name] GYI:[id number] DOB: 14-Dec-1933 DOA: 09/26/2018  PCP: Lawerance Cruel, MD  Admit date: 09/26/2018 Discharge date: 10/03/2018  Time spent: 35 minutes  Recommendations for Outpatient Follow-up:  1. PCP in 1 week 2. Ortho Dr.Blackman in 10days 3. Cardiology Dr.Jordan in 63month   Discharge Diagnoses:  Principal Problem:   Right femoral fracture (HCC) Active Problems:   CAD S/P percutaneous coronary angioplasty   Hypertension   PAF (paroxysmal atrial fibrillation) (HCC)   OSA (obstructive sleep apnea)   Chronic anticoagulation   Hip fracture, right [facility])   Discharge Condition: stable  Diet recommendation: heart healthy  Filed Weights   09/26/18 0738  Weight: 88 kg    History of present illness:  Dominic [name] a 85 y.o.adultwith medical history significant ofatrial fibrillation, CAD, hyperlipidemia, OSA who presents after a fall at home.He states that he was in the bathroom when he lost his balance and fell.  Imaging revealed impacted right femoral neck fracture. Orthopedic surgery consulted.  Hospital Course:  Right femoral neck fracture after mechanical fall at home -S/p R total hip arthroplasty 4/12  - Eliquis resumed -Stable, pain controlled, tolerating physical therapy -Discharge planning, awaiting SNF for rehabilitation  acute blood loss anemia -Due to postop blood loss and hemodilution, -Stable, monitor  Acute Kidney Injury - resolved with hydration  Paroxysmal atrial fibrillation -continue Eliquis and Toprol  Essential hypertension -Continue Norvasc  CAD -Followed by Dr. Martinique as an outpatient. Has had prior stents in 2006 and 2016. -per Dr.Jordans note " Patient does have significant history of CAD with prior stents. On chronic plavix" , likely the reason he is on plavix and Eliquis  OSA -CPAP nightly  H/o CVA -continue Eliquis   Consultants:   orthopedics  Procedures:    Total hip arthroplasty anterior approach 4/12  Discharge Exam: Vitals:   10/03/18 0400 10/03/18 1114  BP: 132/62 127/62  Pulse: 70 88  Resp: 16   Temp: 98.8 F (37.1 C)   SpO2: 98%     General: AAOx2 Cardiovascular: S1S2/RRR Respiratory: CTAB  Discharge Instructions   Discharge Instructions    Diet - low sodium heart healthy   Complete by:  As directed    Increase activity slowly   Complete by:  As directed      Allergies as of 10/03/2018      Reactions   Statins Other (See Comments)   Myalgia   Zetia [ezetimibe] Other (See Comments)   myalgia      Medication List    TAKE these medications   amLODipine 5 MG tablet Commonly known as:  NORVASC TAKE 1 TABLET BY MOUTH EVERY DAY   apixaban 5 MG Tabs tablet Commonly known as:  Eliquis Take 1 tablet (5 mg total) by mouth 2 (two) times daily. PLEASE SCHEDULE FOLLOW UP WITH CARDIOLOGIST PRIOR TO NEXT REFILL AUTHORIZATION. What changed:  additional instructions   clopidogrel 75 MG tablet Commonly known as:  PLAVIX Take 1 tablet (75 mg total) by mouth daily. Please call and schedule an appt for further refills.. 1st attempt What changed:  additional instructions   HYDROcodone-acetaminophen 7.5-325 MG tablet Commonly known as:  NORCO Take 1 tablet by mouth every 6 (six) hours as needed for severe pain (pain score 7-10).   metoprolol succinate 25 MG 24 hr tablet Commonly known as:  TOPROL-XL Take 3 tablets (75 mg total) by mouth daily.   nitroGLYCERIN 0.4 MG SL tablet Commonly known as:  NITROSTAT PLACE ONE TABLET UNDER  THE TONGUE EVERY 5 MINUTES AS NEEDED FOR CHEST PAIN What changed:  See the new instructions.   PreserVision AREDS Caps Take 1 capsule by mouth 2 (two) times daily.   senna 8.6 MG Tabs tablet Commonly known as:  SENOKOT Take 1 tablet (8.6 mg total) by mouth daily.   tiZANidine 2 MG tablet Commonly known as:  ZANAFLEX Take 2 mg by mouth every 6 (six) hours as needed for muscle spasms.    Ubiquinol 100 MG Caps Take 100 mg by mouth 2 (two) times daily.   vitamin B-12 1000 MCG tablet Commonly known as:  CYANOCOBALAMIN Take 1,000 mcg by mouth daily.      Allergies  Allergen Reactions  . Statins Other (See Comments)    Myalgia  . Zetia [Ezetimibe] Other (See Comments)    myalgia    Contact information for follow-up providers    Mcarthur Rossetti, MD. Schedule an appointment as soon as possible for a visit in 2 week(s).   Specialty:  Orthopedic Surgery Contact information: [place] [address] [phone]        Lawerance Cruel, MD. Schedule an appointment as soon as possible for a visit in 1 week(s).   Specialty:  Family Medicine Contact information: [address] [place] [address] [phone]        Martinique, Peter M, MD .   Specialty:  Cardiology Contact information: [address] [place] [address] [phone]            Contact information for after-discharge care    Destination    [facility] Preferred SNF .   Service:  Skilled Nursing Contact information: [place] [phone]                   The results of significant diagnostics from this hospitalization (including imaging, microbiology, ancillary and laboratory) are listed below for reference.    Significant Diagnostic Studies: Dg Chest 1 View  Result Date: 09/26/2018 CLINICAL DATA:  85 year old who fell early this morning while going to the bathroom, landing on his RIGHT side. Initial encounter. EXAM: CHEST  1 VIEW COMPARISON:  09/03/2017 and earlier. FINDINGS: AP supine examination was performed. Cardiac silhouette upper normal in size to slightly enlarged for AP technique, unchanged. Thoracic aorta mildly atherosclerotic, unchanged. Hilar and mediastinal contours otherwise unremarkable. Stable chronic elevation of the RIGHT hemidiaphragm and chronic scar/atelectasis  involving the RIGHT lung base. Lungs otherwise clear. Normal pulmonary vascularity. No visible pleural effusions. IMPRESSION: 1. No acute cardiopulmonary disease. 2. Stable chronic elevation of the RIGHT hemidiaphragm and chronic scar/atelectasis involving the RIGHT lung base. Electronically Signed   By: Evangeline Dakin M.D.   On: 09/26/2018 09:21   Ct Head Wo Contrast  Result Date: 09/26/2018 CLINICAL DATA:  Head trauma.  Fall. EXAM: CT HEAD WITHOUT CONTRAST TECHNIQUE: Contiguous axial images were obtained from the base of the skull through the vertex without intravenous contrast. COMPARISON:  07/13/2018 head CT. FINDINGS: Brain: No evidence of parenchymal hemorrhage or extra-axial fluid collection. No mass lesion, mass effect, or midline shift. No CT evidence of acute infarction. Generalized cerebral volume loss. Nonspecific mild subcortical and periventricular white matter hypodensity, most in keeping with chronic small vessel ischemic change. Cerebral ventricle sizes are stable and concordant with the degree of cerebral volume loss. Vascular: No acute abnormality. Skull: No evidence of calvarial fracture. Sinuses/Orbits: No fluid levels. Mucoperiosteal thickening and mucous retention cysts versus polyps in inferior  maxillary sinuses bilaterally. Other:  The mastoid air cells are unopacified. IMPRESSION: 1. No evidence of acute intracranial abnormality. No evidence of calvarial fracture. 2. Generalized cerebral volume loss and chronic mild small vessel ischemic changes in the cerebral white matter. 3. Mild chronic paranasal sinusitis. Electronically Signed   By: Ilona Sorrel M.D.   On: 09/26/2018 08:57   Pelvis Portable  Result Date: 09/27/2018 CLINICAL DATA:  Right hip replacement EXAM: PORTABLE PELVIS 1-2 VIEWS COMPARISON:  09/26/2018 FINDINGS: Right hip replacement satisfactory position and alignment. No fracture or complication. IMPRESSION: Satisfactory right hip replacement for fracture.  Electronically Signed   By: Franchot Gallo M.D.   On: 09/27/2018 11:35   Dg Knee Complete 4 Views Right  Result Date: 09/26/2018 CLINICAL DATA:  85 year old who fell earlier this morning while going to the bathroom, injuring the RIGHT knee. Prior RIGHT ANTERIOR cruciate ligament repair. Initial encounter. EXAM: RIGHT KNEE - COMPLETE 4+ VIEW COMPARISON:  11/16/2005. FINDINGS: Postsurgical changes related to the ACL repair. Mild prepatellar soft tissue swelling. No evidence of acute fracture or dislocation. Mild to moderate tricompartment joint space narrowing. Bone mineral density well preserved for patient age. Femoropopliteal and tibioperoneal artery atherosclerosis. IMPRESSION: 1. No acute osseous abnormality. 2. Mild to moderate tricompartment osteoarthritis. Electronically Signed   By: Evangeline Dakin M.D.   On: 09/26/2018 09:24   Dg C-arm 1-60 Min  Result Date: 09/27/2018 CLINICAL DATA:  Right hip fracture. EXAM: DG C-ARM 61-120 MIN; OPERATIVE RIGHT HIP WITH PELVIS COMPARISON:  For 04/2019 FINDINGS: C-arm images of the right hip demonstrate right hip replacement. Normal alignment. No complication. IMPRESSION: Satisfactory right hip replacement for fracture. Electronically Signed   By: Franchot Gallo M.D.   On: 09/27/2018 11:36   Dg Hip Operative Unilat With Pelvis Right  Result Date: 09/27/2018 CLINICAL DATA:  Right hip fracture. EXAM: DG C-ARM 61-120 MIN; OPERATIVE RIGHT HIP WITH PELVIS COMPARISON:  For 04/2019 FINDINGS: C-arm images of the right hip demonstrate right hip replacement. Normal alignment. No complication. IMPRESSION: Satisfactory right hip replacement for fracture. Electronically Signed   By: Franchot Gallo M.D.   On: 09/27/2018 11:36   Dg Hip Unilat With Pelvis 2-3 Views Right  Result Date: 09/26/2018 CLINICAL DATA:  Fall with right hip pain EXAM: DG HIP (WITH OR WITHOUT PELVIS) 2-3V RIGHT COMPARISON:  04/19/2014 CT abdomen/pelvis FINDINGS: Mildly comminuted and mildly impacted  right mid femoral neck fracture with mild 4 mm lateral displacement of the dominant distal fracture fragment. No dislocation at the right hip joint. No pelvic diastasis. No suspicious focal osseous lesions. Surgical clips throughout the deep pelvis. IMPRESSION: Mildly displaced and mildly impacted right femoral neck fracture. Electronically Signed   By: Ilona Sorrel M.D.   On: 09/26/2018 09:24    Microbiology: Recent Results (from the past 240 hour(s))  Surgical pcr screen     Status: None   Collection Time: 09/26/18  3:40 PM  Result Value Ref Range Status   MRSA, PCR NEGATIVE NEGATIVE Final   Staphylococcus aureus NEGATIVE NEGATIVE Final    Comment: (NOTE) The Xpert SA Assay (FDA approved for NASAL specimens in patients 78 years of age and older), is one component of a comprehensive surveillance program. It is not intended to diagnose infection nor to guide or monitor treatment. Performed at [facility], [place] [address], [place], [place] [address]      Labs: Basic Metabolic Panel: Recent Labs  Lab 09/27/18 0301 09/28/18 0248 09/29/18 0303 09/30/18 0154 10/01/18 0238  NA  141 140 138 140 138  K 3.8 4.0 3.7 3.6 4.2  CL 109 105 107 109 109  CO2 22 24 24 23  19*  GLUCOSE 113* 136* 119* 105* 92  BUN 19 28* 31* 23 20  CREATININE 0.93 1.37* 1.18 0.95 0.93  CALCIUM 9.1 9.1 8.6* 8.7* 8.7*  MG  --  2.3 2.0 2.2  --    Liver Function Tests: Recent Labs  Lab 09/30/18 0154  AST 64*  ALT 15  ALKPHOS 74  BILITOT 1.0  PROT 5.1*  ALBUMIN 2.4*   No results for input(s): LIPASE, AMYLASE in the last 168 hours. No results for input(s): AMMONIA in the last 168 hours. CBC: Recent Labs  Lab 09/28/18 0248 09/29/18 0303 09/29/18 1254 09/30/18 0154 10/01/18 0238 10/02/18 0052  WBC 13.9* 11.1*  --  8.4 8.6 7.6  HGB 11.5* 8.7* 8.8* 8.0* 8.8* 8.2*  HCT 35.3* 26.4* 26.4* 24.6* 27.7* 26.1*  MCV 86.7 86.3  --  85.7 85.2 86.1  PLT 180 141*  --  132* 188 206   Cardiac Enzymes: No  results for input(s): CKTOTAL, CKMB, CKMBINDEX, TROPONINI in the last 168 hours. BNP: BNP (last 3 results) No results for input(s): BNP in the last 8760 hours.  ProBNP (last 3 results) No results for input(s): PROBNP in the last 8760 hours.  CBG: No results for input(s): GLUCAP in the last 168 hours.     Signed:  [name] MD.  [facility] 10/03/2018, 11:15 AM

## 2018-10-03 NOTE — Progress Notes (Signed)
[facility], gave report

## 2018-10-03 NOTE — Progress Notes (Signed)
Physical Therapy Treatment Patient Details Name: Dominic [name] MRN: [id number] DOB: 20-Sep-1933 Today's Date: 10/03/2018    History of Present Illness Pt is an 85 y.o. male admitted 09/26/18 after falling sustaining R femoral neck fx; now s/p direct anterior THA. PMH includes afib, CAD, OSA, macular degeneration, prostate CA.    PT Comments    Patient seen for activity progression. Tolerated session well but still requires significant physical assist for all aspects of safety and mobility.  Ambulated in hall with RW and assist. Current POC remains appropriate.  Follow Up Recommendations  SNF;Supervision/Assistance - 24 hour     Equipment Recommendations  (TBD)    Recommendations for Other Services       Precautions / Restrictions Precautions Precautions: Fall Restrictions RLE Weight Bearing: Weight bearing as tolerated    Mobility  Bed Mobility Overal bed mobility: Needs Assistance Bed Mobility: Supine to Sit     Supine to sit: Mod assist     General bed mobility comments: Increased physical assist to elevate trunk and rotate hips to EOB, Max cues to have patient position his feet on the floor  Transfers Overall transfer level: Needs assistance Equipment used: Rolling walker (2 wheeled) Transfers: Sit to/from Stand Sit to Stand: Mod assist         General transfer comment: Moderate assist to power up to standing from slightly elevated bed height. Increased time and effort to perform  Ambulation/Gait Ambulation/Gait assistance: Min assist Gait Distance (Feet): 75 Feet Assistive device: Rolling walker (2 wheeled) Gait Pattern/deviations: Step-to pattern;Trunk flexed;Leaning posteriorly;Antalgic;Decreased weight shift to right;Step-through pattern Gait velocity: decreased Gait velocity interpretation: <1.31 ft/sec, indicative of household ambulator General Gait Details: Max multi modal cues for increased cadence and proper stride. Patient very segmented with gait.  Poor positioning within RW device. Educated on proper positioned and spacing with poor carry over   Marine scientist Rankin (Stroke Patients Only)       Balance     Sitting balance-Leahy Scale: Poor Sitting balance - Comments: posterior list, assist to place feet on floor   Standing balance support: Bilateral upper extremity supported;During functional activity Standing balance-Leahy Scale: Poor Standing balance comment: reliance on RW                            Cognition Arousal/Alertness: Awake/alert Behavior During Therapy: WFL for tasks assessed/performed Overall Cognitive Status: No family/caregiver present to determine baseline cognitive functioning                                        Exercises Total Joint Exercises Ankle Circles/Pumps: AROM;5 reps;Both;Seated Long Arc Quad: AROM;Both;5 reps;Seated(at EOB)    General Comments        Pertinent Vitals/Pain Pain Assessment: Faces Faces Pain Scale: Hurts even more Pain Location: R hip Pain Descriptors / Indicators: Grimacing;Sore Pain Intervention(s): Monitored during session    Home Living                      Prior Function            PT Goals (current goals can now be found in the care plan section) Acute Rehab PT Goals Patient Stated Goal: to call wife PT Goal Formulation: With patient Time For Goal Achievement:  10/12/18 Potential to Achieve Goals: Good Progress towards PT goals: Progressing toward goals    Frequency    Min 3X/week      PT Plan Current plan remains appropriate    Co-evaluation              AM-PAC PT "6 Clicks" Mobility   Outcome Measure  Help needed turning from your back to your side while in a flat bed without using bedrails?: A Lot Help needed moving from lying on your back to sitting on the side of a flat bed without using bedrails?: A Lot Help needed moving to and from a bed to a  chair (including a wheelchair)?: A Lot Help needed standing up from a chair using your arms (e.g., wheelchair or bedside chair)?: A Lot Help needed to walk in hospital room?: A Lot Help needed climbing 3-5 steps with a railing? : A Lot 6 Click Score: 12    End of Session Equipment Utilized During Treatment: Gait belt Activity Tolerance: Patient tolerated treatment well Patient left: in chair;with call bell/phone within reach;with chair alarm set Nurse Communication: Mobility status PT Visit Diagnosis: Other abnormalities of gait and mobility (R26.89);History of falling (Z91.81);Muscle weakness (generalized) (M62.81)     Time: 9977-4142 PT Time Calculation (min) (ACUTE ONLY): 21 min  Charges:  $Gait Training: 8-22 mins                     Alben Deeds, PT DPT  Board Certified Neurologic Specialist [place] Pager [phone] Office [phone]    Duncan Dull 10/03/2018, 8:33 AM

## 2018-10-05 DIAGNOSIS — I6389 Other cerebral infarction: Secondary | ICD-10-CM | POA: Diagnosis not present

## 2018-10-05 DIAGNOSIS — Z955 Presence of coronary angioplasty implant and graft: Secondary | ICD-10-CM | POA: Diagnosis not present

## 2018-10-05 DIAGNOSIS — L03115 Cellulitis of right lower limb: Secondary | ICD-10-CM | POA: Diagnosis not present

## 2018-10-05 DIAGNOSIS — S72001A Fracture of unspecified part of neck of right femur, initial encounter for closed fracture: Secondary | ICD-10-CM | POA: Diagnosis not present

## 2018-10-05 DIAGNOSIS — N178 Other acute kidney failure: Secondary | ICD-10-CM | POA: Diagnosis not present

## 2018-10-05 DIAGNOSIS — G4733 Obstructive sleep apnea (adult) (pediatric): Secondary | ICD-10-CM | POA: Diagnosis not present

## 2018-10-06 DIAGNOSIS — I10 Essential (primary) hypertension: Secondary | ICD-10-CM | POA: Diagnosis not present

## 2018-10-06 DIAGNOSIS — I480 Paroxysmal atrial fibrillation: Secondary | ICD-10-CM | POA: Diagnosis not present

## 2018-10-06 DIAGNOSIS — L03115 Cellulitis of right lower limb: Secondary | ICD-10-CM | POA: Diagnosis not present

## 2018-10-06 DIAGNOSIS — S72001A Fracture of unspecified part of neck of right femur, initial encounter for closed fracture: Secondary | ICD-10-CM | POA: Diagnosis not present

## 2018-10-06 DIAGNOSIS — I2510 Atherosclerotic heart disease of native coronary artery without angina pectoris: Secondary | ICD-10-CM | POA: Diagnosis not present

## 2018-10-06 DIAGNOSIS — D62 Acute posthemorrhagic anemia: Secondary | ICD-10-CM | POA: Diagnosis not present

## 2018-10-09 ENCOUNTER — Other Ambulatory Visit: Payer: Self-pay

## 2018-10-09 MED ORDER — METOPROLOL SUCCINATE ER 25 MG PO TB24: 75.0000 mg | ORAL_TABLET | Freq: Every day | ORAL | 1 refills | Status: DC

## 2018-10-22 DIAGNOSIS — R600 Localized edema: Secondary | ICD-10-CM | POA: Diagnosis not present

## 2018-10-22 DIAGNOSIS — I119 Hypertensive heart disease without heart failure: Secondary | ICD-10-CM | POA: Diagnosis not present

## 2018-10-22 DIAGNOSIS — I2510 Atherosclerotic heart disease of native coronary artery without angina pectoris: Secondary | ICD-10-CM | POA: Diagnosis not present

## 2018-10-23 DIAGNOSIS — S72001A Fracture of unspecified part of neck of right femur, initial encounter for closed fracture: Secondary | ICD-10-CM | POA: Diagnosis not present

## 2018-10-23 DIAGNOSIS — I2510 Atherosclerotic heart disease of native coronary artery without angina pectoris: Secondary | ICD-10-CM | POA: Diagnosis not present

## 2018-10-23 DIAGNOSIS — I10 Essential (primary) hypertension: Secondary | ICD-10-CM | POA: Diagnosis not present

## 2018-10-23 DIAGNOSIS — I480 Paroxysmal atrial fibrillation: Secondary | ICD-10-CM | POA: Diagnosis not present

## 2018-10-29 ENCOUNTER — Telehealth: Payer: Self-pay | Admitting: Orthopaedic Surgery

## 2018-10-29 NOTE — Telephone Encounter (Signed)
Wound care nurse is wanting to know if Dr, Rush Farmer wants her to take the patients staples out or does the patient need to make an appt to come in to get them taking out.

## 2018-10-29 NOTE — Telephone Encounter (Signed)
Yes 3 can take staples out and placing Steri-Strips.  His surgery I think was on April 12 so it is more than enough time since surgery to have those things come out.  Thank you.

## 2018-10-29 NOTE — Telephone Encounter (Signed)
pls advise. thanks 

## 2018-10-30 NOTE — Telephone Encounter (Signed)
IC advised per Ninfa Linden.

## 2018-11-03 DIAGNOSIS — R2689 Other abnormalities of gait and mobility: Secondary | ICD-10-CM | POA: Diagnosis not present

## 2018-11-03 DIAGNOSIS — D6489 Other specified anemias: Secondary | ICD-10-CM | POA: Diagnosis not present

## 2018-11-03 DIAGNOSIS — I480 Paroxysmal atrial fibrillation: Secondary | ICD-10-CM | POA: Diagnosis not present

## 2018-11-03 DIAGNOSIS — I6389 Other cerebral infarction: Secondary | ICD-10-CM | POA: Diagnosis not present

## 2018-11-04 ENCOUNTER — Encounter (INDEPENDENT_AMBULATORY_CARE_PROVIDER_SITE_OTHER): Payer: Medicare HMO | Admitting: Ophthalmology

## 2018-11-04 DIAGNOSIS — R600 Localized edema: Secondary | ICD-10-CM | POA: Diagnosis not present

## 2018-11-04 DIAGNOSIS — M79672 Pain in left foot: Secondary | ICD-10-CM | POA: Diagnosis not present

## 2018-11-05 DIAGNOSIS — Z79899 Other long term (current) drug therapy: Secondary | ICD-10-CM | POA: Diagnosis not present

## 2018-11-06 DIAGNOSIS — D6489 Other specified anemias: Secondary | ICD-10-CM | POA: Diagnosis not present

## 2018-11-06 DIAGNOSIS — R600 Localized edema: Secondary | ICD-10-CM | POA: Diagnosis not present

## 2018-11-06 DIAGNOSIS — G4733 Obstructive sleep apnea (adult) (pediatric): Secondary | ICD-10-CM | POA: Diagnosis not present

## 2018-11-06 DIAGNOSIS — I480 Paroxysmal atrial fibrillation: Secondary | ICD-10-CM | POA: Diagnosis not present

## 2018-11-06 DIAGNOSIS — S72001A Fracture of unspecified part of neck of right femur, initial encounter for closed fracture: Secondary | ICD-10-CM | POA: Diagnosis not present

## 2018-11-06 DIAGNOSIS — I6389 Other cerebral infarction: Secondary | ICD-10-CM | POA: Diagnosis not present

## 2018-11-06 DIAGNOSIS — R2689 Other abnormalities of gait and mobility: Secondary | ICD-10-CM | POA: Diagnosis not present

## 2018-11-07 DIAGNOSIS — I2510 Atherosclerotic heart disease of native coronary artery without angina pectoris: Secondary | ICD-10-CM | POA: Diagnosis not present

## 2018-11-07 DIAGNOSIS — M79605 Pain in left leg: Secondary | ICD-10-CM | POA: Diagnosis present

## 2018-11-07 DIAGNOSIS — R262 Difficulty in walking, not elsewhere classified: Secondary | ICD-10-CM | POA: Diagnosis not present

## 2018-11-07 DIAGNOSIS — Z9181 History of falling: Secondary | ICD-10-CM | POA: Diagnosis not present

## 2018-11-07 DIAGNOSIS — R05 Cough: Secondary | ICD-10-CM | POA: Diagnosis not present

## 2018-11-07 DIAGNOSIS — I10 Essential (primary) hypertension: Secondary | ICD-10-CM | POA: Diagnosis not present

## 2018-11-07 DIAGNOSIS — I480 Paroxysmal atrial fibrillation: Secondary | ICD-10-CM | POA: Diagnosis not present

## 2018-11-07 DIAGNOSIS — R1311 Dysphagia, oral phase: Secondary | ICD-10-CM | POA: Diagnosis not present

## 2018-11-07 DIAGNOSIS — M7989 Other specified soft tissue disorders: Secondary | ICD-10-CM | POA: Diagnosis not present

## 2018-11-07 DIAGNOSIS — H3530 Unspecified macular degeneration: Secondary | ICD-10-CM | POA: Diagnosis not present

## 2018-11-07 DIAGNOSIS — E785 Hyperlipidemia, unspecified: Secondary | ICD-10-CM | POA: Diagnosis not present

## 2018-11-07 DIAGNOSIS — S72001D Fracture of unspecified part of neck of right femur, subsequent encounter for closed fracture with routine healing: Secondary | ICD-10-CM | POA: Diagnosis not present

## 2018-11-07 DIAGNOSIS — Z7901 Long term (current) use of anticoagulants: Secondary | ICD-10-CM | POA: Diagnosis not present

## 2018-11-07 DIAGNOSIS — R531 Weakness: Secondary | ICD-10-CM | POA: Diagnosis not present

## 2018-11-07 DIAGNOSIS — R2689 Other abnormalities of gait and mobility: Secondary | ICD-10-CM | POA: Diagnosis not present

## 2018-11-07 DIAGNOSIS — L03116 Cellulitis of left lower limb: Secondary | ICD-10-CM | POA: Diagnosis not present

## 2018-11-07 DIAGNOSIS — M6281 Muscle weakness (generalized): Secondary | ICD-10-CM | POA: Diagnosis not present

## 2018-11-07 DIAGNOSIS — Z471 Aftercare following joint replacement surgery: Secondary | ICD-10-CM | POA: Diagnosis not present

## 2018-11-07 DIAGNOSIS — R41841 Cognitive communication deficit: Secondary | ICD-10-CM | POA: Diagnosis not present

## 2018-11-07 DIAGNOSIS — R609 Edema, unspecified: Secondary | ICD-10-CM | POA: Diagnosis not present

## 2018-11-07 DIAGNOSIS — S72001A Fracture of unspecified part of neck of right femur, initial encounter for closed fracture: Secondary | ICD-10-CM | POA: Diagnosis not present

## 2018-11-07 DIAGNOSIS — R2681 Unsteadiness on feet: Secondary | ICD-10-CM | POA: Diagnosis not present

## 2018-11-07 DIAGNOSIS — Z8546 Personal history of malignant neoplasm of prostate: Secondary | ICD-10-CM | POA: Diagnosis not present

## 2018-11-07 DIAGNOSIS — Z79899 Other long term (current) drug therapy: Secondary | ICD-10-CM | POA: Diagnosis not present

## 2018-11-07 DIAGNOSIS — J9811 Atelectasis: Secondary | ICD-10-CM | POA: Diagnosis not present

## 2018-11-07 DIAGNOSIS — Z96641 Presence of right artificial hip joint: Secondary | ICD-10-CM | POA: Diagnosis not present

## 2018-11-07 DIAGNOSIS — G4733 Obstructive sleep apnea (adult) (pediatric): Secondary | ICD-10-CM | POA: Diagnosis not present

## 2018-11-07 DIAGNOSIS — R278 Other lack of coordination: Secondary | ICD-10-CM | POA: Diagnosis not present

## 2018-11-08 ENCOUNTER — Other Ambulatory Visit: Payer: Self-pay

## 2018-11-08 ENCOUNTER — Encounter (HOSPITAL_BASED_OUTPATIENT_CLINIC_OR_DEPARTMENT_OTHER): Payer: Self-pay | Admitting: Emergency Medicine

## 2018-11-08 ENCOUNTER — Emergency Department (HOSPITAL_BASED_OUTPATIENT_CLINIC_OR_DEPARTMENT_OTHER)
Admission: EM | Admit: 2018-11-08 | Discharge: 2018-11-08 | Disposition: A | Discharge status: Home / Self Care | Payer: Medicare HMO | Attending: Emergency Medicine | Admitting: Emergency Medicine

## 2018-11-08 ENCOUNTER — Emergency Department (HOSPITAL_BASED_OUTPATIENT_CLINIC_OR_DEPARTMENT_OTHER): Payer: Medicare HMO

## 2018-11-08 DIAGNOSIS — Z79899 Other long term (current) drug therapy: Secondary | ICD-10-CM | POA: Insufficient documentation

## 2018-11-08 DIAGNOSIS — L03116 Cellulitis of left lower limb: Secondary | ICD-10-CM | POA: Diagnosis not present

## 2018-11-08 DIAGNOSIS — I10 Essential (primary) hypertension: Secondary | ICD-10-CM | POA: Diagnosis not present

## 2018-11-08 DIAGNOSIS — S72001D Fracture of unspecified part of neck of right femur, subsequent encounter for closed fracture with routine healing: Secondary | ICD-10-CM | POA: Diagnosis not present

## 2018-11-08 DIAGNOSIS — I2510 Atherosclerotic heart disease of native coronary artery without angina pectoris: Secondary | ICD-10-CM | POA: Diagnosis not present

## 2018-11-08 DIAGNOSIS — R1311 Dysphagia, oral phase: Secondary | ICD-10-CM | POA: Diagnosis not present

## 2018-11-08 DIAGNOSIS — Z96641 Presence of right artificial hip joint: Secondary | ICD-10-CM | POA: Insufficient documentation

## 2018-11-08 DIAGNOSIS — Z8546 Personal history of malignant neoplasm of prostate: Secondary | ICD-10-CM | POA: Diagnosis not present

## 2018-11-08 DIAGNOSIS — Z7901 Long term (current) use of anticoagulants: Secondary | ICD-10-CM | POA: Insufficient documentation

## 2018-11-08 DIAGNOSIS — G4733 Obstructive sleep apnea (adult) (pediatric): Secondary | ICD-10-CM | POA: Diagnosis not present

## 2018-11-08 DIAGNOSIS — M7989 Other specified soft tissue disorders: Secondary | ICD-10-CM | POA: Diagnosis not present

## 2018-11-08 DIAGNOSIS — H3530 Unspecified macular degeneration: Secondary | ICD-10-CM | POA: Diagnosis not present

## 2018-11-08 DIAGNOSIS — E785 Hyperlipidemia, unspecified: Secondary | ICD-10-CM | POA: Diagnosis not present

## 2018-11-08 DIAGNOSIS — I480 Paroxysmal atrial fibrillation: Secondary | ICD-10-CM | POA: Diagnosis not present

## 2018-11-08 LAB — CBC WITH DIFFERENTIAL/PLATELET
Abs Immature Granulocytes: 0.01 10*3/uL (ref 0.00–0.07)
Basophils Absolute: 0 10*3/uL (ref 0.0–0.1)
Basophils Relative: 1 %
Eosinophils Absolute: 0.2 10*3/uL (ref 0.0–0.5)
Eosinophils Relative: 4 %
HCT: 34.3 % — ABNORMAL LOW (ref 39.0–52.0)
Hemoglobin: 10.4 g/dL — ABNORMAL LOW (ref 13.0–17.0)
Immature Granulocytes: 0 %
Lymphocytes Relative: 15 %
Lymphs Abs: 0.9 10*3/uL (ref 0.7–4.0)
MCH: 26.6 pg (ref 26.0–34.0)
MCHC: 30.3 g/dL (ref 30.0–36.0)
MCV: 87.7 fL (ref 80.0–100.0)
Monocytes Absolute: 0.6 10*3/uL (ref 0.1–1.0)
Monocytes Relative: 11 %
Neutro Abs: 3.9 10*3/uL (ref 1.7–7.7)
Neutrophils Relative %: 69 %
Platelets: 221 10*3/uL (ref 150–400)
RBC: 3.91 MIL/uL — ABNORMAL LOW (ref 4.22–5.81)
RDW: 15.6 % — ABNORMAL HIGH (ref 11.5–15.5)
WBC: 5.6 10*3/uL (ref 4.0–10.5)
nRBC: 0 % (ref 0.0–0.2)

## 2018-11-08 LAB — BASIC METABOLIC PANEL
Anion gap: 6 (ref 5–15)
BUN: 14 mg/dL (ref 8–23)
CO2: 25 mmol/L (ref 22–32)
Calcium: 9 mg/dL (ref 8.9–10.3)
Chloride: 107 mmol/L (ref 98–111)
Creatinine, Ser: 1.03 mg/dL (ref 0.61–1.24)
GFR calc Af Amer: >60 mL/min (ref >60)
GFR calc non Af Amer: >60 mL/min (ref >60)
Glucose, Bld: 139 mg/dL — ABNORMAL HIGH (ref 70–99)
Potassium: 3.2 mmol/L — ABNORMAL LOW (ref 3.5–5.1)
Sodium: 138 mmol/L (ref 135–145)

## 2018-11-08 MED ORDER — DOXYCYCLINE HYCLATE 100 MG PO CAPS: 100.0000 mg | ORAL_CAPSULE | Freq: Two times a day (BID) | ORAL | 0 refills | Status: DC

## 2018-11-08 MED ORDER — POTASSIUM CHLORIDE CRYS ER 20 MEQ PO TBCR
40.0000 meq | EXTENDED_RELEASE_TABLET | Freq: Once | ORAL | Status: AC
  Administered 2018-11-08: 40 meq via ORAL
  Filled 2018-11-08: qty 2

## 2018-11-08 NOTE — ED Notes (Signed)
ED Provider at bedside. 

## 2018-11-08 NOTE — ED Notes (Signed)
Bacitracin applied

## 2018-11-08 NOTE — ED Triage Notes (Signed)
Pt was d/c yesterday from skilled nursing facility following rehab for a hip fx. Home health noticed today pt had redness and swelling to both legs and was concerned for cellulitis.

## 2018-11-08 NOTE — Discharge Instructions (Signed)
Take the antibiotics as directed.  Try to keep your legs elevated as much as possible and keep pressure off of the heels.  Follow-up with your primary care provider.  Change the dressing on the toe at least once a day.  Return here if there is worsening symptoms, worsening redness or fevers.
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
## 2018-11-08 NOTE — ED Notes (Signed)
Wound care  to 2nd and third left toe done by Orlando Penner, RN.

## 2018-11-11 ENCOUNTER — Other Ambulatory Visit: Payer: Self-pay

## 2018-11-11 ENCOUNTER — Ambulatory Visit (INDEPENDENT_AMBULATORY_CARE_PROVIDER_SITE_OTHER): Payer: Medicare HMO | Admitting: Orthopaedic Surgery

## 2018-11-11 ENCOUNTER — Encounter: Payer: Self-pay | Admitting: Orthopaedic Surgery

## 2018-11-11 DIAGNOSIS — L03116 Cellulitis of left lower limb: Secondary | ICD-10-CM

## 2018-11-11 DIAGNOSIS — Z96641 Presence of right artificial hip joint: Secondary | ICD-10-CM

## 2018-11-11 MED ORDER — DOXYCYCLINE HYCLATE 100 MG PO CAPS: 100.0000 mg | ORAL_CAPSULE | Freq: Two times a day (BID) | ORAL | 0 refills | Status: AC

## 2018-11-11 MED ORDER — DOXYCYCLINE HYCLATE 100 MG PO CAPS: 100.0000 mg | ORAL_CAPSULE | Freq: Two times a day (BID) | ORAL | 0 refills | Status: DC
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
## 2018-11-12 DIAGNOSIS — Z96641 Presence of right artificial hip joint: Secondary | ICD-10-CM | POA: Diagnosis not present

## 2018-11-12 DIAGNOSIS — H3530 Unspecified macular degeneration: Secondary | ICD-10-CM | POA: Diagnosis not present

## 2018-11-12 DIAGNOSIS — R531 Weakness: Secondary | ICD-10-CM | POA: Diagnosis not present

## 2018-11-12 DIAGNOSIS — I10 Essential (primary) hypertension: Secondary | ICD-10-CM | POA: Diagnosis not present

## 2018-11-12 DIAGNOSIS — R1311 Dysphagia, oral phase: Secondary | ICD-10-CM | POA: Diagnosis not present

## 2018-11-12 DIAGNOSIS — J9811 Atelectasis: Secondary | ICD-10-CM | POA: Diagnosis not present

## 2018-11-12 DIAGNOSIS — R05 Cough: Secondary | ICD-10-CM | POA: Diagnosis not present

## 2018-11-12 DIAGNOSIS — R609 Edema, unspecified: Secondary | ICD-10-CM | POA: Diagnosis not present

## 2018-11-12 DIAGNOSIS — I480 Paroxysmal atrial fibrillation: Secondary | ICD-10-CM | POA: Diagnosis not present

## 2018-11-12 DIAGNOSIS — G4733 Obstructive sleep apnea (adult) (pediatric): Secondary | ICD-10-CM | POA: Diagnosis not present

## 2018-11-12 DIAGNOSIS — E785 Hyperlipidemia, unspecified: Secondary | ICD-10-CM | POA: Diagnosis not present

## 2018-11-12 DIAGNOSIS — Z7901 Long term (current) use of anticoagulants: Secondary | ICD-10-CM | POA: Diagnosis not present

## 2018-11-12 DIAGNOSIS — S72001D Fracture of unspecified part of neck of right femur, subsequent encounter for closed fracture with routine healing: Secondary | ICD-10-CM | POA: Diagnosis not present

## 2018-11-12 DIAGNOSIS — I2510 Atherosclerotic heart disease of native coronary artery without angina pectoris: Secondary | ICD-10-CM | POA: Diagnosis not present

## 2018-11-13 DIAGNOSIS — I480 Paroxysmal atrial fibrillation: Secondary | ICD-10-CM | POA: Diagnosis not present

## 2018-11-13 DIAGNOSIS — I2510 Atherosclerotic heart disease of native coronary artery without angina pectoris: Secondary | ICD-10-CM | POA: Diagnosis not present

## 2018-11-13 DIAGNOSIS — H3530 Unspecified macular degeneration: Secondary | ICD-10-CM | POA: Diagnosis not present

## 2018-11-13 DIAGNOSIS — G4733 Obstructive sleep apnea (adult) (pediatric): Secondary | ICD-10-CM | POA: Diagnosis not present

## 2018-11-13 DIAGNOSIS — Z7901 Long term (current) use of anticoagulants: Secondary | ICD-10-CM | POA: Diagnosis not present

## 2018-11-13 DIAGNOSIS — Z96641 Presence of right artificial hip joint: Secondary | ICD-10-CM | POA: Diagnosis not present

## 2018-11-13 DIAGNOSIS — I10 Essential (primary) hypertension: Secondary | ICD-10-CM | POA: Diagnosis not present

## 2018-11-13 DIAGNOSIS — E785 Hyperlipidemia, unspecified: Secondary | ICD-10-CM | POA: Diagnosis not present

## 2018-11-13 DIAGNOSIS — S72001D Fracture of unspecified part of neck of right femur, subsequent encounter for closed fracture with routine healing: Secondary | ICD-10-CM | POA: Diagnosis not present

## 2018-11-13 DIAGNOSIS — R1311 Dysphagia, oral phase: Secondary | ICD-10-CM | POA: Diagnosis not present

## 2018-11-14 ENCOUNTER — Telehealth: Payer: Self-pay

## 2018-11-14 DIAGNOSIS — Z20822 Contact with and (suspected) exposure to covid-19: Secondary | ICD-10-CM

## 2018-11-14 NOTE — Telephone Encounter (Signed)
Pt and wife both was at [employer]. Explained to patient re: potential for covid exposure via staff member. Pt and wife both to be tested on 11/15/18 at [facility] at [place]. Order placed.

## 2018-11-15 ENCOUNTER — Other Ambulatory Visit: Payer: Medicare HMO

## 2018-11-16 LAB — NOVEL CORONAVIRUS, NAA: SARS-CoV-2, NAA: NOT DETECTED

## 2018-11-17 DIAGNOSIS — I2510 Atherosclerotic heart disease of native coronary artery without angina pectoris: Secondary | ICD-10-CM | POA: Diagnosis not present

## 2018-11-17 DIAGNOSIS — Z7901 Long term (current) use of anticoagulants: Secondary | ICD-10-CM | POA: Diagnosis not present

## 2018-11-17 DIAGNOSIS — I480 Paroxysmal atrial fibrillation: Secondary | ICD-10-CM | POA: Diagnosis not present

## 2018-11-17 DIAGNOSIS — I10 Essential (primary) hypertension: Secondary | ICD-10-CM | POA: Diagnosis not present

## 2018-11-17 DIAGNOSIS — E785 Hyperlipidemia, unspecified: Secondary | ICD-10-CM | POA: Diagnosis not present

## 2018-11-17 DIAGNOSIS — G4733 Obstructive sleep apnea (adult) (pediatric): Secondary | ICD-10-CM | POA: Diagnosis not present

## 2018-11-17 DIAGNOSIS — R1311 Dysphagia, oral phase: Secondary | ICD-10-CM | POA: Diagnosis not present

## 2018-11-17 DIAGNOSIS — H3530 Unspecified macular degeneration: Secondary | ICD-10-CM | POA: Diagnosis not present

## 2018-11-17 DIAGNOSIS — S72001D Fracture of unspecified part of neck of right femur, subsequent encounter for closed fracture with routine healing: Secondary | ICD-10-CM | POA: Diagnosis not present

## 2018-11-17 DIAGNOSIS — Z96641 Presence of right artificial hip joint: Secondary | ICD-10-CM | POA: Diagnosis not present

## 2018-11-18 DIAGNOSIS — I10 Essential (primary) hypertension: Secondary | ICD-10-CM | POA: Diagnosis not present

## 2018-11-18 DIAGNOSIS — E785 Hyperlipidemia, unspecified: Secondary | ICD-10-CM | POA: Diagnosis not present

## 2018-11-18 DIAGNOSIS — Z7901 Long term (current) use of anticoagulants: Secondary | ICD-10-CM | POA: Diagnosis not present

## 2018-11-18 DIAGNOSIS — G4733 Obstructive sleep apnea (adult) (pediatric): Secondary | ICD-10-CM | POA: Diagnosis not present

## 2018-11-18 DIAGNOSIS — H3530 Unspecified macular degeneration: Secondary | ICD-10-CM | POA: Diagnosis not present

## 2018-11-18 DIAGNOSIS — R1311 Dysphagia, oral phase: Secondary | ICD-10-CM | POA: Diagnosis not present

## 2018-11-18 DIAGNOSIS — Z96641 Presence of right artificial hip joint: Secondary | ICD-10-CM | POA: Diagnosis not present

## 2018-11-18 DIAGNOSIS — I2510 Atherosclerotic heart disease of native coronary artery without angina pectoris: Secondary | ICD-10-CM | POA: Diagnosis not present

## 2018-11-18 DIAGNOSIS — I480 Paroxysmal atrial fibrillation: Secondary | ICD-10-CM | POA: Diagnosis not present

## 2018-11-18 DIAGNOSIS — S72001D Fracture of unspecified part of neck of right femur, subsequent encounter for closed fracture with routine healing: Secondary | ICD-10-CM | POA: Diagnosis not present

## 2018-11-19 DIAGNOSIS — G4733 Obstructive sleep apnea (adult) (pediatric): Secondary | ICD-10-CM | POA: Diagnosis not present

## 2018-11-19 DIAGNOSIS — Z96641 Presence of right artificial hip joint: Secondary | ICD-10-CM | POA: Diagnosis not present

## 2018-11-19 DIAGNOSIS — Z7901 Long term (current) use of anticoagulants: Secondary | ICD-10-CM | POA: Diagnosis not present

## 2018-11-19 DIAGNOSIS — S72001D Fracture of unspecified part of neck of right femur, subsequent encounter for closed fracture with routine healing: Secondary | ICD-10-CM | POA: Diagnosis not present

## 2018-11-19 DIAGNOSIS — E785 Hyperlipidemia, unspecified: Secondary | ICD-10-CM | POA: Diagnosis not present

## 2018-11-19 DIAGNOSIS — I10 Essential (primary) hypertension: Secondary | ICD-10-CM | POA: Diagnosis not present

## 2018-11-19 DIAGNOSIS — I480 Paroxysmal atrial fibrillation: Secondary | ICD-10-CM | POA: Diagnosis not present

## 2018-11-19 DIAGNOSIS — R1311 Dysphagia, oral phase: Secondary | ICD-10-CM | POA: Diagnosis not present

## 2018-11-19 DIAGNOSIS — I2510 Atherosclerotic heart disease of native coronary artery without angina pectoris: Secondary | ICD-10-CM | POA: Diagnosis not present

## 2018-11-19 DIAGNOSIS — H3530 Unspecified macular degeneration: Secondary | ICD-10-CM | POA: Diagnosis not present

## 2018-11-20 ENCOUNTER — Other Ambulatory Visit: Payer: Self-pay | Admitting: Cardiology

## 2018-11-20 DIAGNOSIS — I10 Essential (primary) hypertension: Secondary | ICD-10-CM | POA: Diagnosis not present

## 2018-11-20 DIAGNOSIS — E785 Hyperlipidemia, unspecified: Secondary | ICD-10-CM | POA: Diagnosis not present

## 2018-11-20 DIAGNOSIS — Z7901 Long term (current) use of anticoagulants: Secondary | ICD-10-CM | POA: Diagnosis not present

## 2018-11-20 DIAGNOSIS — G4733 Obstructive sleep apnea (adult) (pediatric): Secondary | ICD-10-CM | POA: Diagnosis not present

## 2018-11-20 DIAGNOSIS — S72001D Fracture of unspecified part of neck of right femur, subsequent encounter for closed fracture with routine healing: Secondary | ICD-10-CM | POA: Diagnosis not present

## 2018-11-20 DIAGNOSIS — Z96641 Presence of right artificial hip joint: Secondary | ICD-10-CM | POA: Diagnosis not present

## 2018-11-20 DIAGNOSIS — R1311 Dysphagia, oral phase: Secondary | ICD-10-CM | POA: Diagnosis not present

## 2018-11-20 DIAGNOSIS — H3530 Unspecified macular degeneration: Secondary | ICD-10-CM | POA: Diagnosis not present

## 2018-11-20 DIAGNOSIS — I2510 Atherosclerotic heart disease of native coronary artery without angina pectoris: Secondary | ICD-10-CM | POA: Diagnosis not present

## 2018-11-20 DIAGNOSIS — I480 Paroxysmal atrial fibrillation: Secondary | ICD-10-CM | POA: Diagnosis not present

## 2018-11-23 DIAGNOSIS — Z7901 Long term (current) use of anticoagulants: Secondary | ICD-10-CM | POA: Diagnosis not present

## 2018-11-23 DIAGNOSIS — S72001D Fracture of unspecified part of neck of right femur, subsequent encounter for closed fracture with routine healing: Secondary | ICD-10-CM | POA: Diagnosis not present

## 2018-11-23 DIAGNOSIS — E785 Hyperlipidemia, unspecified: Secondary | ICD-10-CM | POA: Diagnosis not present

## 2018-11-23 DIAGNOSIS — H3530 Unspecified macular degeneration: Secondary | ICD-10-CM | POA: Diagnosis not present

## 2018-11-23 DIAGNOSIS — I480 Paroxysmal atrial fibrillation: Secondary | ICD-10-CM | POA: Diagnosis not present

## 2018-11-23 DIAGNOSIS — Z96641 Presence of right artificial hip joint: Secondary | ICD-10-CM | POA: Diagnosis not present

## 2018-11-23 DIAGNOSIS — I2510 Atherosclerotic heart disease of native coronary artery without angina pectoris: Secondary | ICD-10-CM | POA: Diagnosis not present

## 2018-11-23 DIAGNOSIS — I10 Essential (primary) hypertension: Secondary | ICD-10-CM | POA: Diagnosis not present

## 2018-11-23 DIAGNOSIS — R1311 Dysphagia, oral phase: Secondary | ICD-10-CM | POA: Diagnosis not present

## 2018-11-23 DIAGNOSIS — G4733 Obstructive sleep apnea (adult) (pediatric): Secondary | ICD-10-CM | POA: Diagnosis not present

## 2018-11-24 DIAGNOSIS — I2510 Atherosclerotic heart disease of native coronary artery without angina pectoris: Secondary | ICD-10-CM | POA: Diagnosis not present

## 2018-11-24 DIAGNOSIS — S72001D Fracture of unspecified part of neck of right femur, subsequent encounter for closed fracture with routine healing: Secondary | ICD-10-CM | POA: Diagnosis not present

## 2018-11-24 DIAGNOSIS — I10 Essential (primary) hypertension: Secondary | ICD-10-CM | POA: Diagnosis not present

## 2018-11-24 DIAGNOSIS — Z96641 Presence of right artificial hip joint: Secondary | ICD-10-CM | POA: Diagnosis not present

## 2018-11-24 DIAGNOSIS — I480 Paroxysmal atrial fibrillation: Secondary | ICD-10-CM | POA: Diagnosis not present

## 2018-11-24 DIAGNOSIS — G4733 Obstructive sleep apnea (adult) (pediatric): Secondary | ICD-10-CM | POA: Diagnosis not present

## 2018-11-24 DIAGNOSIS — E785 Hyperlipidemia, unspecified: Secondary | ICD-10-CM | POA: Diagnosis not present

## 2018-11-24 DIAGNOSIS — H3530 Unspecified macular degeneration: Secondary | ICD-10-CM | POA: Diagnosis not present

## 2018-11-24 DIAGNOSIS — Z7901 Long term (current) use of anticoagulants: Secondary | ICD-10-CM | POA: Diagnosis not present

## 2018-11-24 DIAGNOSIS — R1311 Dysphagia, oral phase: Secondary | ICD-10-CM | POA: Diagnosis not present

## 2018-11-25 ENCOUNTER — Other Ambulatory Visit: Payer: Self-pay

## 2018-11-25 ENCOUNTER — Encounter: Payer: Self-pay | Admitting: Orthopaedic Surgery

## 2018-11-25 ENCOUNTER — Ambulatory Visit (INDEPENDENT_AMBULATORY_CARE_PROVIDER_SITE_OTHER): Payer: Medicare HMO | Admitting: Orthopaedic Surgery

## 2018-11-25 DIAGNOSIS — Z96641 Presence of right artificial hip joint: Secondary | ICD-10-CM

## 2018-11-25 DIAGNOSIS — E785 Hyperlipidemia, unspecified: Secondary | ICD-10-CM | POA: Diagnosis not present

## 2018-11-25 DIAGNOSIS — I2510 Atherosclerotic heart disease of native coronary artery without angina pectoris: Secondary | ICD-10-CM | POA: Diagnosis not present

## 2018-11-25 DIAGNOSIS — I10 Essential (primary) hypertension: Secondary | ICD-10-CM | POA: Diagnosis not present

## 2018-11-25 DIAGNOSIS — I480 Paroxysmal atrial fibrillation: Secondary | ICD-10-CM | POA: Diagnosis not present

## 2018-11-25 DIAGNOSIS — Z7901 Long term (current) use of anticoagulants: Secondary | ICD-10-CM | POA: Diagnosis not present

## 2018-11-25 DIAGNOSIS — L03116 Cellulitis of left lower limb: Secondary | ICD-10-CM

## 2018-11-25 DIAGNOSIS — H3530 Unspecified macular degeneration: Secondary | ICD-10-CM | POA: Diagnosis not present

## 2018-11-25 DIAGNOSIS — G4733 Obstructive sleep apnea (adult) (pediatric): Secondary | ICD-10-CM | POA: Diagnosis not present

## 2018-11-25 DIAGNOSIS — R1311 Dysphagia, oral phase: Secondary | ICD-10-CM | POA: Diagnosis not present

## 2018-11-25 DIAGNOSIS — S72001D Fracture of unspecified part of neck of right femur, subsequent encounter for closed fracture with routine healing: Secondary | ICD-10-CM | POA: Diagnosis not present

## 2018-11-25 NOTE — Progress Notes (Signed)
The patient comes in today for continued follow-up status post a right total hip arthroplasty that was due to to a mechanical fall and a broken hip.  His last visit I will start him on doxycycline due to cellulitis in his opposite side and his left leg with some wounds on his toes.  He has been wearing compressive sock on that left side.  He reports that he is doing better overall.  On examination he has minimal evidence of cellulitis now on the left leg.  He has a small heel wound that is closed from pressure.  His toes look good overall and his foot looks perfused.  There is no draining wounds.  We will continue the compression sock and elevation and trying not to have as much pressure on his heel when he is in bed with placing pillows under his leg.  He will still work on elevation of that left leg as well.  We will see him back in 4 weeks to see how he is doing overall.  Still no x-rays will be needed in terms of the right hip replacement.  I have given notes for the therapist to continue his therapy for several more weeks.

## 2018-11-26 DIAGNOSIS — S72001D Fracture of unspecified part of neck of right femur, subsequent encounter for closed fracture with routine healing: Secondary | ICD-10-CM | POA: Diagnosis not present

## 2018-11-26 DIAGNOSIS — I10 Essential (primary) hypertension: Secondary | ICD-10-CM | POA: Diagnosis not present

## 2018-11-26 DIAGNOSIS — E785 Hyperlipidemia, unspecified: Secondary | ICD-10-CM | POA: Diagnosis not present

## 2018-11-26 DIAGNOSIS — G4733 Obstructive sleep apnea (adult) (pediatric): Secondary | ICD-10-CM | POA: Diagnosis not present

## 2018-11-26 DIAGNOSIS — H3530 Unspecified macular degeneration: Secondary | ICD-10-CM | POA: Diagnosis not present

## 2018-11-26 DIAGNOSIS — I480 Paroxysmal atrial fibrillation: Secondary | ICD-10-CM | POA: Diagnosis not present

## 2018-11-26 DIAGNOSIS — I2510 Atherosclerotic heart disease of native coronary artery without angina pectoris: Secondary | ICD-10-CM | POA: Diagnosis not present

## 2018-11-26 DIAGNOSIS — Z7901 Long term (current) use of anticoagulants: Secondary | ICD-10-CM | POA: Diagnosis not present

## 2018-11-26 DIAGNOSIS — Z96641 Presence of right artificial hip joint: Secondary | ICD-10-CM | POA: Diagnosis not present

## 2018-11-26 DIAGNOSIS — R1311 Dysphagia, oral phase: Secondary | ICD-10-CM | POA: Diagnosis not present

## 2018-11-30 ENCOUNTER — Telehealth: Payer: Self-pay | Admitting: Neurology

## 2018-11-30 DIAGNOSIS — H3530 Unspecified macular degeneration: Secondary | ICD-10-CM | POA: Diagnosis not present

## 2018-11-30 DIAGNOSIS — E785 Hyperlipidemia, unspecified: Secondary | ICD-10-CM | POA: Diagnosis not present

## 2018-11-30 DIAGNOSIS — G4733 Obstructive sleep apnea (adult) (pediatric): Secondary | ICD-10-CM | POA: Diagnosis not present

## 2018-11-30 DIAGNOSIS — I10 Essential (primary) hypertension: Secondary | ICD-10-CM | POA: Diagnosis not present

## 2018-11-30 DIAGNOSIS — S72001D Fracture of unspecified part of neck of right femur, subsequent encounter for closed fracture with routine healing: Secondary | ICD-10-CM | POA: Diagnosis not present

## 2018-11-30 DIAGNOSIS — Z96641 Presence of right artificial hip joint: Secondary | ICD-10-CM | POA: Diagnosis not present

## 2018-11-30 DIAGNOSIS — I2510 Atherosclerotic heart disease of native coronary artery without angina pectoris: Secondary | ICD-10-CM | POA: Diagnosis not present

## 2018-11-30 DIAGNOSIS — Z7901 Long term (current) use of anticoagulants: Secondary | ICD-10-CM | POA: Diagnosis not present

## 2018-11-30 DIAGNOSIS — R1311 Dysphagia, oral phase: Secondary | ICD-10-CM | POA: Diagnosis not present

## 2018-11-30 DIAGNOSIS — I480 Paroxysmal atrial fibrillation: Secondary | ICD-10-CM | POA: Diagnosis not present

## 2018-11-30 NOTE — Telephone Encounter (Signed)
I don't know any neurologists in [place], so sorry. And I sorry about his decline, I am glad they are going to repeat testing. Thank you

## 2018-11-30 NOTE — Telephone Encounter (Signed)
Pt wife is calling in stating pt has taken another fall , and she wants to speak with Dr Jaynee Eagles to discuss

## 2018-11-30 NOTE — Telephone Encounter (Signed)
I spoke with Izora Gala. She was referring to the April 2020 fall where pt broke his hip. She wanted to follow up. She feels like the patient has had more of a decline in his cognitive abilities and they are ready to proceed with neuropsych testing. Pt was never able to start PT or do the driving test. She stated the patient has decided not to drive so they will not be proceeding with the driving test at this time. Pt is using a walker. He is doing PT twice weekly and making improvements. She also stated they will be moving to [place] state to live near family. She asked if Dr. Jaynee Eagles knows of anyone in [place]. I advised I would send the message to Dr. Jaynee Eagles. She is also aware that Dr. Ferne Coe office had reached out to pt on 07/29/2018 but had not heard back. She was given their number to call and schedule evaluation. She does not require a call back to from Dr. Jaynee Eagles.

## 2018-12-01 DIAGNOSIS — I2510 Atherosclerotic heart disease of native coronary artery without angina pectoris: Secondary | ICD-10-CM | POA: Diagnosis not present

## 2018-12-01 DIAGNOSIS — R1311 Dysphagia, oral phase: Secondary | ICD-10-CM | POA: Diagnosis not present

## 2018-12-01 DIAGNOSIS — E785 Hyperlipidemia, unspecified: Secondary | ICD-10-CM | POA: Diagnosis not present

## 2018-12-01 DIAGNOSIS — I480 Paroxysmal atrial fibrillation: Secondary | ICD-10-CM | POA: Diagnosis not present

## 2018-12-01 DIAGNOSIS — I10 Essential (primary) hypertension: Secondary | ICD-10-CM | POA: Diagnosis not present

## 2018-12-01 DIAGNOSIS — H3530 Unspecified macular degeneration: Secondary | ICD-10-CM | POA: Diagnosis not present

## 2018-12-01 DIAGNOSIS — S72001D Fracture of unspecified part of neck of right femur, subsequent encounter for closed fracture with routine healing: Secondary | ICD-10-CM | POA: Diagnosis not present

## 2018-12-01 DIAGNOSIS — Z7901 Long term (current) use of anticoagulants: Secondary | ICD-10-CM | POA: Diagnosis not present

## 2018-12-01 DIAGNOSIS — Z96641 Presence of right artificial hip joint: Secondary | ICD-10-CM | POA: Diagnosis not present

## 2018-12-01 DIAGNOSIS — G4733 Obstructive sleep apnea (adult) (pediatric): Secondary | ICD-10-CM | POA: Diagnosis not present

## 2018-12-01 NOTE — Telephone Encounter (Signed)
I called the wife back and the patient answered the phone. I gave him Dr. Cathren Laine message. He stated he would let his wife know.

## 2018-12-01 NOTE — Telephone Encounter (Signed)
Spoke with Dominic [name]. Referrals sent to [facility] or Dr. Sima Matas. Since pt's wife unable to drive to [facility], will have to wait for Dr. Sima Matas.

## 2018-12-01 NOTE — Telephone Encounter (Signed)
I spoke with the pt's wife and discussed Dr. Cathren Laine message. She stated the pt cannot get in to see Dr. Sima Matas until September. She declined [facility] neuropsych but stated they could do [place] or [facility] (if [facility] unavailable). I advised a message would be sent to [facility]. She verbalized appreciation.

## 2018-12-01 NOTE — Telephone Encounter (Signed)
Thank you so much

## 2018-12-01 NOTE — Telephone Encounter (Signed)
Called and spoke to Dr. Rica Mote and she is just reopening and she states can she patient soon asked me to fax records and she will contact the patient's wife  to schedule . Dr. Rica Mote telephone [phone] - fax [phone] .  I have talked to patient's wife as well.

## 2018-12-01 NOTE — Telephone Encounter (Signed)
Dominic [name] looking into another option for neuropsych testing.

## 2018-12-02 DIAGNOSIS — R1311 Dysphagia, oral phase: Secondary | ICD-10-CM | POA: Diagnosis not present

## 2018-12-02 DIAGNOSIS — E785 Hyperlipidemia, unspecified: Secondary | ICD-10-CM | POA: Diagnosis not present

## 2018-12-02 DIAGNOSIS — I480 Paroxysmal atrial fibrillation: Secondary | ICD-10-CM | POA: Diagnosis not present

## 2018-12-02 DIAGNOSIS — G4733 Obstructive sleep apnea (adult) (pediatric): Secondary | ICD-10-CM | POA: Diagnosis not present

## 2018-12-02 DIAGNOSIS — Z7901 Long term (current) use of anticoagulants: Secondary | ICD-10-CM | POA: Diagnosis not present

## 2018-12-02 DIAGNOSIS — S72001D Fracture of unspecified part of neck of right femur, subsequent encounter for closed fracture with routine healing: Secondary | ICD-10-CM | POA: Diagnosis not present

## 2018-12-02 DIAGNOSIS — H3530 Unspecified macular degeneration: Secondary | ICD-10-CM | POA: Diagnosis not present

## 2018-12-02 DIAGNOSIS — Z96641 Presence of right artificial hip joint: Secondary | ICD-10-CM | POA: Diagnosis not present

## 2018-12-02 DIAGNOSIS — I2510 Atherosclerotic heart disease of native coronary artery without angina pectoris: Secondary | ICD-10-CM | POA: Diagnosis not present

## 2018-12-02 DIAGNOSIS — I10 Essential (primary) hypertension: Secondary | ICD-10-CM | POA: Diagnosis not present

## 2018-12-03 DIAGNOSIS — I10 Essential (primary) hypertension: Secondary | ICD-10-CM | POA: Diagnosis not present

## 2018-12-03 DIAGNOSIS — S72001D Fracture of unspecified part of neck of right femur, subsequent encounter for closed fracture with routine healing: Secondary | ICD-10-CM | POA: Diagnosis not present

## 2018-12-03 DIAGNOSIS — E785 Hyperlipidemia, unspecified: Secondary | ICD-10-CM | POA: Diagnosis not present

## 2018-12-03 DIAGNOSIS — I480 Paroxysmal atrial fibrillation: Secondary | ICD-10-CM | POA: Diagnosis not present

## 2018-12-03 DIAGNOSIS — Z7901 Long term (current) use of anticoagulants: Secondary | ICD-10-CM | POA: Diagnosis not present

## 2018-12-03 DIAGNOSIS — Z96641 Presence of right artificial hip joint: Secondary | ICD-10-CM | POA: Diagnosis not present

## 2018-12-03 DIAGNOSIS — R1311 Dysphagia, oral phase: Secondary | ICD-10-CM | POA: Diagnosis not present

## 2018-12-03 DIAGNOSIS — H3530 Unspecified macular degeneration: Secondary | ICD-10-CM | POA: Diagnosis not present

## 2018-12-03 DIAGNOSIS — I2510 Atherosclerotic heart disease of native coronary artery without angina pectoris: Secondary | ICD-10-CM | POA: Diagnosis not present

## 2018-12-03 DIAGNOSIS — G4733 Obstructive sleep apnea (adult) (pediatric): Secondary | ICD-10-CM | POA: Diagnosis not present

## 2018-12-04 DIAGNOSIS — I480 Paroxysmal atrial fibrillation: Secondary | ICD-10-CM | POA: Diagnosis not present

## 2018-12-04 DIAGNOSIS — I10 Essential (primary) hypertension: Secondary | ICD-10-CM | POA: Diagnosis not present

## 2018-12-04 DIAGNOSIS — Z96641 Presence of right artificial hip joint: Secondary | ICD-10-CM | POA: Diagnosis not present

## 2018-12-04 DIAGNOSIS — S72001D Fracture of unspecified part of neck of right femur, subsequent encounter for closed fracture with routine healing: Secondary | ICD-10-CM | POA: Diagnosis not present

## 2018-12-04 DIAGNOSIS — H3530 Unspecified macular degeneration: Secondary | ICD-10-CM | POA: Diagnosis not present

## 2018-12-04 DIAGNOSIS — I2510 Atherosclerotic heart disease of native coronary artery without angina pectoris: Secondary | ICD-10-CM | POA: Diagnosis not present

## 2018-12-04 DIAGNOSIS — G4733 Obstructive sleep apnea (adult) (pediatric): Secondary | ICD-10-CM | POA: Diagnosis not present

## 2018-12-04 DIAGNOSIS — E785 Hyperlipidemia, unspecified: Secondary | ICD-10-CM | POA: Diagnosis not present

## 2018-12-04 DIAGNOSIS — R1311 Dysphagia, oral phase: Secondary | ICD-10-CM | POA: Diagnosis not present

## 2018-12-04 DIAGNOSIS — Z7901 Long term (current) use of anticoagulants: Secondary | ICD-10-CM | POA: Diagnosis not present

## 2018-12-07 DIAGNOSIS — R1311 Dysphagia, oral phase: Secondary | ICD-10-CM | POA: Diagnosis not present

## 2018-12-07 DIAGNOSIS — Z96641 Presence of right artificial hip joint: Secondary | ICD-10-CM | POA: Diagnosis not present

## 2018-12-07 DIAGNOSIS — I2510 Atherosclerotic heart disease of native coronary artery without angina pectoris: Secondary | ICD-10-CM | POA: Diagnosis not present

## 2018-12-07 DIAGNOSIS — E785 Hyperlipidemia, unspecified: Secondary | ICD-10-CM | POA: Diagnosis not present

## 2018-12-07 DIAGNOSIS — S72001D Fracture of unspecified part of neck of right femur, subsequent encounter for closed fracture with routine healing: Secondary | ICD-10-CM | POA: Diagnosis not present

## 2018-12-07 DIAGNOSIS — G4733 Obstructive sleep apnea (adult) (pediatric): Secondary | ICD-10-CM | POA: Diagnosis not present

## 2018-12-07 DIAGNOSIS — I10 Essential (primary) hypertension: Secondary | ICD-10-CM | POA: Diagnosis not present

## 2018-12-07 DIAGNOSIS — Z7901 Long term (current) use of anticoagulants: Secondary | ICD-10-CM | POA: Diagnosis not present

## 2018-12-07 DIAGNOSIS — I480 Paroxysmal atrial fibrillation: Secondary | ICD-10-CM | POA: Diagnosis not present

## 2018-12-07 DIAGNOSIS — H3530 Unspecified macular degeneration: Secondary | ICD-10-CM | POA: Diagnosis not present

## 2018-12-08 ENCOUNTER — Encounter (INDEPENDENT_AMBULATORY_CARE_PROVIDER_SITE_OTHER): Payer: Medicare HMO | Admitting: Ophthalmology

## 2018-12-08 ENCOUNTER — Other Ambulatory Visit: Payer: Self-pay

## 2018-12-08 DIAGNOSIS — H43813 Vitreous degeneration, bilateral: Secondary | ICD-10-CM

## 2018-12-08 DIAGNOSIS — H353231 Exudative age-related macular degeneration, bilateral, with active choroidal neovascularization: Secondary | ICD-10-CM | POA: Diagnosis not present

## 2018-12-08 DIAGNOSIS — Z471 Aftercare following joint replacement surgery: Secondary | ICD-10-CM | POA: Diagnosis not present

## 2018-12-08 DIAGNOSIS — H35033 Hypertensive retinopathy, bilateral: Secondary | ICD-10-CM

## 2018-12-08 DIAGNOSIS — S72001A Fracture of unspecified part of neck of right femur, initial encounter for closed fracture: Secondary | ICD-10-CM | POA: Diagnosis not present

## 2018-12-08 DIAGNOSIS — I10 Essential (primary) hypertension: Secondary | ICD-10-CM | POA: Diagnosis not present

## 2018-12-23 ENCOUNTER — Ambulatory Visit: Payer: Medicare HMO | Admitting: Orthopaedic Surgery

## 2018-12-28 ENCOUNTER — Ambulatory Visit (INDEPENDENT_AMBULATORY_CARE_PROVIDER_SITE_OTHER): Payer: Medicare HMO | Admitting: Orthopaedic Surgery

## 2018-12-28 ENCOUNTER — Encounter: Payer: Self-pay | Admitting: Orthopaedic Surgery

## 2018-12-28 ENCOUNTER — Other Ambulatory Visit: Payer: Self-pay

## 2018-12-28 DIAGNOSIS — L03116 Cellulitis of left lower limb: Secondary | ICD-10-CM

## 2018-12-28 DIAGNOSIS — Z96641 Presence of right artificial hip joint: Secondary | ICD-10-CM

## 2018-12-28 NOTE — Progress Notes (Signed)
HPI: Mr. Kolbe returns today 1 month status post right total hip arthroplasty due to mechanical fall hip fracture.  He states he has no pain in the hip.  We have seen him also for right leg cellulitis is currently on no antibiotics.  He is wearing compression stockings during the day.  He and his wife are moving to [place] state.  Review of systems: See HPI otherwise negative  Physical exam: Left hip good range of motion without pain.  Right leg slight pitting edema.  Dependent rubor distal in the tib-fib.  No skin breakdown no impending ulcers.  Right calf supple nontender.  Impression: Status post right total hip arthroplasty 09/27/2018 Right lower leg cellulitis resolved Vascular insufficiency  Plan: He will follow-up with [place] on as-needed basis.  Compression hose during the day are recommended.  Elevation above the heart both legs recommended.  Questions were encouraged and answered by Dr. Ninfa Linden and myself.

## 2018-12-30 DIAGNOSIS — R310 Gross hematuria: Secondary | ICD-10-CM | POA: Diagnosis not present

## 2018-12-30 DIAGNOSIS — N3001 Acute cystitis with hematuria: Secondary | ICD-10-CM | POA: Diagnosis not present

## 2018-12-31 ENCOUNTER — Telehealth: Payer: Self-pay | Admitting: Cardiology

## 2018-12-31 ENCOUNTER — Other Ambulatory Visit: Payer: Self-pay | Admitting: Cardiology

## 2018-12-31 MED ORDER — CLOPIDOGREL BISULFATE 75 MG PO TABS: 75.0000 mg | ORAL_TABLET | Freq: Every day | ORAL | 1 refills | Status: DC

## 2018-12-31 MED ORDER — AMLODIPINE BESYLATE 5 MG PO TABS: 5.0000 mg | ORAL_TABLET | Freq: Every day | ORAL | 1 refills | Status: DC

## 2018-12-31 NOTE — Telephone Encounter (Signed)
Returned call to patient left message on personal voice mail I will send refills to pharmacy.Advised to keep virtual appointment with Dr.Jordan 7/20 as planned.

## 2018-12-31 NOTE — Telephone Encounter (Signed)
Spoke to wife and informed her that patient is due for appointment with Dr. Martinique. [facility] staff aware that he needs to be scheduled.

## 2018-12-31 NOTE — Telephone Encounter (Signed)
New message   Pt c/o medication issue:  1. Name of Medication: amLODipine (NORVASC) 5 MG tablet   clopidogrel (PLAVIX) 75 MG tablet  2. How are you currently taking this medication (dosage and times per day)? 1 time daily  3. Are you having a reaction (difficulty breathing--STAT)?no   4. What is your medication issue? Patient's wife states that need new prescription for these medications (90 day supply) sent to [facility] on [address], [address], [place]

## 2018-12-31 NOTE — Telephone Encounter (Signed)
Left message to confirm appointment so refills can be sent in.

## 2019-01-01 ENCOUNTER — Telehealth: Payer: Self-pay | Admitting: Cardiology

## 2019-01-01 NOTE — Telephone Encounter (Signed)
LVM, reminding ppt of his appt on 01-04-19 with Dr Martinique.

## 2019-01-04 ENCOUNTER — Telehealth: Payer: Self-pay | Admitting: Neurology

## 2019-01-04 ENCOUNTER — Telehealth: Payer: Medicare HMO | Admitting: Cardiology

## 2019-01-04 NOTE — Telephone Encounter (Signed)
Pt's wife called stating that she was informed that Dr. Mar Daring office does not have the referral from this office. Pt's wife would like to know if it can be resent so that the pt can be scheduled. Please advise.

## 2019-01-05 NOTE — Telephone Encounter (Signed)
Noted thanks °

## 2019-01-05 NOTE — Telephone Encounter (Signed)
I Don't I will pull my fax and resend.

## 2019-01-05 NOTE — Telephone Encounter (Signed)
Spoke with Izora Gala, pt's wife & advised referral would be sent to Dr. Dawayne Patricia office. She verbalized appreciation and stated they had scheduled the patient already. She understands the results should be sent to Dr. Jaynee Eagles after all testing completed.

## 2019-01-06 DIAGNOSIS — M9904 Segmental and somatic dysfunction of sacral region: Secondary | ICD-10-CM | POA: Diagnosis not present

## 2019-01-06 DIAGNOSIS — M5136 Other intervertebral disc degeneration, lumbar region: Secondary | ICD-10-CM | POA: Diagnosis not present

## 2019-01-06 DIAGNOSIS — M9905 Segmental and somatic dysfunction of pelvic region: Secondary | ICD-10-CM | POA: Diagnosis not present

## 2019-01-06 DIAGNOSIS — M9903 Segmental and somatic dysfunction of lumbar region: Secondary | ICD-10-CM | POA: Diagnosis not present

## 2019-01-07 DIAGNOSIS — S72001A Fracture of unspecified part of neck of right femur, initial encounter for closed fracture: Secondary | ICD-10-CM | POA: Diagnosis not present

## 2019-01-07 DIAGNOSIS — E785 Hyperlipidemia, unspecified: Secondary | ICD-10-CM | POA: Diagnosis not present

## 2019-01-07 DIAGNOSIS — I480 Paroxysmal atrial fibrillation: Secondary | ICD-10-CM | POA: Diagnosis not present

## 2019-01-07 DIAGNOSIS — S72001D Fracture of unspecified part of neck of right femur, subsequent encounter for closed fracture with routine healing: Secondary | ICD-10-CM | POA: Diagnosis not present

## 2019-01-07 DIAGNOSIS — Z471 Aftercare following joint replacement surgery: Secondary | ICD-10-CM | POA: Diagnosis not present

## 2019-01-07 DIAGNOSIS — Z96641 Presence of right artificial hip joint: Secondary | ICD-10-CM | POA: Diagnosis not present

## 2019-01-07 DIAGNOSIS — R1311 Dysphagia, oral phase: Secondary | ICD-10-CM | POA: Diagnosis not present

## 2019-01-07 DIAGNOSIS — Z7901 Long term (current) use of anticoagulants: Secondary | ICD-10-CM | POA: Diagnosis not present

## 2019-01-07 DIAGNOSIS — I10 Essential (primary) hypertension: Secondary | ICD-10-CM | POA: Diagnosis not present

## 2019-01-07 DIAGNOSIS — I2510 Atherosclerotic heart disease of native coronary artery without angina pectoris: Secondary | ICD-10-CM | POA: Diagnosis not present

## 2019-01-07 DIAGNOSIS — H3530 Unspecified macular degeneration: Secondary | ICD-10-CM | POA: Diagnosis not present

## 2019-01-07 DIAGNOSIS — G4733 Obstructive sleep apnea (adult) (pediatric): Secondary | ICD-10-CM | POA: Diagnosis not present

## 2019-01-08 ENCOUNTER — Other Ambulatory Visit: Payer: Self-pay | Admitting: Cardiology

## 2019-01-08 ENCOUNTER — Telehealth: Payer: Self-pay | Admitting: Orthopaedic Surgery

## 2019-01-08 DIAGNOSIS — Z7901 Long term (current) use of anticoagulants: Secondary | ICD-10-CM | POA: Diagnosis not present

## 2019-01-08 DIAGNOSIS — I2510 Atherosclerotic heart disease of native coronary artery without angina pectoris: Secondary | ICD-10-CM | POA: Diagnosis not present

## 2019-01-08 DIAGNOSIS — S72001D Fracture of unspecified part of neck of right femur, subsequent encounter for closed fracture with routine healing: Secondary | ICD-10-CM | POA: Diagnosis not present

## 2019-01-08 DIAGNOSIS — G4733 Obstructive sleep apnea (adult) (pediatric): Secondary | ICD-10-CM | POA: Diagnosis not present

## 2019-01-08 DIAGNOSIS — E785 Hyperlipidemia, unspecified: Secondary | ICD-10-CM | POA: Diagnosis not present

## 2019-01-08 DIAGNOSIS — I10 Essential (primary) hypertension: Secondary | ICD-10-CM | POA: Diagnosis not present

## 2019-01-08 DIAGNOSIS — I480 Paroxysmal atrial fibrillation: Secondary | ICD-10-CM | POA: Diagnosis not present

## 2019-01-08 DIAGNOSIS — H3530 Unspecified macular degeneration: Secondary | ICD-10-CM | POA: Diagnosis not present

## 2019-01-08 DIAGNOSIS — Z96641 Presence of right artificial hip joint: Secondary | ICD-10-CM | POA: Diagnosis not present

## 2019-01-08 DIAGNOSIS — R1311 Dysphagia, oral phase: Secondary | ICD-10-CM | POA: Diagnosis not present

## 2019-01-08 NOTE — Telephone Encounter (Signed)
Dominic [name] needs verbal order for extended OT 1x week 1 2x week 3 1x week 1  Please call # [phone]

## 2019-01-08 NOTE — Telephone Encounter (Signed)
IC verbal given.  

## 2019-01-11 DIAGNOSIS — I10 Essential (primary) hypertension: Secondary | ICD-10-CM | POA: Diagnosis not present

## 2019-01-11 DIAGNOSIS — Z96641 Presence of right artificial hip joint: Secondary | ICD-10-CM | POA: Diagnosis not present

## 2019-01-11 DIAGNOSIS — I480 Paroxysmal atrial fibrillation: Secondary | ICD-10-CM | POA: Diagnosis not present

## 2019-01-11 DIAGNOSIS — G4733 Obstructive sleep apnea (adult) (pediatric): Secondary | ICD-10-CM | POA: Diagnosis not present

## 2019-01-11 DIAGNOSIS — R1311 Dysphagia, oral phase: Secondary | ICD-10-CM | POA: Diagnosis not present

## 2019-01-11 DIAGNOSIS — E785 Hyperlipidemia, unspecified: Secondary | ICD-10-CM | POA: Diagnosis not present

## 2019-01-11 DIAGNOSIS — Z7901 Long term (current) use of anticoagulants: Secondary | ICD-10-CM | POA: Diagnosis not present

## 2019-01-11 DIAGNOSIS — I2510 Atherosclerotic heart disease of native coronary artery without angina pectoris: Secondary | ICD-10-CM | POA: Diagnosis not present

## 2019-01-11 DIAGNOSIS — S72001D Fracture of unspecified part of neck of right femur, subsequent encounter for closed fracture with routine healing: Secondary | ICD-10-CM | POA: Diagnosis not present

## 2019-01-11 DIAGNOSIS — H3530 Unspecified macular degeneration: Secondary | ICD-10-CM | POA: Diagnosis not present

## 2019-01-13 DIAGNOSIS — G4733 Obstructive sleep apnea (adult) (pediatric): Secondary | ICD-10-CM | POA: Diagnosis not present

## 2019-01-13 DIAGNOSIS — Z96641 Presence of right artificial hip joint: Secondary | ICD-10-CM | POA: Diagnosis not present

## 2019-01-13 DIAGNOSIS — I480 Paroxysmal atrial fibrillation: Secondary | ICD-10-CM | POA: Diagnosis not present

## 2019-01-13 DIAGNOSIS — Z7901 Long term (current) use of anticoagulants: Secondary | ICD-10-CM | POA: Diagnosis not present

## 2019-01-13 DIAGNOSIS — I2510 Atherosclerotic heart disease of native coronary artery without angina pectoris: Secondary | ICD-10-CM | POA: Diagnosis not present

## 2019-01-13 DIAGNOSIS — E785 Hyperlipidemia, unspecified: Secondary | ICD-10-CM | POA: Diagnosis not present

## 2019-01-13 DIAGNOSIS — S72001D Fracture of unspecified part of neck of right femur, subsequent encounter for closed fracture with routine healing: Secondary | ICD-10-CM | POA: Diagnosis not present

## 2019-01-13 DIAGNOSIS — R1311 Dysphagia, oral phase: Secondary | ICD-10-CM | POA: Diagnosis not present

## 2019-01-13 DIAGNOSIS — I10 Essential (primary) hypertension: Secondary | ICD-10-CM | POA: Diagnosis not present

## 2019-01-13 DIAGNOSIS — H3530 Unspecified macular degeneration: Secondary | ICD-10-CM | POA: Diagnosis not present

## 2019-01-18 DIAGNOSIS — R413 Other amnesia: Secondary | ICD-10-CM | POA: Diagnosis not present

## 2019-01-19 DIAGNOSIS — E785 Hyperlipidemia, unspecified: Secondary | ICD-10-CM | POA: Diagnosis not present

## 2019-01-19 DIAGNOSIS — H3530 Unspecified macular degeneration: Secondary | ICD-10-CM | POA: Diagnosis not present

## 2019-01-19 DIAGNOSIS — R1311 Dysphagia, oral phase: Secondary | ICD-10-CM | POA: Diagnosis not present

## 2019-01-19 DIAGNOSIS — Z96641 Presence of right artificial hip joint: Secondary | ICD-10-CM | POA: Diagnosis not present

## 2019-01-19 DIAGNOSIS — S72001D Fracture of unspecified part of neck of right femur, subsequent encounter for closed fracture with routine healing: Secondary | ICD-10-CM | POA: Diagnosis not present

## 2019-01-19 DIAGNOSIS — I10 Essential (primary) hypertension: Secondary | ICD-10-CM | POA: Diagnosis not present

## 2019-01-19 DIAGNOSIS — G4733 Obstructive sleep apnea (adult) (pediatric): Secondary | ICD-10-CM | POA: Diagnosis not present

## 2019-01-19 DIAGNOSIS — Z7901 Long term (current) use of anticoagulants: Secondary | ICD-10-CM | POA: Diagnosis not present

## 2019-01-19 DIAGNOSIS — I480 Paroxysmal atrial fibrillation: Secondary | ICD-10-CM | POA: Diagnosis not present

## 2019-01-19 DIAGNOSIS — I2510 Atherosclerotic heart disease of native coronary artery without angina pectoris: Secondary | ICD-10-CM | POA: Diagnosis not present

## 2019-01-21 DIAGNOSIS — Z96641 Presence of right artificial hip joint: Secondary | ICD-10-CM | POA: Diagnosis not present

## 2019-01-21 DIAGNOSIS — R1311 Dysphagia, oral phase: Secondary | ICD-10-CM | POA: Diagnosis not present

## 2019-01-21 DIAGNOSIS — I2510 Atherosclerotic heart disease of native coronary artery without angina pectoris: Secondary | ICD-10-CM | POA: Diagnosis not present

## 2019-01-21 DIAGNOSIS — I480 Paroxysmal atrial fibrillation: Secondary | ICD-10-CM | POA: Diagnosis not present

## 2019-01-21 DIAGNOSIS — H3530 Unspecified macular degeneration: Secondary | ICD-10-CM | POA: Diagnosis not present

## 2019-01-21 DIAGNOSIS — E785 Hyperlipidemia, unspecified: Secondary | ICD-10-CM | POA: Diagnosis not present

## 2019-01-21 DIAGNOSIS — Z7901 Long term (current) use of anticoagulants: Secondary | ICD-10-CM | POA: Diagnosis not present

## 2019-01-21 DIAGNOSIS — S72001D Fracture of unspecified part of neck of right femur, subsequent encounter for closed fracture with routine healing: Secondary | ICD-10-CM | POA: Diagnosis not present

## 2019-01-21 DIAGNOSIS — I10 Essential (primary) hypertension: Secondary | ICD-10-CM | POA: Diagnosis not present

## 2019-01-21 DIAGNOSIS — G4733 Obstructive sleep apnea (adult) (pediatric): Secondary | ICD-10-CM | POA: Diagnosis not present

## 2019-01-23 ENCOUNTER — Other Ambulatory Visit: Payer: Self-pay | Admitting: Cardiology

## 2019-01-25 DIAGNOSIS — I480 Paroxysmal atrial fibrillation: Secondary | ICD-10-CM | POA: Diagnosis not present

## 2019-01-25 DIAGNOSIS — R1311 Dysphagia, oral phase: Secondary | ICD-10-CM | POA: Diagnosis not present

## 2019-01-25 DIAGNOSIS — S72001D Fracture of unspecified part of neck of right femur, subsequent encounter for closed fracture with routine healing: Secondary | ICD-10-CM | POA: Diagnosis not present

## 2019-01-25 DIAGNOSIS — I2510 Atherosclerotic heart disease of native coronary artery without angina pectoris: Secondary | ICD-10-CM | POA: Diagnosis not present

## 2019-01-25 DIAGNOSIS — G4733 Obstructive sleep apnea (adult) (pediatric): Secondary | ICD-10-CM | POA: Diagnosis not present

## 2019-01-25 DIAGNOSIS — H3530 Unspecified macular degeneration: Secondary | ICD-10-CM | POA: Diagnosis not present

## 2019-01-25 DIAGNOSIS — I10 Essential (primary) hypertension: Secondary | ICD-10-CM | POA: Diagnosis not present

## 2019-01-25 DIAGNOSIS — E785 Hyperlipidemia, unspecified: Secondary | ICD-10-CM | POA: Diagnosis not present

## 2019-01-25 DIAGNOSIS — Z7901 Long term (current) use of anticoagulants: Secondary | ICD-10-CM | POA: Diagnosis not present

## 2019-01-25 DIAGNOSIS — Z96641 Presence of right artificial hip joint: Secondary | ICD-10-CM | POA: Diagnosis not present

## 2019-01-27 DIAGNOSIS — Z96641 Presence of right artificial hip joint: Secondary | ICD-10-CM | POA: Diagnosis not present

## 2019-01-27 DIAGNOSIS — I10 Essential (primary) hypertension: Secondary | ICD-10-CM | POA: Diagnosis not present

## 2019-01-27 DIAGNOSIS — R1311 Dysphagia, oral phase: Secondary | ICD-10-CM | POA: Diagnosis not present

## 2019-01-27 DIAGNOSIS — I480 Paroxysmal atrial fibrillation: Secondary | ICD-10-CM | POA: Diagnosis not present

## 2019-01-27 DIAGNOSIS — G4733 Obstructive sleep apnea (adult) (pediatric): Secondary | ICD-10-CM | POA: Diagnosis not present

## 2019-01-27 DIAGNOSIS — I2510 Atherosclerotic heart disease of native coronary artery without angina pectoris: Secondary | ICD-10-CM | POA: Diagnosis not present

## 2019-01-27 DIAGNOSIS — H3530 Unspecified macular degeneration: Secondary | ICD-10-CM | POA: Diagnosis not present

## 2019-01-27 DIAGNOSIS — S72001D Fracture of unspecified part of neck of right femur, subsequent encounter for closed fracture with routine healing: Secondary | ICD-10-CM | POA: Diagnosis not present

## 2019-01-27 DIAGNOSIS — E785 Hyperlipidemia, unspecified: Secondary | ICD-10-CM | POA: Diagnosis not present

## 2019-01-27 DIAGNOSIS — Z7901 Long term (current) use of anticoagulants: Secondary | ICD-10-CM | POA: Diagnosis not present

## 2019-01-30 ENCOUNTER — Emergency Department (HOSPITAL_BASED_OUTPATIENT_CLINIC_OR_DEPARTMENT_OTHER)
Admission: EM | Admit: 2019-01-30 | Discharge: 2019-01-30 | Disposition: A | Discharge status: Home / Self Care | Payer: Medicare HMO | Attending: Emergency Medicine | Admitting: Emergency Medicine

## 2019-01-30 ENCOUNTER — Other Ambulatory Visit: Payer: Self-pay

## 2019-01-30 ENCOUNTER — Emergency Department (HOSPITAL_BASED_OUTPATIENT_CLINIC_OR_DEPARTMENT_OTHER): Payer: Medicare HMO

## 2019-01-30 ENCOUNTER — Encounter (HOSPITAL_BASED_OUTPATIENT_CLINIC_OR_DEPARTMENT_OTHER): Payer: Self-pay | Admitting: Emergency Medicine

## 2019-01-30 DIAGNOSIS — S20222A Contusion of left back wall of thorax, initial encounter: Secondary | ICD-10-CM | POA: Insufficient documentation

## 2019-01-30 DIAGNOSIS — E785 Hyperlipidemia, unspecified: Secondary | ICD-10-CM | POA: Diagnosis not present

## 2019-01-30 DIAGNOSIS — I252 Old myocardial infarction: Secondary | ICD-10-CM | POA: Insufficient documentation

## 2019-01-30 DIAGNOSIS — S2232XA Fracture of one rib, left side, initial encounter for closed fracture: Secondary | ICD-10-CM | POA: Diagnosis not present

## 2019-01-30 DIAGNOSIS — T148XXA Other injury of unspecified body region, initial encounter: Secondary | ICD-10-CM

## 2019-01-30 DIAGNOSIS — Z951 Presence of aortocoronary bypass graft: Secondary | ICD-10-CM | POA: Diagnosis not present

## 2019-01-30 DIAGNOSIS — I4891 Unspecified atrial fibrillation: Secondary | ICD-10-CM | POA: Insufficient documentation

## 2019-01-30 DIAGNOSIS — W010XXA Fall on same level from slipping, tripping and stumbling without subsequent striking against object, initial encounter: Secondary | ICD-10-CM | POA: Diagnosis not present

## 2019-01-30 DIAGNOSIS — M545 Low back pain: Secondary | ICD-10-CM | POA: Diagnosis not present

## 2019-01-30 DIAGNOSIS — Y9301 Activity, walking, marching and hiking: Secondary | ICD-10-CM | POA: Insufficient documentation

## 2019-01-30 DIAGNOSIS — I2510 Atherosclerotic heart disease of native coronary artery without angina pectoris: Secondary | ICD-10-CM | POA: Insufficient documentation

## 2019-01-30 DIAGNOSIS — Y999 Unspecified external cause status: Secondary | ICD-10-CM | POA: Diagnosis not present

## 2019-01-30 DIAGNOSIS — Y929 Unspecified place or not applicable: Secondary | ICD-10-CM | POA: Insufficient documentation

## 2019-01-30 DIAGNOSIS — I10 Essential (primary) hypertension: Secondary | ICD-10-CM | POA: Diagnosis not present

## 2019-01-30 DIAGNOSIS — Z7901 Long term (current) use of anticoagulants: Secondary | ICD-10-CM | POA: Diagnosis not present

## 2019-01-30 DIAGNOSIS — S300XXA Contusion of lower back and pelvis, initial encounter: Secondary | ICD-10-CM | POA: Insufficient documentation

## 2019-01-30 DIAGNOSIS — S299XXA Unspecified injury of thorax, initial encounter: Secondary | ICD-10-CM | POA: Diagnosis present

## 2019-01-30 LAB — CBC
HCT: 40.7 % (ref 39.0–52.0)
Hemoglobin: 13 g/dL (ref 13.0–17.0)
MCH: 25.9 pg — ABNORMAL LOW (ref 26.0–34.0)
MCHC: 31.9 g/dL (ref 30.0–36.0)
MCV: 81.2 fL (ref 80.0–100.0)
Platelets: 168 10*3/uL (ref 150–400)
RBC: 5.01 MIL/uL (ref 4.22–5.81)
RDW: 15.9 % — ABNORMAL HIGH (ref 11.5–15.5)
WBC: 7.1 10*3/uL (ref 4.0–10.5)
nRBC: 0 % (ref 0.0–0.2)

## 2019-01-30 LAB — BASIC METABOLIC PANEL
Anion gap: 9 (ref 5–15)
BUN: 21 mg/dL (ref 8–23)
CO2: 24 mmol/L (ref 22–32)
Calcium: 9.4 mg/dL (ref 8.9–10.3)
Chloride: 107 mmol/L (ref 98–111)
Creatinine, Ser: 1.21 mg/dL (ref 0.61–1.24)
GFR calc Af Amer: >60 mL/min (ref >60)
GFR calc non Af Amer: 55 mL/min — ABNORMAL LOW (ref >60)
Glucose, Bld: 93 mg/dL (ref 70–99)
Potassium: 3.7 mmol/L (ref 3.5–5.1)
Sodium: 140 mmol/L (ref 135–145)

## 2019-01-30 NOTE — Discharge Instructions (Addendum)
Stop taking your Eliquis for the next 3 days.  Apply ice to help with the swelling.  Take Tylenol as needed for pain.  Use the incentive spirometer as instructed.  Return as needed for worsening symptoms

## 2019-01-30 NOTE — ED Notes (Signed)
Patient transported to X-ray 

## 2019-01-30 NOTE — ED Triage Notes (Signed)
Patient states that he was just getting out of bed to go to the bathroom and tripped over something on the floor. The patient is MAE x 4, no noted deformities. Patient is on blood thinners  - large hematoma to his mid to upper back
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
## 2019-02-03 ENCOUNTER — Other Ambulatory Visit: Payer: Self-pay | Admitting: Cardiology

## 2019-02-05 DIAGNOSIS — Z7901 Long term (current) use of anticoagulants: Secondary | ICD-10-CM | POA: Diagnosis not present

## 2019-02-05 DIAGNOSIS — I10 Essential (primary) hypertension: Secondary | ICD-10-CM | POA: Diagnosis not present

## 2019-02-05 DIAGNOSIS — I2510 Atherosclerotic heart disease of native coronary artery without angina pectoris: Secondary | ICD-10-CM | POA: Diagnosis not present

## 2019-02-05 DIAGNOSIS — E785 Hyperlipidemia, unspecified: Secondary | ICD-10-CM | POA: Diagnosis not present

## 2019-02-05 DIAGNOSIS — R1311 Dysphagia, oral phase: Secondary | ICD-10-CM | POA: Diagnosis not present

## 2019-02-05 DIAGNOSIS — I480 Paroxysmal atrial fibrillation: Secondary | ICD-10-CM | POA: Diagnosis not present

## 2019-02-05 DIAGNOSIS — H3530 Unspecified macular degeneration: Secondary | ICD-10-CM | POA: Diagnosis not present

## 2019-02-05 DIAGNOSIS — Z96641 Presence of right artificial hip joint: Secondary | ICD-10-CM | POA: Diagnosis not present

## 2019-02-05 DIAGNOSIS — G4733 Obstructive sleep apnea (adult) (pediatric): Secondary | ICD-10-CM | POA: Diagnosis not present

## 2019-02-05 DIAGNOSIS — S72001D Fracture of unspecified part of neck of right femur, subsequent encounter for closed fracture with routine healing: Secondary | ICD-10-CM | POA: Diagnosis not present

## 2019-02-07 DIAGNOSIS — S72001A Fracture of unspecified part of neck of right femur, initial encounter for closed fracture: Secondary | ICD-10-CM | POA: Diagnosis not present

## 2019-02-07 DIAGNOSIS — Z471 Aftercare following joint replacement surgery: Secondary | ICD-10-CM | POA: Diagnosis not present

## 2019-02-09 NOTE — Progress Notes (Deleted)
Cardiology Office Note   Date:  02/09/2019   ID:  Dominic, [name] 09/10/1933, MRN [id number]  PCP:  Lawerance Cruel, MD  Cardiologist: Menna Abeln Martinique MD  No chief complaint on file.     History of Present Illness: Dominic [name] is a 85 y.o. male who is seen for follow up CAD and Afib.    He has had known episodes of paroxysmal atrial fibrillation since June 2015. He has been on long-term anticoagulation with Eliquis.   The patient has a history of known coronary disease. He has had prior stents.with  BMS to the RCA and subsequent Cypher DES to the RCA in 3/06. LHC in 12/06 demonstrated significant RCA ISR which was treated with a Taxus DES. Other history includes HTN, HL, sleep apnea, PAF.   Admitted in 6/15 with AF with RVR. Inpatient Myoview was low risk.   On 03/09/15 the patient presented to the emergency room with a non-STEMI. He underwent cath on 03/10/2015 w/ successful PCI to the mid-RCA for in-stent restenosis with cutting balloon angioplasty and PCI to an OM2 lesion. There was residual LAD/D1 bifurcation lesion and staged PCI was done 03/13/2015. Following this procedure the patient developed ataxia, speech difficulties and was diagnosed with embolic CVA.   He was seen in the ED on 01/30/19 after a mechanical fall. He had a contusion of his back and bruising on his hip. Xray showed a rib fracture. Recommended to hold Eliquis for 3 days.   On follow up today he states he lost his job due to his impaired balance and ataxia.  He reports Neuro testing demonstrated good cognitive function. Still working with speech therapy. He denies any angina. He is going to repeat a sleep study. States CPAP therapy never really helped. No bleeding issues.  He has been eating a whole food/plant based diet now. He is intolerant of statins and Zetia.   Past Medical History:  Diagnosis Date  . Amputated finger    a. L index d/t to dog bite.  . Atrial fibrillation with RVR ([place])    a. New  onset diagnosed 11/20/2013, spont converted to NSR.  Marland Kitchen Cataracts, bilateral   . Concussion   . Coronary artery disease 09/2004, 04/2005, 02/2015   a. Stent to prox and mid RCA 08/2001. b. DES to RCA for ISR 08/2004. c. DES to [facility] for ISR 05/2005. d. Low risk nuc 10/2013 (done for CP in setting of new AF). e. PCI to the mid-RCA for in-stent restenosis with cutting balloon angioplasty f. PCI to an OM2 lesion  . Dyslipidemia    a. Intol of statins.  . Fracture acetabulum-closed ([place]) 2015  . Fractured pelvis ([place]) 2015  . Hypertension   . Lung nodule    , right upper lobe  . Macular degeneration    both eyes, receives shots in his eyes  . Obstructive sleep apnea   . OSA (obstructive sleep apnea)   . Prostate cancer ([place])   . RBBB (right bundle branch block)     Past Surgical History:  Procedure Laterality Date  . ANTERIOR CRUCIATE LIGAMENT REPAIR Right   . CARDIAC CATHETERIZATION N/A 03/10/2015   Procedure: Left Heart Cath and Coronary Angiography;  Surgeon: Lorretta Harp, MD;  Location: [place] [facility];  Service: Cardiovascular;  Laterality: N/A;  . CARDIAC CATHETERIZATION  03/13/2015   Procedure: Coronary Stent Intervention;  Surgeon: Lorimer Tiberio M Martinique, MD;  Location: [place] [facility];  Service: Cardiovascular;;  .  CARDIAC CATHETERIZATION  03/13/2015   Procedure: Intravascular Pressure Wire/FFR Study;  Surgeon: Dellie Piasecki M Martinique, MD;  Location: [place] [facility];  Service: Cardiovascular;;  . CATARACT EXTRACTION Left 08/2017  . CATARACT EXTRACTION Right 10/06/2017  . CORONARY STENT PLACEMENT    . L knee ligament replacement    . PROSTATECTOMY    . TONSILLECTOMY    . TOOTH EXTRACTION    . TOTAL HIP ARTHROPLASTY Right 09/27/2018   Procedure: TOTAL HIP ARTHROPLASTY ANTERIOR APPROACH;  Surgeon: Mcarthur Rossetti, MD;  Location: [place];  Service: Orthopedics;  Laterality: Right;     Current Outpatient Medications  Medication Sig Dispense Refill  . amLODipine (NORVASC) 5 MG tablet  Take 1 tablet (5 mg total) by mouth daily. Please keep upcoming appt in August with Dr. Martinique for future refills. Thank you 30 tablet 0  . clopidogrel (PLAVIX) 75 MG tablet TAKE 1 TABLET BY MOUTH DAILY *SCHEDULE APPOINTMENT FOR REFILLS* 15 tablet 0  . doxycycline (VIBRAMYCIN) 100 MG capsule Take 1 capsule (100 mg total) by mouth 2 (two) times daily. One po bid x 7 days 14 capsule 0  . ELIQUIS 5 MG TABS tablet TAKE 1 TABLET BY MOUTH TWICE A DAY *NEED APPOINTMENT FOR REFILLS* 180 tablet 0  . HYDROcodone-acetaminophen (NORCO) 7.5-325 MG tablet Take 1 tablet by mouth every 6 (six) hours as needed for severe pain (pain score 7-10). 10 tablet 0  . metoprolol succinate (TOPROL-XL) 25 MG 24 hr tablet Take 3 tablets (75 mg total) by mouth daily. 270 tablet 1  . Multiple Vitamins-Minerals (PRESERVISION AREDS) CAPS Take 1 capsule by mouth 2 (two) times daily.    . nitroGLYCERIN (NITROSTAT) 0.4 MG SL tablet PLACE ONE TABLET UNDER THE TONGUE EVERY 5 MINUTES AS NEEDED FOR CHEST PAIN (Patient taking differently: Place 0.4 mg under the tongue every 5 (five) minutes as needed. ) 25 tablet 3  . senna (SENOKOT) 8.6 MG TABS tablet Take 1 tablet (8.6 mg total) by mouth daily. 10 each 0  . tiZANidine (ZANAFLEX) 2 MG tablet Take 2 mg by mouth every 6 (six) hours as needed for muscle spasms.    . Ubiquinol 100 MG CAPS Take 100 mg by mouth 2 (two) times daily.     . vitamin B-12 (CYANOCOBALAMIN) 1000 MCG tablet Take 1,000 mcg by mouth daily.     No current facility-administered medications for this visit.     Allergies:   Statins and Zetia [ezetimibe]    Social History:  The patient  reports that he has never smoked. He has never used smokeless tobacco. He reports that he does not drink alcohol or use drugs.   Family History:  The patient's family history includes Cancer in his father; Emphysema in his mother.    ROS:  Please see the history of present illness.   Otherwise, review of systems are positive for none.    All other systems are reviewed and negative.    PHYSICAL EXAM: VS:  There were no vitals taken for this visit. , BMI There is no height or weight on file to calculate BMI. GENERAL:  Well appearing, WM in NAD HEENT:  PERRL, EOMI, sclera are clear. Oropharynx is clear. NECK:  No jugular venous distention, carotid upstroke brisk and symmetric, no bruits, no thyromegaly or adenopathy LUNGS:  Clear to auscultation bilaterally CHEST:  Unremarkable HEART:  RRR,  PMI not displaced or sustained,S1 and S2 within normal limits, no S3, no S4: no clicks, no rubs, no murmurs ABD:  Soft, nontender. BS +, no masses or bruits. No hepatomegaly, no splenomegaly EXT:  2 + pulses throughout, no edema, no cyanosis no clubbing SKIN:  Warm and dry.  No rashes NEURO:  Alert and oriented x 3. Cranial nerves II through XII intact. Some ataxia.  PSYCH:  Cognitively intact     EKG:  EKG is  Not ordered today.   Recent Labs: 09/30/2018: ALT 15; Magnesium 2.2 01/30/2019: BUN 21; Creatinine, Ser 1.21; Hemoglobin 13.0; Platelets 168; Potassium 3.7; Sodium 140    Lipid Panel    Component Value Date/Time   CHOL 186 11/27/2015 0919   TRIG 246 (H) 11/27/2015 0919   HDL 29 (L) 11/27/2015 0919   CHOLHDL 6.4 (H) 11/27/2015 0919   VLDL 49 (H) 11/27/2015 0919   LDLCALC 108 11/27/2015 0919   LDLDIRECT 146.0 12/28/2014 1057    Labs dated 05/13/17: cholesterol 257, triglycerides 282, HDL 42, LDL 158. CMET normal.   Wt Readings from Last 3 Encounters:  01/30/19 180 lb (81.6 kg)  11/08/18 186 lb (84.4 kg)  09/26/18 194 lb (88 kg)        ASSESSMENT AND PLAN:  1.  Coronary artery disease. History of multiple stents as noted above, most recently 03/10/15 with PTCA of the RCA for instent restenosis, DES of OM, and LAD stenting. Given multiple episodes of in stent restenosis in the RCA with first generation DES I would recommend continued long-term Plavix unless he has significant bleeding. No ASA since on Eliquis.  2.   Paroxysmal atrial fibrillation, maintaining normal sinus rhythm.  Continue long-term Apixaban for CHA2DS2-VASc Score  4 3. CVA- embolic post cath procedure with some persistent ataxia and slurred speech.  4.  Hypercholesterolemia, intolerant of statins and Zetia. Recommend evaluation with our lipid clinic to see if he is a candidate for PCSK 9 inhibitor.  5.  Essential hypertension, controlled on current medication 6.  History of sleep apnea, patient is on CPAP therapy-planning to repeat sleep study. 7. History of ataxia with recent fall and rib fracture.   Follow up in 6 months.    Current medicines are reviewed at length with the patient today.  The patient does not have concerns regarding medicines.  The following changes have been made:  no change  Labs/ tests ordered today include:   No orders of the defined types were placed in this encounter.   Signed, Dominic Alderman Martinique MD, [facility]    02/09/2019 8:39 AM    [place]

## 2019-02-10 ENCOUNTER — Other Ambulatory Visit: Payer: Self-pay | Admitting: Cardiology

## 2019-02-10 ENCOUNTER — Ambulatory Visit: Payer: Medicare HMO | Admitting: Cardiology

## 2019-02-16 ENCOUNTER — Other Ambulatory Visit: Payer: Self-pay | Admitting: Cardiology

## 2019-02-16 NOTE — Telephone Encounter (Signed)
Please review for refill. Thank you! 

## 2019-02-16 NOTE — Telephone Encounter (Signed)
78m 88.5kg Scr 1.21 01/30/19 Lovw/jordan 08/22/17

## 2019-02-23 DIAGNOSIS — E785 Hyperlipidemia, unspecified: Secondary | ICD-10-CM | POA: Diagnosis not present

## 2019-02-23 DIAGNOSIS — I2510 Atherosclerotic heart disease of native coronary artery without angina pectoris: Secondary | ICD-10-CM | POA: Diagnosis not present

## 2019-02-23 DIAGNOSIS — I10 Essential (primary) hypertension: Secondary | ICD-10-CM | POA: Diagnosis not present

## 2019-02-23 DIAGNOSIS — H3530 Unspecified macular degeneration: Secondary | ICD-10-CM | POA: Diagnosis not present

## 2019-02-23 DIAGNOSIS — S72001D Fracture of unspecified part of neck of right femur, subsequent encounter for closed fracture with routine healing: Secondary | ICD-10-CM | POA: Diagnosis not present

## 2019-02-23 DIAGNOSIS — Z7901 Long term (current) use of anticoagulants: Secondary | ICD-10-CM | POA: Diagnosis not present

## 2019-02-23 DIAGNOSIS — R1311 Dysphagia, oral phase: Secondary | ICD-10-CM | POA: Diagnosis not present

## 2019-02-23 DIAGNOSIS — G4733 Obstructive sleep apnea (adult) (pediatric): Secondary | ICD-10-CM | POA: Diagnosis not present

## 2019-02-23 DIAGNOSIS — Z96641 Presence of right artificial hip joint: Secondary | ICD-10-CM | POA: Diagnosis not present

## 2019-02-23 DIAGNOSIS — I480 Paroxysmal atrial fibrillation: Secondary | ICD-10-CM | POA: Diagnosis not present

## 2019-02-24 DIAGNOSIS — H6121 Impacted cerumen, right ear: Secondary | ICD-10-CM | POA: Diagnosis not present

## 2019-02-25 ENCOUNTER — Encounter (INDEPENDENT_AMBULATORY_CARE_PROVIDER_SITE_OTHER): Payer: Medicare HMO | Admitting: Ophthalmology

## 2019-02-25 ENCOUNTER — Other Ambulatory Visit: Payer: Self-pay

## 2019-02-25 ENCOUNTER — Other Ambulatory Visit: Payer: Self-pay | Admitting: Cardiology

## 2019-02-25 DIAGNOSIS — H43813 Vitreous degeneration, bilateral: Secondary | ICD-10-CM

## 2019-02-25 DIAGNOSIS — I10 Essential (primary) hypertension: Secondary | ICD-10-CM

## 2019-02-25 DIAGNOSIS — D3132 Benign neoplasm of left choroid: Secondary | ICD-10-CM | POA: Diagnosis not present

## 2019-02-25 DIAGNOSIS — H35033 Hypertensive retinopathy, bilateral: Secondary | ICD-10-CM | POA: Diagnosis not present

## 2019-02-25 DIAGNOSIS — H353231 Exudative age-related macular degeneration, bilateral, with active choroidal neovascularization: Secondary | ICD-10-CM

## 2019-02-26 DIAGNOSIS — H401212 Low-tension glaucoma, right eye, moderate stage: Secondary | ICD-10-CM | POA: Diagnosis not present

## 2019-02-26 DIAGNOSIS — H401223 Low-tension glaucoma, left eye, severe stage: Secondary | ICD-10-CM | POA: Diagnosis not present

## 2019-02-27 ENCOUNTER — Other Ambulatory Visit: Payer: Self-pay | Admitting: Cardiology

## 2019-02-27 DIAGNOSIS — I10 Essential (primary) hypertension: Secondary | ICD-10-CM | POA: Diagnosis not present

## 2019-02-27 DIAGNOSIS — S72001D Fracture of unspecified part of neck of right femur, subsequent encounter for closed fracture with routine healing: Secondary | ICD-10-CM | POA: Diagnosis not present

## 2019-02-27 DIAGNOSIS — G4733 Obstructive sleep apnea (adult) (pediatric): Secondary | ICD-10-CM | POA: Diagnosis not present

## 2019-02-27 DIAGNOSIS — R1311 Dysphagia, oral phase: Secondary | ICD-10-CM | POA: Diagnosis not present

## 2019-02-27 DIAGNOSIS — E785 Hyperlipidemia, unspecified: Secondary | ICD-10-CM | POA: Diagnosis not present

## 2019-02-27 DIAGNOSIS — H3530 Unspecified macular degeneration: Secondary | ICD-10-CM | POA: Diagnosis not present

## 2019-02-27 DIAGNOSIS — Z7901 Long term (current) use of anticoagulants: Secondary | ICD-10-CM | POA: Diagnosis not present

## 2019-02-27 DIAGNOSIS — I480 Paroxysmal atrial fibrillation: Secondary | ICD-10-CM | POA: Diagnosis not present

## 2019-02-27 DIAGNOSIS — I2510 Atherosclerotic heart disease of native coronary artery without angina pectoris: Secondary | ICD-10-CM | POA: Diagnosis not present

## 2019-02-27 DIAGNOSIS — Z96641 Presence of right artificial hip joint: Secondary | ICD-10-CM | POA: Diagnosis not present

## 2019-03-01 DIAGNOSIS — I480 Paroxysmal atrial fibrillation: Secondary | ICD-10-CM | POA: Diagnosis not present

## 2019-03-01 DIAGNOSIS — I10 Essential (primary) hypertension: Secondary | ICD-10-CM | POA: Diagnosis not present

## 2019-03-01 DIAGNOSIS — H3530 Unspecified macular degeneration: Secondary | ICD-10-CM | POA: Diagnosis not present

## 2019-03-01 DIAGNOSIS — S72001D Fracture of unspecified part of neck of right femur, subsequent encounter for closed fracture with routine healing: Secondary | ICD-10-CM | POA: Diagnosis not present

## 2019-03-01 DIAGNOSIS — Z7901 Long term (current) use of anticoagulants: Secondary | ICD-10-CM | POA: Diagnosis not present

## 2019-03-01 DIAGNOSIS — G4733 Obstructive sleep apnea (adult) (pediatric): Secondary | ICD-10-CM | POA: Diagnosis not present

## 2019-03-01 DIAGNOSIS — I2510 Atherosclerotic heart disease of native coronary artery without angina pectoris: Secondary | ICD-10-CM | POA: Diagnosis not present

## 2019-03-01 DIAGNOSIS — R1311 Dysphagia, oral phase: Secondary | ICD-10-CM | POA: Diagnosis not present

## 2019-03-01 DIAGNOSIS — E785 Hyperlipidemia, unspecified: Secondary | ICD-10-CM | POA: Diagnosis not present

## 2019-03-01 DIAGNOSIS — Z96641 Presence of right artificial hip joint: Secondary | ICD-10-CM | POA: Diagnosis not present

## 2019-03-03 DIAGNOSIS — I480 Paroxysmal atrial fibrillation: Secondary | ICD-10-CM | POA: Diagnosis not present

## 2019-03-03 DIAGNOSIS — I2510 Atherosclerotic heart disease of native coronary artery without angina pectoris: Secondary | ICD-10-CM | POA: Diagnosis not present

## 2019-03-03 DIAGNOSIS — Z7901 Long term (current) use of anticoagulants: Secondary | ICD-10-CM | POA: Diagnosis not present

## 2019-03-03 DIAGNOSIS — E785 Hyperlipidemia, unspecified: Secondary | ICD-10-CM | POA: Diagnosis not present

## 2019-03-03 DIAGNOSIS — G4733 Obstructive sleep apnea (adult) (pediatric): Secondary | ICD-10-CM | POA: Diagnosis not present

## 2019-03-03 DIAGNOSIS — R1311 Dysphagia, oral phase: Secondary | ICD-10-CM | POA: Diagnosis not present

## 2019-03-03 DIAGNOSIS — H3530 Unspecified macular degeneration: Secondary | ICD-10-CM | POA: Diagnosis not present

## 2019-03-03 DIAGNOSIS — I10 Essential (primary) hypertension: Secondary | ICD-10-CM | POA: Diagnosis not present

## 2019-03-03 DIAGNOSIS — Z96641 Presence of right artificial hip joint: Secondary | ICD-10-CM | POA: Diagnosis not present

## 2019-03-03 DIAGNOSIS — S72001D Fracture of unspecified part of neck of right femur, subsequent encounter for closed fracture with routine healing: Secondary | ICD-10-CM | POA: Diagnosis not present

## 2019-03-04 DIAGNOSIS — L03119 Cellulitis of unspecified part of limb: Secondary | ICD-10-CM | POA: Diagnosis not present

## 2019-03-10 DIAGNOSIS — S72001A Fracture of unspecified part of neck of right femur, initial encounter for closed fracture: Secondary | ICD-10-CM | POA: Diagnosis not present

## 2019-03-10 DIAGNOSIS — Z471 Aftercare following joint replacement surgery: Secondary | ICD-10-CM | POA: Diagnosis not present

## 2019-03-11 ENCOUNTER — Other Ambulatory Visit: Payer: Self-pay | Admitting: Cardiology

## 2019-03-11 DIAGNOSIS — L03116 Cellulitis of left lower limb: Secondary | ICD-10-CM | POA: Diagnosis not present

## 2019-03-11 DIAGNOSIS — L03115 Cellulitis of right lower limb: Secondary | ICD-10-CM | POA: Diagnosis not present

## 2019-03-11 NOTE — Telephone Encounter (Signed)
40m 88.5kg Scr 1.21 01/30/19 Lovw/jordan 08/22/17

## 2019-03-12 NOTE — Progress Notes (Signed)
Virtual Visit via Telephone Note   This visit type was conducted due to national recommendations for restrictions regarding the COVID-19 Pandemic (e.g. social distancing) in an effort to limit this patient's exposure and mitigate transmission in our community.  Due to his co-morbid illnesses, this patient is at least at moderate risk for complications without adequate follow up.  This format is felt to be most appropriate for this patient at this time.  The patient did not have access to video technology/had technical difficulties with video requiring transitioning to audio format only (telephone).  All issues noted in this document were discussed and addressed.  No physical exam could be performed with this format.  Please refer to the patient's chart for his  consent to telehealth for [name].   Date:  03/16/2019   ID:  Dominic [name], DOB 1933/09/10, MRN [id number]  Patient Location: Home Provider Location: Home  PCP:  Lawerance Cruel, MD  Cardiologist:  Elizabth Palka Martinique, MD  Electrophysiologist:  None   Evaluation Performed:  Follow-Up Visit  Chief Complaint:  CAD and Afib  History of Present Illness:    Dominic [name] is a 85 y.o. male with history of Afib and CAD. Last seen in March 2019. He has had known episodes of paroxysmal atrial fibrillation since June 2015. He has been on long-term anticoagulation with Eliquis.   The patient has a history of known coronary disease. He has had prior stents.with  BMS to the RCA and subsequent Cypher DES to the RCA in 3/06. LHC in 12/06 demonstrated significant RCA ISR which was treated with a Taxus DES. Other history includes HTN, HL, sleep apnea, PAF.   Admitted in 6/15 with AF with RVR. Inpatient Myoview was low risk.   On 03/09/15 the patient presented to the emergency room with a non-STEMI. He underwent cath on 03/10/2015 w/ successful PCI to the mid-RCA for in-stent restenosis with cutting balloon angioplasty and PCI to an OM2  lesion. There was residual LAD/D1 bifurcation lesion and staged PCI was done 03/13/2015. Following this procedure the patient developed ataxia, speech difficulties and was diagnosed with embolic CVA.   He did have a bad fall in January and hit his head. Suffered TBI and according to wife he has not been right since. CT did not show any bleed. Repeat CT in April was stable.   In April he fell and fractured his hip. Was in prolonged Rehab.   He was seen in the ED on 01/30/19 after a mechanical fall. He had a contusion of his back and bruising on his hip. Xray showed a rib fracture. Recommended to hold Eliquis for 3 days. He is able to ambulate with a walker.   He does have a pressure sore on his leg managed by Dr Harrington Challenger.   He currently denies any chest pain or SOB. No palpitations. No dizziness. Gait is poor.   They are planning to move to [place] next week where he has more family support.   The patient does not have symptoms concerning for COVID-19 infection (fever, chills, cough, or new shortness of breath).    Past Medical History:  Diagnosis Date  . Amputated finger    a. L index d/t to dog bite.  . Atrial fibrillation with RVR ([place])    a. New onset diagnosed 11/20/2013, spont converted to NSR.  Marland Kitchen Cataracts, bilateral   . Concussion   . Coronary artery disease 09/2004, 04/2005, 02/2015   a. Stent to prox and  mid RCA 08/2001. b. DES to RCA for ISR 08/2004. c. DES to [facility] for ISR 05/2005. d. Low risk nuc 10/2013 (done for CP in setting of new AF). e. PCI to the mid-RCA for in-stent restenosis with cutting balloon angioplasty f. PCI to an OM2 lesion  . Dyslipidemia    a. Intol of statins.  . Fracture acetabulum-closed ([place]) 2015  . Fractured pelvis ([place]) 2015  . Hypertension   . Lung nodule    , right upper lobe  . Macular degeneration    both eyes, receives shots in his eyes  . Obstructive sleep apnea   . OSA (obstructive sleep apnea)   . Prostate cancer ([place])   . RBBB  (right bundle branch block)    Past Surgical History:  Procedure Laterality Date  . ANTERIOR CRUCIATE LIGAMENT REPAIR Right   . CARDIAC CATHETERIZATION N/A 03/10/2015   Procedure: Left Heart Cath and Coronary Angiography;  Surgeon: Lorretta Harp, MD;  Location: [place] [facility];  Service: Cardiovascular;  Laterality: N/A;  . CARDIAC CATHETERIZATION  03/13/2015   Procedure: Coronary Stent Intervention;  Surgeon: Teighan Aubert M Martinique, MD;  Location: [place] [facility];  Service: Cardiovascular;;  . CARDIAC CATHETERIZATION  03/13/2015   Procedure: Intravascular Pressure Wire/FFR Study;  Surgeon: Georgeanna Radziewicz M Martinique, MD;  Location: [place] [facility];  Service: Cardiovascular;;  . CATARACT EXTRACTION Left 08/2017  . CATARACT EXTRACTION Right 10/06/2017  . CORONARY STENT PLACEMENT    . L knee ligament replacement    . PROSTATECTOMY    . TONSILLECTOMY    . TOOTH EXTRACTION    . TOTAL HIP ARTHROPLASTY Right 09/27/2018   Procedure: TOTAL HIP ARTHROPLASTY ANTERIOR APPROACH;  Surgeon: Mcarthur Rossetti, MD;  Location: [facility];  Service: Orthopedics;  Laterality: Right;     Current Meds  Medication Sig  . apixaban (ELIQUIS) 5 MG TABS tablet Take 1 tablet (5 mg total) by mouth 2 (two) times daily.  Marland Kitchen HYDROcodone-acetaminophen (NORCO) 7.5-325 MG tablet Take 1 tablet by mouth every 6 (six) hours as needed for severe pain (pain score 7-10).  . metoprolol succinate (TOPROL-XL) 25 MG 24 hr tablet Take 3 tablets (75 mg total) by mouth daily.  . Multiple Vitamins-Minerals (PRESERVISION AREDS) CAPS Take 1 capsule by mouth 2 (two) times daily.  . nitroGLYCERIN (NITROSTAT) 0.4 MG SL tablet PLACE ONE TABLET UNDER THE TONGUE EVERY 5 MINUTES AS NEEDED FOR CHEST PAIN (Patient taking differently: Place 0.4 mg under the tongue every 5 (five) minutes as needed. )  . senna (SENOKOT) 8.6 MG TABS tablet Take 1 tablet (8.6 mg total) by mouth daily.  Marland Kitchen sulfamethoxazole-trimethoprim (BACTRIM) 400-80 MG tablet Take 1 tablet by  mouth 2 (two) times daily.  Marland Kitchen tiZANidine (ZANAFLEX) 2 MG tablet Take 2 mg by mouth every 6 (six) hours as needed for muscle spasms.  . Ubiquinol 100 MG CAPS Take 100 mg by mouth 2 (two) times daily.   . vitamin B-12 (CYANOCOBALAMIN) 1000 MCG tablet Take 1,000 mcg by mouth daily.  . [DISCONTINUED] amLODipine (NORVASC) 5 MG tablet TAKE 1 TABLET BY MOUTH EVERY DAY  . [DISCONTINUED] apixaban (ELIQUIS) 5 MG TABS tablet Take 1 tablet (5 mg total) by mouth 2 (two) times daily.  . [DISCONTINUED] clopidogrel (PLAVIX) 75 MG tablet TAKE 1 TABLET BY MOUTH EVERY DAY  . [DISCONTINUED] metoprolol succinate (TOPROL-XL) 25 MG 24 hr tablet Take 3 tablets (75 mg total) by mouth daily.     Allergies:   Statins and Zetia [ezetimibe]  Social History   Tobacco Use  . Smoking status: Never Smoker  . Smokeless tobacco: Never Used  Substance Use Topics  . Alcohol use: No  . Drug use: No     Family Hx: The patient's family history includes Cancer in his father; Emphysema in his mother.  ROS:   Please see the history of present illness.    All other systems reviewed and are negative.   Prior CV studies:   The following studies were reviewed today:  none  Labs/Other Tests and Data Reviewed:    EKG:  No ECG reviewed.  Recent Labs: 09/30/2018: ALT 15; Magnesium 2.2 01/30/2019: BUN 21; Creatinine, Ser 1.21; Hemoglobin 13.0; Platelets 168; Potassium 3.7; Sodium 140   Recent Lipid Panel Lab Results  Component Value Date/Time   CHOL 186 11/27/2015 09:19 AM   TRIG 246 (H) 11/27/2015 09:19 AM   HDL 29 (L) 11/27/2015 09:19 AM   CHOLHDL 6.4 (H) 11/27/2015 09:19 AM   LDLCALC 108 11/27/2015 09:19 AM   LDLDIRECT 146.0 12/28/2014 10:57 AM    Labs dated 05/13/17: cholesterol 257, triglycerides 282, HDL 42, LDL 158. CMET normal.   Wt Readings from Last 3 Encounters:  03/16/19 185 lb (83.9 kg)  01/30/19 180 lb (81.6 kg)  11/08/18 186 lb (84.4 kg)     Objective:    Vital Signs:  BP (!) 123/59   Pulse  66   Ht 5\' 10"  (1.778 m)   Wt 185 lb (83.9 kg)   BMI 26.54 kg/m    VITAL SIGNS:  reviewed  ASSESSMENT & PLAN:    1.  Coronary artery disease. History of multiple stents as noted above, most recently 03/10/15 with PTCA of the RCA for instent restenosis, DES of OM, and LAD stenting. Given multiple falls and high risk of bleeding I have recommended stopping Plavix at this point. Continue Eliquis only. 2.  Paroxysmal atrial fibrillation, maintaining normal sinus rhythm.  Continue long-term Apixaban for CHA2DS2-VASc Score  4 3. CVA- embolic post cath procedure with some persistent ataxia and slurred speech.  4.  Hypercholesterolemia, intolerant of statins and Zetia. He never followed up with lipid clinic. May need to consider PCSk 9 inhibitor.  5.  Essential hypertension, controlled on current medication 6.  History of sleep apnea 7. History of ataxia with recent fall and rib fracture.    COVID-19 Education: The signs and symptoms of COVID-19 were discussed with the patient and how to seek care for testing (follow up with PCP or arrange E-visit).  The importance of social distancing was discussed today.  Time:   Today, I have spent 20 minutes with the patient with telehealth technology discussing the above problems.     Medication Adjustments/Labs and Tests Ordered: Current medicines are reviewed at length with the patient today.  Concerns regarding medicines are outlined above.   Tests Ordered: No orders of the defined types were placed in this encounter.   Medication Changes: Meds ordered this encounter  Medications  . metoprolol succinate (TOPROL-XL) 25 MG 24 hr tablet    Sig: Take 3 tablets (75 mg total) by mouth daily.    Dispense:  270 tablet    Refill:  1  . apixaban (ELIQUIS) 5 MG TABS tablet    Sig: Take 1 tablet (5 mg total) by mouth 2 (two) times daily.    Dispense:  180 tablet    Refill:  1  . amLODipine (NORVASC) 5 MG tablet    Sig: Take 1 tablet (5 mg total)  by  mouth daily.    Dispense:  180 tablet    Refill:  3    Follow Up:  PRN. Patient will need to establish with Cardiology in [place], Zollie Clemence Martinique, MD  03/16/2019 9:35 AM    [place]

## 2019-03-15 DIAGNOSIS — I872 Venous insufficiency (chronic) (peripheral): Secondary | ICD-10-CM | POA: Diagnosis not present

## 2019-03-15 DIAGNOSIS — L989 Disorder of the skin and subcutaneous tissue, unspecified: Secondary | ICD-10-CM | POA: Diagnosis not present

## 2019-03-16 ENCOUNTER — Telehealth (INDEPENDENT_AMBULATORY_CARE_PROVIDER_SITE_OTHER): Payer: Medicare HMO | Admitting: Cardiology

## 2019-03-16 ENCOUNTER — Encounter: Payer: Self-pay | Admitting: Cardiology

## 2019-03-16 VITALS — BP 123/59 | HR 66 | Ht 70.0 in | Wt 185.0 lb

## 2019-03-16 DIAGNOSIS — I4510 Unspecified right bundle-branch block: Secondary | ICD-10-CM

## 2019-03-16 DIAGNOSIS — Z9861 Coronary angioplasty status: Secondary | ICD-10-CM | POA: Diagnosis not present

## 2019-03-16 DIAGNOSIS — I480 Paroxysmal atrial fibrillation: Secondary | ICD-10-CM

## 2019-03-16 DIAGNOSIS — I2510 Atherosclerotic heart disease of native coronary artery without angina pectoris: Secondary | ICD-10-CM

## 2019-03-16 DIAGNOSIS — E7800 Pure hypercholesterolemia, unspecified: Secondary | ICD-10-CM

## 2019-03-16 MED ORDER — APIXABAN 5 MG PO TABS: 5.0000 mg | ORAL_TABLET | Freq: Two times a day (BID) | ORAL | 1 refills | Status: DC

## 2019-03-16 MED ORDER — METOPROLOL SUCCINATE ER 25 MG PO TB24: 75.0000 mg | ORAL_TABLET | Freq: Every day | ORAL | 1 refills | Status: AC

## 2019-03-16 MED ORDER — AMLODIPINE BESYLATE 5 MG PO TABS: 5.0000 mg | ORAL_TABLET | Freq: Every day | ORAL | 3 refills | Status: DC

## 2019-03-16 NOTE — Patient Instructions (Signed)
Stop Plavix   Continue your other therapy  We will see you as needed.

## 2019-04-09 DIAGNOSIS — Z471 Aftercare following joint replacement surgery: Secondary | ICD-10-CM | POA: Diagnosis not present

## 2019-04-09 DIAGNOSIS — S72001A Fracture of unspecified part of neck of right femur, initial encounter for closed fracture: Secondary | ICD-10-CM | POA: Diagnosis not present

## 2019-05-10 DIAGNOSIS — Z471 Aftercare following joint replacement surgery: Secondary | ICD-10-CM | POA: Diagnosis not present

## 2019-05-10 DIAGNOSIS — S72001A Fracture of unspecified part of neck of right femur, initial encounter for closed fracture: Secondary | ICD-10-CM | POA: Diagnosis not present

## 2019-06-08 DIAGNOSIS — L089 Local infection of the skin and subcutaneous tissue, unspecified: Secondary | ICD-10-CM | POA: Diagnosis not present

## 2019-06-08 DIAGNOSIS — L602 Onychogryphosis: Secondary | ICD-10-CM | POA: Diagnosis not present

## 2019-06-08 NOTE — Telephone Encounter (Signed)
We received neuropsych test results from Dr. Dawayne Patricia office. Copy sent to medical records for scanning. Results ready for Dr. Cathren Laine review. Pt's next appt with Dr. Jaynee Eagles is 08/02/19.

## 2019-06-09 DIAGNOSIS — S72001A Fracture of unspecified part of neck of right femur, initial encounter for closed fracture: Secondary | ICD-10-CM | POA: Diagnosis not present

## 2019-06-09 DIAGNOSIS — Z471 Aftercare following joint replacement surgery: Secondary | ICD-10-CM | POA: Diagnosis not present

## 2019-06-13 DIAGNOSIS — S0003XA Contusion of scalp, initial encounter: Secondary | ICD-10-CM | POA: Diagnosis not present

## 2019-06-13 DIAGNOSIS — Z79899 Other long term (current) drug therapy: Secondary | ICD-10-CM | POA: Diagnosis not present

## 2019-06-13 DIAGNOSIS — W19XXXA Unspecified fall, initial encounter: Secondary | ICD-10-CM | POA: Diagnosis not present

## 2019-06-13 DIAGNOSIS — Z96641 Presence of right artificial hip joint: Secondary | ICD-10-CM | POA: Diagnosis not present

## 2019-06-13 DIAGNOSIS — S0990XA Unspecified injury of head, initial encounter: Secondary | ICD-10-CM | POA: Diagnosis not present

## 2019-06-13 DIAGNOSIS — N189 Chronic kidney disease, unspecified: Secondary | ICD-10-CM | POA: Diagnosis not present

## 2019-06-13 DIAGNOSIS — I499 Cardiac arrhythmia, unspecified: Secondary | ICD-10-CM | POA: Diagnosis not present

## 2019-06-13 DIAGNOSIS — R001 Bradycardia, unspecified: Secondary | ICD-10-CM | POA: Diagnosis not present

## 2019-06-13 DIAGNOSIS — G20 Parkinson's disease: Secondary | ICD-10-CM | POA: Diagnosis not present

## 2019-06-13 DIAGNOSIS — R55 Syncope and collapse: Secondary | ICD-10-CM | POA: Diagnosis not present

## 2019-06-13 DIAGNOSIS — M25551 Pain in right hip: Secondary | ICD-10-CM | POA: Diagnosis not present

## 2019-06-13 DIAGNOSIS — Z8673 Personal history of transient ischemic attack (TIA), and cerebral infarction without residual deficits: Secondary | ICD-10-CM | POA: Diagnosis not present

## 2019-06-13 DIAGNOSIS — S299XXA Unspecified injury of thorax, initial encounter: Secondary | ICD-10-CM | POA: Diagnosis not present

## 2019-06-24 DIAGNOSIS — M79672 Pain in left foot: Secondary | ICD-10-CM | POA: Diagnosis not present

## 2019-06-24 DIAGNOSIS — M79671 Pain in right foot: Secondary | ICD-10-CM | POA: Diagnosis not present

## 2019-06-24 DIAGNOSIS — B351 Tinea unguium: Secondary | ICD-10-CM | POA: Diagnosis not present

## 2019-06-24 DIAGNOSIS — L603 Nail dystrophy: Secondary | ICD-10-CM | POA: Diagnosis not present

## 2019-06-24 DIAGNOSIS — S90212A Contusion of left great toe with damage to nail, initial encounter: Secondary | ICD-10-CM | POA: Diagnosis not present

## 2019-07-10 DIAGNOSIS — S72001A Fracture of unspecified part of neck of right femur, initial encounter for closed fracture: Secondary | ICD-10-CM | POA: Diagnosis not present

## 2019-07-10 DIAGNOSIS — Z471 Aftercare following joint replacement surgery: Secondary | ICD-10-CM | POA: Diagnosis not present

## 2019-08-02 ENCOUNTER — Encounter: Payer: Self-pay | Admitting: Neurology

## 2019-08-02 ENCOUNTER — Ambulatory Visit: Payer: Medicare HMO | Admitting: Neurology

## 2020-04-01 ENCOUNTER — Other Ambulatory Visit: Payer: Self-pay | Admitting: Cardiology

## 2020-04-06 ENCOUNTER — Other Ambulatory Visit: Payer: Self-pay | Admitting: Cardiology

## 2020-04-26 ENCOUNTER — Other Ambulatory Visit: Payer: Self-pay | Admitting: Cardiology

## 2020-05-12 ENCOUNTER — Other Ambulatory Visit: Payer: Self-pay | Admitting: Cardiology

## 2020-11-05 ENCOUNTER — Other Ambulatory Visit: Payer: Self-pay | Admitting: Cardiology

## 2020-11-06 NOTE — Telephone Encounter (Signed)
77m, 83.9kg, scr 1.2 04/11/20, lovw/jordan 03/16/19
